# Patient Record
Sex: Female | Born: 1955 | Race: White | Hispanic: No | Smoking: Former smoker
Health system: Southern US, Community
[De-identification: ages and names within clinical notes are randomized; demographics above are authoritative.]

## PROBLEM LIST (undated history)

## (undated) DIAGNOSIS — Z8489 Family history of other specified conditions: Secondary | ICD-10-CM

## (undated) DIAGNOSIS — M545 Low back pain, unspecified: Secondary | ICD-10-CM

## (undated) DIAGNOSIS — L0390 Cellulitis, unspecified: Secondary | ICD-10-CM

## (undated) DIAGNOSIS — K219 Gastro-esophageal reflux disease without esophagitis: Secondary | ICD-10-CM

## (undated) DIAGNOSIS — I872 Venous insufficiency (chronic) (peripheral): Secondary | ICD-10-CM

## (undated) DIAGNOSIS — M79606 Pain in leg, unspecified: Secondary | ICD-10-CM

## (undated) DIAGNOSIS — I739 Peripheral vascular disease, unspecified: Secondary | ICD-10-CM

## (undated) DIAGNOSIS — E079 Disorder of thyroid, unspecified: Secondary | ICD-10-CM

## (undated) DIAGNOSIS — J45909 Unspecified asthma, uncomplicated: Secondary | ICD-10-CM

## (undated) DIAGNOSIS — M1990 Unspecified osteoarthritis, unspecified site: Secondary | ICD-10-CM

## (undated) DIAGNOSIS — Z9289 Personal history of other medical treatment: Secondary | ICD-10-CM

## (undated) DIAGNOSIS — I2699 Other pulmonary embolism without acute cor pulmonale: Secondary | ICD-10-CM

## (undated) DIAGNOSIS — D649 Anemia, unspecified: Secondary | ICD-10-CM

## (undated) DIAGNOSIS — F419 Anxiety disorder, unspecified: Secondary | ICD-10-CM

## (undated) DIAGNOSIS — E7211 Homocystinuria: Secondary | ICD-10-CM

## (undated) DIAGNOSIS — R7983 Abnormal findings of blood amino-acid level: Secondary | ICD-10-CM

## (undated) DIAGNOSIS — T403X5A Adverse effect of methadone, initial encounter: Secondary | ICD-10-CM

## (undated) DIAGNOSIS — K5730 Diverticulosis of large intestine without perforation or abscess without bleeding: Secondary | ICD-10-CM

## (undated) DIAGNOSIS — I82409 Acute embolism and thrombosis of unspecified deep veins of unspecified lower extremity: Secondary | ICD-10-CM

## (undated) DIAGNOSIS — F17200 Nicotine dependence, unspecified, uncomplicated: Secondary | ICD-10-CM

## (undated) DIAGNOSIS — D126 Benign neoplasm of colon, unspecified: Secondary | ICD-10-CM

## (undated) DIAGNOSIS — I959 Hypotension, unspecified: Secondary | ICD-10-CM

## (undated) DIAGNOSIS — E876 Hypokalemia: Secondary | ICD-10-CM

## (undated) DIAGNOSIS — G8929 Other chronic pain: Secondary | ICD-10-CM

## (undated) DIAGNOSIS — R32 Unspecified urinary incontinence: Secondary | ICD-10-CM

## (undated) DIAGNOSIS — I2510 Atherosclerotic heart disease of native coronary artery without angina pectoris: Secondary | ICD-10-CM

## (undated) DIAGNOSIS — I4510 Unspecified right bundle-branch block: Secondary | ICD-10-CM

## (undated) DIAGNOSIS — D479 Neoplasm of uncertain behavior of lymphoid, hematopoietic and related tissue, unspecified: Secondary | ICD-10-CM

## (undated) DIAGNOSIS — F32A Depression, unspecified: Secondary | ICD-10-CM

## (undated) DIAGNOSIS — F329 Major depressive disorder, single episode, unspecified: Secondary | ICD-10-CM

## (undated) DIAGNOSIS — S301XXA Contusion of abdominal wall, initial encounter: Secondary | ICD-10-CM

## (undated) HISTORY — DX: Venous insufficiency (chronic) (peripheral): I87.2

## (undated) HISTORY — DX: Disorder of thyroid, unspecified: E07.9

## (undated) HISTORY — DX: Hypokalemia: E87.6

## (undated) HISTORY — DX: Low back pain: M54.5

## (undated) HISTORY — DX: Homocystinuria: E72.11

## (undated) HISTORY — DX: Other chronic pain: G89.29

## (undated) HISTORY — PX: GASTRIC BYPASS: SHX52

## (undated) HISTORY — DX: Other pulmonary embolism without acute cor pulmonale: I26.99

## (undated) HISTORY — DX: Diverticulosis of large intestine without perforation or abscess without bleeding: K57.30

## (undated) HISTORY — DX: Cellulitis, unspecified: L03.90

## (undated) HISTORY — DX: Hypotension, unspecified: I95.9

## (undated) HISTORY — PX: CHOLECYSTECTOMY OPEN: SUR202

## (undated) HISTORY — DX: Contusion of abdominal wall, initial encounter: S30.1XXA

## (undated) HISTORY — DX: Unspecified right bundle-branch block: I45.10

## (undated) HISTORY — DX: Pain in leg, unspecified: M79.606

## (undated) HISTORY — DX: Adverse effect of methadone, initial encounter: T40.3X5A

## (undated) HISTORY — DX: Anemia, unspecified: D64.9

## (undated) HISTORY — DX: Nicotine dependence, unspecified, uncomplicated: F17.200

## (undated) HISTORY — DX: Low back pain, unspecified: M54.50

## (undated) HISTORY — DX: Abnormal findings of blood amino-acid level: R79.83

## (undated) HISTORY — DX: Neoplasm of uncertain behavior of lymphoid, hematopoietic and related tissue, unspecified: D47.9

## (undated) HISTORY — DX: Atherosclerotic heart disease of native coronary artery without angina pectoris: I25.10

## (undated) HISTORY — PX: JOINT REPLACEMENT: SHX530

## (undated) HISTORY — DX: Benign neoplasm of colon, unspecified: D12.6

## (undated) HISTORY — DX: Peripheral vascular disease, unspecified: I73.9

## (undated) HISTORY — PX: TONSILLECTOMY: SUR1361

---

## 1998-09-04 ENCOUNTER — Emergency Department (HOSPITAL_COMMUNITY): Admission: EM | Admit: 1998-09-04 | Discharge: 1998-09-04 | Payer: Self-pay | Admitting: Emergency Medicine

## 1999-06-21 ENCOUNTER — Emergency Department (HOSPITAL_COMMUNITY): Admission: EM | Admit: 1999-06-21 | Discharge: 1999-06-21 | Payer: Self-pay | Admitting: Internal Medicine

## 2000-01-18 ENCOUNTER — Encounter: Payer: Self-pay | Admitting: Emergency Medicine

## 2000-01-18 ENCOUNTER — Emergency Department (HOSPITAL_COMMUNITY): Admission: EM | Admit: 2000-01-18 | Discharge: 2000-01-18 | Payer: Self-pay | Admitting: Emergency Medicine

## 2000-02-29 ENCOUNTER — Other Ambulatory Visit: Admission: RE | Admit: 2000-02-29 | Discharge: 2000-02-29 | Payer: Self-pay | Admitting: Orthopedic Surgery

## 2000-03-25 ENCOUNTER — Emergency Department (HOSPITAL_COMMUNITY): Admission: EM | Admit: 2000-03-25 | Discharge: 2000-03-25 | Payer: Self-pay | Admitting: Emergency Medicine

## 2000-05-11 ENCOUNTER — Emergency Department (HOSPITAL_COMMUNITY): Admission: EM | Admit: 2000-05-11 | Discharge: 2000-05-12 | Payer: Self-pay | Admitting: Emergency Medicine

## 2000-05-12 ENCOUNTER — Ambulatory Visit (HOSPITAL_COMMUNITY): Admission: RE | Admit: 2000-05-12 | Discharge: 2000-05-12 | Payer: Self-pay | Admitting: *Deleted

## 2000-05-12 ENCOUNTER — Encounter: Payer: Self-pay | Admitting: Emergency Medicine

## 2000-05-12 ENCOUNTER — Encounter: Admission: RE | Admit: 2000-05-12 | Discharge: 2000-05-12 | Payer: Self-pay | Admitting: Internal Medicine

## 2000-06-05 ENCOUNTER — Encounter: Payer: Self-pay | Admitting: Orthopedic Surgery

## 2000-06-05 ENCOUNTER — Ambulatory Visit (HOSPITAL_COMMUNITY): Admission: RE | Admit: 2000-06-05 | Discharge: 2000-06-05 | Payer: Self-pay | Admitting: Orthopedic Surgery

## 2001-01-02 ENCOUNTER — Other Ambulatory Visit: Admission: RE | Admit: 2001-01-02 | Discharge: 2001-01-02 | Payer: Self-pay | Admitting: Gynecology

## 2001-01-19 ENCOUNTER — Other Ambulatory Visit: Admission: RE | Admit: 2001-01-19 | Discharge: 2001-01-19 | Payer: Self-pay | Admitting: Gynecology

## 2001-01-19 ENCOUNTER — Encounter (INDEPENDENT_AMBULATORY_CARE_PROVIDER_SITE_OTHER): Payer: Self-pay

## 2001-01-24 ENCOUNTER — Other Ambulatory Visit: Admission: RE | Admit: 2001-01-24 | Discharge: 2001-01-24 | Payer: Self-pay | Admitting: Gynecology

## 2001-01-24 ENCOUNTER — Encounter (INDEPENDENT_AMBULATORY_CARE_PROVIDER_SITE_OTHER): Payer: Self-pay

## 2001-02-11 ENCOUNTER — Emergency Department (HOSPITAL_COMMUNITY): Admission: EM | Admit: 2001-02-11 | Discharge: 2001-02-11 | Payer: Self-pay | Admitting: Emergency Medicine

## 2001-02-11 ENCOUNTER — Encounter: Payer: Self-pay | Admitting: Emergency Medicine

## 2001-02-12 ENCOUNTER — Encounter: Admission: RE | Admit: 2001-02-12 | Discharge: 2001-02-12 | Payer: Self-pay | Admitting: Family Medicine

## 2001-02-12 ENCOUNTER — Encounter: Payer: Self-pay | Admitting: Family Medicine

## 2001-05-08 HISTORY — PX: KNEE ARTHROSCOPY: SHX127

## 2001-05-31 ENCOUNTER — Ambulatory Visit (HOSPITAL_COMMUNITY): Admission: RE | Admit: 2001-05-31 | Discharge: 2001-05-31 | Payer: Self-pay | Admitting: Orthopedic Surgery

## 2002-03-30 ENCOUNTER — Emergency Department (HOSPITAL_COMMUNITY): Admission: EM | Admit: 2002-03-30 | Discharge: 2002-03-30 | Payer: Self-pay | Admitting: Emergency Medicine

## 2002-06-14 ENCOUNTER — Emergency Department (HOSPITAL_COMMUNITY): Admission: EM | Admit: 2002-06-14 | Discharge: 2002-06-14 | Payer: Self-pay | Admitting: Emergency Medicine

## 2002-07-06 ENCOUNTER — Emergency Department (HOSPITAL_COMMUNITY): Admission: EM | Admit: 2002-07-06 | Discharge: 2002-07-06 | Payer: Self-pay | Admitting: Emergency Medicine

## 2002-07-06 ENCOUNTER — Encounter: Payer: Self-pay | Admitting: Emergency Medicine

## 2002-08-08 HISTORY — PX: SHOULDER ARTHROSCOPY W/ ROTATOR CUFF REPAIR: SHX2400

## 2002-09-06 ENCOUNTER — Ambulatory Visit (HOSPITAL_BASED_OUTPATIENT_CLINIC_OR_DEPARTMENT_OTHER): Admission: RE | Admit: 2002-09-06 | Discharge: 2002-09-07 | Payer: Self-pay | Admitting: Orthopedic Surgery

## 2003-01-12 ENCOUNTER — Emergency Department (HOSPITAL_COMMUNITY): Admission: EM | Admit: 2003-01-12 | Discharge: 2003-01-12 | Payer: Self-pay | Admitting: Emergency Medicine

## 2003-02-05 ENCOUNTER — Other Ambulatory Visit: Admission: RE | Admit: 2003-02-05 | Discharge: 2003-02-05 | Payer: Self-pay | Admitting: Gynecology

## 2003-02-14 ENCOUNTER — Encounter: Payer: Self-pay | Admitting: Gynecology

## 2003-02-14 ENCOUNTER — Ambulatory Visit (HOSPITAL_COMMUNITY): Admission: RE | Admit: 2003-02-14 | Discharge: 2003-02-14 | Payer: Self-pay | Admitting: Gynecology

## 2003-10-03 ENCOUNTER — Emergency Department (HOSPITAL_COMMUNITY): Admission: EM | Admit: 2003-10-03 | Discharge: 2003-10-04 | Payer: Self-pay | Admitting: Emergency Medicine

## 2003-10-09 ENCOUNTER — Emergency Department (HOSPITAL_COMMUNITY): Admission: EM | Admit: 2003-10-09 | Discharge: 2003-10-09 | Payer: Self-pay | Admitting: Emergency Medicine

## 2003-12-01 ENCOUNTER — Emergency Department (HOSPITAL_COMMUNITY): Admission: EM | Admit: 2003-12-01 | Discharge: 2003-12-02 | Payer: Self-pay | Admitting: *Deleted

## 2004-11-02 ENCOUNTER — Emergency Department (HOSPITAL_COMMUNITY): Admission: EM | Admit: 2004-11-02 | Discharge: 2004-11-02 | Payer: Self-pay | Admitting: Family Medicine

## 2004-11-07 ENCOUNTER — Emergency Department (HOSPITAL_COMMUNITY): Admission: EM | Admit: 2004-11-07 | Discharge: 2004-11-07 | Payer: Self-pay | Admitting: Family Medicine

## 2004-12-08 ENCOUNTER — Other Ambulatory Visit: Admission: RE | Admit: 2004-12-08 | Discharge: 2004-12-08 | Payer: Self-pay | Admitting: Gynecology

## 2006-02-08 ENCOUNTER — Emergency Department (HOSPITAL_COMMUNITY): Admission: EM | Admit: 2006-02-08 | Discharge: 2006-02-08 | Payer: Self-pay | Admitting: Emergency Medicine

## 2006-08-08 DIAGNOSIS — I2699 Other pulmonary embolism without acute cor pulmonale: Secondary | ICD-10-CM

## 2006-08-08 HISTORY — DX: Other pulmonary embolism without acute cor pulmonale: I26.99

## 2007-05-09 HISTORY — PX: CARDIAC CATHETERIZATION: SHX172

## 2007-05-20 ENCOUNTER — Ambulatory Visit: Payer: Self-pay | Admitting: Cardiology

## 2007-05-21 ENCOUNTER — Ambulatory Visit: Payer: Self-pay | Admitting: Cardiology

## 2007-05-21 ENCOUNTER — Ambulatory Visit: Payer: Self-pay | Admitting: Internal Medicine

## 2007-05-21 ENCOUNTER — Inpatient Hospital Stay (HOSPITAL_COMMUNITY): Admission: EM | Admit: 2007-05-21 | Discharge: 2007-05-29 | Payer: Self-pay | Admitting: Cardiovascular Disease

## 2007-05-22 ENCOUNTER — Encounter (INDEPENDENT_AMBULATORY_CARE_PROVIDER_SITE_OTHER): Payer: Self-pay | Admitting: Internal Medicine

## 2007-05-22 ENCOUNTER — Ambulatory Visit: Payer: Self-pay | Admitting: Vascular Surgery

## 2007-07-13 ENCOUNTER — Ambulatory Visit: Payer: Self-pay | Admitting: Cardiology

## 2007-07-18 ENCOUNTER — Emergency Department (HOSPITAL_COMMUNITY): Admission: EM | Admit: 2007-07-18 | Discharge: 2007-07-19 | Payer: Self-pay | Admitting: Emergency Medicine

## 2007-07-19 ENCOUNTER — Ambulatory Visit: Payer: Self-pay | Admitting: Cardiology

## 2007-07-22 ENCOUNTER — Emergency Department (HOSPITAL_COMMUNITY): Admission: EM | Admit: 2007-07-22 | Discharge: 2007-07-22 | Payer: Self-pay | Admitting: Emergency Medicine

## 2007-07-26 ENCOUNTER — Ambulatory Visit: Payer: Self-pay | Admitting: Cardiology

## 2007-07-30 ENCOUNTER — Ambulatory Visit: Payer: Self-pay | Admitting: Cardiology

## 2007-07-30 LAB — CONVERTED CEMR LAB
INR: 1.2 — ABNORMAL HIGH (ref 0.8–1.0)
Prothrombin Time: 13.5 s — ABNORMAL HIGH (ref 10.9–13.3)

## 2007-08-07 ENCOUNTER — Ambulatory Visit: Payer: Self-pay | Admitting: Cardiology

## 2007-08-07 LAB — CONVERTED CEMR LAB
INR: 1.4 — ABNORMAL HIGH (ref 0.8–1.0)
Prothrombin Time: 14.3 s — ABNORMAL HIGH (ref 10.9–13.3)
Prothrombin Time: 14.3 s — ABNORMAL HIGH (ref 10.9–13.3)

## 2007-08-10 ENCOUNTER — Ambulatory Visit: Payer: Self-pay | Admitting: Pulmonary Disease

## 2007-08-10 ENCOUNTER — Ambulatory Visit: Payer: Self-pay | Admitting: Cardiology

## 2007-08-10 DIAGNOSIS — I959 Hypotension, unspecified: Secondary | ICD-10-CM | POA: Insufficient documentation

## 2007-08-10 DIAGNOSIS — F17200 Nicotine dependence, unspecified, uncomplicated: Secondary | ICD-10-CM | POA: Insufficient documentation

## 2007-08-10 DIAGNOSIS — J439 Emphysema, unspecified: Secondary | ICD-10-CM | POA: Insufficient documentation

## 2007-08-10 DIAGNOSIS — D649 Anemia, unspecified: Secondary | ICD-10-CM | POA: Insufficient documentation

## 2007-08-10 DIAGNOSIS — M545 Low back pain, unspecified: Secondary | ICD-10-CM | POA: Insufficient documentation

## 2007-08-10 DIAGNOSIS — R599 Enlarged lymph nodes, unspecified: Secondary | ICD-10-CM | POA: Insufficient documentation

## 2007-08-10 DIAGNOSIS — E876 Hypokalemia: Secondary | ICD-10-CM | POA: Insufficient documentation

## 2007-08-10 DIAGNOSIS — I951 Orthostatic hypotension: Secondary | ICD-10-CM | POA: Insufficient documentation

## 2007-08-10 DIAGNOSIS — Z86711 Personal history of pulmonary embolism: Secondary | ICD-10-CM | POA: Insufficient documentation

## 2007-08-10 LAB — CONVERTED CEMR LAB: INR: 1.4 — ABNORMAL HIGH (ref 0.8–1.0)

## 2007-08-13 ENCOUNTER — Ambulatory Visit: Payer: Self-pay | Admitting: Cardiology

## 2007-08-17 ENCOUNTER — Ambulatory Visit: Payer: Self-pay | Admitting: Cardiology

## 2007-08-31 ENCOUNTER — Ambulatory Visit: Payer: Self-pay | Admitting: Internal Medicine

## 2007-09-18 ENCOUNTER — Ambulatory Visit: Payer: Self-pay | Admitting: Cardiovascular Disease

## 2007-09-26 ENCOUNTER — Ambulatory Visit (HOSPITAL_COMMUNITY): Admission: RE | Admit: 2007-09-26 | Discharge: 2007-09-26 | Payer: Self-pay | Admitting: Pulmonary Disease

## 2007-10-03 ENCOUNTER — Ambulatory Visit: Payer: Self-pay | Admitting: Internal Medicine

## 2007-10-15 ENCOUNTER — Ambulatory Visit: Payer: Self-pay | Admitting: Cardiology

## 2007-10-15 ENCOUNTER — Ambulatory Visit: Payer: Self-pay | Admitting: Pulmonary Disease

## 2007-11-01 ENCOUNTER — Ambulatory Visit: Payer: Self-pay | Admitting: Internal Medicine

## 2007-11-07 ENCOUNTER — Other Ambulatory Visit: Admission: RE | Admit: 2007-11-07 | Discharge: 2007-11-07 | Payer: Self-pay | Admitting: Gynecology

## 2007-11-07 ENCOUNTER — Ambulatory Visit: Payer: Self-pay | Admitting: Internal Medicine

## 2007-11-15 ENCOUNTER — Ambulatory Visit: Payer: Self-pay | Admitting: Internal Medicine

## 2007-11-26 ENCOUNTER — Ambulatory Visit: Payer: Self-pay | Admitting: Cardiology

## 2007-11-29 ENCOUNTER — Ambulatory Visit: Payer: Self-pay | Admitting: Gastroenterology

## 2007-11-29 LAB — CONVERTED CEMR LAB
ALT: 19 units/L (ref 0–35)
Alkaline Phosphatase: 61 units/L (ref 39–117)
Bilirubin, Direct: 0.1 mg/dL (ref 0.0–0.3)
Total Bilirubin: 0.6 mg/dL (ref 0.3–1.2)

## 2007-12-07 DIAGNOSIS — D126 Benign neoplasm of colon, unspecified: Secondary | ICD-10-CM

## 2007-12-07 HISTORY — DX: Benign neoplasm of colon, unspecified: D12.6

## 2007-12-10 ENCOUNTER — Ambulatory Visit: Payer: Self-pay | Admitting: Internal Medicine

## 2007-12-18 ENCOUNTER — Ambulatory Visit: Payer: Self-pay | Admitting: Cardiovascular Disease

## 2007-12-21 ENCOUNTER — Emergency Department (HOSPITAL_COMMUNITY): Admission: EM | Admit: 2007-12-21 | Discharge: 2007-12-21 | Payer: Self-pay | Admitting: Emergency Medicine

## 2007-12-24 ENCOUNTER — Telehealth: Payer: Self-pay | Admitting: Gastroenterology

## 2007-12-25 ENCOUNTER — Ambulatory Visit: Payer: Self-pay | Admitting: Gastroenterology

## 2007-12-25 ENCOUNTER — Encounter: Payer: Self-pay | Admitting: Gastroenterology

## 2007-12-27 ENCOUNTER — Encounter: Payer: Self-pay | Admitting: Gastroenterology

## 2008-01-01 ENCOUNTER — Inpatient Hospital Stay (HOSPITAL_COMMUNITY): Admission: EM | Admit: 2008-01-01 | Discharge: 2008-01-02 | Payer: Self-pay | Admitting: Emergency Medicine

## 2008-01-01 ENCOUNTER — Ambulatory Visit: Payer: Self-pay | Admitting: Cardiology

## 2008-01-03 ENCOUNTER — Telehealth: Payer: Self-pay | Admitting: Pulmonary Disease

## 2008-01-04 ENCOUNTER — Ambulatory Visit: Payer: Self-pay | Admitting: Cardiology

## 2008-01-07 ENCOUNTER — Inpatient Hospital Stay (HOSPITAL_COMMUNITY): Admission: RE | Admit: 2008-01-07 | Discharge: 2008-01-11 | Payer: Self-pay | Admitting: Orthopedic Surgery

## 2008-01-07 DIAGNOSIS — Z9289 Personal history of other medical treatment: Secondary | ICD-10-CM

## 2008-01-07 HISTORY — DX: Personal history of other medical treatment: Z92.89

## 2008-01-07 HISTORY — PX: TOTAL HIP ARTHROPLASTY: SHX124

## 2008-01-15 ENCOUNTER — Telehealth: Payer: Self-pay | Admitting: Gastroenterology

## 2008-02-12 ENCOUNTER — Ambulatory Visit: Payer: Self-pay | Admitting: Cardiovascular Disease

## 2008-02-27 ENCOUNTER — Ambulatory Visit: Payer: Self-pay | Admitting: Internal Medicine

## 2008-03-05 ENCOUNTER — Ambulatory Visit: Payer: Self-pay | Admitting: Cardiology

## 2008-03-14 ENCOUNTER — Ambulatory Visit: Payer: Self-pay | Admitting: Cardiology

## 2008-03-24 ENCOUNTER — Ambulatory Visit: Payer: Self-pay | Admitting: Cardiology

## 2008-04-17 ENCOUNTER — Ambulatory Visit: Payer: Self-pay | Admitting: Cardiology

## 2008-04-24 ENCOUNTER — Ambulatory Visit: Payer: Self-pay | Admitting: Cardiology

## 2008-05-08 ENCOUNTER — Ambulatory Visit: Payer: Self-pay | Admitting: Internal Medicine

## 2008-05-28 ENCOUNTER — Ambulatory Visit: Payer: Self-pay | Admitting: Cardiology

## 2008-06-17 ENCOUNTER — Ambulatory Visit: Payer: Self-pay | Admitting: Internal Medicine

## 2008-07-25 ENCOUNTER — Ambulatory Visit: Payer: Self-pay | Admitting: Cardiovascular Disease

## 2008-08-20 ENCOUNTER — Emergency Department (HOSPITAL_COMMUNITY): Admission: EM | Admit: 2008-08-20 | Discharge: 2008-08-20 | Payer: Self-pay | Admitting: Emergency Medicine

## 2008-10-22 ENCOUNTER — Ambulatory Visit: Payer: Self-pay | Admitting: Cardiology

## 2008-10-22 ENCOUNTER — Ambulatory Visit: Payer: Self-pay

## 2008-10-22 ENCOUNTER — Encounter (INDEPENDENT_AMBULATORY_CARE_PROVIDER_SITE_OTHER): Payer: Self-pay | Admitting: Orthopedic Surgery

## 2008-11-09 ENCOUNTER — Emergency Department (HOSPITAL_COMMUNITY): Admission: EM | Admit: 2008-11-09 | Discharge: 2008-11-09 | Payer: Self-pay | Admitting: Emergency Medicine

## 2008-11-28 ENCOUNTER — Ambulatory Visit: Payer: Self-pay | Admitting: Cardiology

## 2008-12-12 ENCOUNTER — Ambulatory Visit: Payer: Self-pay | Admitting: Cardiovascular Disease

## 2008-12-12 LAB — CONVERTED CEMR LAB: INR: 7 (ref 0.8–1.0)

## 2008-12-19 ENCOUNTER — Ambulatory Visit: Payer: Self-pay | Admitting: Cardiology

## 2008-12-26 ENCOUNTER — Ambulatory Visit: Payer: Self-pay | Admitting: Cardiology

## 2009-01-07 ENCOUNTER — Encounter: Payer: Self-pay | Admitting: *Deleted

## 2009-01-09 ENCOUNTER — Ambulatory Visit: Payer: Self-pay | Admitting: Cardiology

## 2009-01-19 ENCOUNTER — Ambulatory Visit: Payer: Self-pay | Admitting: Cardiology

## 2009-01-19 LAB — CONVERTED CEMR LAB
POC INR: 1.5
Protime: 15

## 2009-01-26 ENCOUNTER — Ambulatory Visit: Payer: Self-pay | Admitting: Internal Medicine

## 2009-01-26 ENCOUNTER — Encounter (INDEPENDENT_AMBULATORY_CARE_PROVIDER_SITE_OTHER): Payer: Self-pay | Admitting: *Deleted

## 2009-01-26 LAB — CONVERTED CEMR LAB: POC INR: 3.3

## 2009-02-09 ENCOUNTER — Emergency Department (HOSPITAL_COMMUNITY): Admission: EM | Admit: 2009-02-09 | Discharge: 2009-02-10 | Payer: Self-pay | Admitting: Emergency Medicine

## 2009-02-10 ENCOUNTER — Ambulatory Visit: Payer: Self-pay | Admitting: Cardiology

## 2009-02-10 ENCOUNTER — Encounter (INDEPENDENT_AMBULATORY_CARE_PROVIDER_SITE_OTHER): Payer: Self-pay | Admitting: Cardiology

## 2009-02-10 LAB — CONVERTED CEMR LAB: POC INR: 2.4

## 2009-02-11 ENCOUNTER — Encounter: Payer: Self-pay | Admitting: *Deleted

## 2009-03-04 ENCOUNTER — Ambulatory Visit: Payer: Self-pay | Admitting: Cardiology

## 2009-03-18 ENCOUNTER — Ambulatory Visit: Payer: Self-pay | Admitting: Internal Medicine

## 2009-03-18 ENCOUNTER — Encounter (INDEPENDENT_AMBULATORY_CARE_PROVIDER_SITE_OTHER): Payer: Self-pay | Admitting: Cardiology

## 2009-03-18 LAB — CONVERTED CEMR LAB: POC INR: 5

## 2009-04-01 ENCOUNTER — Ambulatory Visit: Payer: Self-pay | Admitting: Internal Medicine

## 2009-04-01 LAB — CONVERTED CEMR LAB: POC INR: 2.2

## 2009-04-15 ENCOUNTER — Ambulatory Visit: Payer: Self-pay | Admitting: Cardiology

## 2009-04-15 LAB — CONVERTED CEMR LAB: POC INR: 4.2

## 2009-05-11 ENCOUNTER — Ambulatory Visit: Payer: Self-pay | Admitting: Internal Medicine

## 2009-05-11 LAB — CONVERTED CEMR LAB: POC INR: 2.1

## 2009-05-25 ENCOUNTER — Ambulatory Visit: Payer: Self-pay | Admitting: Cardiovascular Disease

## 2009-05-25 LAB — CONVERTED CEMR LAB: POC INR: 2.7

## 2009-06-15 ENCOUNTER — Ambulatory Visit: Payer: Self-pay | Admitting: Internal Medicine

## 2009-06-15 LAB — CONVERTED CEMR LAB: POC INR: 2.5

## 2009-07-06 ENCOUNTER — Ambulatory Visit: Payer: Self-pay | Admitting: Cardiology

## 2009-07-06 LAB — CONVERTED CEMR LAB: POC INR: 2.2

## 2009-08-12 ENCOUNTER — Ambulatory Visit: Payer: Self-pay | Admitting: Cardiovascular Disease

## 2009-08-12 LAB — CONVERTED CEMR LAB: POC INR: 2.7

## 2009-09-09 ENCOUNTER — Ambulatory Visit: Payer: Self-pay | Admitting: Internal Medicine

## 2009-10-12 ENCOUNTER — Ambulatory Visit: Payer: Self-pay | Admitting: Internal Medicine

## 2009-11-16 ENCOUNTER — Ambulatory Visit: Payer: Self-pay | Admitting: Cardiovascular Disease

## 2009-11-16 LAB — CONVERTED CEMR LAB: POC INR: 3.3

## 2009-12-07 ENCOUNTER — Ambulatory Visit: Payer: Self-pay | Admitting: Cardiology

## 2010-01-14 ENCOUNTER — Ambulatory Visit: Payer: Self-pay | Admitting: Internal Medicine

## 2010-01-18 ENCOUNTER — Encounter: Payer: Self-pay | Admitting: Physician Assistant

## 2010-01-18 ENCOUNTER — Ambulatory Visit: Payer: Self-pay | Admitting: Cardiology

## 2010-01-18 DIAGNOSIS — R600 Localized edema: Secondary | ICD-10-CM

## 2010-01-18 DIAGNOSIS — I890 Lymphedema, not elsewhere classified: Secondary | ICD-10-CM | POA: Insufficient documentation

## 2010-01-18 DIAGNOSIS — R252 Cramp and spasm: Secondary | ICD-10-CM | POA: Insufficient documentation

## 2010-01-19 DIAGNOSIS — E039 Hypothyroidism, unspecified: Secondary | ICD-10-CM | POA: Insufficient documentation

## 2010-01-20 LAB — CONVERTED CEMR LAB
ALT: 10 units/L (ref 0–35)
AST: 15 units/L (ref 0–37)
Albumin: 3.4 g/dL — ABNORMAL LOW (ref 3.5–5.2)
Alkaline Phosphatase: 68 units/L (ref 39–117)
BUN: 4 mg/dL — ABNORMAL LOW (ref 6–23)
Basophils Absolute: 0 10*3/uL (ref 0.0–0.1)
CO2: 34 meq/L — ABNORMAL HIGH (ref 19–32)
Chloride: 106 meq/L (ref 96–112)
GFR calc non Af Amer: 112.03 mL/min (ref >60)
Glucose, Bld: 77 mg/dL (ref 70–99)
HCT: 34.5 % — ABNORMAL LOW (ref 36.0–46.0)
Lymphs Abs: 1.3 10*3/uL (ref 0.7–4.0)
Monocytes Absolute: 0.3 10*3/uL (ref 0.1–1.0)
Monocytes Relative: 6.4 % (ref 3.0–12.0)
Neutrophils Relative %: 59.8 % (ref 43.0–77.0)
Platelets: 206 10*3/uL (ref 150.0–400.0)
Potassium: 4 meq/L (ref 3.5–5.1)
RDW: 14.8 % — ABNORMAL HIGH (ref 11.5–14.6)
Sodium: 144 meq/L (ref 135–145)

## 2010-01-22 ENCOUNTER — Ambulatory Visit: Payer: Self-pay | Admitting: Cardiology

## 2010-01-22 ENCOUNTER — Ambulatory Visit: Payer: Self-pay

## 2010-01-22 LAB — CONVERTED CEMR LAB: TSH: 7.53 microintl units/mL — ABNORMAL HIGH (ref 0.35–5.50)

## 2010-01-28 ENCOUNTER — Ambulatory Visit: Payer: Self-pay | Admitting: Internal Medicine

## 2010-01-28 LAB — CONVERTED CEMR LAB: POC INR: 1.5

## 2010-02-09 ENCOUNTER — Telehealth (INDEPENDENT_AMBULATORY_CARE_PROVIDER_SITE_OTHER): Payer: Self-pay | Admitting: *Deleted

## 2010-02-11 ENCOUNTER — Ambulatory Visit: Payer: Self-pay | Admitting: Cardiology

## 2010-02-11 ENCOUNTER — Ambulatory Visit: Payer: Self-pay | Admitting: Internal Medicine

## 2010-02-11 DIAGNOSIS — E0590 Thyrotoxicosis, unspecified without thyrotoxic crisis or storm: Secondary | ICD-10-CM | POA: Insufficient documentation

## 2010-02-17 ENCOUNTER — Ambulatory Visit: Payer: Self-pay | Admitting: Cardiology

## 2010-03-04 ENCOUNTER — Ambulatory Visit: Payer: Self-pay | Admitting: Internal Medicine

## 2010-03-25 ENCOUNTER — Ambulatory Visit: Payer: Self-pay | Admitting: Cardiovascular Disease

## 2010-03-25 LAB — CONVERTED CEMR LAB: POC INR: 3.3

## 2010-04-15 ENCOUNTER — Ambulatory Visit: Payer: Self-pay | Admitting: Internal Medicine

## 2010-04-15 LAB — CONVERTED CEMR LAB: POC INR: 2.6

## 2010-05-13 ENCOUNTER — Ambulatory Visit: Payer: Self-pay | Admitting: Internal Medicine

## 2010-06-10 ENCOUNTER — Ambulatory Visit: Payer: Self-pay | Admitting: Cardiology

## 2010-06-10 ENCOUNTER — Encounter: Payer: Self-pay | Admitting: Cardiology

## 2010-06-10 LAB — CONVERTED CEMR LAB: POC INR: 3.8

## 2010-06-23 ENCOUNTER — Encounter: Payer: Self-pay | Admitting: Cardiology

## 2010-06-24 ENCOUNTER — Ambulatory Visit: Payer: Self-pay | Admitting: Cardiology

## 2010-06-24 ENCOUNTER — Ambulatory Visit: Payer: Self-pay | Admitting: Cardiovascular Disease

## 2010-06-24 LAB — CONVERTED CEMR LAB: POC INR: 2.5

## 2010-06-25 ENCOUNTER — Emergency Department (HOSPITAL_COMMUNITY): Admission: EM | Admit: 2010-06-25 | Discharge: 2010-06-26 | Payer: Self-pay | Admitting: Emergency Medicine

## 2010-06-29 ENCOUNTER — Ambulatory Visit: Payer: Self-pay | Admitting: Cardiovascular Disease

## 2010-06-29 LAB — CONVERTED CEMR LAB: POC INR: 2.9

## 2010-07-27 ENCOUNTER — Ambulatory Visit: Payer: Self-pay | Admitting: Cardiology

## 2010-07-27 LAB — CONVERTED CEMR LAB: POC INR: 3.3

## 2010-08-17 ENCOUNTER — Ambulatory Visit: Admission: RE | Admit: 2010-08-17 | Discharge: 2010-08-17 | Payer: Self-pay | Source: Home / Self Care

## 2010-08-31 ENCOUNTER — Ambulatory Visit: Admit: 2010-08-31 | Payer: Self-pay

## 2010-09-05 LAB — CONVERTED CEMR LAB
CK-MB: 1 ng/mL (ref 0.3–4.0)
Free T4: 0.86 ng/dL (ref 0.80–1.80)
Prothrombin Time: 64.7 s (ref 10.9–13.3)

## 2010-09-09 NOTE — Medication Information (Signed)
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 no    Recent/future dental: no  Any missed doses?: no       Is patient compliant with meds? yes       Allergies: 1)  ! * Narcan 2)  ! Effexor 3)  ! Morphine  Anticoagulation Management History:      The patient is taking warfarin and comes in today for a routine follow up visit.  Negative risk factors for bleeding include an age less than 74 years old.  The bleeding index is 'low risk'.  Negative CHADS2 values include Age > 71 years old.  The start date was 07/09/2007.  Her last INR was 6.7 ratio.  Anticoagulation responsible provider: Daleen Squibb MD, Maisie Fus.  INR POC: 3.3.  Cuvette Lot#: [id number].  Exp: 08/2011.    Anticoagulation Management Assessment/Plan:      The patient's current anticoagulation dose is Warfarin sodium 5 mg tabs: Use as directed by Anticoagulation Clinic.  The target INR is 2.0-3.0.  The next INR is due 08/17/2010.  Anticoagulation instructions were given to patient.  Results were reviewed/authorized by Bethena Midget, RN, BSN.  She was notified by Bethena Midget, RN, BSN.         Prior Anticoagulation Instructions: INR 2.9 Continue previous dose of 2 tablets everyday except 1 tablet on Monday and Friday Recheck INR in 4 weeks  Current Anticoagulation Instructions: INR 3.3 Today take 1 pill, then resume 2 pills everyday except 1 pill on Mondays and Fridays. Recheck in 3 weeks.

## 2010-09-09 NOTE — Medication Information (Signed)
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 no    Recent/future dental: no  Any missed doses?: no       Is patient compliant with meds? yes       Allergies: 1)  ! * Narcan 2)  ! Effexor 3)  ! Morphine  Anticoagulation Management History:      The patient is taking warfarin and comes in today for a routine follow up visit.  Negative risk factors for bleeding include an age less than 90 years old.  The bleeding index is 'low risk'.  Negative CHADS2 values include Age > 75 years old.  The start date was 07/09/2007.  Her last INR was 6.7 ratio.  Anticoagulation responsible provider: Juanda Chance MD, Smitty Cords.  INR POC: 3.5.  Cuvette Lot#: [id number].  Exp: 01/2011.    Anticoagulation Management Assessment/Plan:      The patient's current anticoagulation dose is Warfarin sodium 5 mg tabs: Use as directed by Anticoagulation Clinic.  The target INR is 2.0-3.0.  The next INR is due 12/28/2009.  Anticoagulation instructions were given to patient.  Results were reviewed/authorized by Eda Keys, PharmD.  She was notified by Eda Keys.         Prior Anticoagulation Instructions: INR 3.3  Hold today's dose. Then resume same dose of 2 tablets daily (10 mg) except 1 tablet on Mondays and Fridays.   Recheck in 3 weeks: Monday, May 2nd at 12:00 pm.   Current Anticoagulation Instructions: INR 3.5  Do NOT take coumadin today.  Then return to normal dosing schedule of 1 tablet on Monday and Friday and 2 tablets all other days.  Return to clinic in 3 weeks.   Prescriptions: WARFARIN SODIUM 5 MG TABS  (WARFARIN SODIUM) Use as directed by Anticoagulation Clinic  #60 Tablet x 2   Entered by:   Eda Keys   Authorized by:   Talitha Givens, MD, [facility]   Signed by:   Eda Keys on 12/07/2009   Method used:   Electronically to        [facility] (retail)       [address]       [place], [place]  [address]       Ph: [phone] or [phone]       Fax: [phone]   RxID:   [id number]

## 2010-09-09 NOTE — Medication Information (Signed)
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  Has been present since last week.   Recent/future hospitalizations: no    Any changes in medication regimen? yes       Details: Pt took Noroc  Recent/future dental: no  Any missed doses?: no       Is patient compliant with meds? yes       Allergies: 1)  ! * Narcan 2)  ! Effexor 3)  ! Morphine  Anticoagulation Management History:      The patient is taking warfarin and comes in today for a routine follow up visit.  Negative risk factors for bleeding include an age less than 4 years old.  The bleeding index is 'low risk'.  Negative CHADS2 values include Age > 84 years old.  The start date was 07/09/2007.  Her last INR was 6.7 ratio.  Anticoagulation responsible provider: Excell Seltzer MD, Casimiro Needle.  INR POC: 3.3.  Cuvette Lot#: [id number].  Exp: 05/2011.    Anticoagulation Management Assessment/Plan:      The patient's current anticoagulation dose is Warfarin sodium 5 mg tabs: Use as directed by Anticoagulation Clinic.  The target INR is 2.0-3.0.  The next INR is due 04/15/2010.  Anticoagulation instructions were given to patient.  Results were reviewed/authorized by Weston Brass, PharmD.  She was notified by Gweneth Fritter, PharmD Candidate.         Prior Anticoagulation Instructions: INR 2.5  Continue same dose of 2 tablet every day except 1 tablet on Monday and Friday.  Recheck INR in 3 weeks.   Current Anticoagulation Instructions: INR  Skip today's dose completely.  Then resume normal Couamdin Schedule of taking 2 tablets (10mg ) every day except take 1  tablet (5mg ) on Mondays and Fridays.  Recheck in 3 weeks.    Appended Document: rov/sp Informed pt to contact PCP if she continues to have bright red blood from rectum.  Also informed her that unfortunately, she cannot donate blood while on Coumadin Therapy.

## 2010-09-09 NOTE — Medication Information (Signed)
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 no    Recent/future dental: no  Any missed doses?: no       Is patient compliant with meds? yes      Comments: On 06/29/10 having Endometrial BX with Dr Chevis Pretty. INR only needs to be in range day of procedure. Saw Dr Myrtis Ser today  Allergies: 1)  ! * Narcan 2)  ! Effexor 3)  ! Morphine  Anticoagulation Management History:      The patient is taking warfarin and comes in today for a routine follow up visit.  Negative risk factors for bleeding include an age less than 41 years old.  The bleeding index is 'low risk'.  Negative CHADS2 values include Age > 12 years old.  The start date was 07/09/2007.  Her last INR was 6.7 ratio.  Anticoagulation responsible provider: Clifton James MD, Cristal Deer.  INR POC: 2.5.  Cuvette Lot#: [id number].  Exp: 06/2011.    Anticoagulation Management Assessment/Plan:      The patient's current anticoagulation dose is Warfarin sodium 5 mg tabs: Use as directed by Anticoagulation Clinic.  The target INR is 2.0-3.0.  The next INR is due 06/29/2010.  Anticoagulation instructions were given to patient.  Results were reviewed/authorized by Bethena Midget, RN, BSN.  She was notified by Bethena Midget, RN, BSN.         Prior Anticoagulation Instructions: INR 3.8 Skip today's dose then resume 2 pills everyday except 1 pill on Mondays and Fridays. Recheck in 2 weeks.   Current Anticoagulation Instructions: INR 2.5 Continue 2 pills everyday except 1 pill on Mondays and Fridays.  Recheck in one week on 06/29/10  before procedure.

## 2010-09-09 NOTE — Progress Notes (Signed)
Summary: nose bleeding   Phone Note Call from Patient Call back at [facility] Phone [phone]   Caller: Patient Summary of Call:  Pt nose bleeding  Initial call taken by: Judie Grieve,  February 09, 2010 11:56 AM  Follow-up for Phone Call        Pt states this occured off and on yesterday. She states it wasn't pouring out but just occ. dripping. Encouraged ice pack, pressure and vaseline in nasal membrane if it returns. keep appt for Thursday.  Follow-up by: Bethena Midget, RN, BSN,  February 09, 2010 3:33 PM

## 2010-09-09 NOTE — Medication Information (Signed)
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 no    Recent/future dental: no  Any missed doses?: no       Is patient compliant with meds? yes       Allergies: 1)  ! * Narcan 2)  ! Effexor 3)  ! Morphine  Anticoagulation Management History:      The patient is taking warfarin and comes in today for a routine follow up visit.  Negative risk factors for bleeding include an age less than 94 years old.  The bleeding index is 'low risk'.  Negative CHADS2 values include Age > 110 years old.  The start date was 07/09/2007.  Her last INR was 6.7 ratio.  Anticoagulation responsible provider: Allred MD, Fayrene Fearing.  INR POC: 2.7.  Cuvette Lot#: [id number].  Exp: 06/2011.    Anticoagulation Management Assessment/Plan:      The patient's current anticoagulation dose is Warfarin sodium 5 mg tabs: Use as directed by Anticoagulation Clinic.  The target INR is 2.0-3.0.  The next INR is due 06/10/2010.  Anticoagulation instructions were given to patient.  Results were reviewed/authorized by Weston Brass, PharmD.  She was notified by Ilean Skill D candidate.         Prior Anticoagulation Instructions: INR 2.6  Continue taking 2 tablets everyday except take 1 tablet on Mondays and Fridays. Re-check INR in 4 weeks.   Current Anticoagulation Instructions: INR 2.7  Continue taking 2 tablets everyday except 1 tablet on Monday and Friday. Recheck in 4 weeks.

## 2010-09-09 NOTE — Medication Information (Signed)
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 no    Recent/future dental: no  Any missed doses?: no       Is patient compliant with meds? yes       Allergies: 1)  ! * Narcan 2)  ! Effexor 3)  ! Morphine  Anticoagulation Management History:      The patient is taking warfarin and comes in today for a routine follow up visit.  Negative risk factors for bleeding include an age less than 25 years old.  The bleeding index is 'low risk'.  Negative CHADS2 values include Age > 27 years old.  The start date was 07/09/2007.  Her last INR was 6.7 ratio.  Anticoagulation responsible provider: Shan Valdes MD, Reuel Boom.  INR POC: 2.5.  Cuvette Lot#: [id number].  Exp: 05/2011.    Anticoagulation Management Assessment/Plan:      The patient's current anticoagulation dose is Warfarin sodium 5 mg tabs: Use as directed by Anticoagulation Clinic.  The target INR is 2.0-3.0.  The next INR is due 03/25/2010.  Anticoagulation instructions were given to patient.  Results were reviewed/authorized by Weston Brass, PharmD.  She was notified by Weston Brass PharmD.         Prior Anticoagulation Instructions: INR 3.2 Today only take 1 pill then resume 2 pills everyday except 1 pill on Mondays and Fridays. Recheck in 3 weeks.   Current Anticoagulation Instructions: INR 2.5  Continue same dose of 2 tablet every day except 1 tablet on Monday and Friday.  Recheck INR in 3 weeks.   Prescriptions: WARFARIN SODIUM 5 MG TABS (WARFARIN SODIUM) Use as directed by Anticoagulation Clinic  #60 Tablet x 2   Entered by:   Weston Brass PharmD   Authorized by:   Talitha Givens, MD, [facility]   Signed by:   Weston Brass PharmD on 03/04/2010   Method used:   Electronically to        [facility] [address] (retail)       [address]       [place], [place]  [id number]       Ph: [phone]       Fax: [phone]   RxID:   [id number]

## 2010-09-09 NOTE — Medication Information (Signed)
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 no    Recent/future dental: no  Any missed doses?: no       Is patient compliant with meds? yes       Allergies: 1)  ! * Narcan 2)  ! Effexor 3)  ! Morphine  Anticoagulation Management History:      The patient is taking warfarin and comes in today for a routine follow up visit.  Negative risk factors for bleeding include an age less than 71 years old.  The bleeding index is 'low risk'.  Negative CHADS2 values include Age > 62 years old.  The start date was 07/09/2007.  Her last INR was 6.7 ratio.  Anticoagulation responsible provider: Ladona Ridgel MD, Sharlot Gowda.  INR POC: 3.2.  Cuvette Lot#: [id number].  Exp: 09/20212.    Anticoagulation Management Assessment/Plan:      The patient's current anticoagulation dose is Warfarin sodium 5 mg tabs: Use as directed by Anticoagulation Clinic.  The target INR is 2.0-3.0.  The next INR is due 03/04/2010.  Anticoagulation instructions were given to patient.  Results were reviewed/authorized by Bethena Midget, RN, BSN.  She was notified by Bethena Midget, RN, BSN.         Prior Anticoagulation Instructions: INR 1.5 Today take 3 pills then change dose to 2 pills everyday except 1 pill on Mondays and Fridays. Recheck in 2 weeks.   Current Anticoagulation Instructions: INR 3.2 Today only take 1 pill then resume 2 pills everyday except 1 pill on Mondays and Fridays. Recheck in 3 weeks.

## 2010-09-09 NOTE — Medication Information (Signed)
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 no    Recent/future dental: no  Any missed doses?: no       Is patient compliant with meds? yes       Allergies: 1)  ! * Narcan 2)  ! Effexor 3)  ! Morphine  Anticoagulation Management History:      The patient is taking warfarin and comes in today for a routine follow up visit.  Negative risk factors for bleeding include an age less than 25 years old.  The bleeding index is 'low risk'.  Negative CHADS2 values include Age > 19 years old.  The start date was 07/09/2007.  Her last INR was 6.7 ratio.  Anticoagulation responsible provider: Clifton James MD, Cristal Deer.  INR POC: 2.9.  Cuvette Lot#: [id number].  Exp: 07/2011.    Anticoagulation Management Assessment/Plan:      The patient's current anticoagulation dose is Warfarin sodium 5 mg tabs: Use as directed by Anticoagulation Clinic.  The target INR is 2.0-3.0.  The next INR is due 07/27/2010.  Anticoagulation instructions were given to patient.  Results were reviewed/authorized by Weston Brass, PharmD.  She was notified by Hoy Register, PharmD Canddiate.         Prior Anticoagulation Instructions: INR 2.5 Continue 2 pills everyday except 1 pill on Mondays and Fridays.  Recheck in one week on 06/29/10 before procedure.   Current Anticoagulation Instructions: INR 2.9 Continue previous dose of 2 tablets everyday except 1 tablet on Monday and Friday Recheck INR in 4 weeks

## 2010-09-09 NOTE — Medication Information (Signed)
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  She does have some SOB.  Both are relieved when she is able to prop her feet up.  Set up an appt with PA for Monday.   Bleeding/hemorrhagic complications: no    Recent/future hospitalizations: no    Any changes in medication regimen? no    Recent/future dental: no  Any missed doses?: yes     Details: missed 4 days a few weeks ago because out of medication   Is patient compliant with meds? yes       Allergies: 1)  ! * Narcan 2)  ! Effexor 3)  ! Morphine  Anticoagulation Management History:      The patient is taking warfarin and comes in today for a routine follow up visit.  Negative risk factors for bleeding include an age less than 78 years old.  The bleeding index is 'low risk'.  Negative CHADS2 values include Age > 35 years old.  The start date was 07/09/2007.  Her last INR was 6.7 ratio.  Anticoagulation responsible provider: Tenny Craw MD, Gunnar Fusi.  INR POC: 3.7.  Cuvette Lot#: [id number].  Exp: 03/2011.    Anticoagulation Management Assessment/Plan:      The patient's current anticoagulation dose is Warfarin sodium 5 mg tabs: Use as directed by Anticoagulation Clinic.  The target INR is 2.0-3.0.  The next INR is due 01/28/2010.  Anticoagulation instructions were given to patient.  Results were reviewed/authorized by Weston Brass, PharmD.  She was notified by Weston Brass PharmD.         Prior Anticoagulation Instructions: INR 3.5  Do NOT take coumadin today.  Then return to normal dosing schedule of 1 tablet on Monday and Friday and 2 tablets  all other days.  Return to clinic in 3 weeks.    Current Anticoagulation Instructions: INR 3.7  Skip today's dose of Coumadin then decrease dose to 2 tablets every day except 1 tablet on Monday, Wednesday and Friday.

## 2010-09-09 NOTE — Medication Information (Signed)
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 Triage nurse called and assessed pt   Bleeding/hemorrhagic complications: no    Recent/future hospitalizations: no    Any changes in medication regimen? yes       Details: Using Tylenol PRN  Recent/future dental: no  Any missed doses?: no       Is patient compliant with meds? yes       Allergies: 1)  ! * Narcan 2)  ! Effexor 3)  ! Morphine  Anticoagulation Management History:      The patient is taking warfarin and comes in today for a routine follow up visit.  Negative risk factors for bleeding include an age less than 94 years old.  The bleeding index is 'low risk'.  Negative CHADS2 values include Age > 66 years old.  The start date was 07/09/2007.  Her last INR was 6.7 ratio.  Anticoagulation responsible provider: Daleen Squibb MD, Maisie Fus.  INR POC: 3.8.  Cuvette Lot#: [id number].  Exp: 07/2011.    Anticoagulation Management Assessment/Plan:      The patient's current anticoagulation dose is Warfarin sodium 5 mg tabs: Use as directed by Anticoagulation Clinic.  The target INR is 2.0-3.0.  The next INR is due 06/24/2010.  Anticoagulation instructions were given to patient.  Results were reviewed/authorized by Bethena Midget, RN, BSN.  She was notified by Bethena Midget, RN, BSN.         Prior Anticoagulation Instructions: INR 2.7  Continue taking 2 tablets everyday except 1 tablet on Monday and Friday. Recheck in 4 weeks.   Current Anticoagulation Instructions: INR 3.8 Skip today's dose then resume 2 pills everyday except 1 pill on Mondays and Fridays. Recheck  in 2 weeks.

## 2010-09-09 NOTE — Miscellaneous (Signed)
  Clinical Lists Changes  Problems: Added new problem of * METHADONE TREATMENT Observations: Added new observation of PAST MED HX:  PULMONARY EMBOLISM (ICD-415.19)...  Significant.Marland Kitchen ??2008 and??.. Coumadin therapy Right ventricular dysfunction   at time of pulmonary embolism in the past  /  echo.. 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 Dr. Barnet Pall Right bundle branch block  intermittent Back and leg pain..... chronic... 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    (06/23/2010 11:41) Added new observation of PRIMARY MD: Corwin Levins MD (06/23/2010 11:41)       Past History:  Past Medical History:  PULMONARY EMBOLISM (ICD-415.19)...  Significant.Marland Kitchen ??2008 and??.. Coumadin therapy Right ventricular dysfunction   at time of pulmonary embolism in the past  /  echo.. 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 Dr. Barnet Pall Right bundle branch block  intermittent Back and leg pain..... chronic... methadone Methadone    for chronic leg and back pain Homocystine   significant elevation in the  past ... 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

## 2010-09-09 NOTE — Medication Information (Signed)
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 no    Recent/future dental: no  Any missed doses?: no       Is patient compliant with meds? yes       Allergies: 1)  ! * Narcan 2)  ! Effexor 3)  ! Morphine  Anticoagulation Management History:      The patient is taking warfarin and comes in today for a routine follow up visit.  Negative risk factors for bleeding include an age less than 60 years old.  The bleeding index is 'low risk'.  Negative CHADS2 values include Age > 61 years old.  The start date was 07/09/2007.  Her last INR was 6.7 ratio.  Anticoagulation responsible provider: Teejay Meader MD, Reuel Boom.  INR POC: 3.8.  Cuvette Lot#: [id number].  Exp: 09/2011.    Anticoagulation Management Assessment/Plan:      The patient's current anticoagulation dose is Warfarin sodium 5 mg tabs: Use as directed by Anticoagulation Clinic.  The target INR is 2.0-3.0.  The next INR is due 08/31/2010.  Anticoagulation instructions were given to patient.  Results were reviewed/authorized by Bethena Midget, RN, BSN.  She was notified by Bethena Midget, RN, BSN.         Prior Anticoagulation Instructions: INR 3.3 Today take 1 pill, then resume 2 pills everyday except 1 pill on Mondays and Fridays. Recheck in 3 weeks.   Current Anticoagulation Instructions: INR 3.8 Skip Today. Then change dose to 2 pills everyday except 1 pill on Mondays, Wednesdays and Fridays. Recheck in 2 weeks.

## 2010-09-09 NOTE — Medication Information (Signed)
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 no    Recent/future dental: no  Any missed doses?: yes     Details: Possibly missed a dose 1 week ago on a day that was supposed to be 2 tablets, so she took 1 instead in case she has already taken it.   Is patient compliant with meds? yes       Allergies: 1)  ! * Narcan 2)  ! Effexor 3)  ! Morphine  Anticoagulation Management History:      The patient is taking warfarin and comes in today for a routine follow up visit.  Negative risk factors for bleeding include an age less than 11 years old.  The bleeding index is 'low risk'.  Negative CHADS2 values include Age > 56 years old.  The start date was 07/09/2007.  Her last INR was 6.7 ratio.  Anticoagulation responsible Keyarah Mcroy: Ladona Ridgel MD, Sharlot Gowda.  INR POC: 2.6.  Cuvette Lot#: [id number].  Exp: 06/2011.    Anticoagulation Management Assessment/Plan:      The patient's current anticoagulation dose is Warfarin sodium 5 mg tabs: Use as directed by Anticoagulation Clinic.  The target INR is 2.0-3.0.  The next INR is due 05/13/2010.  Anticoagulation instructions were given to patient.  Results were reviewed/authorized by Eda Keys, PharmD.  She was notified by Harrel Carina, PharmD candidate.         Prior Anticoagulation Instructions: INR  Skip today's dose completely.  Then resume normal Couamdin Schedule of taking 2 tablets (10mg ) every day except take 1 tablet (5mg ) on Mondays and Fridays.  Recheck in 3 weeks.    Current Anticoagulation Instructions: INR 2.6  Continue taking 2 tablets  everyday except take 1 tablet on Mondays and Fridays. Re-check INR in 4 weeks.

## 2010-09-09 NOTE — Medication Information (Signed)
Summary: [facility]  Anticoagulant Therapy  Managed by: Cloyde Reams, RN, BSN Referring MD: Willa Rough MD Supervising MD: Clifton James MD, Cristal Deer Indication 1: Pulmonary Embolism and Infarction (ICD-415.1) Lab Used: [facility] Site: [facility] INR POC 2.7 INR RANGE 2 - 3  Dietary changes: no    Health status changes: yes       Details: Incr stress, causing incr reflux.    Bleeding/hemorrhagic complications: no    Recent/future hospitalizations: no    Any changes in medication regimen? no    Recent/future dental: no  Any missed doses?: no       Is patient compliant with meds? yes       Allergies: 1)  ! * Narcan 2)  ! Effexor 3)  ! Morphine  Anticoagulation Management History:      The patient is taking warfarin and comes in today for a routine follow up visit.  Negative risk factors for bleeding include an age less than 83 years old.  The bleeding index is 'low risk'.  Negative CHADS2 values include Age > 39 years old.  The start date was 07/09/2007.  Her last INR was 6.7 ratio.  Anticoagulation responsible provider: Clifton James MD, Cristal Deer.  INR POC: 2.7.  Cuvette Lot#: [id number].  Exp: 09/2010.    Anticoagulation Management Assessment/Plan:      The patient's current anticoagulation dose is Warfarin sodium 5 mg tabs: Use as directed by Anticoagulation Clinic.  The target INR is 2.0-3.0.  The next INR is due 09/09/2009.  Anticoagulation instructions were given to patient.  Results were reviewed/authorized by Cloyde Reams, RN, BSN.  She was notified by Cloyde Reams RN.         Prior Anticoagulation Instructions: INR 2.2  Continue on same dosage 2 tablets daily except 1 tablet on Mondays and Fridays.  Recheck in 3 weeks.     Current Anticoagulation Instructions: INR 2.7  Continue on same dosage 2 tablets daily except 1 tablet on Mondays and Fridays.  Recheck in 4 weeks.

## 2010-09-09 NOTE — Miscellaneous (Signed)
  Clinical Lists Changes  Observations: Added new observation of PAST MED HX:  PULMONARY EMBOLISM (ICD-415.19)...  Significant.Marland Kitchen ??2008 and??.. Coumadin therapy RV dysfunction... echo... 2008..EF 50%.... right ventricle markedly dilated with marked right ventricular dysfunction and moderate tricuspid regurgitation  /  echo.. 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 Dr. Barnet Pall Right bundle branch block  intermittent Back and leg pain..... chronic... 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    (06/23/2010 13:59) Added new observation of PRIMARY MD: Corwin Levins MD (06/23/2010 13:59)       Past History:  Past Medical History:  PULMONARY EMBOLISM (ICD-415.19)...  Significant.Marland Kitchen ??2008 and??.. Coumadin therapy RV dysfunction... echo... 2008..EF 50%.... right ventricle markedly dilated with marked right ventricular dysfunction and moderate tricuspid regurgitation  /  echo.. 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 Dr. Barnet Pall Right bundle branch block  intermittent Back and leg pain.....  chronic... 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

## 2010-09-09 NOTE — Assessment & Plan Note (Signed)
Summary: 2wk f/u sl      Allergies Added:   Visit Type:  Follow-up Primary Provider:  Corwin Levins MD  CC:  chest pain.  History of Present Illness: The patient is seen for followup of her chest pain.  I saw her last June 10, 2010.  She come in to the Coumadin clinic and was having chest discomfort.  She has enormous stress at home.  There is a chronic pain syndrome also.  I felt her pain was noncardiac time.  We did check the CPK to be sure that this was normal there was no abnormality.   The patient needs some type of GYN biopsy.  Need to be sure that her INR is low enough for this.  I will ask for the Coumadin team to help [place] coordinate this.  Overall she is not having significant chest pain now.  I reviewed her old records and completely up the past medical history.  She had minimal coronary disease by catheterization in October, 2008.  Current Medications (verified): 1)  Bl Vitamin B-6 100 Mg  Tabs (Pyridoxine Hcl) .Marland Kitchen.. 1 By Mouth Daily 2)  B-12 1500 Mcg  Tbcr (Cyanocobalamin) .Marland Kitchen.. 1 Daily 3)  Aspirin 81 Mg Tbec (Aspirin) .... Take One Tablet By Mouth Daily 4)  Methadone Hcl 10 Mg  Tabs (Methadone Hcl) .... 2 Tablets Two Times A Day 5)  Folic Acid 1 Mg Tabs (Folic Acid) .Marland Kitchen.. 1 Tab Daily 6)  Vitamin C 500 Mg  Tabs (Ascorbic Acid) .... 2 Daily 7)  Warfarin Sodium 5 Mg Tabs (Warfarin Sodium) .... Use As Directed By Anticoagulation Clinic 8)  Hydromorphone Hcl 2 Mg Tabs (Hydromorphone Hcl) .... Take One Tablet As Needed For Breakthrough Pain 9)  Milk of Magnesia 7.75 % Susp (Magnesium Hydroxide) .... As Needed 10)  Rolaids Extra Strength 1177 Mg Chew (Calcium Carbonate Antacid) .... As Needed 11)  Oysco D 250-125 Mg-Unit Tabs (Calcium Carbonate-Vitamin D) .... Once Daily 12)  Prilosec Otc 20 Mg Tbec (Omeprazole Magnesium) .... Take 1 Tablet By Mouth Once A Day  Allergies (verified): 1)  ! * Narcan 2)  ! Effexor 3)  ! Morphine  Past History:  Past Medical History:  PULMONARY  EMBOLISM (ICD-415.19)...  Significant.Marland Kitchen ??2008 and??.. Coumadin therapy RV dysfunction... echo... 2008..EF 50%.... right ventricle markedly dilated with marked right ventricular dysfunction and moderate tricuspid regurgitation  /  echo.. 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 Dr. Barnet Pall Right bundle branch block  intermittent Back and leg pain..... chronic... 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  Review of Systems       Patient denies fever, chills, headache, sweats, rash, change in vision, change in hearing, chest pain, cough, nausea vomiting, urinary symptoms.  All other systems are reviewed and are negative  Vital Signs:  Patient profile:   55 year old female Height:      70 inches Weight:      259 pounds BMI:     37.30 Pulse rate:   80 / minute BP sitting:   118 / 64  (left  arm) Cuff size:   large  Vitals Entered By: Hardin Negus, RMA (June 24, 2010 10:55 AM)  Physical Exam  General:  she is stable today. Eyes:  no xanthelasma. Neck:  no jugular venous distention. Lungs:  lungs are clear.  Respiratory effort is not labored. Heart:  cardiac exam reveals S1-S2.  There are no clicks or significant murmurs. Abdomen:  abdomen is soft. Extremities:  no peripheral edema. Psych:  patient is oriented to person time and place.  Affect is normal.   Impression & Recommendations:  Problem # 1:  * CHEST PAIN WITH STRESS. The patient has only minimal  coronary disease.  I feel that her chest pain is not ischemic in origin.  No further workup at this time.  Problem # 2:  LOW BACK PAIN, CHRONIC (ICD-724.2) Her low back pain is chronic.  No change in therapy.  Problem # 3:  * STRESS AT HOME Her stress is a major problem.  Hopeful that she will be able to improve the situation.  Problem # 4:  * GYN ABNORMALITY... NEED FOR BIOPSY The patient is stable from the cardiac viewpoint to have her GYN evaluation.  We will try to help ward her Coumadin therapy for this.  Patient Instructions: 1)  Your physician wants you to follow-up in:  6 months.  You will receive a reminder letter in the mail two months in advance. If you don't receive a letter, please call our office to schedule the follow-up appointment.

## 2010-09-09 NOTE — Assessment & Plan Note (Signed)
Summary: rov  Medications Added PRILOSEC OTC 20 MG TBEC (OMEPRAZOLE MAGNESIUM) Take 1 tablet by mouth once a day      Allergies Added:   Visit Type:  add-on visit Primary Provider:  Corwin Levins MD  CC:  chest discomfort.  History of Present Illness: The patient came in to the Coumadin clinic today.  When asked how she was feeling, she said that she was having discomfort in her chest.  She has had enormous stress at home.  She has a chronic pain syndrome.  He says that the pain is worse with stress.  However he is in her chest.  At times it appears to go to her back.  Patient has a history of pulmonary embolus in the past.  She is on Coumadin.  There is a history of coronary disease but I do not have the specifics at this time.  Current Medications (verified): 1)  Bl Vitamin B-6 100 Mg  Tabs (Pyridoxine Hcl) .Marland Kitchen.. 1 By Mouth Daily 2)  B-12 1500 Mcg  Tbcr (Cyanocobalamin) .Marland Kitchen.. 1 Daily 3)  Aspirin 81 Mg Tbec (Aspirin) .... Take One Tablet By Mouth Daily 4)  Methadone Hcl 10 Mg  Tabs (Methadone Hcl) .... Two Times A Day or As Needed 5)  Folic Acid 1 Mg Tabs (Folic Acid) .Marland Kitchen.. 1 Tab Daily 6)  Vitamin C 500 Mg  Tabs (Ascorbic Acid) .... 2 Daily 7)  Warfarin Sodium 5 Mg Tabs (Warfarin Sodium) .... Use As Directed By Anticoagulation Clinic 8)  Hydromorphone Hcl 2 Mg Tabs (Hydromorphone Hcl) .... Take One Tablet As Needed For Breakthrough Pain 9)  Milk of Magnesia 7.75 % Susp (Magnesium Hydroxide) .... As Needed 10)  Rolaids Extra Strength 1177 Mg Chew (Calcium Carbonate Antacid) .... As Needed  Allergies (verified): 1)  ! * Narcan 2)  ! Effexor 3)  ! 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  All other systems are reviewed and are negative.  Vital Signs:  Patient profile:   55 year old female Height:      70 inches Weight:      258 pounds BMI:     37.15 Pulse rate:   66 / minute BP sitting:   118 / 80  Vitals Entered By: Hardin Negus, RMA (June 10, 2010 11:52 AM)  Physical Exam  General:  patient is overweight but stable. Head:  head is atraumatic. Eyes:  no xanthelasma. Neck:  no jugular venous distention. Chest Wall:  no chest wall tenderness. Lungs:  lungs are clear.  Respiratory effort is nonlabored. Heart:  cardiac exam reveals S1 and S2.  No clicks or significant murmurs. Abdomen:  abdomen is soft. Msk:  no musculoskeletal deformities. Extremities:  no peripheral edema. Skin:  no skin rashes. Psych:  patient is oriented to person time and place.  Affect is normal.   Impression & Recommendations:  Problem # 1:  * CHEST PAIN WITH STRESS. The patient is in today because of chest pain with stress.  The story is concerning but there is no proof of an acute coronary syndrome.  EKG is done today and reviewed by me.  There is  no diagnostic change.  Patient has a chronic pain syndrome.  She will be seeing the doctor today who gives her her pain medications.  I will allow her to have that visit.  Before she leaves however we will check CPK MB and troponin.  If they show any significant abnormality we will recontact her and arrange for hospitalization.  Problem # 2:  EDEMA (ICD-782.3) There is no significant edema this time.  No change in therapy.  Problem # 3:  PULMONARY EMBOLISM (ICD-415.19)  Her updated medication list for this problem includes:    Aspirin 81 Mg Tbec (Aspirin) .Marland Kitchen... Take one tablet by mouth daily    Warfarin Sodium 5 Mg Tabs (Warfarin sodium) ..... Use as directed by anticoagulation clinic The patient is coumadinized.   I doubt that she has had a pulmonary embolus.  No change in therapy.  Problem # 4:  TOBACCO ABUSE (ICD-305.1) Patient smokes rare cigarette.  I counseled her to stop.  Other Orders: T-Troponin I ([id number]) TLB-CK-MB (Creatine Kinase MB) ([id number])  Patient Instructions: 1)  Start Prilosec daily 2)  Labs today 3)  Follow up in 2-3 weeks Prescriptions: PRILOSEC OTC 20 MG TBEC (OMEPRAZOLE MAGNESIUM) Take 1 tablet by mouth once a day  #30 x 6   Entered by:   Meredith Staggers, RN   Authorized by:   Talitha Givens, MD, [facility]   Signed by:   Meredith Staggers, RN on 06/10/2010   Method used:   Electronically to        [facility] [address] [phone]* (retail)       [address]       [place], [place]  [address]       Ph: [phone] or [phone]       Fax: [phone]   RxID:   [id number]

## 2010-09-09 NOTE — Medication Information (Signed)
Summary: rov/sp  Anticoagulant Therapy  Managed by: Bethena Midget, RN, BSN Referring MD: Willa Rough MD Supervising MD: Johney Frame MD, Fayrene Fearing Indication 1: Pulmonary Embolism and Infarction (ICD-415.1) Lab Used: [facility] Site: [facility] INR POC 1.5 INR RANGE 2 - 3  Dietary changes: no    Health status changes: no    Bleeding/hemorrhagic complications: no    Recent/future hospitalizations: no    Any changes in medication regimen? no    Recent/future dental: no  Any missed doses?: yes     Details: missed yesterday dose  Is patient compliant with meds? yes       Allergies: 1)  ! * Narcan 2)  ! Effexor 3)  ! Morphine  Anticoagulation Management History:      The patient is taking warfarin and comes in today for a routine follow up visit.  Negative risk factors for bleeding include an age less than 11 years old.  The bleeding index is 'low risk'.  Negative CHADS2 values include Age > 84 years old.  The start date was 07/09/2007.  Her last INR was 6.7 ratio.  Anticoagulation responsible provider: Jaxiel Kines MD, Fayrene Fearing.  INR POC: 1.5.  Cuvette Lot#: [id number].  Exp: 09/20212.    Anticoagulation Management Assessment/Plan:      The patient's current anticoagulation dose is Warfarin sodium 5 mg tabs: Use as directed by Anticoagulation Clinic.  The target INR is 2.0-3.0.  The next INR is due 02/11/2010.  Anticoagulation instructions were given to patient.  Results were reviewed/authorized by Bethena Midget, RN, BSN.  She was notified by Bethena Midget, RN, BSN.         Prior Anticoagulation Instructions: INR 3.7  Skip today's dose of Coumadin then decrease dose to 2 tablets every day except 1 tablet on Monday, Wednesday and Friday.   Current Anticoagulation Instructions: INR 1.5 Today take 3 pills then change dose to 2 pills everyday except 1 pill on Mondays and Fridays. Recheck in 2 weeks.

## 2010-09-09 NOTE — Miscellaneous (Signed)
Summary: primary care ref  Clinical Lists Changes  Problems: Added new problem of UNSPECIFIED HYPOTHYROIDISM (ICD-244.9) Orders: Added new Referral order of Primary Care Referral (Primary) - Signed

## 2010-09-09 NOTE — Assessment & Plan Note (Signed)
Summary: Nurse Visit       Medications Prior to Update: 1)  Bl Vitamin B-6 100 Mg  Tabs (Pyridoxine Hcl) .Marland Kitchen.. 1 By Mouth Daily 2)  B-12 1500 Mcg  Tbcr (Cyanocobalamin) .Marland Kitchen.. 1 Daily 3)  Aspirin 81 Mg Tbec (Aspirin) .... Take One Tablet By Mouth Daily 4)  Methadone Hcl 10 Mg  Tabs (Methadone Hcl) .... Two Times A Day or As Needed 5)  Folic Acid 1 Mg Tabs (Folic Acid) .Marland Kitchen.. 1 Tab Daily 6)  Vitamin C 500 Mg  Tabs (Ascorbic Acid) .... 2 Daily 7)  Warfarin Sodium 5 Mg Tabs (Warfarin Sodium) .... Use As Directed By Anticoagulation Clinic 8)  Hydromorphone Hcl 2 Mg Tabs (Hydromorphone Hcl) .... Take One Tablet As Needed For Breakthrough Pain 9)  Milk of Magnesia 7.75 % Susp (Magnesium Hydroxide) .... As Needed 10)  Rolaids Extra Strength 1177 Mg Chew (Calcium Carbonate Antacid) .... As Needed  Allergies: 1)  ! * Narcan 2)  ! Effexor 3)  ! Morphine   Vital Signs:  Patient profile:   55 year old female Pulse rate:   66 / minute Pulse rhythm:   regular BP sitting:   118 / 80  (left arm)      Impression & Recommendations: Pt came into office today for a coumadin check and mentioned chest pain to the nurse there.  Pt c/o of epigastric pain x 3 days that she calls a mashing pain.  Pain is unrelieved by MOM and rolaids.  Radiates to between shoulders when laying down at night.  Pt c/o extreme tiredness with activity and becomes short of breath only if she pushes herself with activity.  She states that she has been under a lot of stress lately.  She has been on Aciphex in the past but had to stop it because insurance no longer covered it. EKG showed NSR w/PVCs. Pt also states that chronic pain med does not eliminate epigastric pain. Dr. Myrtis Ser to assess.      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

## 2010-09-09 NOTE — Medication Information (Signed)
Summary: rov/ewj  Anticoagulant Therapy  Managed by: Cloyde Reams, RN, BSN Referring MD: Willa Rough MD Supervising MD: Gala Romney MD, Reuel Boom Indication 1: Pulmonary Embolism and Infarction (ICD-415.1) Lab Used: [facility] Site: [facility] INR POC 2.8 INR RANGE 2 - 3  Dietary changes: no    Health status changes: no    Bleeding/hemorrhagic complications: yes       Details: Incr bleeding from leg sores, and from nose.    Recent/future hospitalizations: no    Any changes in medication regimen? no    Recent/future dental: no  Any missed doses?: yes     Details: Missed 1 dosage at night, and took 2 tablets on 1 tablet day.    Is patient compliant with meds? yes       Allergies: 1)  ! * Narcan 2)  ! Effexor 3)  ! Morphine  Anticoagulation Management History:      The patient is taking warfarin and comes in today for a routine follow up visit.  Negative risk factors for bleeding include an age less than 1 years old.  The bleeding index is 'low risk'.  Negative CHADS2 values include Age > 30 years old.  The start date was 07/09/2007.  Her last INR was 6.7 ratio.  Anticoagulation responsible provider: Bensimhon MD, Reuel Boom.  INR POC: 2.8.  Cuvette Lot#: [id number].  Exp: 11/2010.    Anticoagulation Management Assessment/Plan:      The patient's current anticoagulation dose is Warfarin sodium 5 mg tabs: Use as directed by Anticoagulation Clinic.  The target INR is 2.0-3.0.  The next INR is due 10/07/2009.  Anticoagulation instructions were given to patient.  Results were reviewed/authorized by Cloyde Reams, RN, BSN.  She was notified by Cloyde Reams RN.         Prior Anticoagulation Instructions: INR 2.7  Continue on same dosage 2 tablets daily except 1 tablet on Mondays and Fridays.  Recheck in 4 weeks.    Current Anticoagulation Instructions: INR 2.8  Continue on same dosage 2 tablets daily except 1 tablet on Mondays and Fridays.  Recheck in 4 weeks.

## 2010-09-09 NOTE — Miscellaneous (Signed)
Summary: Orders Update  Clinical Lists Changes  Orders: Added new Test order of TLB-BMP (Basic Metabolic Panel-BMET) (80048-METABOL) - Signed Added new Test order of TLB-Hepatic/Liver Function Pnl (80076-HEPATIC) - Signed Added new Test order of TLB-CBC Platelet - w/Differential (85025-CBCD) - Signed Added new Test order of TLB-TSH (Thyroid Stimulating Hormone) (84443-TSH) - Signed

## 2010-09-09 NOTE — Assessment & Plan Note (Signed)
Summary: f24m/dfg  Medications Added VITAMIN C 500 MG  TABS (ASCORBIC ACID) 2 daily MILK OF MAGNESIA 7.75 % SUSP (MAGNESIUM HYDROXIDE) as needed ROLAIDS EXTRA STRENGTH 1177 MG CHEW (CALCIUM CARBONATE ANTACID) as needed      Allergies Added:   Visit Type:  Follow-up Primary Provider:  Corwin Levins MD  CC:  venous insufficiency.  History of Present Illness: The patient is seen for followup her cardiac issues.  She does have mild coronary disease.  There is a history of a pulmonary embolus in the past.  She has venous insufficiency.  She did have a recent cough.  Study showed no DVT.  She does have mild deep venous incompetence in the popliteal veins bilaterally she has mild superficial venous incompetence in the right GSV and moderate in the left.  She is arranging to get support hose.  She's not having the chest pain.  Current Medications (verified): 1)  Bl Vitamin B-6 100 Mg  Tabs (Pyridoxine Hcl) .Marland Kitchen.. 1 By Mouth Daily 2)  B-12 1500 Mcg  Tbcr (Cyanocobalamin) .Marland Kitchen.. 1 Daily 3)  Aspirin 81 Mg Tbec (Aspirin) .... Take One Tablet By Mouth Daily 4)  Methadone Hcl 10 Mg  Tabs (Methadone Hcl) .... Two Times A Day or As Needed 5)  Folic Acid 1 Mg Tabs (Folic Acid) .Marland Kitchen.. 1 Tab Daily 6)  Vitamin C 500 Mg  Tabs (Ascorbic Acid) .... 2 Daily 7)  Warfarin Sodium 5 Mg Tabs (Warfarin Sodium) .... Use As Directed By Anticoagulation Clinic 8)  Hydromorphone Hcl 2 Mg Tabs (Hydromorphone Hcl) .... Take One Tablet As Needed For Breakthrough Pain 9)  Milk of Magnesia 7.75 % Susp (Magnesium Hydroxide) .... As Needed 10)  Rolaids Extra Strength 1177 Mg Chew (Calcium Carbonate Antacid) .... As Needed  Allergies (verified): 1)  ! * Narcan 2)  ! Effexor 3)  ! 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  All other systems are reviewed and are negative.  Vital Signs:  Patient profile:   55 year old female Height:      70 inches Weight:      263 pounds BMI:     37.87 Pulse rate:   80 / minute BP sitting:   110 / 74  (left arm) Cuff size:   regular  Vitals Entered By: Hardin Negus, RMA (February 17, 2010 2:14 PM)  Physical Exam  General:  patient significantly overweight but stable. Eyes:  no xanthelasma. Neck:  no jugular venous distention. Lungs:  lungs are clear.  Respiratory effort is nonlabored. Heart:  cardiac exam reveals S1-S2.  No clicks or significant murmurs. Abdomen:  abdomen is obese but soft. Extremities:  patient has significant brawny edema in both legs.  There is no heat or redness. Psych:  patient is oriented to person time and place.  Affect is normal.   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  She will be following up with her primary doctor concerning thyroid in general.  Problem # 2:  EDEMA (ICD-782.3) Edema is chronic.  She does need to wear support hose and she is  looking into this.  Problem # 3:  TOBACCO ABUSE (ICD-305.1) The patient continues to smoke.  We have advised her to stop.  Problem # 4:  CORONARY HEART DISEASE (ICD-414.00)  Her updated medication list for this problem includes:    Aspirin 81 Mg Tbec (Aspirin) .Marland Kitchen... Take one tablet by mouth daily    Warfarin Sodium 5 Mg Tabs (Warfarin sodium) ..... Use as directed by anticoagulation clinic The patient does have coronary artery disease.  If she decides to have any type of knee surgery and will be appropriate to assess her further.  I do feel that overall she can probably proceed.  The veins in her legs of  course are a huge issue.  Patient Instructions: 1)  Your physician wants you to follow-up in:  6 months.  You will receive a reminder letter in the mail two months in advance. If you don't receive a letter, please call our office to schedule the follow-up appointment.

## 2010-09-09 NOTE — Medication Information (Signed)
Summary: [name]      Allergies Added:  Anticoagulant Therapy  Managed by: Lynann Bologna, PharmD Referring MD: Willa Rough MD Supervising MD: Clifton James MD, Cristal Deer Indication 1: Pulmonary Embolism and Infarction (ICD-415.1) Lab Used: [facility] Site: [facility] INR POC 3.3 INR RANGE 2 - 3  Dietary changes: no    Health status changes: no    Bleeding/hemorrhagic complications: no    Recent/future hospitalizations: no    Any changes in medication regimen? no    Recent/future dental: no  Any missed doses?: no       Is patient compliant with meds? yes       Current Medications (verified): 1)  Bl Vitamin B-6 100 Mg  Tabs (Pyridoxine Hcl) .Marland Kitchen.. 1 By Mouth Daily 2)  B-12 1500 Mcg  Tbcr (Cyanocobalamin) .Marland Kitchen.. 1 Daily 3)  Aspirin 81 Mg Tbec (Aspirin) .... Take One Tablet By Mouth Daily 4)  Methadone Hcl 10 Mg  Tabs (Methadone Hcl) .... Two Times A Day or As Needed 5)  Amitiza 24 Mcg  Caps (Lubiprostone) .Marland Kitchen.. 1 Two Times A Day/take With Food and Water As Needed 6)  Folic Acid 1 Mg Tabs (Folic Acid) .Marland Kitchen.. 1 Tab Daily 7)  Ascorbic Acid 1000 Mg Tabs (Ascorbic Acid) .Marland Kitchen.. 1 Tablet Two Times A Day 8)  Oxycodone Hcl 5 Mg Caps (Oxycodone Hcl) .Marland Kitchen.. 1 Tablet Every 6 Hours As Needed For Breakthrough Pain 9)  Warfarin Sodium 5 Mg Tabs (Warfarin Sodium) .... Use As Directed By Anticoagulation Clinic  Allergies (verified): 1)  ! * Narcan 2)  ! Effexor 3)  ! Morphine  Anticoagulation Management History:      The patient is taking warfarin and comes in today for a routine follow up visit.  Negative risk factors for bleeding include an age less than 3 years old.  The bleeding index is 'low risk'.  Negative CHADS2 values include Age > 84 years old.  The start date was 07/09/2007.  Her last INR was 6.7 ratio.  Anticoagulation responsible provider: Clifton James MD, Cristal Deer.  INR POC: 3.3.  Cuvette Lot#: [id number].  Exp: 12/2010.    Anticoagulation Management Assessment/Plan:      The  patient's current anticoagulation dose is Warfarin sodium 5 mg tabs: Use as directed by Anticoagulation Clinic.  The target INR is 2.0-3.0.  The next INR is due 12/07/2009.  Anticoagulation instructions were given to patient.  Results were reviewed/authorized by Lynann Bologna, PharmD.  She was notified by Lynann Bologna.         Prior Anticoagulation Instructions: INR 2.0 Continue 2 pills everyday exceot 1 pill on Mondays and Fridays. Recheck in 4 weeks.   Current Anticoagulation Instructions: INR 3.3  Hold today's dose. Then resume same dose of 2 tablets daily (10 mg) except 1 tablet on Mondays and Fridays.   Recheck in 3 weeks: Monday, May 2nd at 12:00 pm.

## 2010-09-09 NOTE — Medication Information (Signed)
Summary: rov/ewj  Anticoagulant Therapy  Managed by: Bethena Midget, RN, BSN Referring MD: Willa Rough MD Supervising MD: Ladona Ridgel MD, Sharlot Gowda Indication 1: Pulmonary Embolism and Infarction (ICD-415.1) Lab Used: [facility] Site: [facility] INR POC 2.0 INR RANGE 2 - 3  Dietary changes: no    Health status changes: no    Bleeding/hemorrhagic complications: no    Recent/future hospitalizations: no    Any changes in medication regimen? no    Recent/future dental: no  Any missed doses?: yes     Details: missed 2/24 - 2/27 doses when got meds she took double dose on Mon and Tues.   Is patient compliant with meds? yes       Allergies: 1)  ! * Narcan 2)  ! Effexor 3)  ! Morphine  Anticoagulation Management History:      The patient is taking warfarin and comes in today for a routine follow up visit.  Negative risk factors for bleeding include an age less than 80 years old.  The bleeding index is 'low risk'.  Negative CHADS2 values include Age > 28 years old.  The start date was 07/09/2007.  Her last INR was 6.7 ratio.  Anticoagulation responsible provider: Ladona Ridgel MD, Sharlot Gowda.  INR POC: 2.0.  Cuvette Lot#: [id number].  Exp: 12/2010.    Anticoagulation Management Assessment/Plan:      The patient's current anticoagulation dose is Warfarin sodium 5 mg tabs: Use as directed by Anticoagulation Clinic.  The target INR is 2.0-3.0.  The next INR is due 11/09/2009.  Anticoagulation instructions were given to patient.  Results were reviewed/authorized by Bethena Midget, RN, BSN.  She was notified by Bethena Midget, RN, BSN.         Prior Anticoagulation Instructions: INR 2.8  Continue on same dosage 2 tablets daily except 1 tablet on Mondays and Fridays.  Recheck in 4 weeks.    Current Anticoagulation Instructions: INR 2.0 Continue 2 pills everyday exceot 1 pill on Mondays and Fridays. Recheck in 4 weeks.

## 2010-09-09 NOTE — Assessment & Plan Note (Signed)
Summary: sob/swelling in legs/saf  Medications Added METHADONE HCL 10 MG  TABS (METHADONE HCL) two times a day or as needed AMITIZA 24 MCG  CAPS (LUBIPROSTONE) 1 two times a day/take with food and water as needed//taking as needed HYDROMORPHONE HCL 2 MG TABS (HYDROMORPHONE HCL) take one tablet as needed for breakthrough pain      Allergies Added:   CC:  SOB/edema and cramps she describes being all over her body. Sometimes so bad that she considers them like charlie horses.  .  History of Present Illness: This is a 55 year old white female patient who was last seen here in 2009. She has a history of significant pulmonary embolus in the past. She has had a cardiac catheterization in the remote past that showed non-obstructive coronary disease. She's had a history of severe right ventricular dysfunction associated with pulmonary embolus in the past. Last 2-D echo was in March of 2010 which showed normal LV function ejection fraction 50% no left ventricular wall motion abnormalities, the right ventricle was mildly dilated with right ventricular systolic function mildly reduced. The right atrium was mildly dilated.  The patient comes in today with worsening of her chronic lower extremity edema.  Her legs are usually not swollen in the morning when she gets up but they progressively worsening as the day goes on, especially if she is on her feet a lot. She is also having significant bilateral leg cramps worse at night.   The patient also had an episode of shortness of breath with diaphoresis when she got up to urinate at 4 in the morning approximately one week ago. She said it was very short lived and passed in less than 5 minutes. At first it reminded her of her pulmonary embolus but she had no chest pain and it passed quickly. Her INR was 3.6 last week. She denies recurrent episodes like this.  Current Medications (verified): 1)  Bl Vitamin B-6 100 Mg  Tabs (Pyridoxine Hcl) .Marland Kitchen.. 1 By Mouth Daily 2)   B-12 1500 Mcg  Tbcr (Cyanocobalamin) .Marland Kitchen.. 1 Daily 3)  Aspirin 81 Mg Tbec (Aspirin) .... Take One Tablet By Mouth Daily 4)  Methadone Hcl 10 Mg  Tabs (Methadone Hcl) .... Two Times A Day or As Needed 5)  Amitiza 24 Mcg  Caps (Lubiprostone) .Marland Kitchen.. 1 Two Times A Day/take With Food and Water As Needed//taking As Needed 6)  Folic Acid 1 Mg Tabs (Folic Acid) .Marland Kitchen.. 1 Tab Daily 7)  Ascorbic Acid 1000 Mg Tabs (Ascorbic Acid) .Marland Kitchen.. 1 Tablet Two Times A Day 8)  Warfarin Sodium 5 Mg Tabs (Warfarin Sodium) .... Use As Directed By Anticoagulation Clinic 9)  Hydromorphone Hcl 2 Mg Tabs (Hydromorphone Hcl) .... Take One Tablet As Needed For Breakthrough Pain  Allergies (verified): 1)  ! * Narcan 2)  ! Effexor 3)  ! 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) HYPOTENSION (ICD-458.9) LOW BACK PAIN, CHRONIC (ICD-724.2) CORONARY HEART DISEASE (ICD-414.00)  Past Surgical History: Last updated: 01/15/2010  cholecystectomy, gastric bypass, hip replacement, rotator  cuff       repair, tonsillectomy   Review of Systems       see history of present illness  Vital Signs:  Patient profile:   55 year old female Height:      70 inches Weight:      260 pounds BMI:     37.44 Pulse rate:  68 / minute Pulse rhythm:   regular BP sitting:   94 / 64  (left arm) Cuff size:   regular  Vitals Entered By: Judithe Modest CMA (January 18, 2010 1:11 PM)  Physical Exam  General:   Well-nournished, in no acute distress. Neck: No JVD, HJR, Bruit, or thyroid enlargement Lungs: No tachypnea, clear without wheezing, rales, or rhonchi Cardiovascular: RRR, PMI not displaced, heart sounds normal, no murmurs, gallops, bruit, thrill, or heave. Abdomen: BS normal. Soft without organomegaly, masses, lesions or tenderness. Extremities: Brawny edematous changes  bilaterally with significant varicosities.Tender to touch. Good distal pulses bilateral SKin: Warm, no lesions or rashes  Musculoskeletal: No deformities Neuro: no focal signs    Impression & Recommendations:  Problem # 1:  EDEMA (ICD-782.3) Patient has significant bilateral brawny edema and severe leg cramps. I recommended 2 g sodium diet, TED hose,venous Dopplers and we will check labs to make sure her potassium is not low. Orders: TLB-BMP (Basic Metabolic Panel-BMET) (80048-METABOL) TLB-Hepatic/Liver Function Pnl (80076-HEPATIC) TLB-CBC Platelet - w/Differential (85025-CBCD) TLB-TSH (Thyroid Stimulating Hormone) (84443-TSH) Venous Duplex Lower Extremity (Venous Duplex Lower)  Problem # 2:  TOBACCO ABUSE (ICD-305.1) I asked the patient to quit smoking cigarettes altogether she is down to 3-4 cigarettes daily. I also asked her to follow up with pulmonary if she continues to have breathing difficulty  Problem # 3:  CORONARY HEART DISEASE (ICD-414.00) Patient has history of remote catheter showing nonobstructive coronary disease. She has any recurrent shortness of breath with diaphoresis we may consider further ischemic workup Her updated medication list for this problem includes:    Aspirin 81 Mg Tbec (Aspirin) .Marland Kitchen... Take one tablet by mouth daily    Warfarin Sodium 5 Mg Tabs (Warfarin sodium) ..... Use as directed by anticoagulation clinic  Orders: EKG w/ Interpretation (93000) TLB-BMP (Basic Metabolic Panel-BMET) (80048-METABOL) TLB-Hepatic/Liver Function Pnl (80076-HEPATIC) TLB-CBC Platelet - w/Differential (85025-CBCD) TLB-TSH (Thyroid Stimulating Hormone) (84443-TSH) Venous Duplex Lower Extremity (Venous Duplex Lower)  Problem # 4:  HYPOTENSION (ICD-458.9) patient has always been hypotensive. I will hold off on diuretic at this time.  Problem # 5:  PULMONARY EMBOLISM (ICD-415.19) Patient has history of significant pulmonary embolus and is on Coumadin. If she has any  recurrent dyspnea and diaphoresis or chest pain consider CT scan. Her updated medication list for this problem includes:    Aspirin 81 Mg Tbec (Aspirin) .Marland Kitchen... Take one tablet by mouth daily    Warfarin Sodium 5 Mg Tabs (Warfarin sodium) ..... Use as directed by anticoagulation clinic  Patient Instructions: 1)  Your physician recommends that you schedule a follow-up appointment in: with Dr. Myrtis Ser in 1 month 2)  Your physician recommends that you have lab work today: bmp, cbc, tsh, liver function 3)  Your physician has requested that you have a bilateral  lower extremity venous duplex.  This test is an ultrasound of the veins in the legs or arms.  It looks at venous blood flow that carries blood from the heart to the legs or arms.  Allow one hour for a Lower Venous exam.  Allow thirty minutes for an Upper Venous exam. There are no restrictions or special instructions. 4)  Your physician has requested that you limit the intake of sodium (salt) in your diet to two grams daily. Please see [facility] handout. 5)  Wear TED hose below the knee.

## 2010-09-30 ENCOUNTER — Encounter: Payer: Self-pay | Admitting: Internal Medicine

## 2010-09-30 ENCOUNTER — Encounter (INDEPENDENT_AMBULATORY_CARE_PROVIDER_SITE_OTHER): Payer: Medicare Other

## 2010-09-30 DIAGNOSIS — I2699 Other pulmonary embolism without acute cor pulmonale: Secondary | ICD-10-CM

## 2010-09-30 DIAGNOSIS — Z7901 Long term (current) use of anticoagulants: Secondary | ICD-10-CM

## 2010-10-05 NOTE — Medication Information (Signed)
Summary: coumadin ck/mt/per pt call=mj   Anticoagulant Therapy  Managed by: Weston Brass, PharmD Referring MD: Willa Rough MD PCP: Corwin Levins MD Supervising MD: Ladona Ridgel MD, Sharlot Gowda Indication 1: Pulmonary Embolism and Infarction (ICD-415.1) Lab Used: [facility] Site: [facility] INR POC 2.1 INR RANGE 2 - 3  Dietary changes: no    Health status changes: no    Bleeding/hemorrhagic complications: no    Recent/future hospitalizations: no    Any changes in medication regimen? no    Recent/future dental: no  Any missed doses?: no       Is patient compliant with meds? yes       Allergies: 1)  ! * Narcan 2)  ! Effexor 3)  ! Morphine  Anticoagulation Management History:      The patient is taking warfarin and comes in today for a routine follow up visit.  Negative risk factors for bleeding include an age less than 57 years old.  The bleeding index is 'low risk'.  Negative CHADS2 values include Age > 37 years old.  The start date was 07/09/2007.  Her last INR was 6.7 ratio.  Anticoagulation responsible provider: Ladona Ridgel MD, Sharlot Gowda.  INR POC: 2.1.  Exp: 09/2011.    Anticoagulation Management Assessment/Plan:      The patient's current anticoagulation dose is Warfarin sodium 5 mg tabs: Use as directed by Anticoagulation Clinic.  The target INR is 2.0-3.0.  The next INR is due 10/28/2010.  Anticoagulation instructions were given to patient.  Results were reviewed/authorized by Weston Brass, PharmD.  She was notified by Weston Brass PharmD.         Prior Anticoagulation Instructions: INR 3.8 Skip Today. Then change dose to 2 pills everyday except 1 pill on Mondays, Wednesdays and Fridays. Recheck in 2 weeks.   Current Anticoagulation Instructions: INR 2.1  Continue same dose of 2 tablets every day except 1 tablet on Monday, Wednesday and Friday.  Recheck INR in 4 weeks.

## 2010-10-19 LAB — BASIC METABOLIC PANEL
BUN: 8 mg/dL (ref 6–23)
CO2: 32 mEq/L (ref 19–32)
Calcium: 8.8 mg/dL (ref 8.4–10.5)
Chloride: 101 mEq/L (ref 96–112)
Creatinine, Ser: 0.67 mg/dL (ref 0.4–1.2)
Glucose, Bld: 93 mg/dL (ref 70–99)

## 2010-10-19 LAB — CBC
HCT: 34.8 % — ABNORMAL LOW (ref 36.0–46.0)
Hemoglobin: 11.5 g/dL — ABNORMAL LOW (ref 12.0–15.0)
MCH: 29.5 pg (ref 26.0–34.0)
MCHC: 33 g/dL (ref 30.0–36.0)
MCV: 89.2 fL (ref 78.0–100.0)
RDW: 14.1 % (ref 11.5–15.5)

## 2010-10-19 LAB — PROTIME-INR: INR: 2.24 — ABNORMAL HIGH (ref 0.00–1.49)

## 2010-10-28 ENCOUNTER — Ambulatory Visit (INDEPENDENT_AMBULATORY_CARE_PROVIDER_SITE_OTHER): Payer: Medicare Other | Admitting: *Deleted

## 2010-10-28 DIAGNOSIS — I2699 Other pulmonary embolism without acute cor pulmonale: Secondary | ICD-10-CM

## 2010-10-28 DIAGNOSIS — Z7901 Long term (current) use of anticoagulants: Secondary | ICD-10-CM

## 2010-10-28 NOTE — Patient Instructions (Signed)
INR 2.3  Continue current dosing schedule of 2 tablets every day, except 1 tablet on Monday, Wednesday, and Friday.  Return to clinic in 4 weeks.

## 2010-11-25 ENCOUNTER — Ambulatory Visit (INDEPENDENT_AMBULATORY_CARE_PROVIDER_SITE_OTHER): Payer: Medicare Other | Admitting: *Deleted

## 2010-11-25 DIAGNOSIS — I2699 Other pulmonary embolism without acute cor pulmonale: Secondary | ICD-10-CM

## 2010-11-25 LAB — POCT INR: INR: 2.3

## 2010-12-21 NOTE — Assessment & Plan Note (Signed)
[facility]                            CARDIOLOGY OFFICE NOTE   NAME:Brocker, ANNIEBELLE DEVORE                     MRN:          [id number]  DATE:01/04/2008                            DOB:          [birth year]    Ms. Lindenbaum is here for cardiology follow-up.  It is my understanding  from the patient that she has hip disease and needs hip surgery on  Monday.  As of today, her Coumadin is on hold.  I will check with Shelby Dubin about her Coumadin dosing.  The patient had pulmonary emboli in  the past.  We had been working hard recently to get her back on her  Coumadin.  It  is okay for her to be off it for her hip surgery and then  be returned rapidly to her anticoagulation.  The patient is not having  any chest pain.  When we assessed her in the past, she did not have  significant coronary disease.   PAST MEDICAL HISTORY:   ALLERGIES:  NARCAN and EFFEXOR.   MEDICATIONS:  Coumadin as directed, vitamins, pain meds.   OTHER MEDICAL PROBLEMS:  See the list below.   REVIEW OF SYSTEMS:  Today she seems somewhat agitated over all of the  activity that has gone on related to her hip.  Otherwise, her review of  systems is negative.   PHYSICAL EXAMINATION:  VITAL SIGNS:  Blood pressure is 100/70 with a  pulse of 63.  GENERAL APPEARANCE:  The patient is oriented to person, time and place.  Affect is as mentioned above, that she is somewhat agitated about the  planning for her hip surgery.  HEENT:  No xanthelasma.  She has normal extraocular motion.  NECK:  There are no carotid bruits.  There is no jugular venous  distention.  LUNGS:  Clear.  Respiratory effort is not labored.  CARDIAC:  Exam reveals an S1 with an S2.  There are no clicks or  significant murmurs.  ABDOMEN:  Obese.  EXTREMITIES:  She does have 1+ peripheral edema.   EKG reveals sinus rhythm.   PROBLEMS:  1. History of chronic constipation and is most likely functional, seen      by Dr.  Melvia Heaps of our group.  2. History of significant pulmonary emboli.  She needs to be on      Coumadin as much as possible.  3. Questionably abnormal CT scan.  See Dr. Marzetta Board note.  4. Very mild coronary disease by catheterization that is not a      significant problem.  5. History of right bundle branch block, although she does not have a      right bundle at this time.  6. History of severe right ventricular dysfunction associated with      pulmonary emboli in the past.  It would be helpful to do a follow-      up echo at some point.  7. 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  8. History of severe diarrhea in the past.  9. History of elevated white count in the  past.  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  She is on      folic acid and will B vitamins for this.  She needs B6 and B12 and      she is on them.  11.History of a low hemoglobin.  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  13.Question of an abnormal CT the abdomen.  14.This patient has a multitude of medical problems.  From the cardiac      viewpoint, she is actually stable.  I have encouraged evaluation of      these other areas.  15.Significant hip problem.  From the viewpoint her cardiac status.      She is stable for hip surgery.  I will recheck what has been done      over time concerning her coagulation status.  It is important that      her Coumadin be restarted as soon after her hip surgery as      possible.  It is also important that she has a primary doctor that      can look into the multitude of her other medical problems.     Luis Abed, MD, [facility]  Electronically Signed    JDK/MedQ  DD: 01/04/2008  DT: 01/04/2008  Job #: [id number]   cc:   Feliberto Gottron. Turner Daniels, M.D.

## 2010-12-21 NOTE — Discharge Summary (Signed)
[name], [name]              ACCOUNT NO.:  [id number]   MEDICAL RECORD NO.:  [id number]          PATIENT TYPE:  INP   LOCATION:  5020                         FACILITY:  [facility]   PHYSICIAN:  Feliberto Gottron. Turner Daniels, M.D.   DATE OF BIRTH:  1956/06/05   DATE OF ADMISSION:  01/07/2008  DATE OF DISCHARGE:  01/11/2008                               DISCHARGE SUMMARY   CHIEF COMPLAINT:  Right hip pain.   HISTORY OF PRESENT ILLNESS:  This is a 55 year old woman with end-stage  arthritis of the right hip.  She has two recent admissions at the [facility] Emergency Room after falling.  X-rays and CT scan were taken at her  last visit demonstrated a significant for osteoarthritis of the right  hip.  She desires surgical intervention because she is unable to control  her hip pain with antiinflammatory drugs or narcotic painkillers.   PAST MEDICAL HISTORY:  Significant for spontaneous pulmonary emboli,  last October.  She is currently maintained on large doses of Coumadin  therapy.   SOCIAL HISTORY:  She does not smoke or use alcohol.   FAMILY HISTORY:  Noncontributory.   MEDICATIONS:  1. Coumadin 15 mg 1 p.o. daily on Monday and Friday; Coumadin 10 mg      tablet 1 p.o. daily on Saturday and Sunday.  2. Folic acid.  3. Vitamin B6, vitamin B12, and vitamin C.  4. Vicodin Extra Strength tablet 1-2 tablets p.o. every 4 hours p.r.n.      pain.   ALLERGIES:  She has known drug allergies.   PHYSICAL EXAMINATION:  Examination of the right hip demonstrates that  the patient has tenderness with end point range of motion,  positive  footdrop.  She is neurovascularly intact.  Her gait is with antalgia.   Her x-ray and CT scan demonstrates bone-on-bone degenerative changes of  the right hip.   PREOPERATIVE LABS:  White blood cells 4.7, red blood cells 3.7,  hemoglobin 9, hematocrit 33.8, and platelets 173.  Sodium 142, potassium  3.2, chloride 104, glucose 154, BUN 4, and creatinine 0.54.  PT  13.1,  INR 1.0, and PTT 31.  Her urinalysis demonstrates a moderate blood and  small leukocytes.   HOSPITAL COURSE:  [name] was admitted at the [facility] on January 07, 2008, where she underwent a right total hip arthroplasty using DePuy  54-mm ASR cup with 18 x 15 x 42 x 160 S-ROM stem, and a 20D large cone.  The procedure was performed by Dr. Gean Birchwood.  Blood loss was  estimated to be 2800 mL, after she was transfused with 2 units of packed  red blood cells during the procedure.  On the first postoperative day,  she was tolerating p.o. intake well.  She complained of hip pain at  10/10.  Her hemoglobin was 9.4.  She walked 10 feet with physical  therapy and her Foley catheter was removed.  On the second postoperative  day, her hip pain was 8/10.  Her hemoglobin was 8.0.  She was transfused  with 1 unit of packed red blood cells.  She walked 60 feet with physical  therapy.  On the third postoperative day, her hip pain was 7/10.  Her  hemoglobin was found to be 7.1 and complained of feeling lethargic.  She  was transfused with 2 units of packed red blood cells.  She walked 100  feet with physical therapy.  On the fourth postoperative day, she  reported significant improvement in her hip pain.  She was ambulating  independently and tolerating p.o. intake well.  Her hemoglobin was 8.5  and she was discharged to her home.   DISPOSITION:  The patient was discharged home on January 11, 2008, home  health managed her wound, Coumadin, and physical therapy.   DISCHARGE MEDICATIONS:  As per the HMR with the addition of:  1. Percocet 5 mg tablets 1-2 tablets p.o. every 4 hours p.r.n. pain.  2. Robaxin 500 mg 1 tablet p.o. t.i.d. p.r.n. spasm.  3. Coumadin 5 mg tablet.  4. Sliding scale to maintain an INR of 1.5 to 2.0.   She would return to the clinic to see Dr. Turner Daniels in 1 week.   FINAL DIAGNOSIS:  Right hip, end-stage degenerative joint disease.   SECONDARY DIAGNOSIS:  Acute blood  loss anemia.      Shirl Harris, PA      Feliberto Gottron. Turner Daniels, M.D.  Electronically Signed    JW/MEDQ  D:  02/04/2008  T:  02/05/2008  Job:  [id number]

## 2010-12-21 NOTE — Assessment & Plan Note (Signed)
[facility]                          [facility] NOTE   NAME:[name], [name]                       MRN:          [id number]  DATE:05/28/2007                            DOB:          1956/03/26    The patient had been admitted to [facility] on the Hospitalists  service.  When we saw her she was clearly quite ill and the decision was  made to move her to [facility] for a cardiac catheterization.  She had chest pain.  Her troponins were rising.  She had ventricular  tachycardia that was limited in the hospital.  At [facility], her  catheterization showed that her right coronary artery was normal.  We  had already obtained an echo showing severe right ventricular  dysfunction.  The patient then had a CT scan that showed extensive  bilateral pulmonary emboli.  She was then treated aggressively at [facility].  She will be going home soon.  A CT of the pancreas is  pending this morning.   DISCHARGE DIAGNOSES:  1. Mild coronary artery disease by catheterization.  2. Right bundle branch block.  3. Severe right ventricular dysfunction.  We will look at her right      ventricle after another 30-60 days.  4. Chronic back pain for which she takes methadone.  5. Antibiotics; she receives Cipro and Flagyl and she will finish a 10-      day course of Flagyl.  6. Severe diarrhea.  I do not believe Clostridium difficile was ever      proven.  This did improve.  7. Elevated white count.  8. Markedly elevated homocysteine.  She is to be treated for this and      it is thought this may be the basis of her hypercoagulable state.      She needs to be on Coumadin, folic acid, B6 and B12 indefinitely.  9. Low hemoglobin being followed.  10.Coumadin therapy.  It will be very important to follow this      carefully.   The patient initially was seen by Oretha Milch, M.D. of our pulmonary  group.  He can follow her as an outpatient in the  [place] office.  Studies showed that her folic acid was normal.  B6 is pending.  B12 was  slightly low.  [id number] mutation is pending.  CA19-9 is normal.  Factor V  Leiden was normal.  When she is more stable as an outpatient we will  need to obtain antithrombin 3 level, protein C and protein S levels.  As  part of her evaluation, her venous Dopplers of her legs showed no clot.  She had Doppler of her abdomen to be sure there was no clot in her  abdomen.  There was no obvious clot seen, although, this study was not  optimal.  However, there was a mass seen in her pancreas and she is to  have a dedicated pancreatic CT done today.   She is on aspirin and Protonix.  She takes methadone by her home dose  and her Coumadin is being  given.  She is also on Cymbalta.  She is on  Pyridoxine (vitamin B6) at 12.5 mg daily.  She is on Folic acid 1 mg  daily.  She is on sienna Cobalamine (vitamin B12) 400 mcg daily.  She is  on Flagyl 500 mg q.8 hours and this will be completed for a complete 10-  day course.  She also  has received some potassium here as her potassium was low.  Her overall  status has improved greatly.     Luis Abed, MD, [facility]  Electronically Signed    JDK/MedQ  DD: 05/28/2007  DT: 05/28/2007  Job #: [id number]

## 2010-12-21 NOTE — Op Note (Signed)
[name], [name]              ACCOUNT NO.:  [id number]   MEDICAL RECORD NO.:  [id number]          PATIENT TYPE:  INP   LOCATION:  5020                         FACILITY:  [facility]   PHYSICIAN:  Feliberto Gottron. Turner Daniels, M.D.   DATE OF BIRTH:  Aug 21, 1955   DATE OF PROCEDURE:  01/08/2008  DATE OF DISCHARGE:                               OPERATIVE REPORT   PREOPERATIVE DIAGNOSES:  End-stage arthritis of right hip, morbid  obesity, and a history of pulmonary emboli, on Coumadin.   POSTOPERATIVE DIAGNOSIS:  End-stage arthritis of right hip, morbid  obesity, and a history of pulmonary emboli, on Coumadin.   PROCEDURE:  Right total hip arthroplasty using a DePuy 54-mm ASR cup, 18  x 15 x 42 x 160 S-ROM stem, 20D large cone, NK+0 47 mm ultimate ball.   SURGEON:  Feliberto Gottron.  Turner Daniels, MD   FIRST ASSISTANT:  Shirl Harris, PA-C.   ANESTHESIA:  General endotracheal.   ESTIMATED BLOOD LOSS:  2800 mL.   FLUID REPLACEMENT:  2 units of packed red blood cells, 1 unit of Hespan,  and 3 liters of normal saline solution.   DRAINS PLACED:  Medium Hemovac and Foley catheter.   URINE OUTPUT:  About 300 mL.   INDICATIONS FOR PROCEDURE:  A 55 year old woman with end-stage arthritis  of her right hip, who has had 2 visits recently at the [facility] Emergency  Room after falling.  X-rays of her right hip show end-stage arthritis  and possible compression fracture of the femoral head, although CT  scanning x2 has not shown the compression fracture.  It had shown  consistent end-stage arthritis; and because of pain, it is severe enough  to make her fall requiring ambulance transport x2 now, and x-ray showing  bone-on-bone arthritic changes.  She desires elective right total hip  arthroplasty.  Her medical history is complicated by history of  spontaneous pulmonary emboli last October.  She is on Coumadin large  doses 15 mg a day, Monday through Friday, then 10 mg a day on Saturday  and Sunday, which is required to  get her INR up to 2.3.  We did obtain  medicine consultation prior to her surgery to make sure everything that  could be done was accomplished to minimize her risk of surgery.  Her  Coumadin was stopped on Thursday 4 days prior to the surgery, and she  had a normal INR on the morning of surgery.   DESCRIPTION OF PROCEDURE:  The patient was identified by armband, taken  to the operating room at [facility], where the appropriate  anesthetic monitors were attached, and general endotracheal anesthesia  was induced.  She was then rolled into the left lateral decubitus  position and fixed there with a Stulberg Mark II pelvic clamp.  She did  receive 2 g of Ancef preoperatively.  A Foley catheter was inserted, and  the right lower extremity was then prepped and draped in usual sterile  fashion from the ankle to the hemipelvis.  The skin along the lateral  hip and thigh was infiltrated with 20 mL of 0.5% Marcaine  and  epinephrine solution, and then a posterolateral approach to the hip was  made utilizing a 20-cm incision centered over the greater trochanter.  Small bleeders in the skin and subcutaneous tissue were identified and  cauterized, and it was immediately noted that even her fat would ooze  blood throughout the case.  The IT band was then cut in line with the  skin incision identifying the greater trochanter and cobra retractors  were placed beneath the gluteus minimus and on top of the superior hip  joint capsule and beneath the quadratus femoris and apposed to the  inferior hip joint capsule.  This isolated the short external rotators  and piriformis, which were then cut off, their insertion on the  intertrochanteric crest, after being tagged with a #2 Ethibond suture.  The hip joint capsule was likewise unexposed and developed into an  acetabular-based flap and likewise tagged with two #2 Ethibond sutures.  By now, it was noted that the muscle even the ligament tissue was  oozing  blood; and although we looked carefully for any significant bleeders,  there was nothing to cauterize, it was mainly capillary bleeding  although it was significant.  At that point, I instructed them to type  and cross 2 units of packed cells and to stay 2 units ahead on the type  and cross as well.  At this point, the hip was flexed and internally  rotated dislocating the femoral head.  A standard neck cut was performed  one fingerbreadth above the lesser trochanter, and once again even the  marrow continued to ooze blood.  The proximal femur was then translated  anteriorly using a Hohmann retractor on the anterior column.  A  posteroinferior wing retractor was placed enhancing exposure of the  acetabulum; and finally, a cobra retractor was placed in the cotyloid  notch, and a superior and inferior soft tissues were packed with sponges  to slow down the oozing of blood.  Labrectomy was then performed.  We  reamed up to a 53-mm basket reamer obtaining good coverage in all  quadrants just into the subchondral bone.  We just reamed the rim with a  54-mm basket reamer, selected a 54-mm ASR cup and hammered it into place  and 45 degrees of abduction and about 20 degrees on anteversion.  Good  fit and fill was accomplished.  Hip was then flexed and internally  rotated.  We entered the proximal femur with the box cutting chisel,  followed by the initiating reamer, followed by axial reaming up to a 15-  mm reamer, where we obtained good shatter; and during the reaming, the  femoral canal oozed with blood such that it came up the canal every time  we took the reamer out.  We then reamed to 15.5 to the full depth and 16  mm to the proximal half of the femur, followed by conical reaming up to  a 20D cone to the appropriate depth for a 42 neck and calcar milling to  a 20D large cone.  A 20D large trial was selected, hammered into place,  followed by a trial stem with a 42-base neck in the  same version as the  calcar about 20 degrees, NK+0 47 mm ball was placed in the trial, and  the hip reduced.  The hip came to full extension with the knee flexed at  90 degrees, could not be dislocated anteriorly.  We could flex the hip  to 90 degrees and internally rotate  to 60 before there was any  instability, and the hip was felt to be quite stable.  At this point,  the trial components were removed.  We hammered into place a 20D large  ZTT1 cone, followed by a 20 x 15 x 42 x 160 S-ROM stem and about 5  degrees more anteversion than the cone.  It seated nicely.  An NK+0 47  mm ultimate ball was hammered onto the stem, and the hip reduced.  The  oozing of blood continued.  By this time, the patient was receiving her  second unit of packed red blood cells; and the blood loss was up to  around 2100 mL.  Satisfied with the position of the stem, we then  irrigated out with normal saline solution, repaired the capsular flap  and the piriformis back to the intertrochanteric crest through drill  holes using #2 Ethibond suture.  A medium Hemovac drain was placed deep  in the wound, and the IT band was closed with running #1 Vicryl suture.  At this time, the oozing seemed to decrease dramatically.  The  adipose  tissue layer was irrigated out normal saline solution, and then closed  with 2 layers of 0 Vicryl suture, a single layer of 2-0 Vicryl suture,  and then the skin only was closed with skin staples.  A dressing of  Mepilex was then applied.  The patient was unclamped, rolled supine,  awakened, and taken to the recovery room, where x-rays revealed well-  placed, well-fixed S-ROM stem and ASR cup.  In the recovery room, she  was neurovascularly intact, awakened, and alert.      Feliberto Gottron. Turner Daniels, M.D.  Electronically Signed     FJR/MEDQ  D:  01/09/2008  T:  01/10/2008  Job:  [id number]

## 2010-12-21 NOTE — Assessment & Plan Note (Signed)
[facility]                          [facility] NOTE   NAME:Varma, NOMI RUDNICKI                     MRN:          [id number]  DATE:05/29/2007                            DOB:          06/28/1956    See my extensive note updating the hospital course of this patient,  dated May 28, 2007.  She is going home today.  Since yesterday she  had a CT of the abdomen showing there was no mass in her pancreas.  However, she does have a hypodense lesion in the posterolateral aspect  of the right kidney.  This will need to be followed up over time also.   In addition there were some other labs that I probably did not mention  yesterday including a lupus anticoagulant and eventually we will have to  rereview [facility] records for all of the labs.  She will be seen back in  [place].  She does not have a primary physician.  She lives in [place].  She hopes to move back to [place].  When I see her in [place], I will  try to help coordinate her care over time.  We decided to send her home  on aspirin and Coumadin, although she may not need aspirin in the  future.  An additional problem was the chest CT showing question of a  diffuse lymphoproliferative disorder and this will need follow-up.  I  could ask for help with this with Dr. Vassie Loll of our pulmonary team.  Therefore the original list from yesterday includes:  1. Diffuse lymphoproliferative disorder.  2. No mass in the pancreas.  3. Hypodense lesion in the posterolateral aspect of the right kidney      which will need follow up in the future.     Luis Abed, MD, [facility]  Electronically Signed    JDK/MedQ  DD: 05/29/2007  DT: 05/30/2007  Job #: [id number]

## 2010-12-21 NOTE — Assessment & Plan Note (Signed)
[facility]                            CARDIOLOGY OFFICE NOTE   NAME:Pechacek, AERON LHEUREUX                     MRN:          [id number]  DATE:07/13/2007                            DOB:          04-03-1956    Ms. Meda is here for cardiology followup.  She has a multitude of  medical problems.  We had seen her at [facility].  Ultimately  she was transferred to [facility] for complete evaluation.  She had  rising troponins at [facility].  She has ventricular tachycardia and I  transferred her for cath.  At [facility] her coronaries were normal.  An echo  showed severe right ventricular dysfunction and a CT scan showed  extensive bilateral pulmonary emboli.  She was treated aggressively and  improved.  She was then discharged home.  She had scheduled appointment  in our [place] office but did not come.  She has been moved to [facility]  by her family and is now here in the [place] office.  She has chronic  low back pain and takes methadone and other medicines.  She had severe  diarrhea while in the hospital but this improved ultimately.  She has  markedly elevated homocysteine.  This is thought to be a possible basis  of her hypercoagulable state.  She needs to be on Coumadin, folic acid,  B6 and B12 indefinitely.  Her hemoglobin was low and this is to be  followed.  She has also seen Dr. Cyril Mourning of our pulmonary group.  She was to have followup with him also.  Her venous Dopplers showed no  clots.  A Doppler of her abdomen was done to be sure there was no clot  in her inferior vena cava and there was none seen.  It was not an  optimal study.  She had a dedicated CT scan of the pancreas.  Fortunately, this showed no obvious pancreatic mass.  There was a  hypodense lesion in the right kidney.  It has most likely represented  old pyelonephritis or infarction.   Today she is here.  Her breathing is stable.  She has continued pain in  her back and her  leg.  Unfortunately, she has absolutely no money.  She  has run out of Coumadin which is a life-threatening situation for her.  We are trying to find Coumadin samples for her and help any way we can  arranging Coumadin treatment and followup for her.  This will be quite  difficult.  She can get Coumadin for a very reduced rate and hopefully  this can be worked out.   PAST MEDICAL HISTORY:  ALLERGIES:  NARCAN?, EFFEXOR?.   MEDICATIONS:  1. Coumadin.  2. B6.  3. Folic acid.  4. B12.  5. Cymbalta.  6. Neurontin.  7. Aspirin.   She is out of many of her medicines.   OTHER MEDICAL PROBLEMS:  See the complete list below.   PHYSICAL EXAMINATION:  Weight is 226 pounds, blood pressure 129/90 with  a pulse of 65.  Patient is oriented to person, time and place.  Affect  is  normal.  She is here with a family member today.  LUNGS:  Clear, respiratory effort is not labored.  CARDIAC:  Reveals an S1 with an S2, there are no clicks or significant  murmurs.  ABDOMEN:  Soft.  She has mild peripheral edema.   PROBLEMS:  1. Mild coronary disease by catheterization, not a significant      problem.  2. Right bundle branch block.  3. Severe right ventricular dysfunction associated with her pulmonary      embolus and this can be rechecked at a later date.  4. 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  Despite      this, she still has pain.  She will need a pain management clinic      of some kind.  5. Status post severe diarrhea.  C. diff was never proven but it was      assumed and she was treated with Cipro and Flagyl and her      antibiotics are completed.  6. History of an elevated white count.  7. Markedly elevated homocysteine.  This may be the basis of her      hypercoagulable state.  She needs to be on Coumadin, folic acid, B6      and B12 indefinitely.  Her B6 (pyridoxine) should be 12.5 mg daily.      Her folic acid should be 1 mg daily.  B12 (cobalamin) should be  400      mcg daily.  8. History of a low hemoglobin.  9. Coumadin therapy.  We will do what we can to help her get her      Coumadin and have it watched.  10.Original presentation with elevated troponins and some ST      elevation.  However, she had minimal coronary disease and we      believe this was all related to her pulmonary emboli.  11.Question of a diffuse lymphoproliferative disorder.  The CT of the      chest showed diffuse numerous prominent mediastinal lymph nodes      that was suspicious for lymphoproliferative disorder.  12.Right bundle branch block.  13.Hypodense area at the head of the pancreas.  CT scan of the abdomen      however showed no marked pancreatic abnormality.  There was a      hypodense area also in the posterior lateral right kidney that most      probably represented pyelonephritis or infarction.   Lab studies concerning her hypercoagulable state include:  1. Lupus anticoagulant, PT-TLA 200.  I have questioned elevated PT-      TLA.  2. Lupus anticoagulant detected.  3. Cancer antigen 125 that was done, also cancer antigen 19-9.  At      some point there will have to be complete re-review of all of her      labs from [facility] and specifically her hypercoagulable labs for      further evaluation.  4. Left ventricular ejection fraction of 50%, at a later date we will      need to obtain protein C and protein F.  We will need to assess      further a question of lymphoproliferative disorder.  She needs      antithrombin III level and I will ask Dr. Vassie Loll for help with the      anticoagulation assessment.   The key issues for this patient at this time is for her to be on  Coumadin along with vitamin B6 and vitamin B12 and folic acid.  We will  help any way possible.     Luis Abed, MD, [facility]  Electronically Signed    JDK/MedQ  DD: 07/13/2007  DT: 07/13/2007  Job #: [id number]

## 2010-12-21 NOTE — Assessment & Plan Note (Signed)
[facility]                         GASTROENTEROLOGY OFFICE NOTE   NAME:[name], [name]                     MRN:          [id number]  DATE:11/29/2007                            DOB:          Dec 28, 1955    PROBLEM:  Constipation.   HISTORY OF PRESENT ILLNESS:  Ms. Rigdon is a 55 year old white female,  referred through the courtesy of Dr. Chevis Pretty for evaluation.  She has  suffered from chronic constipation for years.  Without the help of a  cathartic, she may go up to a week without a bowel movement.  With the  start up of bowel movements, she may have minimal rectal bleeding  consisting of small amounts of blood in the toilet water.  She regularly  uses Dulcolax and Milk of Magnesia.  She is on Coumadin for pulmonary  emboli, and that was diagnosed in October 2008.  Coincidentally noted  was dilatation of her intra and extrahepatic bile ducts.  The common  bile duct measured up to 15 mm.  An ultrasound raised the question of a  mass in the head of the pancreas, but this was not verified by CT.  Ms.  Zapanta also suffers from GERD, which is well controlled with diet.  She  is minimally symptomatic.   PAST MEDICAL HISTORY:  Pertinent for pulmonary emboli for which she is  on Coumadin.  She has arthritis.  She suffers from depression.  She has  a history of kidney stones, interstitial cystitis and recurrent UTIs.  She is status post bariatric surgery in 1982, and cholecystectomy and  tonsillectomy.   FAMILY HISTORY:  Pertinent for a sister with Crohn's disease.   MEDICATIONS:  Coumadin, methadone, folate, baby aspirin and oxycodone  p.r.n.   ALLERGIES:  NARCAN AND EFFEXOR.   She smokes about one-third of a pack a day.  She does not drink.  She is  divorced and is on disability for chronic back pain and mental health  issues.   REVIEW OF SYSTEMS:  Positive for anxiety, back pain, nose bleeds while  on Coumadin, feet swelling, excessive thirst,  excessive urination and  urinary leakage.  She has also noted some leakage of stool when she has  taken MiraLax.   PHYSICAL EXAMINATION:  GENERAL:  She is a healthy appearing female that  appears older than her stated age.  VITAL SIGNS:  Pulse 64, blood pressure 112/70, weight 227.  HEENT: EOMI.  PERRLA.  Sclerae are anicteric.  Conjunctivae are pink.  NECK:  Supple without thyromegaly, adenopathy or carotid bruits.  CHEST:  Clear to auscultation and percussion without adventitious  sounds.  CARDIAC:  Regular rhythm; normal S1 S2.  There are no murmurs, gallops  or rubs.  ABDOMEN:  Bowel sounds are normoactive.  Abdomen is soft, nontender and  nondistended.  There are no abdominal masses, tenderness, splenic  enlargement or hepatomegaly.  EXTREMITIES:  She has chronic venous stasis changes.  There is trace  pedal edema.  RECTAL:  Deferred.  SKELETAL:  There are no deformities.  NEUROLOGIC:  She is nonfocal and alert.   IMPRESSION:  1. Chronic constipation.  This is most likely functional.  2. History of pulmonary emboli - on coumadin.3  3. Gastroesophageal reflux disease - minimally symptomatic.  4. Abnormal CT of the bile duct, a 15 mm bile duct is larger than      expected post cholecystectomy (normal up to 11 mm).  This at least      raises the question of extrahepatic duct bile duct obstruction.   RECOMMENDATIONS:  1. Colonoscopy.  If no abnormalities are seen to explain her      constipation, then I will work with her on a new regimen.  2. Check LFTs.  If they are normal, I do not think we need to pursue      any further workup for her dilated bile ducts.  If she is      cholestatic, then I would consider MRCP.     Barbette Hair. Arlyce Dice, MD,FACG  Electronically Signed    RDK/MedQ  DD: 11/29/2007  DT: 11/29/2007  Job #: [id number]   cc:   Leatha Gilding. Mezer, M.D.  Oretha Milch, MD

## 2010-12-21 NOTE — Cardiovascular Report (Signed)
NAMESHARINA, [name]              ACCOUNT NO.:  [id number]   MEDICAL RECORD NO.:  [id number]          PATIENT TYPE:  INP   LOCATION:  2909                         FACILITY:  [facility]   PHYSICIAN:  Veverly Fells. Excell Seltzer, MD  DATE OF BIRTH:  07-Oct-1955   DATE OF PROCEDURE:  05/21/2007  DATE OF DISCHARGE:                            CARDIAC CATHETERIZATION   PROCEDURES:  Right heart catheterization, left heart catheterization,  selective coronary angiography with left ventricular angiography.   INDICATIONS:  [name] is a 55 year old woman who was admitted to  [facility] yesterday.  She was admitted with a an accidental  narcotic overdose.  She has a syndrome of chronic low back pain and  chronic narcotic use.  She became hemodynamically unstable and developed  chest pain and dyspnea.  Her troponin and CK-MB values were increasing.  She was seen emergently by Dr. Myrtis Ser who performed a stat echocardiogram  that demonstrated right ventricular failure.  She was transferred  emergently for cardiac catheterization to rule out acute right  ventricular infarct.   Risks and indications for the procedure were reviewed with the patient.  Emergency consent was obtained as the patient was unable to give  informed consent in the setting of narcotics on board.  The right groin  was prepped, draped, anesthetized with 1% lidocaine.  Using the modified  Seldinger technique, a 6-French sheath was placed in the right femoral  artery.  The coronary arteries were imaged using standard, 6-French  Judkins catheters.  Following selective coronary angiography, an angled  pigtail catheter was inserted into the left ventricle where pressures  were recorded.  Left ventriculogram was performed and a pullback across  the aortic valve was done.  The diagnostic left heart catheterization  demonstrated normal coronary arteries.  I elected to perform a right  heart catheterization to better define her hemodynamic  status.  A 7-  French sheath was placed in the right femoral vein using the modified  Seldinger technique.  This was done without difficulty.  A Swan-Ganz  catheter was advanced with pressures recorded throughout the right heart  chambers.  Oxygen saturations were drawn from the pulmonary artery and  the central aorta.  At the completion of the procedure, the patient was  sent emergently to radiology for stat CT scan to rule out pulmonary  embolism as her hemodynamics were also suggestive of acute right  ventricular failure and we had no obvious etiology.   FINDINGS:  Right atrial pressure mean of 22, right ventricular pressure  39/23, pulmonary artery pressure 37/25 with a mean of 30, pulmonary  capillary wedge pressure mean of 22, left ventricular pressure 79/10,  aortic pressure 78/60 with a mean of 67.   Oxygen saturation pulmonary artery pressure 47%.  Aortic pressure 90%.   Cardiac output by the Fick method 2.8 L per minute.  Cardiac index 1.3  L/sq m.   Coronary angiography:  The left mainstem is angiographically normal and  bifurcates into the LAD and left circumflex.   The LAD is a large-caliber vessel that courses down and wraps around the  LV apex.  There  is a large first diagonal branch as well as a large  first septal perforator.  There is no significant angiographic stenosis  throughout the LAD.   The left circumflex is a large vessel.  It courses down and supplies a  medium-size, first OM branch which branches itself into twin vessels.  The AV groove circumflex courses down and supplies two small second and  third OM branches.  There is no significant angiographic stenosis  throughout the left circumflex.   The right coronary artery is dominant.  It is a very large vessel.  It  courses down and supplies a large, right posterolateral branch and a  medium-size PDA branch.  There is minimal plaque in the mid-right  coronary artery, but no significant angiographic  stenoses.   Left ventricular function assessed by ventriculography is normal to  hyperdynamic.  The LVEF is estimated at 65-70%.  There is mitral  regurgitation only with ectopic beats.  I do not think there is  pathologic mitral regurgitation present.   ASSESSMENT:  1. Minimal coronary artery disease.  2. Normal left ventricular systolic function.  3. Elevated right heart filling pressures with evidence of right heart      failure.  4. Low cardiac output.  5. Systemic hypotension, suspect cardiogenic shock from right heart      failure.   DISCUSSION:  The etiology of [name] acute right heart failure is  unclear.  I think at this point the top of the differential would be a  pulmonary embolus.  She was transferred immediately from the cardiac  catheterization lab to radiology for a stat CT of her chest.  She will  then be transported to the CCU.  Will continue aggressive IV fluids to  help support the right ventricle.  We will have to watch [name]  very carefully based on her hemodynamic parameters.  I am going to ask  for a consultation from our critical care service for their assistance  in management.      Veverly Fells. Excell Seltzer, MD  Electronically Signed     MDC/MEDQ  D:  05/21/2007  T:  05/22/2007  Job:  [id number]

## 2010-12-21 NOTE — Assessment & Plan Note (Signed)
[facility]                            CARDIOLOGY OFFICE NOTE   NAME:[name], [name]                     MRN:          [id number]  DATE:08/13/2007                            DOB:          June 23, 1956    [name] is here for Cardiology followup.  I saw her last on July 13, 2007.  I was very concerned about her being off Coumadin and we were  able to get it restarted.  She was on Lovenox and then Coumadin and now  only Coumadin.  We know that she has mild coronary disease but that this  is not a significant problem.  We know she had severe right ventricular  dysfunction associated with her pulmonary emboli.  She has seen Dr. Vassie Loll  of our pulmonary team who is arranging for further follow-up.  She does  seem depressed today.  She has been off her Cymbalta which she cannot  afford.   PAST MEDICAL HISTORY:  ALLERGIES:  NARCAN AND EFFEXOR.   MEDICATIONS:  Coumadin, multivitamin, low-dose aspirin.   OTHER MEDICAL PROBLEMS:  See the extensive list on my note of July 13, 2007   REVIEW OF SYSTEMS:  See the HPI.   PHYSICAL EXAM:  Blood pressure is 118/80.  Pulse of 76.  The patient is  oriented to person, time and place.  Affect reveals that she does appear  to be depressed.  HEENT:  Reveals no xanthelasma.  She has normal extraocular motion.  There are no carotid bruits.  There is no jugular venous distention.  LUNGS:  Reveal no rales today.  CARDIAC:  Reveals an S1 with an S2.  There are no clicks or significant  murmurs.  ABDOMEN:  Soft.  She does have some peripheral edema.   PROBLEMS:  Are listed extensively in my note of July 13, 2007.  She  has a very complicated overall medical status.  Her cardiac status at  this time is stable.  The key will be her Coumadin therapy with follow-  up with Dr. Vassie Loll concerning her pulmonary status and her clotting  tendencies.  At some time in the future it will be helpful for her to  have a  follow-up 2D echo.  I will see her back for cardiology follow-up  in 3-6 months.     Luis Abed, MD, [facility]  Electronically Signed    JDK/MedQ  DD: 08/13/2007  DT: 08/13/2007  Job #: [id number]

## 2010-12-21 NOTE — Discharge Summary (Signed)
NAMEJOLYNDA, [name]              ACCOUNT NO.:  [id number]   MEDICAL RECORD NO.:  [id number]          PATIENT TYPE:  INP   LOCATION:  5022                         FACILITY:  [facility]   PHYSICIAN:  Feliberto Gottron. Turner Daniels, M.D.   DATE OF BIRTH:  10-18-1955   DATE OF ADMISSION:  01/01/2008  DATE OF DISCHARGE:  01/02/2008                               DISCHARGE SUMMARY   CHIEF COMPLAINT:  Right hip pain.   HISTORY OF PRESENT ILLNESS:  This is a 55 year old lady who fell in her  home this afternoon and injured her right hip.  She presented to the  [facility] Emergency Room for evaluation.   PAST MEDICAL HISTORY:  Significant for multiple pulmonary emboli, and  she is currently maintained on Coumadin therapy for this.   PAST SURGICAL HISTORY:  Includes a right shoulder rotator cuff repair in  2004.   SOCIAL HISTORY:  She does not drink alcohol.  She smokes 1/3 of pack of  cigarettes a day.   FAMILY HISTORY:  Noncontributory.   MEDICATIONS:  1. Coumadin 15 mg 1 p.o. daily Monday through Friday.  2. Coumadin 10 mg 1 p.o. daily Saturday and Sunday.  3. Folate 5 mg p.o. daily.  4. Vitamin C 500 mg p.o. daily.   ALLERGIES:  She has no known drug allergies.   PHYSICAL EXAMINATION:  Examination of the right hip demonstrates the  patient to have a positive foot drop.  She has tenderness with internal  and external rotation.  She is neurovascularly intact.   DIAGNOSTIC DATA:  X-ray demonstrates severe osteoarthritis.   LABORATORY DATA:  White blood cells 4.7, red blood cells 3.7, hemoglobin  11.9, hematocrit 33.8, and platelets 153.  PT 16.8.  INR 1.3.  Sodium  142, potassium 3.2, chloride 104, glucose 154, BUN 4, and creatinine  0.54.  Her urinalysis was within normal limits.   HOSPITAL COURSE:  [name] presented to the [facility] Emergency Room  on Jan 01, 2008, after sustaining a fall.  X-rays taken in the emergency  room demonstrated severe osteoarthritis with a possible fracture.   She  was admitted to the orthopedic floor after being evaluated by Dr. Gean Birchwood.  During her stay, a CT scan of the right hip was obtained that  demonstrated severe arthropathy of the right hip with no fracture.  CT  of the chest was taken and demonstrated no emboli.  On Jan 02, 2008, [name] was counseled about the need for a total hip arthroplasty to  prevent future falls.  She elected to go home and returned the following  Monday to proceed with surgery.   DISPOSITION:  The patient will be discharged home on Jan 02, 2008.  She  will be using a wheelchair for the next several days as a precaution to  prevent another fall.   DISCHARGE MEDICATIONS:  Discharge medications will be as per the HMO  with the addition of Vicodin extra-strength tablets, 1-2 tablets p.o.  q.4 h. p.r.n. pain.  The patient will return to [facility] on  Monday for right total hip arthroplasty.  FINAL DIAGNOSIS:  Right hip end-stage degenerative joint disease.      Shirl Harris, PA      Feliberto Gottron. Turner Daniels, M.D.  Electronically Signed    JW/MEDQ  D:  02/01/2008  T:  02/01/2008  Job:  [id number]

## 2010-12-21 NOTE — Discharge Summary (Signed)
[name], BEHRLE              ACCOUNT NO.:  [id number]   MEDICAL RECORD NO.:  [id number]          PATIENT TYPE:  INP   LOCATION:  2028                         FACILITY:  [facility]   PHYSICIAN:  Luis Abed, MD, FACCDATE OF BIRTH:  [birth year]   DATE OF ADMISSION:  05/21/2007  DATE OF DISCHARGE:                         DISCHARGE SUMMARY - REFERRING   DISCHARGE DIAGNOSES:  1. Extensive bilateral pulmonary emboli resulting in severe right      ventricular dysfunction.  2. Right ventricular dysfunction.  3. Chronic back pain.  4. Abnormal troponins without evidence of coronary artery disease on      catheterization.  5. Diarrhea of uncertain etiology (recent antibiotics for arm; however      Clostridium difficile was negative).  6. Elevated white blood cells.  7. Normocytic anemia.  8. Hypercoagulable state.  9. 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  10.Hypokalemia.  11.Hypotension.  12.Right bundle-branch block.  13.History as previously.   PROCEDURES PERFORMED:  Cardiac catheterization on May 21, 2007, by  Dr. Diona Browner.   REFERRING PHYSICIAN:  Dr. Francia Greaves.  The patient does not have  primary care physician.   [name] is a 55 year old female who was hospitalized at [facility] on May 20, 2007, when she presented to the emergency room  with lethargy.  Apparently she has been trying to do a lot of activities  and took an excessive amount of Vicodin for her back discomfort.  In the  emergency room she received Narcan.  Cardiology was consulted secondary  to her chest discomfort and increasing troponins at [facility]   At [facility] it was noted that she was having ventricular ectopy  and three- to five-beat runs of ventricular tachycardia with a rate  ranging from 100-150.  Her potassium was in the middle 2's and with  supplementation increased to 3.7.  Her EKG at the time showed right  bundle-branch block, and troponins and MBs were abnormal.  Echocardiogram showed good LV function.  However, wall motion  abnormalities were not specific.  Her right heart was dilated with  marked decrease in RV function.  She was also having marked diarrhea  with negative C. difficile, as well as leukocytosis and a left shift.  She was started on Cipro and Flagyl.   Her past medical history is also notable for cholecystectomy, right  shoulder discomfort, GI stapling procedure in the past, tobacco use,  right bundle-branch block.   Dr. Myrtis Ser, after evaluating her at [facility], felt that with her  discomfort and her abnormal enzymes she should be transferred to [facility]  for further evaluation.   LABORATORY DATA:  At [facility] on October 13, x-ray showed low-  volume chest film with areas of atelectasis, left IJ catheter tip in the  brachiocephalic SVC junction.  CT of the chest showed acute extensive  bilateral pulmonary emboli, dependent bibasilar atelectasis with small  effusions, dilated right atrium and ventricle consistent with right  heart strain, diffuse numerous prominent mediastinal lymph nodes  suspicious for a lymphoproliferative process.  Abdominal one-view films  showed mild gaseous distention of the colon without bowel obstruction.  Complete abdominal ultrasound on October 16 showed normal intrahepatic  IVC; the mid and distal IVC could not be visualized secondary to bowel  gas, consider MR venogram; hypoechoic mass-like area of the head of the  pancreas, recommend further evaluation with dedicated pancreatic mass  protocol CT with contrast on a nonemergent basis; cholecystectomy with  intra and extrahepatic ductal dilatation, no definitive stones were  visualized; bilateral pleural effusions.  Abdominal CT on October 20  showed no evidence for pancreatic mass, previous cholecystectomy with  mildly dilated common bile duct; small left pleural effusion; hypodense   peripheral are associated with the posterolateral right kidney most  likely representing an area of pyelonephritis or infarction.  At [facility] admission H&H on October 13 was 13.3 and 39.5, normal indices,  platelets 225, WBC's 8.9.  During her hospitalization, H&H dropped on  October 16 to 9.8 and 29.4, normal indices, platelets 112, WBC's 6.6.  On October 20, H&H was 10.9 and 32.4, normal indices, platelets 205,  WBC's 5.3.  Admission to [facility] PTT was 39 with a PT of 15.1, INR of 1.2.  On October 16, INR was 1.5; on October 17, 2.2; on October 18, 2.6; on  October 19, 2.8; on October 20, 3.1; and on October 21, 3.0.  Lupus  anticoagulant on October 15 revealed an elevated PTTLA at 200, an  elevated PTTLA confirmation at 25.5, an elevated PTTLA at 4:1 ratio mix  at 125.8, an elevated DRVVT at 51.4.  Lupus anticoagulant was detected.  The DRVVT 1:1 mix was within normal limits at 42.2.  On admission at  [facility] on October 13, sodium was 135, potassium 3.2, BUN 9, creatinine  0.63, glucose 114.  AST and ALT were slightly elevated at 84 and 55,  albumin 5.1, calcium 7.7, albumin 2.5, and total protein 5.1.  Potassium  reached a low of 3.0 on October 14.  AST and ALT had improved to 54 and  42 respectively on October 14.  On October 20, sodium was 146, potassium  4.0, BUN 2, creatinine 0.54, glucose 73, and calcium 8.1.  Lactic acid  on admission was 2.2.  CK, MBs and relative indexes on admission were  217, 15.6 and 7.2.  Subsequent CK, MBs and relative index were  declining.  Troponin was 2.95 and subsequent troponins were declining.  Fasting lipids on October 18 showed a total cholesterol 79,  triglycerides 49, HDL 37, LDL 32.  On October 17, vitamin B12 was low at  186 with a normal serum folate and rbc folate.  On October 18, iron was  low at 11, TIBC low at 145, percent saturation low at 8, vitamin B12 at  that point was 618 and ferritin was 124.  Cancer antigen 125 on October  15 was  21.7 and cancer antigen-GI 19-9 was 10.1 on October 18.  Homocystine on October 15 was elevated at 69.  Heparin antibody screen  on October 16 was negative.  Stools were heme positive on October 15,  16, 18, and 20.  C. difficile on October 15 was negative.  Stool culture  on October 14 was unremarkable.  Fecal lactoferrin was positive on  October 14.  EKGs throughout her admission showed sinus tachycardia,  normal sinus rhythm, left posterior fascicular block, delayed R wave.  Subsequent EKG showed normal sinus rhythm, left axis deviation, right  bundle-branch block, nonspecific ST-T wave changes.   HOSPITAL COURSE:  Dr. Myrtis Ser  transferred Ms. Apodaca to [facility] for  further evaluation on May 21, 2007.  Catheterization was performed  by Dr. Excell Seltzer.  This showed minimal coronary artery disease with some  plaquing in the RCA with normal LV function.  She had elevated right  heart pressures with evidence of right heart failure and systemic  hypotension.  STAT CT of the chest was ordered to rule out pulmonary  embolism and she was transferred to the CCU with critical care  management consult.  CT was performed and Dr. Excell Seltzer received  information in regards to large bilateral pulmonary embolism.  Her  sheaths were discontinued and heparin was resumed, and Coumadin was  started that evening.  Pharmacy assisted with Coumadin and heparin  management.  Tobacco cessation consult was performed.  Dr. Excell Seltzer, on  October 14, recommended continued fluid support to maintain preload.  An  echocardiogram was performed on October 14.  This revealed an EF of 50%,  aortic valve calcification with trivial AI, mild left atrial  enlargement, markedly dilated right ventricle with decreased RV  function, moderate TR, moderate right atrial enlargement, cor pulmonale  based on flattened septum, and severe RV dysfunction.  It was felt that  these changes were secondary to her pulmonary embolism.  By  October 15  the patient stated that she was feeling slightly better but complained  of abdominal discomfort; thus, ultrasounds and CTs were ordered.  Chest  x-ray noted diffuse proximal mediastinal lymph nodes and it was felt  that this should be reevaluated in a of couple months.  Lavella Hammock  stated that she should follow up with Dr. Vassie Loll.  Diarrhea had improved.  By October 16, the patient was feeling much better.  Dr. Myrtis Ser noted that  her IV heparin should be overlapped with Coumadin for at least 3 days.  Her low potassium was supplemented.  Progression nurse assisted with  discharge needs. Dr. 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  Folic acid, B6 and B12  were added to her medical regimen, as well as following up on lupus  anticoagulate and other studies.  Even though C. difficile was negative,  pulmonary felt that she should continue at least 10 days of Flagyl.  It  was felt that her heme-positive stools were probably secondary to her  increased diarrhea.  These were followed.  CT of the abdomen did not  show any pancreatic abnormalities.  By October 20, Dr. Myrtis Ser began making  discharge plan.  Physical therapy assisted with ambulation and  education.  With resulting blood work it was felt that she had a  hypercoagulable state and must be maintained on Coumadin as well as  folate, B12 and B6.  Dr. Myrtis Ser recommended as an outpatient to obtain a  T3, protein C and S, and was questioning if there was diffuse  lymphoproliferative disorder on original chest CT, and felt that she  would need followup for this.  Dr. Myrtis Ser noted her factor V Leiden was  negative.  By October 21, Dr. Myrtis Ser felt given her INR 3.0 with a several-  day heparin overlap, it was felt that she could be discharged home.   DISPOSITION:  She was asked to maintain low-sodium heart-healthy diet.  Wound care is not applicable.  She was asked to increase her  activity  slowly.   New medications include:  1. Coumadin 5 mg tablets, 2.5 mg daily except for 5 mg Monday,  Wednesday and Friday with anticipated target INR between 2.5 and 3.  2. Flagyl 500 mg t.i.d. until October 24.  3. Vitamin B6 12.5 mg daily.  4. Folic acid 1 mg daily.  5. Vitamin B12 400 mcg daily.   She was given permission to continue:  1. Methadone 20 mg b.i.d. or as needed.  2. Cymbalta 60 mg b.i.d.  3. Neurontin 600 mg four times a day.  4. Aspirin 81 mg daily.   She will have a PT/INR in the [place] office on Friday October 24 at 9:15  a.m.  She was asked to obtain a primary care physician, to bring all  medications to all appointments.  She will follow up with Dr. Myrtis Ser on  June 15, 2007, at 1:15 p.m. in the [place] office.  At the time of  followup, according to the progress notes, Dr. Myrtis Ser feels that she  needs followup on the original chest CT, an outpatient ATIII (his  writing may depict a V), protein C and S.  She was also asked to arrange  followup with pulmonologist, Dr. Vassie Loll, in [place].   Discharge time 75 minutes.      Joellyn Rued, PA-C      Luis Abed, MD, [facility]  Electronically Signed    EW/MEDQ  D:  05/29/2007  T:  05/29/2007  Job:  [id number]   cc:   Francia Greaves, MD  Oretha Milch, MD

## 2010-12-23 ENCOUNTER — Ambulatory Visit (INDEPENDENT_AMBULATORY_CARE_PROVIDER_SITE_OTHER): Payer: Medicare Other | Admitting: *Deleted

## 2010-12-23 DIAGNOSIS — I2699 Other pulmonary embolism without acute cor pulmonale: Secondary | ICD-10-CM

## 2010-12-23 LAB — POCT INR: INR: 1.5

## 2010-12-24 NOTE — Op Note (Signed)
[facility]  Patient:    [name], [name] Visit Number: [id number] MRN: [id number]          Service Type: DSU Location: DAY Attending Physician:  Cain Sieve Proc. Date: 05/31/01 Admit Date:  05/31/2001                             Operative Report  PREOPERATIVE DIAGNOSES: 1. Left knee lateral meniscus tears. 2. Left knee lateral femoral condyle chondral defect.  POSTOPERATIVE DIAGNOSES: 1. Left knee lateral meniscus tears. 2. Left knee lateral femoral condyle chondral defect. 3. Chondromalacia of the patella.  PROCEDURE: 1. Left knee diagnostic arthroscopy. 2. Partial lateral meniscectomy. 3. Chondroplasty of the lateral femoral condyle. 4. Chondroplasty of the patella.  SURGEON:  Vania Rea. Supple, M.D.  ANESTHESIA:  General endotracheal.  TOURNIQUET TIME:  None was used.  ESTIMATED BLOOD LOSS:  Minimal.  HISTORY:  [name] is a 55 year old female, who has had persistent difficulties with left knee pain and mechanical symptoms which have been refractory to all attempts at conservative management.  Preoperative MRI scan does show evidence for displaced lateral meniscus tear as well as a probable chondral defect at the lateral femoral condyle.  Due to her ongoing pain and functional limitations, she is brought to the operating room at this time for the above-outlined procedure.  Preoperatively she was counseled on treatment options as well as risks versus benefits thereof.  Possible complications of bleeding, infection, neurovascular injury, DVT, PE, persistence of pain, and possible need for additional surgery were reviewed.  She understands and accepts and agrees with our planned procedure.  DESCRIPTION OF PROCEDURE:  After undergoing routine preoperative evaluation, knee block was placed in the holding area the anesthesia department.  She was then placed supine on the operating table with left leg placed in leg holder and  sterilely prepped and draped in the standard fashion.  We initially attempted to perform the case under a MAC, but she did not tolerate this, and we proceeded with a general endotracheal anesthesia.  At this point, standard arthroscopy portals were established, and diagnostic arthroscopy was performed.  Suprapatellar pouch as well as gutter showed some diffuse synovitis as well as calcifications within the soft tissue suggestive of chondrocalcinosis.  A limited synovectomy was performed.  A chondral loose body was identified, measuring approximately 1 x 2 x 2 cm, and this was removed with a pituitary rongeur.  The patella showed diffuse grade 3 chondromalacia, and this was debrided back to a stable cartilaginous base with a shaver.  The trochlear groove was in good condition.  The ACL was stable to probing with a negative intraoperative ______ .  Medial compartment showed the articular surfaces to be in good condition, and the medial meniscus was stable to probing with no obvious tears.  Laterally, there was a complex tear involving the anterior half of the meniscus.  A large pedunculated fragment was divided and excised. The remaining rim of the lateral meniscus was trimmed back to a stable peripheral margin with a basket, and a shaver was brought in for final contour and then removal of the meniscal fragments.  Probing of the remaining portion of the lateral meniscus showed it to be stable. Approximately 30% lateral meniscus was excised.  There was noted to be a chondral defect on the lateral femoral condyle which had a transverse component, and then a longitudinal component appeared to be the area where the pedunculated fragment  of meniscal tissue was getting caught in the joint. This area was gently debrided with a shaver to a smooth contour and to a stable cartilaginous base.  At this point, final inspection and irrigation of the joint was then performed.  Fluid and instruments were  then removed.  Then 20 cc of 0.5% Marcaine with epinephrine and 4 mg of morphine was instilled into the joint, and an additional 10 cc of Marcaine with epinephrine was instilled about the portals.  The portals were closed with Steri-Strips.  Then 4 x 4s and then ABDs were wrapped about the knee with an ACE bandage.  The patient was then transferred to the recovery room in stable condition. Attending Physician:  Cain Sieve DD:  05/31/01 TD:  06/01/01 Job: [id number]

## 2010-12-24 NOTE — Op Note (Signed)
NAME:  [name], [name]                        ACCOUNT NO.:  [id number]   MEDICAL RECORD NO.:  [id number]                   PATIENT TYPE:  AMB   LOCATION:  DSC                                  FACILITY:  [facility]   PHYSICIAN:  Harvie Junior, M.D.                DATE OF BIRTH:  09-Jan-1956   DATE OF PROCEDURE:  09/06/2002  DATE OF DISCHARGE:                                 OPERATIVE REPORT   PREOPERATIVE DIAGNOSIS:  Rotator cuff tear with impingement and AC joint  arthritis.   POSTOPERATIVE DIAGNOSIS:  Rotator cuff tear with impingement and AC joint  arthritis.  Anterior labral tear, right shoulder.   PROCEDURE:  1. Open repair of rotator cuff chronic tear with acromioplasty.  2. Partial distal clavicle excision.  3. Debridement of anterior labral tear.   SURGEON:  Harvie Junior, M.D.   ASSISTANT:  Marshia Ly, P.A.   ANESTHESIA:  General with an interscalene block.   INDICATIONS FOR PROCEDURE:  This is a 55 year old female who was initially  evaluated over at [facility] by Scherrie Bateman, M.D., who  correctly diagnosed her with a rotator cuff tear and ultimately got an MRI  which was confirmative.  She was noted to also have on plain x-ray  impingement and AC joint arthritis and because of failure of conservative  care, she was ultimately sent over to [place] for evaluation.  After discussion  of treatment options, she ultimately elected to undergo repair of her  rotator cuff tear with acromioplasty, distal clavicle excision, and other as  needed.  She was brought to the operating room at this point.   DESCRIPTION OF PROCEDURE:  The patient was brought to the operating room and  after adequate anesthesia was obtained with a general anesthetic and an  interscalene block, the patient was placed on the operating table and she  was moved in the beach chair position and all bony prominences were well  padded.  At this point, attention was turned to the right shoulder which  was  prepped and draped in the usual sterile fashion.  Following this, routine  arthroscopic examination of the shoulder revealed that there was an obvious  anterior labral tear with a fray in the anterior portion.  The glenohumeral  joint was without evidence of arthritis.  The rotator cuff was torn  significantly on the undersurface and this was debrided from within the  glenohumeral joint.  Once the labrum and undersurface of the rotator cuff  were debrided within the glenohumeral joint, there was an obvious area where  there appeared to be a full thickness tear, although, it was not large.  There was a fairly significant area of partial thickness tearing surrounding  this torn area.  At this point, attention was turned toward the subacromial  space and the instruments were removed from the glenohumeral joint.  When in  the subacromial space, the acromion  was identified and anterolateral  acromioplasty was performed from both the lateral and posterior portal. The  distal clavicle was identified and 15 mm distal clavicle was excised through  the anterior portal. This was checked with a measured probe.  Following  this, attention was turned to the rotator cuff where there was noted to be a  tear, although, it was a small full thickness tear, we knew from being  within the glenohumeral joint that there was large partial thickness tearing  anterior and posterior to this tear.  At this point, the case was stopped  and the lateral portal was extended.  The deltoid was divided.  The tear was  identified and a 6.5 mm corkscrew anchor was used and with two limbs, the  anterior and posterior limbs were placed anterior and posterior to the tear.  Excellent fixation was achieved of the rotator cuff.  The knots were then  buried with the remnant of the rotator cuff more laterally and distally.  At  this point, the wound was copiously irrigated and suctioned dry. The deltoid  was closed with a 1  Vicryl running suture.  The skin was closed with 0 and 2-  0 Vicryl.  The outer layer of skin was closed with a Monocryl subcuticular  suture.  At this point, a sterile compressive dressing was applied and the  patient was taken to the recovery room where she was noted to be in  satisfactory condition.  Estimated blood loss for the procedure was none.                                               Harvie Junior, M.D.    Ranae Plumber  D:  09/06/2002  T:  09/06/2002  Job:  [id number]   cc:   Scherrie Bateman  [address]  [place]  [place] [address]  Fax: [phone]

## 2011-01-06 ENCOUNTER — Ambulatory Visit (INDEPENDENT_AMBULATORY_CARE_PROVIDER_SITE_OTHER): Payer: Medicare Other | Admitting: *Deleted

## 2011-01-06 DIAGNOSIS — I2699 Other pulmonary embolism without acute cor pulmonale: Secondary | ICD-10-CM

## 2011-01-06 LAB — POCT INR: INR: 2.1

## 2011-01-11 ENCOUNTER — Encounter: Payer: Self-pay | Admitting: Cardiology

## 2011-02-03 ENCOUNTER — Encounter: Payer: Self-pay | Admitting: Cardiology

## 2011-02-03 ENCOUNTER — Ambulatory Visit (INDEPENDENT_AMBULATORY_CARE_PROVIDER_SITE_OTHER): Payer: Medicare Other | Admitting: Cardiology

## 2011-02-03 ENCOUNTER — Ambulatory Visit (INDEPENDENT_AMBULATORY_CARE_PROVIDER_SITE_OTHER): Payer: Medicare Other | Admitting: *Deleted

## 2011-02-03 VITALS — BP 98/70 | HR 58 | Ht 68.0 in | Wt 247.0 lb

## 2011-02-03 DIAGNOSIS — I2699 Other pulmonary embolism without acute cor pulmonale: Secondary | ICD-10-CM

## 2011-02-03 DIAGNOSIS — I2510 Atherosclerotic heart disease of native coronary artery without angina pectoris: Secondary | ICD-10-CM

## 2011-02-03 LAB — POCT INR: INR: 2.2

## 2011-02-03 NOTE — Patient Instructions (Signed)
Your physician wants you to follow-up in: 1 year. You will receive a reminder letter in the mail two months in advance. If you don't receive a letter, please call our office to schedule the follow-up appointment.  

## 2011-02-03 NOTE — Progress Notes (Signed)
HPI Patient is seen for follow up of pulmonary embolus.  This occurred in 2008.  She had significant right ventricular dysfunction at that time.  Echo in March, 2010 showed improvement.  She is doing well.  She continues on warfarin.  She's lost some weight and she is doing well.  I saw her last, November, 2011 Allergies  Allergen Reactions  . Morphine     REACTION: nausea, rash, itching, vomiting  . Venlafaxine     Current Outpatient Prescriptions  Medication Sig Dispense Refill  . aspirin 81 MG tablet Take 81 mg by mouth daily.        . Calcium Carbonate Antacid (ROLAIDS EXTRA STRENGTH) 1177 MG CHEW Chew by mouth as needed.        . calcium-vitamin D (OYSCO D) 250-125 MG-UNIT per tablet Take 1 tablet by mouth daily.        . Cyanocobalamin (B-12) 1500 MCG TBCR Take by mouth daily.        . folic acid (FOLVITE) 1 MG tablet Take 1 mg by mouth daily.        Marland Kitchen HYDROmorphone (DILAUDID) 2 MG tablet Take 2 mg by mouth as needed. For breakthrough pain       . magnesium hydroxide (MILK OF MAGNESIA) 400 MG/5ML suspension Take by mouth as needed.        . methadone (DOLOPHINE) 10 MG tablet Take 10 mg by mouth. Take 2 tabs 2 times daily       . omeprazole (PRILOSEC OTC) 20 MG tablet Take 20 mg by mouth daily.        Marland Kitchen pyridoxine (BL VITAMIN B-6) 100 MG tablet Take 100 mg by mouth daily.        . vitamin C (ASCORBIC ACID) 500 MG tablet Take 500 mg by mouth daily.        Marland Kitchen warfarin (COUMADIN) 5 MG tablet Take by mouth as directed.          History   Social History  . Marital Status: Divorced    Spouse Name: N/A    Number of Children: N/A  . Years of Education: N/A   Occupational History  . Not on file.   Social History Main Topics  . Smoking status: Current Some Day Smoker    Types: Cigarettes  . Smokeless tobacco: Not on file  . Alcohol Use: No  . Drug Use: No  . Sexually Active: Not on file   Other Topics Concern  . Not on file   Social History Narrative   Current smoker wi last  12 mos.     No family history on file.  Past Medical History  Diagnosis Date  . Other pulmonary embolism and infarction     Significant 2008 and coumadin therapy RV dysfunction...echo...2008..EF 50%..right ventricle markedly dilated w marked right ventricular dysfunc and moder tricuspid regurg/echo..March 2010, Ef 50%, mild dilation of right ventricule w mild decrease right ventric function   . Hypopotassemia   . Tobacco use disorder   . Anemia, unspecified   . Hypotension, unspecified   . Chronic low back pain   . Coronary atherosclerosis of unspecified type of vessel, native or graft     minimal catheterization, October 2008  . Chest pain     with stress  . Constipation     and diarrhea chronic..Dr. Barnet Pall  . Right bundle branch block     intermittent  . Leg pain   . Back pain   . Methadone adverse reaction  for chronic leg and back pain  . Homocystinemia     signif elevation in the past...plan folic acid, B6, B12  . Lymphoproliferative disease     disorder in the past??    Past Surgical History  Procedure Date  . Cholecystectomy   . Gastric bypass   . Total hip arthroplasty   . Rotator cuff repair   . Tonsillectomy     ROS  Patient denies fever, chills, headache, sweats, rash, change in vision, change in hearing chest pain, cough, nausea vomiting, urinary symptoms.  All other systems are reviewed and are negative.  PHYSICAL EXAM Patient is stable.  She is oriented to person time and place.  Affect is normal.  Head is atraumatic.  Lungs are clear.  Respiratory effort is unlabored.  Cardiac exam reveals S1 and S2.  No clicks or significant murmurs.  There is no jugular venous distention.  The abdomen is soft there is no peripheral edema. Filed Vitals:   02/03/11 1516  BP: 98/70  Pulse: 58  Height: 5\' 8"  (1.727 m)  Weight: 247 lb (112.038 kg)    EKG Is not.  There is normal sinus rhythm with no significant abnormality.  ASSESSMENT & PLAN

## 2011-02-03 NOTE — Assessment & Plan Note (Signed)
Patient has only minimal coronary disease.  No further workup.

## 2011-02-03 NOTE — Assessment & Plan Note (Signed)
Patient continues to do well.  No change in therapy.

## 2011-02-16 ENCOUNTER — Other Ambulatory Visit: Payer: Self-pay | Admitting: Cardiology

## 2011-03-03 ENCOUNTER — Ambulatory Visit (INDEPENDENT_AMBULATORY_CARE_PROVIDER_SITE_OTHER): Payer: Medicare Other | Admitting: *Deleted

## 2011-03-03 DIAGNOSIS — I2699 Other pulmonary embolism without acute cor pulmonale: Secondary | ICD-10-CM

## 2011-03-03 LAB — POCT INR: INR: 2.8

## 2011-03-30 ENCOUNTER — Ambulatory Visit (INDEPENDENT_AMBULATORY_CARE_PROVIDER_SITE_OTHER): Payer: Medicare Other | Admitting: *Deleted

## 2011-03-30 DIAGNOSIS — I2699 Other pulmonary embolism without acute cor pulmonale: Secondary | ICD-10-CM

## 2011-03-30 LAB — POCT INR: INR: 3.4

## 2011-03-31 ENCOUNTER — Encounter: Payer: Medicare Other | Admitting: *Deleted

## 2011-04-20 ENCOUNTER — Ambulatory Visit (INDEPENDENT_AMBULATORY_CARE_PROVIDER_SITE_OTHER): Payer: Medicare Other | Admitting: *Deleted

## 2011-04-20 DIAGNOSIS — I2699 Other pulmonary embolism without acute cor pulmonale: Secondary | ICD-10-CM

## 2011-04-21 ENCOUNTER — Encounter: Payer: Self-pay | Admitting: Pulmonary Disease

## 2011-04-22 ENCOUNTER — Ambulatory Visit (INDEPENDENT_AMBULATORY_CARE_PROVIDER_SITE_OTHER)
Admission: RE | Admit: 2011-04-22 | Discharge: 2011-04-22 | Disposition: A | Discharge status: Home / Self Care | Payer: Medicare Other | Source: Ambulatory Visit | Attending: Pulmonary Disease | Admitting: Pulmonary Disease

## 2011-04-22 ENCOUNTER — Ambulatory Visit (INDEPENDENT_AMBULATORY_CARE_PROVIDER_SITE_OTHER): Payer: Medicare Other | Admitting: Pulmonary Disease

## 2011-04-22 ENCOUNTER — Encounter: Payer: Self-pay | Admitting: Pulmonary Disease

## 2011-04-22 VITALS — BP 112/72 | HR 71 | Temp 98.4°F | Ht 69.5 in | Wt 253.2 lb

## 2011-04-22 DIAGNOSIS — J449 Chronic obstructive pulmonary disease, unspecified: Secondary | ICD-10-CM

## 2011-04-22 DIAGNOSIS — F17200 Nicotine dependence, unspecified, uncomplicated: Secondary | ICD-10-CM

## 2011-04-22 DIAGNOSIS — R0989 Other specified symptoms and signs involving the circulatory and respiratory systems: Secondary | ICD-10-CM

## 2011-04-22 DIAGNOSIS — R0609 Other forms of dyspnea: Secondary | ICD-10-CM

## 2011-04-22 DIAGNOSIS — R599 Enlarged lymph nodes, unspecified: Secondary | ICD-10-CM

## 2011-04-22 DIAGNOSIS — I2699 Other pulmonary embolism without acute cor pulmonale: Secondary | ICD-10-CM

## 2011-04-22 DIAGNOSIS — R0600 Dyspnea, unspecified: Secondary | ICD-10-CM

## 2011-04-22 DIAGNOSIS — J4489 Other specified chronic obstructive pulmonary disease: Secondary | ICD-10-CM

## 2011-04-22 DIAGNOSIS — Z23 Encounter for immunization: Secondary | ICD-10-CM

## 2011-04-22 NOTE — Progress Notes (Signed)
  Subjective:    Patient ID: [name], female    DOB: 03-Oct-1955, 55 y.o.   MRN: [id number]  HPI  [facility]P - [facility]  29 /F, smoker for with h/o  COPD & PE 1/09 hospitalization for extensive bilateral pulmonary emboli causing right ventricular dysfunction in 10/08, abnormal troponins, without evidence of coronary artery disease on catheterization. Elevated homocysteine level (69), on folate, and and vitamin B6.  Factor V Leyden was negative. Serology showed positive lupus anticoagulant. On coumadin since then, has maintained INR in the 2-3.0 range last few months on review She reports increased dyspnea & chest tightness in stressful situations, when angry or cring. Calming down & taking deep breaths seems to relieve this. She is convinced this is anxiety but since this resembled her episode of PE she comes in for evaluation. Stopped seeing her Haematologist. Her chronic pain is managed by the pain clinic. Increased stress due to custody of grandkids, daughter is living separately. Denies wheezing, sputum production, nocturnal symptoms. Pedal edema is better. Smokes 1-2 cigs daily FEV1 79%     Review of Systems  Constitutional: Positive for fever. Negative for unexpected weight change.  HENT: Positive for ear pain, nosebleeds, sore throat, trouble swallowing and dental problem. Negative for congestion, rhinorrhea, sneezing, postnasal drip and sinus pressure.   Eyes: Negative for redness and itching.  Respiratory: Positive for cough, chest tightness and shortness of breath. Negative for wheezing.   Cardiovascular: Positive for palpitations and leg swelling.  Gastrointestinal: Positive for nausea. Negative for vomiting.  Genitourinary: Positive for dysuria.  Musculoskeletal: Positive for joint swelling.  Skin: Negative for rash.  Neurological: Negative for headaches.  Hematological: Bruises/bleeds easily.  Psychiatric/Behavioral: Positive for dysphoric mood. The  patient is nervous/anxious.        Objective:   Physical Exam  Gen. Pleasant, well-nourished, in no distress, normal affect ENT - no lesions, no post nasal drip, no thrush Neck: No JVD, no thyromegaly, no carotid bruits Lungs: no use of accessory muscles, no dullness to percussion, clear without rales or rhonchi  Cardiovascular: Rhythm regular, heart sounds  normal, no murmurs or gallops, 1+ peripheral edema Abdomen: soft and non-tender, no hepatosplenomegaly, BS normal. Musculoskeletal: No deformities, no cyanosis or clubbing Neuro:  alert, non focal       Assessment & Plan:

## 2011-04-22 NOTE — Patient Instructions (Addendum)
Breathing test appears ok Chest Xray Flu shot Your breathing issues may be related to anxiety You have to STOP smoking

## 2011-04-22 NOTE — Assessment & Plan Note (Signed)
Encouraged to make a quit atempt

## 2011-04-22 NOTE — Assessment & Plan Note (Signed)
Doubt recurrence, note therapeutic INR last few months No significant airway obstruction on spiro Favor anxiety as cause of symptoms - she is Ok with referral to behaviour health

## 2011-04-28 ENCOUNTER — Telehealth: Payer: Self-pay | Admitting: Pulmonary Disease

## 2011-04-28 NOTE — Telephone Encounter (Signed)
Called and spoke with pt. Pt aware of cxr results. Nothing further needed.

## 2011-05-04 LAB — CBC
MCHC: 35.3
MCV: 90.4
Platelets: 153
RBC: 3.74 — ABNORMAL LOW
WBC: 4.7

## 2011-05-04 LAB — COMPREHENSIVE METABOLIC PANEL
ALT: 15
AST: 17
Albumin: 3.1 — ABNORMAL LOW
Calcium: 9
Chloride: 104
Creatinine, Ser: 0.54
GFR calc Af Amer: >60
Sodium: 142

## 2011-05-04 LAB — URINALYSIS, ROUTINE W REFLEX MICROSCOPIC
Bilirubin Urine: NEGATIVE
Glucose, UA: NEGATIVE
Ketones, ur: NEGATIVE
Nitrite: NEGATIVE
Specific Gravity, Urine: 1.016
pH: 6.5

## 2011-05-04 LAB — URINE MICROSCOPIC-ADD ON

## 2011-05-04 LAB — SAMPLE TO BLOOD BANK

## 2011-05-04 LAB — PROTIME-INR: INR: 1.3

## 2011-05-05 LAB — TYPE AND SCREEN

## 2011-05-05 LAB — CBC
HCT: 22 — ABNORMAL LOW
HCT: 27.4 — ABNORMAL LOW
Hemoglobin: 8 — ABNORMAL LOW
Hemoglobin: 9.4 — ABNORMAL LOW
MCHC: 34.7
MCHC: 34.9
MCV: 90
MCV: 90.4
MCV: 91
Platelets: 123 — ABNORMAL LOW
Platelets: 127 — ABNORMAL LOW
RBC: 2.23 — ABNORMAL LOW
RBC: 2.71 — ABNORMAL LOW
RBC: 3.03 — ABNORMAL LOW
RDW: 14.1
RDW: 14.6
RDW: 14.6
WBC: 7

## 2011-05-05 LAB — PROTIME-INR
INR: 1
INR: 1.1
INR: 2.1 — ABNORMAL HIGH
Prothrombin Time: 13.1
Prothrombin Time: 24.6 — ABNORMAL HIGH
Prothrombin Time: 27.9 — ABNORMAL HIGH

## 2011-05-05 LAB — TRANSFUSION REACTION
DAT C3: NEGATIVE
Post RXN DAT IgG: NEGATIVE

## 2011-05-05 LAB — APTT: aPTT: 31

## 2011-05-05 LAB — BASIC METABOLIC PANEL
CO2: 28
Calcium: 8.1 — ABNORMAL LOW
GFR calc Af Amer: >60
GFR calc non Af Amer: >60
Glucose, Bld: 113 — ABNORMAL HIGH
Potassium: 3.5
Sodium: 136

## 2011-05-05 LAB — URINALYSIS, MICROSCOPIC ONLY
Glucose, UA: NEGATIVE
Leukocytes, UA: NEGATIVE
Specific Gravity, Urine: 1.009
pH: 6

## 2011-05-10 ENCOUNTER — Encounter: Payer: Self-pay | Admitting: Internal Medicine

## 2011-05-11 ENCOUNTER — Encounter: Payer: Medicare Other | Admitting: Family Medicine

## 2011-05-11 ENCOUNTER — Ambulatory Visit (INDEPENDENT_AMBULATORY_CARE_PROVIDER_SITE_OTHER): Payer: Medicare Other | Admitting: *Deleted

## 2011-05-11 DIAGNOSIS — I2699 Other pulmonary embolism without acute cor pulmonale: Secondary | ICD-10-CM

## 2011-05-11 NOTE — Progress Notes (Signed)
This encounter was created in error - please disregard.

## 2011-05-12 ENCOUNTER — Ambulatory Visit (INDEPENDENT_AMBULATORY_CARE_PROVIDER_SITE_OTHER): Payer: Medicare Other | Admitting: Gastroenterology

## 2011-05-12 ENCOUNTER — Encounter: Payer: Self-pay | Admitting: Gastroenterology

## 2011-05-12 DIAGNOSIS — K5900 Constipation, unspecified: Secondary | ICD-10-CM

## 2011-05-12 MED ORDER — LACTULOSE 10 GM/15ML PO SOLN: ORAL | Status: DC

## 2011-05-12 NOTE — Patient Instructions (Addendum)
You have been given a separate informational sheet regarding your tobacco use, the importance of quitting and local resources to help you quit. Please follow up with Dr Arlyce Dice in 1 month. We have sent prescriptions to your pharmacy for you to pick up at your convenience. CC: Dr Kirtland Bouchard.Beverely Low

## 2011-05-12 NOTE — Assessment & Plan Note (Signed)
Constipation is undoubtedly related to chronic narcotic use.  Recommendations #1 begin lactulose 1-2 tablespoons twice a day

## 2011-05-12 NOTE — Progress Notes (Signed)
History of Present Illness: [name] is a 55 year old white female referred at the request of Dr. Beverely Low for evaluation of constipation. This has been a problem since she's been taking narcotics for chronic back pain. She may go up to 5 days without a bowel movement. It is often acompanied by abdominal discomfort. When she has a bowel movement it can be forceful  with extreme urgency.  She may have rectal bleeding following a bowel movement. Colonoscopy in 2009 demonstrated a hyperplastic polyp.  The patient has a history of pulmonary emboli for which she is on Coumadin.  She has homocystinemia       Review of Systems: She suffers from severe, chronic back pain. Pertinent positive and negative review of systems were noted in the above HPI section. All other review of systems were otherwise negative.    Current Medications, Allergies, Past Medical History, Past Surgical History, Family History and Social History were reviewed in [facility] electronic medical record  Vital signs were reviewed in today's medical record. Physical Exam: General: Well developed , well nourished, no acute distress Head: Normocephalic and atraumatic Eyes:  sclerae anicteric, EOMI Ears: Normal auditory acuity Mouth: No deformity or lesions Lungs: Clear throughout to auscultation Heart: Regular rate and rhythm; no murmurs, rubs or bruits Abdomen: Soft, non tender and non distended. No masses, hepatosplenomegaly or hernias noted. Normal Bowel sounds Rectal:deferred Musculoskeletal: Symmetrical with no gross deformities  Pulses:  Normal pulses noted Extremities: No clubbing, cyanosis, edema or deformities noted Neurological: Alert oriented x 4, grossly nonfocal Psychological:  Alert and cooperative. Normal mood and affect

## 2011-05-16 ENCOUNTER — Ambulatory Visit (INDEPENDENT_AMBULATORY_CARE_PROVIDER_SITE_OTHER): Payer: Medicare Other | Admitting: Family Medicine

## 2011-05-16 ENCOUNTER — Other Ambulatory Visit: Payer: Self-pay

## 2011-05-16 ENCOUNTER — Encounter: Payer: Self-pay | Admitting: Family Medicine

## 2011-05-16 DIAGNOSIS — I2510 Atherosclerotic heart disease of native coronary artery without angina pectoris: Secondary | ICD-10-CM

## 2011-05-16 DIAGNOSIS — I872 Venous insufficiency (chronic) (peripheral): Secondary | ICD-10-CM

## 2011-05-16 DIAGNOSIS — E039 Hypothyroidism, unspecified: Secondary | ICD-10-CM

## 2011-05-16 DIAGNOSIS — Z0000 Encounter for general adult medical examination without abnormal findings: Secondary | ICD-10-CM

## 2011-05-16 DIAGNOSIS — M545 Low back pain, unspecified: Secondary | ICD-10-CM

## 2011-05-16 DIAGNOSIS — F17200 Nicotine dependence, unspecified, uncomplicated: Secondary | ICD-10-CM

## 2011-05-16 DIAGNOSIS — L0390 Cellulitis, unspecified: Secondary | ICD-10-CM | POA: Insufficient documentation

## 2011-05-16 DIAGNOSIS — L0291 Cutaneous abscess, unspecified: Secondary | ICD-10-CM

## 2011-05-16 DIAGNOSIS — R319 Hematuria, unspecified: Secondary | ICD-10-CM

## 2011-05-16 LAB — BASIC METABOLIC PANEL
BUN: 7 mg/dL (ref 6–23)
CO2: 27
Creatinine, Ser: 0.7 mg/dL (ref 0.4–1.2)
GFR calc non Af Amer: >60
GFR: 91.54 mL/min (ref >60.00)
Glucose, Bld: 118 — ABNORMAL HIGH
Potassium: 3.1 — ABNORMAL LOW
Sodium: 142

## 2011-05-16 LAB — LIPID PANEL
Cholesterol: 141 mg/dL (ref 0–200)
HDL: 34.7 mg/dL — ABNORMAL LOW (ref >39.00)
Triglycerides: 113 mg/dL (ref 0.0–149.0)
VLDL: 22.6 mg/dL (ref 0.0–40.0)

## 2011-05-16 LAB — CBC
HCT: 41.8
Hemoglobin: 14.2
MCHC: 34
RDW: 12.7

## 2011-05-16 LAB — DIFFERENTIAL
Basophils Absolute: 0
Basophils Relative: 1
Eosinophils Relative: 2
Monocytes Absolute: 0.4

## 2011-05-16 LAB — CBC WITH DIFFERENTIAL/PLATELET
Basophils Absolute: 0 10*3/uL (ref 0.0–0.1)
Basophils Relative: 0.4 % (ref 0.0–3.0)
Eosinophils Absolute: 0.2 10*3/uL (ref 0.0–0.7)
MCHC: 33.8 g/dL (ref 30.0–36.0)
MCV: 88.3 fl (ref 78.0–100.0)
Monocytes Absolute: 0.3 10*3/uL (ref 0.1–1.0)
Neutro Abs: 2.5 10*3/uL (ref 1.4–7.7)
Neutrophils Relative %: 60.8 % (ref 43.0–77.0)
RBC: 3.75 Mil/uL — ABNORMAL LOW (ref 3.87–5.11)
RDW: 14.2 % (ref 11.5–14.6)

## 2011-05-16 LAB — POCT URINALYSIS DIPSTICK
Ketones, UA: NEGATIVE
Protein, UA: NEGATIVE
Spec Grav, UA: 1.01
pH, UA: 7.5

## 2011-05-16 LAB — TSH: TSH: 14.01 u[IU]/mL — ABNORMAL HIGH (ref 0.35–5.50)

## 2011-05-16 LAB — HEPATIC FUNCTION PANEL: Total Bilirubin: 0.6 mg/dL (ref 0.3–1.2)

## 2011-05-16 MED ORDER — CEPHALEXIN 500 MG PO CAPS: 500.0000 mg | ORAL_CAPSULE | Freq: Two times a day (BID) | ORAL | Status: AC

## 2011-05-16 NOTE — Patient Instructions (Signed)
Someone will call you with your vascular surgery appt Take the Keflex as directed- twice daily w/ food- for the leg infection We'll notify you of your lab results Call with any questions or concerns Welcome!  We're glad to have you!

## 2011-05-16 NOTE — Progress Notes (Signed)
  Subjective:    Patient ID: [name], female    DOB: 03-31-1956, 56 y.o.   MRN: [id number]  HPI New to establish.  Previous MD- [place].  Sees GYN- Mezzer, GI- Arlyce Dice, Cards- Myrtis Ser, Neurosurg- Curling (does pain management), PulmVassie Loll  Here today for CPE.  Risk Factors: PE- on lifelong coumadin, follows w/ coumadin clinic. Tobacco abuse- chronic problem for pt, currently in cessation program.  Down to 2 cigs daily.   Chronic pain- has multiple degenerating discs, following w/ Neurosurg, on chronic pain meds. Venous insufficiency- bilateral LEs, present since PEs, reports legs are getting darker and hotter, will be tender to touch.  Physical Activity: walking regularly Depression: is currently in counseling due to stress w/ daughter (bipolar), has previously been on Cymbalta.  Reports this was helpful but 'had a lot of side effects. Hearing: normal to conversational tones ADL's: independent Cognitive: normal linear thought process, memory and attention intact Home Safety: lives w/ 11 yr old grandson and 1 yr old granddaughter, feels safe at home Height, Weight, BMI, Visual Acuity: see vitals, vision corrected to 20/20 w/ glasses Counseling: provided on healthy diet, regular exercise, health maintenance, and stress relief Labs Ordered: See A&P Care Plan: See A&P    Review of Systems Patient reports no vision/ hearing changes, adenopathy,fever, weight change,  persistant/recurrent hoarseness , swallowing issues, chest pain, palpitations, persistant/recurrent cough, hemoptysis, dyspnea (rest/exertional/paroxysmal nocturnal), gastrointestinal bleeding (melena, rectal bleeding), abdominal pain, significant heartburn, bowel changes, GU symptoms (dysuria, hematuria, incontinence), Gyn symptoms (abnormal  bleeding, pain),  syncope, focal weakness, memory loss, numbness & tingling, skin/hair/nail changes, abnormal bruising or bleeding.   +edema    Objective:   Physical Exam  General  Appearance:    Alert, cooperative, no distress, appears stated age  Head:    Normocephalic, without obvious abnormality, atraumatic  Eyes:    PERRL, conjunctiva/corneas clear, EOM's intact, fundi    benign, both eyes  Ears:    Normal TM's and external ear canals, both ears  Nose:   Nares normal, septum midline, mucosa normal, no drainage    or sinus tenderness  Throat:   Lips, mucosa, and tongue normal; teeth and gums normal  Neck:   Supple, symmetrical, trachea midline, no adenopathy;    Thyroid: no enlargement/tenderness/nodules  Back:     Symmetric, no curvature, ROM normal, no CVA tenderness  Lungs:     Clear to auscultation bilaterally, respirations unlabored  Chest Wall:    No tenderness or deformity   Heart:    Regular rate and rhythm, S1 and S2 normal, no murmur, rub   or gallop  Breast Exam:    Deferred to GYN  Abdomen:     Soft, non-tender, bowel sounds active all four quadrants,    no masses, no organomegaly  Genitalia:    Deferred to GYN  Rectal:    Extremities:   Bilateral LE pitting edema w/ erythema and warmth 2/3 of shin  Pulses:   2+ and symmetric all extremities  Skin:   Skin color, texture, turgor normal, no rashes or lesions  Lymph nodes:   Cervical, supraclavicular, and axillary nodes normal  Neurologic:   CNII-XII intact, normal strength, sensation and reflexes    throughout          Assessment & Plan:

## 2011-05-17 ENCOUNTER — Telehealth: Payer: Self-pay

## 2011-05-17 DIAGNOSIS — R945 Abnormal results of liver function studies: Secondary | ICD-10-CM

## 2011-05-17 DIAGNOSIS — R7989 Other specified abnormal findings of blood chemistry: Secondary | ICD-10-CM

## 2011-05-17 NOTE — Progress Notes (Signed)
Quick Note:  Left message on personally identified voicemail to notify pt and labs mailed. Advised pt call with questions or concerns ______

## 2011-05-17 NOTE — Telephone Encounter (Signed)
Message copied by Beverely Low on Tue May 17, 2011  8:57 AM ------      Message from: Sheliah Hatch      Created: Mon May 16, 2011  2:40 PM       WBC shows low hgb and WBC but this is stable from previous.  Will need to recheck in 6 months            AST is twice what it should be- needs to hold alcohol and tylenol and repeat in 2-4 weeks (dx- abnormal LFTs)            TSH is abnormal- please add FREE T3/T4 (dx hypothyroid)

## 2011-05-17 NOTE — Telephone Encounter (Signed)
Pt returned call to schedule lab visit.  Appointment scheduled

## 2011-05-17 NOTE — Telephone Encounter (Signed)
Left message on personally identified voicemail to notify pt of results. Labs also mailed and order placed for repeat LFT

## 2011-05-18 LAB — BASIC METABOLIC PANEL
BUN: 2 — ABNORMAL LOW
BUN: 2 — ABNORMAL LOW
BUN: 3 — ABNORMAL LOW
BUN: 3 — ABNORMAL LOW
CO2: 26
CO2: 27
CO2: 29
Calcium: 7.5 — ABNORMAL LOW
Calcium: 7.6 — ABNORMAL LOW
Calcium: 7.8 — ABNORMAL LOW
Chloride: 105
Chloride: 106
Creatinine, Ser: 0.44
Creatinine, Ser: 0.5
Creatinine, Ser: 0.54
Creatinine, Ser: 0.59
Creatinine, Ser: 0.59
GFR calc Af Amer: >60
GFR calc Af Amer: >60
GFR calc non Af Amer: >60
GFR calc non Af Amer: >60
GFR calc non Af Amer: >60
GFR calc non Af Amer: >60
Glucose, Bld: 101 — ABNORMAL HIGH
Glucose, Bld: 73
Glucose, Bld: 73
Glucose, Bld: 86
Glucose, Bld: 92
Potassium: 3.5
Potassium: 4
Potassium: 4
Sodium: 138
Sodium: 141

## 2011-05-18 LAB — PROTIME-INR
INR: 1.5
INR: 2.6 — ABNORMAL HIGH
INR: 2.8 — ABNORMAL HIGH
INR: 3.1 — ABNORMAL HIGH
Prothrombin Time: 18.5 — ABNORMAL HIGH
Prothrombin Time: 28.7 — ABNORMAL HIGH
Prothrombin Time: 30.9 — ABNORMAL HIGH

## 2011-05-18 LAB — T4, FREE: Free T4: 0.69 ng/dL (ref 0.60–1.60)

## 2011-05-18 LAB — CBC
HCT: 30.1 — ABNORMAL LOW
HCT: 31.8 — ABNORMAL LOW
HCT: 32.4 — ABNORMAL LOW
Hemoglobin: 10 — ABNORMAL LOW
Hemoglobin: 10.2 — ABNORMAL LOW
Hemoglobin: 10.7 — ABNORMAL LOW
Hemoglobin: 9.8 — ABNORMAL LOW
MCHC: 33.3
MCHC: 33.6
MCHC: 34
MCV: 93.7
Platelets: 112 — ABNORMAL LOW
Platelets: 121 — ABNORMAL LOW
Platelets: 139 — ABNORMAL LOW
Platelets: 154
Platelets: 205
RBC: 3.27 — ABNORMAL LOW
RBC: 3.37 — ABNORMAL LOW
RDW: 13.8
RDW: 14.1 — ABNORMAL HIGH
RDW: 14.1 — ABNORMAL HIGH
RDW: 14.1 — ABNORMAL HIGH
RDW: 14.2 — ABNORMAL HIGH
WBC: 4.9
WBC: 5.3

## 2011-05-18 LAB — FACTOR 5 LEIDEN

## 2011-05-18 LAB — IRON AND TIBC
Iron: 11 — ABNORMAL LOW
Saturation Ratios: 8 — ABNORMAL LOW
TIBC: 145 — ABNORMAL LOW
UIBC: 134

## 2011-05-18 LAB — URINE CULTURE: Colony Count: NO GROWTH

## 2011-05-18 LAB — CLOSTRIDIUM DIFFICILE EIA: C difficile Toxins A+B, EIA: NEGATIVE

## 2011-05-18 LAB — LIPID PANEL
Cholesterol: 79
HDL: 37 — ABNORMAL LOW

## 2011-05-18 LAB — FOLATE RBC: RBC Folate: 562

## 2011-05-18 LAB — FERRITIN: Ferritin: 124 (ref 10–291)

## 2011-05-18 LAB — HEPARIN LEVEL (UNFRACTIONATED)
Heparin Unfractionated: 0.33
Heparin Unfractionated: 0.34
Heparin Unfractionated: 0.34
Heparin Unfractionated: <0.1 — ABNORMAL LOW

## 2011-05-18 LAB — LUPUS ANTICOAGULANT PANEL
PTTLA Confirmation: 25.5 — ABNORMAL HIGH (ref <8.0)
dRVVT Incubated 1:1 Mix: 42.2 (ref 36.1–47.0)

## 2011-05-18 LAB — T3, FREE: T3, Free: 3.1 pg/mL (ref 2.3–4.2)

## 2011-05-18 LAB — VITAMIN B12: Vitamin B-12: 618 (ref 211–911)

## 2011-05-18 LAB — OCCULT BLOOD X 1 CARD TO LAB, STOOL: Fecal Occult Bld: POSITIVE

## 2011-05-18 LAB — CANCER ANTIGEN 19-9: CA 19-9: 10.1 — ABNORMAL LOW (ref <35.0)

## 2011-05-18 LAB — PHOSPHORUS: Phosphorus: 1.5 — ABNORMAL LOW

## 2011-05-18 NOTE — Progress Notes (Signed)
Quick Note:  Pt aware ______ 

## 2011-05-19 LAB — CBC
HCT: 34.6 — ABNORMAL LOW
Hemoglobin: 12.3
Hemoglobin: 13.3
MCHC: 33.5
Platelets: 165
RBC: 3.94
RBC: 4.24
WBC: 8.4

## 2011-05-19 LAB — PROTIME-INR: INR: 1.2

## 2011-05-19 LAB — STOOL CULTURE

## 2011-05-19 LAB — CARDIAC PANEL(CRET KIN+CKTOT+MB+TROPI)
CK, MB: 12.8 — ABNORMAL HIGH
CK, MB: 15.6 — ABNORMAL HIGH
Relative Index: 7.2 — ABNORMAL HIGH
Relative Index: 7.8 — ABNORMAL HIGH
Total CK: 134
Troponin I: 2.08

## 2011-05-19 LAB — POCT I-STAT 3, ART BLOOD GAS (G3+)
Bicarbonate: 20.9
O2 Saturation: 90
TCO2: 22
pCO2 arterial: 31.7 — ABNORMAL LOW
pH, Arterial: 7.429 — ABNORMAL HIGH

## 2011-05-19 LAB — COMPREHENSIVE METABOLIC PANEL
ALT: 42 — ABNORMAL HIGH
ALT: 55 — ABNORMAL HIGH
AST: 54 — ABNORMAL HIGH
Albumin: 2.3 — ABNORMAL LOW
Alkaline Phosphatase: 62
Alkaline Phosphatase: 75
CO2: 25
Chloride: 102
Chloride: 106
GFR calc Af Amer: >60
GFR calc non Af Amer: >60
Glucose, Bld: 114 — ABNORMAL HIGH
Potassium: 3 — ABNORMAL LOW
Potassium: 3.2 — ABNORMAL LOW
Sodium: 135
Sodium: 136
Total Bilirubin: 0.7
Total Bilirubin: 0.7
Total Protein: 5.1 — ABNORMAL LOW

## 2011-05-19 LAB — DIFFERENTIAL
Basophils Absolute: 0.1
Basophils Relative: 0
Basophils Relative: 1
Eosinophils Absolute: 0
Eosinophils Relative: 0
Monocytes Absolute: 0.6
Monocytes Relative: 7
Neutrophils Relative %: 77

## 2011-05-19 LAB — FECAL LACTOFERRIN, QUANT: Fecal Lactoferrin: POSITIVE

## 2011-05-19 LAB — POCT I-STAT 3, VENOUS BLOOD GAS (G3P V)
TCO2: 26
pCO2, Ven: 39.5 — ABNORMAL LOW
pH, Ven: 7.401 — ABNORMAL HIGH
pO2, Ven: 26 — CL

## 2011-05-19 NOTE — Progress Notes (Signed)
Quick Note:    Labs mailed.  ______

## 2011-05-20 ENCOUNTER — Encounter: Payer: Self-pay | Admitting: Vascular Surgery

## 2011-05-23 ENCOUNTER — Telehealth: Payer: Self-pay | Admitting: Gastroenterology

## 2011-05-23 NOTE — Telephone Encounter (Signed)
Pt states that she has been taking it this way and still has had little to no results. Pt wants to know if she should add some milk of mag with the lactulose. Please advise.

## 2011-05-23 NOTE — Telephone Encounter (Signed)
She should be taking 2 tablespoons twice a day.  If not this should be started (may need more meds)

## 2011-05-23 NOTE — Telephone Encounter (Signed)
Pt states that the lactulose she has been taking has only made her have one small bowel movement. She states the bottle is almost out and she doesn't think that it has helped much. She has been taking it twice a day. Pt states she was using miralax but got to the point she was using so much she needed to try something else. Dr. Arlyce Dice please advise.

## 2011-05-24 NOTE — Assessment & Plan Note (Signed)
Check labs and adjust meds prn. 

## 2011-05-24 NOTE — Assessment & Plan Note (Signed)
Pt's PE WNL w/ exception of LE cellulitis, obesity.  UTD on health maintenance.  Check labs.  Anticipatory guidance provided.

## 2011-05-24 NOTE — Telephone Encounter (Signed)
Pt aware.

## 2011-05-24 NOTE — Assessment & Plan Note (Signed)
Given the severity of sxs will refer to vascular.  Pt expressed understanding and is in agreement w/ plan.

## 2011-05-24 NOTE — Telephone Encounter (Signed)
Ok with MOM

## 2011-05-24 NOTE — Assessment & Plan Note (Signed)
Following w/ pain management and neurosurg.  Will defer management to them.  Will follow along and assist as able.

## 2011-05-24 NOTE — Assessment & Plan Note (Signed)
+  erythema and warmth to LEs consistent w/ cellulitis.  Start Keflex.  Reviewed supportive care and red flags that should prompt return.  Pt expressed understanding and is in agreement w/ plan.

## 2011-05-24 NOTE — Assessment & Plan Note (Signed)
Pt is currently in cessation program.  Applauded her efforts.  Will continue to follow.

## 2011-05-31 ENCOUNTER — Other Ambulatory Visit (INDEPENDENT_AMBULATORY_CARE_PROVIDER_SITE_OTHER): Payer: Medicare Other

## 2011-05-31 ENCOUNTER — Ambulatory Visit (INDEPENDENT_AMBULATORY_CARE_PROVIDER_SITE_OTHER): Payer: Medicare Other | Admitting: Family Medicine

## 2011-05-31 ENCOUNTER — Encounter: Payer: Self-pay | Admitting: *Deleted

## 2011-05-31 ENCOUNTER — Encounter: Payer: Self-pay | Admitting: Family Medicine

## 2011-05-31 DIAGNOSIS — G5790 Unspecified mononeuropathy of unspecified lower limb: Secondary | ICD-10-CM

## 2011-05-31 DIAGNOSIS — H43399 Other vitreous opacities, unspecified eye: Secondary | ICD-10-CM

## 2011-05-31 DIAGNOSIS — M792 Neuralgia and neuritis, unspecified: Secondary | ICD-10-CM

## 2011-05-31 DIAGNOSIS — R7989 Other specified abnormal findings of blood chemistry: Secondary | ICD-10-CM

## 2011-05-31 DIAGNOSIS — R945 Abnormal results of liver function studies: Secondary | ICD-10-CM

## 2011-05-31 DIAGNOSIS — I2699 Other pulmonary embolism without acute cor pulmonale: Secondary | ICD-10-CM

## 2011-05-31 LAB — HEPATIC FUNCTION PANEL
ALT: 27 U/L (ref 0–35)
Albumin: 3.6 g/dL (ref 3.5–5.2)
Alkaline Phosphatase: 82 U/L (ref 39–117)
Total Protein: 7.1 g/dL (ref 6.0–8.3)

## 2011-05-31 MED ORDER — GABAPENTIN 300 MG PO CAPS: 300.0000 mg | ORAL_CAPSULE | Freq: Three times a day (TID) | ORAL | Status: DC

## 2011-05-31 NOTE — Progress Notes (Signed)
Labs only

## 2011-05-31 NOTE — Progress Notes (Signed)
  Subjective:    Patient ID: [name], female    DOB: 1955/12/27, 55 y.o.   MRN: [id number]  HPI Floaters- sxs started 1 week ago, 'they look like black strings'  sxs in R eye.  Almost constant.  Denies any recent injuries to face and eye.  Is on coumadin.  Last eye exam 1999.  Chronic pain- started Prednisone last week for chronic leg pain.  Reports legs will feel 'heavy and burn and then go numb'  Was previously on Neurontin.  Can't remember if this worked.  Review of Systems For ROS see HPI     Objective:   Physical Exam  Vitals reviewed. Constitutional: She appears well-developed and well-nourished. No distress.  HENT:  Head: Normocephalic and atraumatic.  Eyes: Conjunctivae and EOM are normal. Pupils are equal, round, and reactive to light.       Fundi are poorly visualized bilaterally          Assessment & Plan:

## 2011-05-31 NOTE — Patient Instructions (Signed)
Please go to the eye doctor to evaluate your floaters Start the Neurontin 3x/day for the leg pain Call with any questions or concerns Hang in there!

## 2011-06-01 ENCOUNTER — Telehealth: Payer: Self-pay | Admitting: Cardiovascular Disease

## 2011-06-01 ENCOUNTER — Telehealth: Payer: Self-pay | Admitting: Pharmacist

## 2011-06-01 NOTE — Assessment & Plan Note (Signed)
Pt's already on multiple narcotics.  Burning pain suggestive of neuropathic pain- may respond better to neurontin.  Start 300mg  tid and titrate prn.  Pt expressed understanding and is in agreement w/ plan.

## 2011-06-01 NOTE — Telephone Encounter (Signed)
Pt called and left message on [facility] voicemail.  Her PCP thought she might be having a retina bleed secondary to Coumadin.  She has gone to the eye doctor who has referred her to a retina specialist.  She will see the specialist in 2 weeks.  We will recheck her INR next week.

## 2011-06-01 NOTE — Assessment & Plan Note (Signed)
Pt has not had eye exam in 13 years.  No traumatic injury to face or eye but concerning b/c pt is on coumadin.  Needs ophtho appt ASAP to r/o retinal detachment or bleed.  Pt expressed understanding and is in agreement w/ plan.

## 2011-06-07 ENCOUNTER — Ambulatory Visit (INDEPENDENT_AMBULATORY_CARE_PROVIDER_SITE_OTHER): Payer: Medicare Other | Admitting: *Deleted

## 2011-06-07 DIAGNOSIS — I2699 Other pulmonary embolism without acute cor pulmonale: Secondary | ICD-10-CM

## 2011-06-13 ENCOUNTER — Ambulatory Visit: Payer: Medicare Other | Admitting: Gastroenterology

## 2011-06-14 ENCOUNTER — Encounter: Payer: Self-pay | Admitting: Family Medicine

## 2011-06-14 ENCOUNTER — Ambulatory Visit (INDEPENDENT_AMBULATORY_CARE_PROVIDER_SITE_OTHER): Payer: Medicare Other | Admitting: Family Medicine

## 2011-06-14 DIAGNOSIS — L0291 Cutaneous abscess, unspecified: Secondary | ICD-10-CM

## 2011-06-14 DIAGNOSIS — K224 Dyskinesia of esophagus: Secondary | ICD-10-CM

## 2011-06-14 DIAGNOSIS — L0390 Cellulitis, unspecified: Secondary | ICD-10-CM

## 2011-06-14 DIAGNOSIS — J329 Chronic sinusitis, unspecified: Secondary | ICD-10-CM

## 2011-06-14 DIAGNOSIS — R609 Edema, unspecified: Secondary | ICD-10-CM

## 2011-06-14 MED ORDER — RABEPRAZOLE SODIUM 20 MG PO TBEC: 20.0000 mg | DELAYED_RELEASE_TABLET | Freq: Every day | ORAL | Status: DC

## 2011-06-14 MED ORDER — AMOXICILLIN-POT CLAVULANATE 875-125 MG PO TABS: 1.0000 | ORAL_TABLET | Freq: Two times a day (BID) | ORAL | Status: AC

## 2011-06-14 MED ORDER — FUROSEMIDE 40 MG PO TABS: 40.0000 mg | ORAL_TABLET | Freq: Every day | ORAL | Status: DC

## 2011-06-14 NOTE — Progress Notes (Signed)
  Subjective:    Patient ID: [name], female    DOB: 1956/06/03, 55 y.o.   MRN: [id number]  HPI Leg pain- bilateral.  Increased swelling to point of blistering and open sores.  Legs are actively oozing.  sxs x1 week.  Wears TED hose.  Not on Lasix.  Stomach 'spasm'- they 'came back pretty strong'.  Only improvement is w/ food or taking Rolaids.  Denies heartburn.  sxs x1 month intermittently.  GI- Dr Arlyce Dice.  Spasm is located in center of chest, 'right between my breasts'.  Not on PPI but has done Aciphex before.  Had stomach 'partitioning done way back when' and it hurts 'right where i think that little stomach is'.  Does not feel similar to MI, reports Dr Myrtis Ser has cleared her heart from cardiac perspective.  Chest congestion- sxs started 4-5 days ago, + sick contacts.  Low grade temps.  + cough, wet, productive.  + facial pressure.   Review of Systems For ROS see HPI     Objective:   Physical Exam  Vitals reviewed. Constitutional: She appears well-developed and well-nourished. No distress.  HENT:  Head: Normocephalic and atraumatic.  Right Ear: Tympanic membrane normal.  Left Ear: Tympanic membrane normal.  Nose: Mucosal edema and rhinorrhea present. Right sinus exhibits maxillary sinus tenderness and frontal sinus tenderness. Left sinus exhibits maxillary sinus tenderness and frontal sinus tenderness.  Mouth/Throat: Uvula is midline and mucous membranes are normal. Posterior oropharyngeal erythema present. No oropharyngeal exudate.  Eyes: Conjunctivae and EOM are normal. Pupils are equal, round, and reactive to light.  Neck: Normal range of motion. Neck supple.  Cardiovascular: Normal rate, regular rhythm and normal heart sounds.   Pulmonary/Chest: Effort normal and breath sounds normal. No respiratory distress. She has no wheezes.  Abdominal: Soft. Bowel sounds are normal. She exhibits no distension. There is no tenderness. There is no rebound and no guarding.    Musculoskeletal: She exhibits edema (bilateral LE edema w/ blisters, weeping bulla w/ open wounds.  R>L).  Lymphadenopathy:    She has no cervical adenopathy.          Assessment & Plan:

## 2011-06-14 NOTE — Assessment & Plan Note (Signed)
Pt refused EKG today, reports it is 'not my heart'.  Will start PPI in case of silent reflux causing esophageal spasm.  Reviewed supportive care and red flags that should prompt return.  Pt expressed understanding and is in agreement w/ plan.

## 2011-06-14 NOTE — Assessment & Plan Note (Signed)
Deteriorated.  Legs now weeping.  Add Lasix to decrease pressure on already distended tissues.  Pt expressed understanding and is in agreement w/ plan.

## 2011-06-14 NOTE — Assessment & Plan Note (Signed)
Given presence of weeping, open wounds on legs will start Augmentin to cover both sinus infxn and cellulitis.  Given amount of tissue thinning and # of wounds will refer to wound center for tx.  Pt expressed understanding and is in agreement w/ plan.

## 2011-06-14 NOTE — Patient Instructions (Signed)
Start the Augmentin for the sinus and leg infections (take w/ food to avoid upset stomach- may cause diarrhea) Start the Aciphex daily Take the Lasix in the morning to help your leg swelling (take your first pill now) Someone will call you with your wound appt If your chest pain worsens, you have associated shortness of breath, or other concerns- go to the ER Hang in there!

## 2011-06-14 NOTE — Assessment & Plan Note (Signed)
New.  Start abx.  Reviewed supportive care and red flags that should prompt return.  Pt expressed understanding and is in agreement w/ plan.  

## 2011-06-23 ENCOUNTER — Encounter (HOSPITAL_BASED_OUTPATIENT_CLINIC_OR_DEPARTMENT_OTHER): Payer: Medicare Other

## 2011-07-05 ENCOUNTER — Encounter: Payer: Medicare Other | Admitting: *Deleted

## 2011-07-06 ENCOUNTER — Encounter: Payer: Self-pay | Admitting: Vascular Surgery

## 2011-07-07 ENCOUNTER — Other Ambulatory Visit (INDEPENDENT_AMBULATORY_CARE_PROVIDER_SITE_OTHER): Payer: Medicare Other | Admitting: *Deleted

## 2011-07-07 ENCOUNTER — Encounter: Payer: Self-pay | Admitting: Vascular Surgery

## 2011-07-07 ENCOUNTER — Ambulatory Visit (INDEPENDENT_AMBULATORY_CARE_PROVIDER_SITE_OTHER): Payer: Medicare Other | Admitting: Vascular Surgery

## 2011-07-07 VITALS — BP 121/73 | HR 58 | Temp 98.1°F | Ht 69.0 in | Wt 240.0 lb

## 2011-07-07 DIAGNOSIS — I83893 Varicose veins of bilateral lower extremities with other complications: Secondary | ICD-10-CM

## 2011-07-07 DIAGNOSIS — L97909 Non-pressure chronic ulcer of unspecified part of unspecified lower leg with unspecified severity: Secondary | ICD-10-CM

## 2011-07-07 DIAGNOSIS — I872 Venous insufficiency (chronic) (peripheral): Secondary | ICD-10-CM

## 2011-07-07 NOTE — Progress Notes (Signed)
[facility] [place]   Reason for referral: Swollen legs with ulceration  History of Present Illness  [name] is a 55 y.o. female who presents with chief complaint: swollen leg with ulceration.  His lower extremities. She has also had multiple prior exacerbations and remissions of ulcerations since her prior DVT in 2008. She stated that her most recent episode started around Thanksgiving after she got very swollen legs. The patient has had a history of DVT and pulmonary embolus.  She is on chronic coumadin.  There is a family history of varicose veins.  The patient has used compression stockings in the past but has not worn them for several months.  Past Medical History  Diagnosis Date  . Other pulmonary embolism and infarction     Significant 2008 and coumadin therapy RV dysfunction...echo...2008..EF 50%..right ventricle markedly dilated w marked right ventricular dysfunc and moder tricuspid regurg/echo..March 2010, Ef 50%, mild dilation of right ventricule w mild decrease right ventric function   . Hypopotassemia   . Tobacco use disorder   . Anemia, unspecified   . Hypotension, unspecified   . Chronic low back pain   . Coronary atherosclerosis of unspecified type of vessel, native or graft     minimal catheterization, October 2008  . Chest pain     with stress  . Constipation     and diarrhea chronic..Dr. Barnet Pall  . Right bundle branch block     intermittent  . Leg pain   . Back pain   . Methadone adverse reaction     for chronic leg and back pain  . Homocystinemia     signif elevation in the past...plan folic acid, B6, B12  . Lymphoproliferative disease     disorder in the past??  . Cellulitis   . Thyroid disease   . Peripheral vascular disease   . CAD (coronary artery disease)     Past Surgical History  Procedure Date  . Cholecystectomy   . Gastric bypass   . Tonsillectomy   . Joint replacement     right total hip replacement  .  Rotator cuff repair     right shoulder    History   Social History  . Marital Status: Divorced    Spouse Name: N/A    Number of Children: 1  . Years of Education: N/A   Occupational History  . Disabled    Social History Main Topics  . Smoking status: Former Smoker    Types: Cigarettes    Quit date: 06/09/2011  . Smokeless tobacco: Never Used   Comment: 2 ciggs a day  . Alcohol Use: No  . Drug Use: No  . Sexually Active: Not on file   Other Topics Concern  . Not on file   Social History Narrative   Current smoker wi last 12 mos.     Family History  Problem Relation Age of Onset  . Crohn's disease Sister   . Heart disease Mother   . Heart disease Father     Current Outpatient Prescriptions on File Prior to Visit  Medication Sig Dispense Refill  . aspirin 81 MG tablet Take 81 mg by mouth daily.        . Calcium Carbonate Antacid (ROLAIDS EXTRA STRENGTH) 1177 MG CHEW Chew by mouth as needed.        . calcium-vitamin D (OYSCO D) 250-125 MG-UNIT per tablet Take 1 tablet by mouth daily.        . Cyanocobalamin (  B-12) 1500 MCG TBCR Take by mouth daily.        . folic acid (FOLVITE) 1 MG tablet Take 1 mg by mouth daily.        . furosemide (LASIX) 40 MG tablet Take 1 tablet (40 mg total) by mouth daily.  30 tablet  11  . gabapentin (NEURONTIN) 300 MG capsule Take 1 capsule (300 mg total) by mouth 3 (three) times daily.  90 capsule  2  . HYDROmorphone (DILAUDID) 2 MG tablet Take 4 mg by mouth as needed. For breakthrough pain      . lactulose (CHRONULAC) 10 GM/15ML solution Tape 1-2 tablespoons twice a day  240 mL  1  . magnesium hydroxide (MILK OF MAGNESIA) 400 MG/5ML suspension Take by mouth as needed.        . methadone (DOLOPHINE) 10 MG tablet Take 10 mg by mouth. Take 2 tabs 2 times daily       . pyridoxine (BL VITAMIN B-6) 100 MG tablet Take 100 mg by mouth daily.        . RABEprazole (ACIPHEX) 20 MG tablet Take 1 tablet (20 mg total) by mouth daily.  30 tablet  1  .  vitamin C (ASCORBIC ACID) 500 MG tablet Take 500 mg by mouth daily.        Marland Kitchen warfarin (COUMADIN) 5 MG tablet Take as directed by Anticoagulation clinic   60 tablet  3  . cephALEXin (KEFLEX) 500 MG capsule       . methylPREDNIsolone (MEDROL DOSPACK) 4 MG tablet         Allergies as of 07/07/2011 - Review Complete 07/07/2011  Allergen Reaction Noted  . Morphine    . Nsaids  04/22/2011  . Venlafaxine       ROS:   General:  + weight loss  HEENT: No recent headaches, no nasal bleeding, no visual changes, no sore throat  Neurologic: No dizziness, blackouts, seizures. No recent symptoms of stroke or mini- stroke. No recent episodes of slurred speech, or temporary blindness.  Cardiac: No recent episodes of chest pain/pressure, no shortness of breath at rest.  No shortness of breath with exertion.  Denies history of atrial fibrillation or irregular heartbeat  Vascular: as above  Pulmonary: No home oxygen, no productive cough, no hemoptysis,  No asthma or wheezing  Musculoskeletal:  [x ] Arthritis, [x ] Low back pain,  [x ] Joint pain  Hematologic:No history of hypercoagulable state.  No history of easy bleeding.  No history of anemia  Gastrointestinal: No hematochezia or melena,  Does have gastroesophageal reflux, no trouble swallowing  Urinary:denies  [ ]  chronic Kidney disease, [ ]  on HD - [ ]  MWF or [ ]  TTHS, [ ]  Burning with urination, [x ] Frequent urination, [ ]  Difficulty urinating;   Skin: No rashes  Psychological: No history of anxiety,  No history of depression  Physical Examination  Filed Vitals:   07/07/11 1408  BP: 121/73  Pulse: 58  Temp: 98.1 F (36.7 C)  TempSrc: Oral  Height: 5\' 9"  (1.753 m)  Weight: 240 lb (108.863 kg)  SpO2: 99%    Body mass index is 35.44 kg/(m^2).  General:  Alert and oriented, no acute distress HEENT: Normal Neck: No bruit or JVD Pulmonary: Clear to auscultation bilaterally Cardiac: Regular Rate and Rhythm without  murmur Abdomen: Soft, non-tender, non-distended, no mass, no scars, obese Skin: diffuse ulcers circumferential both calves Extremity Pulses:  2+ radial, brachial, femoral, dorsalis pedis, posterior tibial pulses bilaterally  Musculoskeletal: No deformity  Trace edema  Neurologic: Upper and lower extremity motor 5/5 and symmetric  DATA: She had a venous duplex exam today which showed reflux of the deep and superficial venous system, no evidence of DVT. I reviewed and interpreted this study.  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 I discussed with the patient that long-term her best therapy is going to be compression. I also informed her she is probably going to have multiple exacerbations and remissions over time compliance with compression would be paramount in preventing his exacerbations. I do not believe she is a very good candidate for laser ablation due to the significance of her deep venous reflux and did not believe we would significantly help her by ablating just her superficial system.  Fabienne Bruns, MD [facility] [place] Office: [phone] Pager: [phone]

## 2011-07-11 ENCOUNTER — Ambulatory Visit (INDEPENDENT_AMBULATORY_CARE_PROVIDER_SITE_OTHER): Payer: Medicare Other | Admitting: *Deleted

## 2011-07-11 DIAGNOSIS — I2699 Other pulmonary embolism without acute cor pulmonale: Secondary | ICD-10-CM

## 2011-07-12 ENCOUNTER — Ambulatory Visit (HOSPITAL_BASED_OUTPATIENT_CLINIC_OR_DEPARTMENT_OTHER): Payer: Medicare Other

## 2011-07-14 ENCOUNTER — Encounter (HOSPITAL_BASED_OUTPATIENT_CLINIC_OR_DEPARTMENT_OTHER): Payer: Medicare Other

## 2011-07-14 ENCOUNTER — Ambulatory Visit (INDEPENDENT_AMBULATORY_CARE_PROVIDER_SITE_OTHER): Payer: Medicare Other | Admitting: *Deleted

## 2011-07-14 VITALS — HR 57 | Resp 16 | Ht 68.0 in | Wt 253.0 lb

## 2011-07-14 DIAGNOSIS — I739 Peripheral vascular disease, unspecified: Secondary | ICD-10-CM

## 2011-07-14 DIAGNOSIS — L98499 Non-pressure chronic ulcer of skin of other sites with unspecified severity: Secondary | ICD-10-CM

## 2011-07-14 NOTE — Progress Notes (Signed)
Bilateral unna boot placement by Melba Eldridge-Lewis, RMA.    Removed bilateral unna boots today.  Patients left leg healing well. Right leg has 2-3 ulcers healing slow, possible two applications needed. One Unna boot per leg was applied today.  Patient should return in one week for dressing change. Patient voiced understanding.

## 2011-07-15 NOTE — Procedures (Unsigned)
LOWER EXTREMITY VENOUS REFLUX EXAM  INDICATION:  Bilateral lower extremity ulceration with varicose veins.  EXAM:  Using color-flow imaging and pulse Doppler spectral analysis, the bilateral common femoral, superficial femoral, popliteal, posterior tibial, greater and lesser saphenous veins are evaluated.  There is evidence suggesting deep venous insufficiency in the bilateral lower extremities.  The bilateral saphenofemoral junctions are not competent with Reflux of >576milliseconds. The bilateral GSV's are not competent with Reflux of >526milliseconds with the caliber as described below.  GSV Diameter (used if found to be incompetent only)                                           Right    Left Proximal Greater Saphenous Vein           1.2 cm   1.6 cm Proximal-to-mid-thigh                     0.96 cm  1.23 cm Mid thigh                                 0.95 cm  1.03 cm Mid-distal thigh                          cm       cm Distal thigh                              0.94 cm  0.91 cm Knee                                      0.99 cm  1.85 cm  IMPRESSION: 1. Bilateral great saphenous veins are not competent with reflux     >543milliseconds. 2. The deep venous system is not competent with Reflux of     >550milliseconds. 3. The calf veins could not be visualized due to skin sensitivity and     ulceration.    ___________________________________________ Janetta Hora Fields, MD  LT/MEDQ  D:  07/07/2011  T:  07/07/2011  Job:  [id number]

## 2011-07-21 ENCOUNTER — Ambulatory Visit (INDEPENDENT_AMBULATORY_CARE_PROVIDER_SITE_OTHER): Payer: Medicare Other | Admitting: Vascular Surgery

## 2011-07-21 VITALS — BP 107/72 | HR 55 | Resp 16 | Ht 69.0 in | Wt 250.0 lb

## 2011-07-21 DIAGNOSIS — I739 Peripheral vascular disease, unspecified: Secondary | ICD-10-CM

## 2011-07-21 DIAGNOSIS — I7025 Atherosclerosis of native arteries of other extremities with ulceration: Secondary | ICD-10-CM | POA: Insufficient documentation

## 2011-07-21 DIAGNOSIS — I700 Atherosclerosis of aorta: Secondary | ICD-10-CM | POA: Insufficient documentation

## 2011-07-21 DIAGNOSIS — L98499 Non-pressure chronic ulcer of skin of other sites with unspecified severity: Secondary | ICD-10-CM

## 2011-07-21 NOTE — Progress Notes (Signed)
Patient returns for followup today. She has been receiving Radio broadcast assistant therapy. Her ulcers on her lower extremity is are all now completely healed.  Physical exam: Filed Vitals:   07/21/11 1014  BP: 107/72  Pulse: 55  Resp: 16  Height: 5\' 9"  (1.753 m)  Weight: 250 lb (113.399 kg)  SpO2: 98%    Lower extremities: Brawny venous staining bilaterally with dry skin. No open ulcers.  Assessment: Healed venous stasis ulcer with inability therapy  Plan: The patient was counseled today that she needs to remain in compression or she would be at high risk of recurrence. She was given Ace wraps today the wrap her legs at that she can afford to buy new compression stockings. She will followup with me on as-needed basis.   Fabienne Bruns, MD [facility] [place] Office: [phone] Pager: [phone]

## 2011-07-26 ENCOUNTER — Other Ambulatory Visit: Payer: Self-pay | Admitting: Cardiology

## 2011-07-28 ENCOUNTER — Encounter (HOSPITAL_BASED_OUTPATIENT_CLINIC_OR_DEPARTMENT_OTHER): Payer: Medicare Other

## 2011-08-04 ENCOUNTER — Ambulatory Visit: Payer: Medicare Other | Admitting: Vascular Surgery

## 2011-08-08 ENCOUNTER — Encounter: Payer: Medicare Other | Admitting: *Deleted

## 2011-08-23 DIAGNOSIS — M5137 Other intervertebral disc degeneration, lumbosacral region: Secondary | ICD-10-CM | POA: Diagnosis not present

## 2011-08-23 DIAGNOSIS — M47817 Spondylosis without myelopathy or radiculopathy, lumbosacral region: Secondary | ICD-10-CM | POA: Diagnosis not present

## 2011-08-23 DIAGNOSIS — G894 Chronic pain syndrome: Secondary | ICD-10-CM | POA: Diagnosis not present

## 2011-08-23 DIAGNOSIS — IMO0002 Reserved for concepts with insufficient information to code with codable children: Secondary | ICD-10-CM | POA: Diagnosis not present

## 2011-08-23 DIAGNOSIS — Z79899 Other long term (current) drug therapy: Secondary | ICD-10-CM | POA: Diagnosis not present

## 2011-08-26 ENCOUNTER — Encounter: Payer: Medicare Other | Admitting: *Deleted

## 2011-08-29 ENCOUNTER — Ambulatory Visit (INDEPENDENT_AMBULATORY_CARE_PROVIDER_SITE_OTHER): Payer: Medicare Other | Admitting: *Deleted

## 2011-08-29 DIAGNOSIS — I2699 Other pulmonary embolism without acute cor pulmonale: Secondary | ICD-10-CM | POA: Diagnosis not present

## 2011-09-06 DIAGNOSIS — Z79899 Other long term (current) drug therapy: Secondary | ICD-10-CM | POA: Diagnosis not present

## 2011-09-06 DIAGNOSIS — M5137 Other intervertebral disc degeneration, lumbosacral region: Secondary | ICD-10-CM | POA: Diagnosis not present

## 2011-09-06 DIAGNOSIS — M47817 Spondylosis without myelopathy or radiculopathy, lumbosacral region: Secondary | ICD-10-CM | POA: Diagnosis not present

## 2011-09-06 DIAGNOSIS — G894 Chronic pain syndrome: Secondary | ICD-10-CM | POA: Diagnosis not present

## 2011-09-06 DIAGNOSIS — M1610 Unilateral primary osteoarthritis, unspecified hip: Secondary | ICD-10-CM | POA: Diagnosis not present

## 2011-09-20 DIAGNOSIS — M47817 Spondylosis without myelopathy or radiculopathy, lumbosacral region: Secondary | ICD-10-CM | POA: Diagnosis not present

## 2011-09-20 DIAGNOSIS — G894 Chronic pain syndrome: Secondary | ICD-10-CM | POA: Diagnosis not present

## 2011-09-20 DIAGNOSIS — M461 Sacroiliitis, not elsewhere classified: Secondary | ICD-10-CM | POA: Diagnosis not present

## 2011-09-20 DIAGNOSIS — M5137 Other intervertebral disc degeneration, lumbosacral region: Secondary | ICD-10-CM | POA: Diagnosis not present

## 2011-09-20 DIAGNOSIS — Z79899 Other long term (current) drug therapy: Secondary | ICD-10-CM | POA: Diagnosis not present

## 2011-09-21 ENCOUNTER — Telehealth: Payer: Self-pay | Admitting: Family Medicine

## 2011-09-21 NOTE — Telephone Encounter (Signed)
RC from pt.  She states she is NOT having shortness of breath.  States she told triage nurse 4 times that she did not have shortness of breath.  Pt is having pain under right breast for 8-10 days.  Pt has had "pseudo flu" during this time.  Prior to that she states she moved a heavy dresser.  Pt is worse with a deep breath.  Pain is not constant, it is worse in AM.  Pt has not taken any recent extended trips were a lot of sitting would have been required.   Advised pt per Dr. Beverely Low, that she can keep appt in the morning, but if she has any increase or worsening of symptoms overnight to seek medical care immediately.  Pt is agreeable.

## 2011-09-21 NOTE — Telephone Encounter (Signed)
Agree w/ below.  appt in AM as long as sxs remain stable.  If anything changes pt to go to ER as directed.

## 2011-09-21 NOTE — Telephone Encounter (Signed)
Call from Triage -  Reason for Call: Caller: Peggi/Patient; PCP: Sheliah Hatch.; CB#: [phone]; Has had intermittent chest pain, especially when she takes a deep breath. Has a strong hx of embolus throughout her lungs. No cough. Scheduled for 915 in am. Offered today appt. and she has no transportation.

## 2011-09-21 NOTE — Telephone Encounter (Signed)
Called pt and left a vm stating that based on her SXS and past HX she needs to go to the ER, they will provide transportation via EMS, she needs to call 911, repeated this message of importance again, MD Tabori monitoring call at present time to advise pt has been successfully instructed via voicemail, no other numbers listed in chart noted

## 2011-09-22 ENCOUNTER — Ambulatory Visit (INDEPENDENT_AMBULATORY_CARE_PROVIDER_SITE_OTHER): Payer: Medicare Other | Admitting: Family Medicine

## 2011-09-22 ENCOUNTER — Encounter: Payer: Self-pay | Admitting: Family Medicine

## 2011-09-22 ENCOUNTER — Ambulatory Visit (INDEPENDENT_AMBULATORY_CARE_PROVIDER_SITE_OTHER)
Admission: RE | Admit: 2011-09-22 | Discharge: 2011-09-22 | Disposition: A | Discharge status: Home / Self Care | Payer: Medicare Other | Source: Ambulatory Visit | Attending: Family Medicine | Admitting: Family Medicine

## 2011-09-22 ENCOUNTER — Ambulatory Visit (INDEPENDENT_AMBULATORY_CARE_PROVIDER_SITE_OTHER): Payer: Self-pay | Admitting: Cardiovascular Disease

## 2011-09-22 VITALS — BP 120/82 | HR 81 | Temp 99.4°F | Ht 68.5 in | Wt 257.6 lb

## 2011-09-22 DIAGNOSIS — R071 Chest pain on breathing: Secondary | ICD-10-CM

## 2011-09-22 DIAGNOSIS — R209 Unspecified disturbances of skin sensation: Secondary | ICD-10-CM | POA: Diagnosis not present

## 2011-09-22 DIAGNOSIS — I2699 Other pulmonary embolism without acute cor pulmonale: Secondary | ICD-10-CM

## 2011-09-22 DIAGNOSIS — R0989 Other specified symptoms and signs involving the circulatory and respiratory systems: Secondary | ICD-10-CM

## 2011-09-22 DIAGNOSIS — R350 Frequency of micturition: Secondary | ICD-10-CM

## 2011-09-22 DIAGNOSIS — R0781 Pleurodynia: Secondary | ICD-10-CM

## 2011-09-22 DIAGNOSIS — R079 Chest pain, unspecified: Secondary | ICD-10-CM | POA: Diagnosis not present

## 2011-09-22 DIAGNOSIS — R208 Other disturbances of skin sensation: Secondary | ICD-10-CM

## 2011-09-22 DIAGNOSIS — IMO0001 Reserved for inherently not codable concepts without codable children: Secondary | ICD-10-CM

## 2011-09-22 LAB — POCT URINALYSIS DIPSTICK
Protein, UA: NEGATIVE
Urobilinogen, UA: 0.2

## 2011-09-22 MED ORDER — CEPHALEXIN 500 MG PO CAPS: 500.0000 mg | ORAL_CAPSULE | Freq: Two times a day (BID) | ORAL | Status: DC

## 2011-09-22 NOTE — Patient Instructions (Signed)
Go to [address] Teacher, English as a foreign language) to get your chest xray Start the Keflex twice daily for presumed bladder infection Add Tylenol for pain relief Heating pad for pain relief We'll notify you of your xray results If the pain continues, please call Dr Vassie Loll Call with any questions or concerns Hang in there!

## 2011-09-22 NOTE — Progress Notes (Signed)
  Subjective:    Patient ID: [name], female    DOB: [birth year], 56 y.o.   MRN: [id number]  HPI Chest pain- sxs started 1/24.  neurosurg (who has been doing pain meds) left practice and pt saw PA who attempted to change pain meds from methadone to exalgo.  Had 'horrible side effects'- 'i could barely make it from bed to bathroom', craving cigarettes, junk food.  Methadone was restarted.  Feb 2nd moved dresser and then developed viral sxs after caring for sick grandkids.  Started having pain under R breast.  No recent travel but has been inactive x3 weeks.  Still on coumadin- therapeutic per pt's report.  Pain under R breast will radiate to back.  Pain is worse w/ movement or deep breath- sharp pain.  Denies SOB.  Also reports dark urine, frequency.  Denies dysuria.  'feel like i have an infection'.  Some suprapubic pressure.   Review of Systems For ROS see HPI     Objective:   Physical Exam  Vitals reviewed. Constitutional: She is oriented to person, place, and time. She appears well-developed and well-nourished. No distress.  HENT:  Head: Normocephalic and atraumatic.  Eyes: Conjunctivae and EOM are normal. Pupils are equal, round, and reactive to light.  Neck: Normal range of motion. Neck supple. No thyromegaly present.  Cardiovascular: Normal rate, regular rhythm, normal heart sounds and intact distal pulses.   No murmur heard. Pulmonary/Chest: Effort normal and breath sounds normal. No respiratory distress. She has no wheezes. She exhibits tenderness (TTP under R breast along ribs).  Abdominal: Soft. She exhibits no distension. There is tenderness (mild suprapubic tenderness). There is no rebound and no guarding.  Musculoskeletal: She exhibits no edema.  Lymphadenopathy:    She has no cervical adenopathy.  Neurological: She is alert and oriented to person, place, and time.  Skin: Skin is warm and dry.  Psychiatric: She has a normal mood and affect. Her behavior is normal.           Assessment & Plan:

## 2011-09-22 NOTE — Telephone Encounter (Signed)
Pt currently in office for a visit with MD Tabori, noted treatment given

## 2011-09-27 ENCOUNTER — Encounter: Payer: Self-pay | Admitting: *Deleted

## 2011-10-03 ENCOUNTER — Telehealth: Payer: Self-pay | Admitting: Family Medicine

## 2011-10-03 NOTE — Telephone Encounter (Signed)
Refill: Gabapentin 300 mg capsule. Take 1 capsule by mouth 3 times a day. Qty 90. Last fill 08-11-11

## 2011-10-04 MED ORDER — GABAPENTIN 300 MG PO CAPS: 300.0000 mg | ORAL_CAPSULE | Freq: Three times a day (TID) | ORAL | Status: DC

## 2011-10-04 NOTE — Assessment & Plan Note (Signed)
New.  Pt's sxs and UA consistent w/ infxn.  Start abx.  Reviewed supportive care and red flags that should prompt return.  Pt expressed understanding and is in agreement w/ plan.  

## 2011-10-04 NOTE — Telephone Encounter (Signed)
rx sent to pharmacy by e-script  

## 2011-10-04 NOTE — Assessment & Plan Note (Signed)
New.  Pt w/ hx of PE- coumadin is therapeutic.  Wrong side for cardiac pain.  Will get CXR.  Already on pain meds- add tylenol.  May be pleurisy after recent viral infxn.  Reviewed supportive care and red flags that should prompt return.  Pt expressed understanding and is in agreement w/ plan.

## 2011-10-10 ENCOUNTER — Encounter: Payer: Medicare Other | Admitting: *Deleted

## 2011-10-13 DIAGNOSIS — Z1231 Encounter for screening mammogram for malignant neoplasm of breast: Secondary | ICD-10-CM | POA: Diagnosis not present

## 2011-10-13 DIAGNOSIS — Z13820 Encounter for screening for osteoporosis: Secondary | ICD-10-CM | POA: Diagnosis not present

## 2011-10-13 DIAGNOSIS — Z124 Encounter for screening for malignant neoplasm of cervix: Secondary | ICD-10-CM | POA: Diagnosis not present

## 2011-10-13 DIAGNOSIS — N951 Menopausal and female climacteric states: Secondary | ICD-10-CM | POA: Diagnosis not present

## 2011-10-13 DIAGNOSIS — F17200 Nicotine dependence, unspecified, uncomplicated: Secondary | ICD-10-CM | POA: Diagnosis not present

## 2011-10-13 DIAGNOSIS — Z1212 Encounter for screening for malignant neoplasm of rectum: Secondary | ICD-10-CM | POA: Diagnosis not present

## 2011-11-10 ENCOUNTER — Ambulatory Visit (INDEPENDENT_AMBULATORY_CARE_PROVIDER_SITE_OTHER): Payer: Medicare Other | Admitting: Pharmacist

## 2011-11-10 DIAGNOSIS — I2699 Other pulmonary embolism without acute cor pulmonale: Secondary | ICD-10-CM | POA: Diagnosis not present

## 2011-11-10 LAB — POCT INR: INR: 1.8

## 2011-11-30 NOTE — Telephone Encounter (Signed)
Close  

## 2011-12-08 ENCOUNTER — Ambulatory Visit (INDEPENDENT_AMBULATORY_CARE_PROVIDER_SITE_OTHER): Payer: Medicare Other | Admitting: *Deleted

## 2011-12-08 DIAGNOSIS — I2699 Other pulmonary embolism without acute cor pulmonale: Secondary | ICD-10-CM | POA: Diagnosis not present

## 2011-12-08 MED ORDER — FUROSEMIDE 40 MG PO TABS: 40.0000 mg | ORAL_TABLET | ORAL | Status: DC | PRN

## 2011-12-12 ENCOUNTER — Other Ambulatory Visit: Payer: Self-pay | Admitting: Cardiology

## 2012-01-09 ENCOUNTER — Ambulatory Visit: Payer: Medicare Other | Admitting: Internal Medicine

## 2012-01-11 ENCOUNTER — Encounter: Payer: Self-pay | Admitting: Cardiology

## 2012-01-11 ENCOUNTER — Ambulatory Visit (INDEPENDENT_AMBULATORY_CARE_PROVIDER_SITE_OTHER): Payer: Medicare Other | Admitting: Cardiology

## 2012-01-11 ENCOUNTER — Ambulatory Visit (INDEPENDENT_AMBULATORY_CARE_PROVIDER_SITE_OTHER): Payer: Medicare Other | Admitting: *Deleted

## 2012-01-11 VITALS — BP 102/60 | HR 76 | Resp 16 | Ht 69.0 in | Wt 259.8 lb

## 2012-01-11 DIAGNOSIS — I872 Venous insufficiency (chronic) (peripheral): Secondary | ICD-10-CM | POA: Diagnosis not present

## 2012-01-11 DIAGNOSIS — I2699 Other pulmonary embolism without acute cor pulmonale: Secondary | ICD-10-CM | POA: Diagnosis not present

## 2012-01-11 DIAGNOSIS — I2510 Atherosclerotic heart disease of native coronary artery without angina pectoris: Secondary | ICD-10-CM | POA: Diagnosis not present

## 2012-01-11 LAB — POCT INR: INR: 3.2

## 2012-01-11 NOTE — Patient Instructions (Signed)
Your physician wants you to follow-up in: 1 year. You will receive a reminder letter in the mail two months in advance. If you don't receive a letter, please call our office to schedule the follow-up appointment.  

## 2012-01-11 NOTE — Assessment & Plan Note (Signed)
Her venous disease is being treated well by the vascular team

## 2012-01-11 NOTE — Assessment & Plan Note (Signed)
Patient has minimal coronary disease by history. No further workup.

## 2012-01-11 NOTE — Progress Notes (Signed)
HPI Patient is seen for cardiology followup. She has minimal coronary disease. She had a significant pulmonary embolus in 2008 with significant right ventricular dysfunction. Echo in 2010 revealed improved function. She's been stable. She's very careful on her Coumadin. She's not having any significant chest pain. She has significant venous disease. She required wrapping of her legs in the past several months and this helped.  The patient mentions that with eye exams recently she has possible some early detachment of her retina. This is being followed carefully.  Allergies  Allergen Reactions  . Morphine     REACTION: nausea, rash, itching, vomiting  . Nsaids     Bowel irregularity  . Venlafaxine     Current Outpatient Prescriptions  Medication Sig Dispense Refill  . aspirin 81 MG tablet Take 81 mg by mouth daily.        . Calcium Carbonate Antacid (ROLAIDS EXTRA STRENGTH) 1177 MG CHEW Chew by mouth as needed.        . calcium-vitamin D (OYSCO D) 250-125 MG-UNIT per tablet Take 1 tablet by mouth daily.        . Cyanocobalamin (B-12) 1500 MCG TBCR Take by mouth daily.        . folic acid (FOLVITE) 1 MG tablet Take 1 mg by mouth daily.        . furosemide (LASIX) 40 MG tablet Take 1 tablet (40 mg total) by mouth as needed.  30 tablet  11  . HYDROmorphone (DILAUDID) 2 MG tablet Take 4 mg by mouth as needed. For breakthrough pain      . magnesium hydroxide (MILK OF MAGNESIA) 400 MG/5ML suspension Take by mouth as needed.        . methadone (DOLOPHINE) 10 MG tablet Take 10 mg by mouth. Take 2 tabs 2 times daily      . pyridoxine (BL VITAMIN B-6) 100 MG tablet Take 100 mg by mouth daily.        . RABEprazole (ACIPHEX) 20 MG tablet Take 1 tablet (20 mg total) by mouth daily.  30 tablet  1  . vitamin C (ASCORBIC ACID) 500 MG tablet Take 500 mg by mouth daily.        Marland Kitchen warfarin (COUMADIN) 5 MG tablet TAKE AS DIRECTED BY ANTICOAGULATION CLINIC  60 tablet  3    History   Social History  .  Marital Status: Divorced    Spouse Name: N/A    Number of Children: 1  . Years of Education: N/A   Occupational History  . Disabled    Social History Main Topics  . Smoking status: Former Smoker    Types: Cigarettes    Quit date: 06/09/2011  . Smokeless tobacco: Never Used   Comment: 2 ciggs a day  . Alcohol Use: No  . Drug Use: No  . Sexually Active: Not on file   Other Topics Concern  . Not on file   Social History Narrative   Current smoker wi last 12 mos.     Family History  Problem Relation Age of Onset  . Crohn's disease Sister   . Heart disease Mother   . Heart disease Father     Past Medical History  Diagnosis Date  . Other pulmonary embolism and infarction     Significant 2008 and coumadin therapy RV dysfunction...echo...2008..EF 50%..right ventricle markedly dilated w marked right ventricular dysfunc and moder tricuspid regurg/echo..March 2010, Ef 50%, mild dilation of right ventricule w mild decrease right ventric function   .  Hypopotassemia   . Tobacco use disorder   . Anemia, unspecified   . Hypotension, unspecified   . Chronic low back pain   . Coronary atherosclerosis of unspecified type of vessel, native or graft     minimal catheterization, October 2008  . Chest pain     with stress  . Constipation     and diarrhea chronic..Dr. Barnet Pall  . Right bundle branch block     intermittent  . Leg pain   . Back pain   . Methadone adverse reaction     for chronic leg and back pain  . Homocystinemia     signif elevation in the past...plan folic acid, B6, B12  . Lymphoproliferative disease     disorder in the past??  . Cellulitis   . Thyroid disease   . Peripheral vascular disease   . CAD (coronary artery disease)     Past Surgical History  Procedure Date  . Cholecystectomy   . Gastric bypass   . Tonsillectomy   . Joint replacement     right total hip replacement  . Rotator cuff repair     right shoulder    ROS Patient denies fever,  chills, headache, sweats, rash, , change in hearing, chest pain, cough, nausea vomiting, urinary symptoms.All other systems are reviewed and are negative other than the history of present illness.  PHYSICAL EXAM Patient is oriented to person time and place. Affect is normal. There is no jugulovenous distention. Lungs are clear. Respiratory effort is nonlabored. Cardiac exam reveals S1 and S2. There no clicks or significant murmurs. The abdomen is soft. The patient has significant venous disease. It is stable at this point.  Filed Vitals:   01/11/12 1522  BP: 102/60  Pulse: 76  Resp: 16  Height: 5\' 9"  (1.753 m)  Weight: 259 lb 12 oz (117.822 kg)    ASSESSMENT & PLAN

## 2012-01-11 NOTE — Assessment & Plan Note (Signed)
Patient has recovered nicely from her extensive pulmonary embolic disease. She must remain on Coumadin.

## 2012-02-03 ENCOUNTER — Ambulatory Visit (INDEPENDENT_AMBULATORY_CARE_PROVIDER_SITE_OTHER): Payer: Medicare Other | Admitting: *Deleted

## 2012-02-03 DIAGNOSIS — I2699 Other pulmonary embolism without acute cor pulmonale: Secondary | ICD-10-CM

## 2012-03-28 ENCOUNTER — Telehealth: Payer: Self-pay | Admitting: *Deleted

## 2012-03-28 NOTE — Telephone Encounter (Signed)
LMOM for pt in regards to follow up CV[facility] appt. Last seen in 01/2012.

## 2012-04-02 ENCOUNTER — Ambulatory Visit (INDEPENDENT_AMBULATORY_CARE_PROVIDER_SITE_OTHER): Payer: Medicare Other | Admitting: Pharmacist

## 2012-04-02 ENCOUNTER — Telehealth: Payer: Self-pay | Admitting: Family Medicine

## 2012-04-02 DIAGNOSIS — I2699 Other pulmonary embolism without acute cor pulmonale: Secondary | ICD-10-CM | POA: Diagnosis not present

## 2012-04-02 LAB — POCT INR: INR: 2.8

## 2012-04-02 NOTE — Telephone Encounter (Signed)
Pt called & stated she stopped taking her medication (LASIX) 5-6 months ago, said it made her have to urinate continuously - do you want her to go back on it? Cb# [id number]

## 2012-04-02 NOTE — Telephone Encounter (Signed)
.  left message to have patient return my call to advise she needs to come in for an OV to discuss

## 2012-04-03 NOTE — Telephone Encounter (Signed)
Pt returned your call. I scheduled her for a follow-up appt 04/18/12 @ 4p to discuss Lasix. Pt is sharing a vehicle with her daughter and this was the first available time that worked for the patient.

## 2012-04-03 NOTE — Telephone Encounter (Signed)
MD Beverely Low made aware verbally that pt has upcoming apt, MD Beverely Low noted aware of pt vehicle issues, will see at upcoming apt

## 2012-04-11 ENCOUNTER — Encounter: Payer: Self-pay | Admitting: Vascular Surgery

## 2012-04-12 ENCOUNTER — Ambulatory Visit (INDEPENDENT_AMBULATORY_CARE_PROVIDER_SITE_OTHER): Payer: Medicare Other | Admitting: Vascular Surgery

## 2012-04-12 ENCOUNTER — Encounter: Payer: Self-pay | Admitting: Vascular Surgery

## 2012-04-12 VITALS — BP 112/65 | HR 64 | Resp 16 | Ht 69.0 in | Wt 275.0 lb

## 2012-04-12 DIAGNOSIS — L98499 Non-pressure chronic ulcer of skin of other sites with unspecified severity: Secondary | ICD-10-CM

## 2012-04-12 DIAGNOSIS — I739 Peripheral vascular disease, unspecified: Secondary | ICD-10-CM | POA: Diagnosis not present

## 2012-04-12 NOTE — Progress Notes (Signed)
[name] is a 56 y.o. female who presents with chief complaint: swollen leg with ulceration.  She has also had multiple prior exacerbations and remissions of ulcerations since her prior DVT in 2008. She stated that her most recent episode started around Thanksgiving after she got very swollen legs. The patient has had a history of DVT and pulmonary embolus. She is on chronic coumadin. There is a family history of varicose veins. The patient has used compression stockings in the past but has not worn them for several months. She continues to be non-compliant with compression stockings.  She recently has developed several scattered ulcerations on her lower extremities and these are all in various states of healing  Review of systems: She denies shortness of breath. She denies chest pain.  Physical exam: Filed Vitals:   04/12/12 1603  BP: 112/65  Pulse: 64  Resp: 16  Height: 5\' 9"  (1.753 m)  Weight: 275 lb (124.739 kg)  SpO2: 100%   Lower extremities: Scattered 2-3 mm ulcerations with brawny staining gaiter area bilaterally, pink warm and well-perfused feet  Assessment: Recurrent venous stasis ulcers  Plan: In a boot bilaterally change once weekly x4 weeks emphasized the patient today the importance of continued compression therapy after the ulcers are healed. Emphasized to her how important compression therapy is in preventing these ulcers from occurring as well as protecting her skin long-term  Fabienne Bruns, MD [facility] [place] Office: [phone] Pager: [phone]

## 2012-04-18 ENCOUNTER — Ambulatory Visit: Payer: Medicare Other | Admitting: Family Medicine

## 2012-04-18 DIAGNOSIS — Z0289 Encounter for other administrative examinations: Secondary | ICD-10-CM

## 2012-04-19 ENCOUNTER — Ambulatory Visit (INDEPENDENT_AMBULATORY_CARE_PROVIDER_SITE_OTHER): Payer: Medicare Other | Admitting: Vascular Surgery

## 2012-04-19 VITALS — BP 115/63 | HR 62 | Temp 98.2°F | Ht 69.0 in | Wt 275.0 lb

## 2012-04-19 DIAGNOSIS — I739 Peripheral vascular disease, unspecified: Secondary | ICD-10-CM

## 2012-04-19 DIAGNOSIS — L98499 Non-pressure chronic ulcer of skin of other sites with unspecified severity: Secondary | ICD-10-CM

## 2012-04-19 NOTE — Progress Notes (Signed)
Patient is a 56 year old female who has been undergoing Unna boot therapy for venous stasis ulcers. She returns today for followup. She recently removed her left Unna boot that she felt it was too tight. She also has an area on her right heel that she thinks is sore from the [employer].  Review of systems: She denies shortness of breath. She denies chest pain.  Physical exam: Filed Vitals:   04/19/12 1544  BP: 115/63  Pulse: 62  Temp: 98.2 F (36.8 C)  TempSrc: Oral  Height: 5\' 9"  (1.753 m)  Weight: 275 lb (124.739 kg)  SpO2: 96%    Lower extremities: Ulcerations on both lower extremities are completely healed. She still has brawny venous staining bilaterally. She has several areas of petechiae up near the proximal aspect of for the [facility] boot was on the cath. This is most likely because the Unna boot was slightly tight. She also had a reddened area on her right Achilles tendon but no skin break. This is slightly tender to palpation.  Assessment: Healed ulcerations bilateral lower extremities  Plan: We will discontinue the Unna boot at this time especially since she has had some complaints with it.  I emphasized to the patient that compression therapy is going to be very important to prevent recurrence. She will followup on as-needed basis. She was placed in bilateral Ace wraps today until she can purchase her compression stockings.  Fabienne Bruns, MD [facility] [place] Office: [phone] Pager: [phone]

## 2012-04-26 ENCOUNTER — Encounter: Payer: Medicare Other | Admitting: Neurosurgery

## 2012-05-02 ENCOUNTER — Ambulatory Visit (INDEPENDENT_AMBULATORY_CARE_PROVIDER_SITE_OTHER): Payer: Medicare Other | Admitting: *Deleted

## 2012-05-02 DIAGNOSIS — I2699 Other pulmonary embolism without acute cor pulmonale: Secondary | ICD-10-CM

## 2012-05-02 LAB — POCT INR: INR: 2.4

## 2012-05-02 MED ORDER — WARFARIN SODIUM 5 MG PO TABS: 5.0000 mg | ORAL_TABLET | ORAL | Status: DC

## 2012-05-10 ENCOUNTER — Encounter: Payer: Medicare Other | Admitting: Vascular Surgery

## 2012-05-10 DIAGNOSIS — M545 Low back pain, unspecified: Secondary | ICD-10-CM | POA: Diagnosis not present

## 2012-05-29 IMAGING — CR DG CHEST 2V
2 series · 2 of 2 positions shown · non-contrast
Comparison: 04/22/2011 and earlier.

CLINICAL DATA: 55-year-old female with right anterior chest pain.
History of pulmonary embolus.

CHEST - 2 VIEW

[view not recorded (1 of 2)]
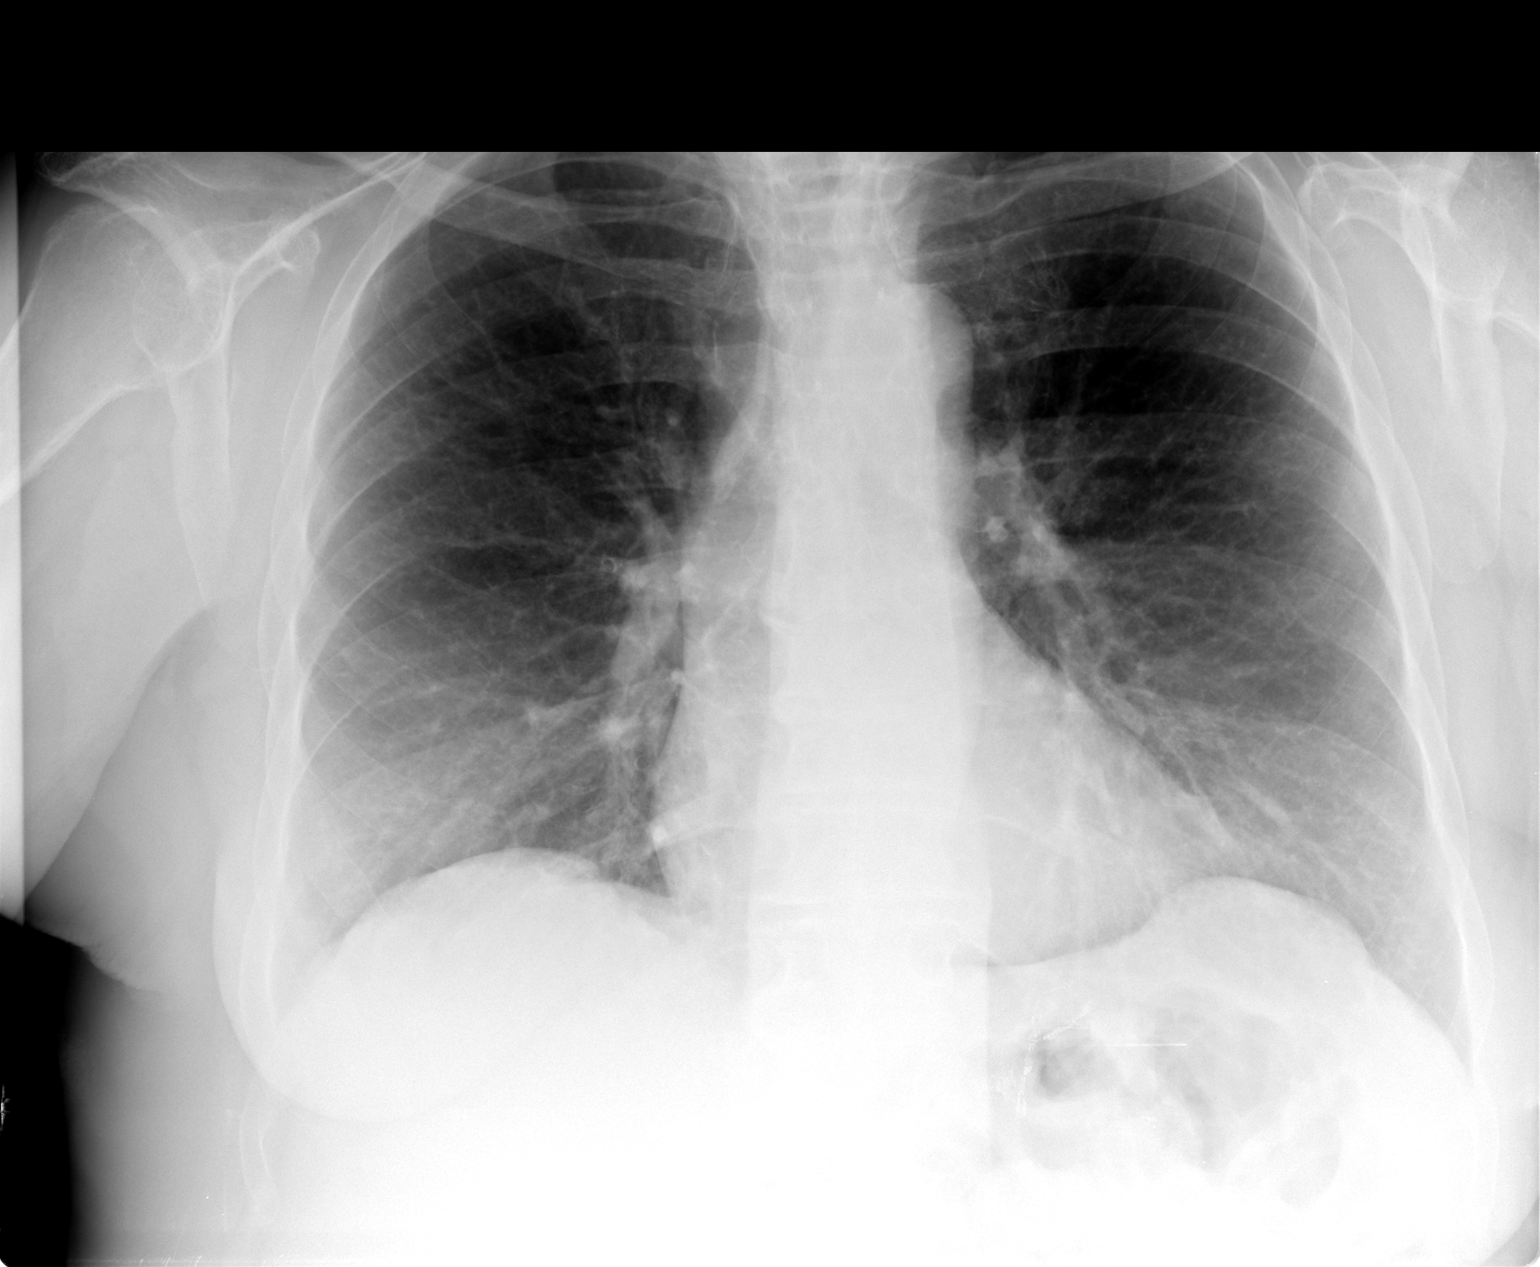

[view not recorded (2 of 2)]
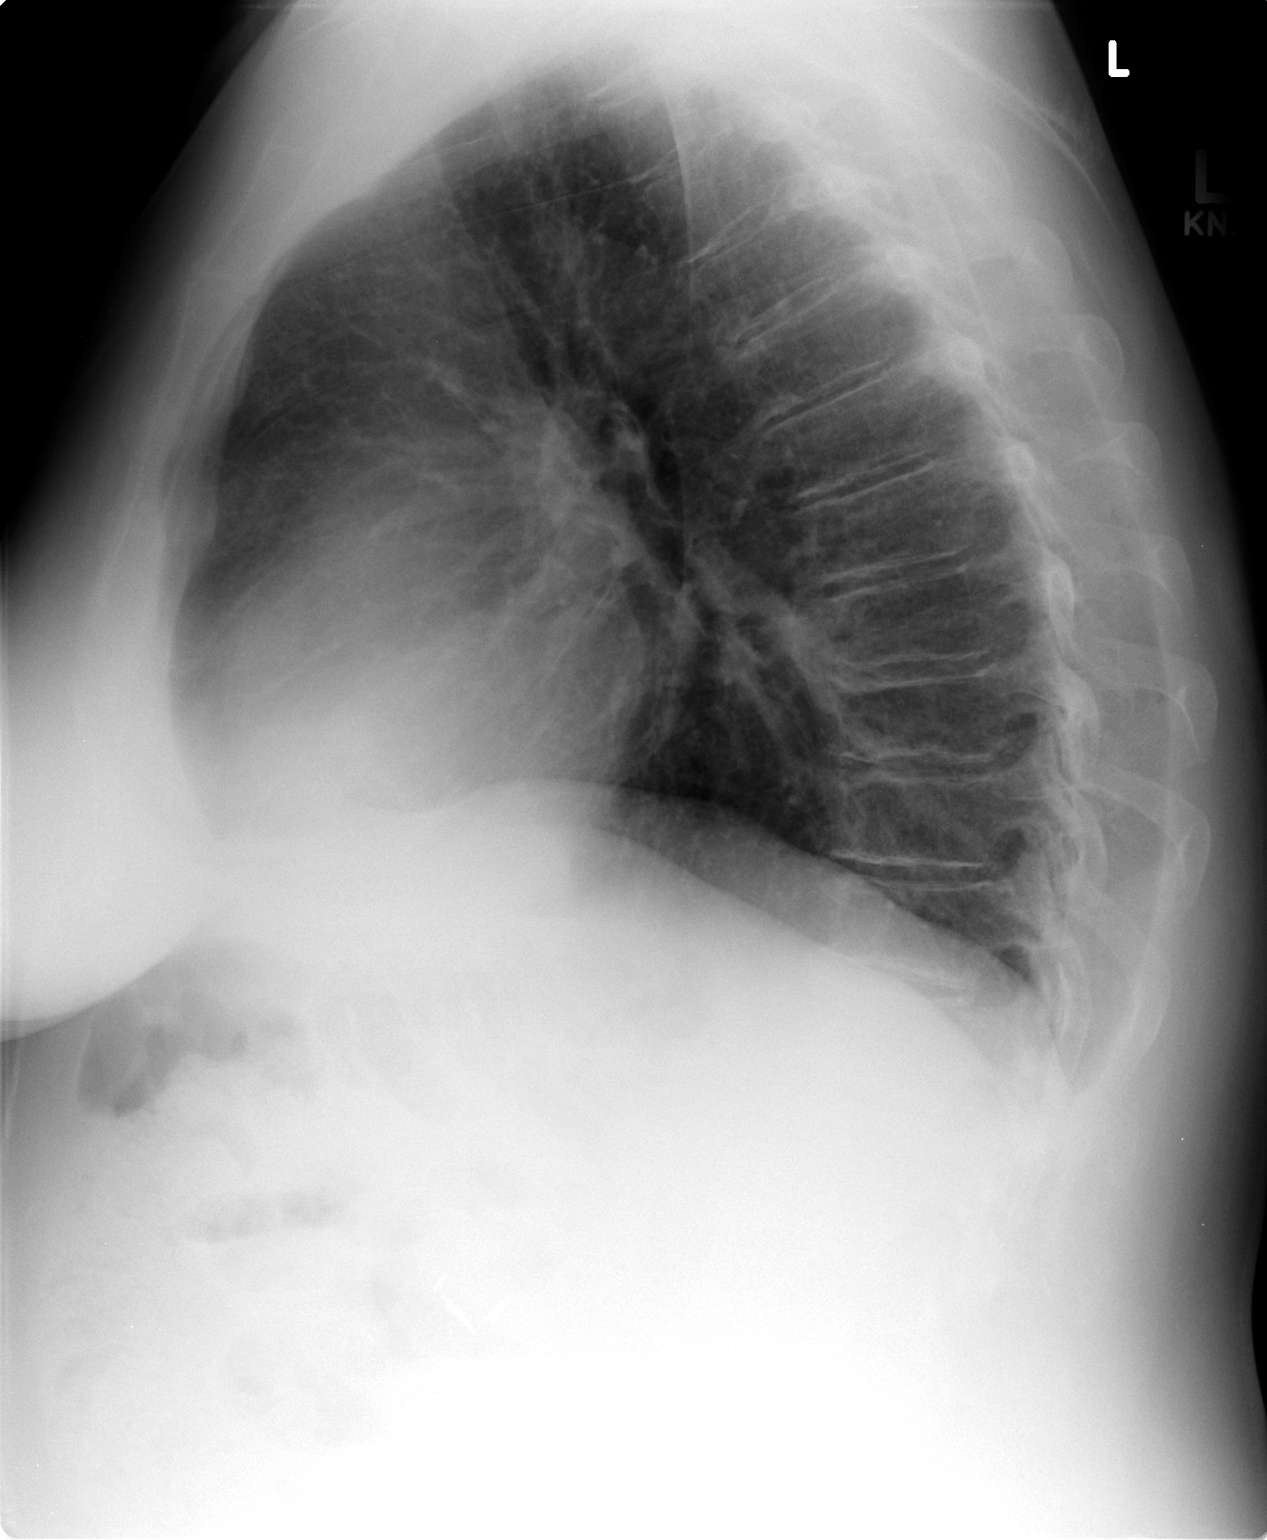

[2 of 2 positions shown; findings below may reference images not displayed]

FINDINGS: Stable lung volumes.  Mild eventration of the diaphragm.
Cardiac size and mediastinal contours are within normal limits.  No
pneumothorax, pulmonary edema, pleural effusion or acute pulmonary
opacity.  Stable increased interstitial markings diffusely.
Visualized tracheal air column is within normal limits.  No acute
osseous abnormality identified.  Epigastric and other abdominal
postoperative changes.
IMPRESSION: No acute cardiopulmonary abnormality.

## 2012-06-05 ENCOUNTER — Other Ambulatory Visit: Payer: Self-pay | Admitting: Cardiology

## 2012-06-13 ENCOUNTER — Ambulatory Visit (INDEPENDENT_AMBULATORY_CARE_PROVIDER_SITE_OTHER): Payer: Medicare Other | Admitting: *Deleted

## 2012-06-13 DIAGNOSIS — I2699 Other pulmonary embolism without acute cor pulmonale: Secondary | ICD-10-CM | POA: Diagnosis not present

## 2012-06-13 LAB — POCT INR: INR: 1.8

## 2012-06-22 DIAGNOSIS — Z23 Encounter for immunization: Secondary | ICD-10-CM | POA: Diagnosis not present

## 2012-07-07 ENCOUNTER — Encounter (HOSPITAL_COMMUNITY): Payer: Self-pay | Admitting: *Deleted

## 2012-07-07 ENCOUNTER — Emergency Department (HOSPITAL_COMMUNITY)
Admission: EM | Admit: 2012-07-07 | Discharge: 2012-07-07 | Disposition: A | Discharge status: Home / Self Care | Payer: Medicare Other | Attending: Emergency Medicine | Admitting: Emergency Medicine

## 2012-07-07 ENCOUNTER — Emergency Department (HOSPITAL_COMMUNITY): Payer: Medicare Other

## 2012-07-07 DIAGNOSIS — Z7901 Long term (current) use of anticoagulants: Secondary | ICD-10-CM

## 2012-07-07 DIAGNOSIS — Z8639 Personal history of other endocrine, nutritional and metabolic disease: Secondary | ICD-10-CM | POA: Insufficient documentation

## 2012-07-07 DIAGNOSIS — R5383 Other fatigue: Secondary | ICD-10-CM | POA: Insufficient documentation

## 2012-07-07 DIAGNOSIS — M545 Low back pain, unspecified: Secondary | ICD-10-CM | POA: Insufficient documentation

## 2012-07-07 DIAGNOSIS — E876 Hypokalemia: Secondary | ICD-10-CM

## 2012-07-07 DIAGNOSIS — I872 Venous insufficiency (chronic) (peripheral): Secondary | ICD-10-CM | POA: Diagnosis not present

## 2012-07-07 DIAGNOSIS — Z79899 Other long term (current) drug therapy: Secondary | ICD-10-CM | POA: Diagnosis not present

## 2012-07-07 DIAGNOSIS — Z7982 Long term (current) use of aspirin: Secondary | ICD-10-CM | POA: Insufficient documentation

## 2012-07-07 DIAGNOSIS — E7210 Disorders of sulfur-bearing amino-acid metabolism, unspecified: Secondary | ICD-10-CM | POA: Diagnosis not present

## 2012-07-07 DIAGNOSIS — R599 Enlarged lymph nodes, unspecified: Secondary | ICD-10-CM | POA: Insufficient documentation

## 2012-07-07 DIAGNOSIS — Z862 Personal history of diseases of the blood and blood-forming organs and certain disorders involving the immune mechanism: Secondary | ICD-10-CM | POA: Insufficient documentation

## 2012-07-07 DIAGNOSIS — I4510 Unspecified right bundle-branch block: Secondary | ICD-10-CM | POA: Insufficient documentation

## 2012-07-07 DIAGNOSIS — I2699 Other pulmonary embolism without acute cor pulmonale: Secondary | ICD-10-CM | POA: Insufficient documentation

## 2012-07-07 DIAGNOSIS — R197 Diarrhea, unspecified: Secondary | ICD-10-CM | POA: Diagnosis not present

## 2012-07-07 DIAGNOSIS — N281 Cyst of kidney, acquired: Secondary | ICD-10-CM

## 2012-07-07 DIAGNOSIS — D259 Leiomyoma of uterus, unspecified: Secondary | ICD-10-CM | POA: Diagnosis not present

## 2012-07-07 DIAGNOSIS — I959 Hypotension, unspecified: Secondary | ICD-10-CM | POA: Insufficient documentation

## 2012-07-07 DIAGNOSIS — M7981 Nontraumatic hematoma of soft tissue: Secondary | ICD-10-CM | POA: Diagnosis not present

## 2012-07-07 DIAGNOSIS — K5900 Constipation, unspecified: Secondary | ICD-10-CM | POA: Diagnosis not present

## 2012-07-07 DIAGNOSIS — G8929 Other chronic pain: Secondary | ICD-10-CM | POA: Insufficient documentation

## 2012-07-07 DIAGNOSIS — R112 Nausea with vomiting, unspecified: Secondary | ICD-10-CM | POA: Insufficient documentation

## 2012-07-07 DIAGNOSIS — Z87891 Personal history of nicotine dependence: Secondary | ICD-10-CM | POA: Diagnosis not present

## 2012-07-07 DIAGNOSIS — D47Z9 Other specified neoplasms of uncertain behavior of lymphoid, hematopoietic and related tissue: Secondary | ICD-10-CM | POA: Diagnosis not present

## 2012-07-07 DIAGNOSIS — I2510 Atherosclerotic heart disease of native coronary artery without angina pectoris: Secondary | ICD-10-CM | POA: Diagnosis not present

## 2012-07-07 DIAGNOSIS — R5381 Other malaise: Secondary | ICD-10-CM | POA: Diagnosis not present

## 2012-07-07 DIAGNOSIS — K838 Other specified diseases of biliary tract: Secondary | ICD-10-CM | POA: Diagnosis not present

## 2012-07-07 DIAGNOSIS — R590 Localized enlarged lymph nodes: Secondary | ICD-10-CM

## 2012-07-07 LAB — URINALYSIS, MICROSCOPIC ONLY
Bilirubin Urine: NEGATIVE
Glucose, UA: NEGATIVE mg/dL
Ketones, ur: NEGATIVE mg/dL
Nitrite: NEGATIVE
Protein, ur: NEGATIVE mg/dL
Specific Gravity, Urine: 1.013 (ref 1.005–1.030)
Urobilinogen, UA: 1 mg/dL (ref 0.0–1.0)
pH: 6.5 (ref 5.0–8.0)

## 2012-07-07 LAB — COMPREHENSIVE METABOLIC PANEL
ALT: 21 U/L (ref 0–35)
Albumin: 3.4 g/dL — ABNORMAL LOW (ref 3.5–5.2)
Alkaline Phosphatase: 72 U/L (ref 39–117)
BUN: 4 mg/dL — ABNORMAL LOW (ref 6–23)
Calcium: 8.8 mg/dL (ref 8.4–10.5)
GFR calc Af Amer: 87 mL/min — ABNORMAL LOW (ref >90)
Glucose, Bld: 84 mg/dL (ref 70–99)
Potassium: 2.8 mEq/L — ABNORMAL LOW (ref 3.5–5.1)
Sodium: 139 mEq/L (ref 135–145)
Total Protein: 7.7 g/dL (ref 6.0–8.3)

## 2012-07-07 LAB — CBC WITH DIFFERENTIAL/PLATELET
Basophils Relative: 1 % (ref 0–1)
Eosinophils Absolute: 0.1 10*3/uL (ref 0.0–0.7)
Eosinophils Relative: 2 % (ref 0–5)
MCH: 29 pg (ref 26.0–34.0)
MCHC: 32.9 g/dL (ref 30.0–36.0)
MCV: 88.2 fL (ref 78.0–100.0)
Neutrophils Relative %: 58 % (ref 43–77)
Platelets: 152 10*3/uL (ref 150–400)

## 2012-07-07 LAB — PROTIME-INR
INR: 2.99 — ABNORMAL HIGH (ref 0.00–1.49)
Prothrombin Time: 29.5 s — ABNORMAL HIGH (ref 11.6–15.2)

## 2012-07-07 LAB — LIPASE, BLOOD: Lipase: 23 U/L (ref 11–59)

## 2012-07-07 LAB — POCT I-STAT TROPONIN I: Troponin i, poc: 0 ng/mL (ref 0.00–0.08)

## 2012-07-07 MED ORDER — SODIUM CHLORIDE 0.9 % IV BOLUS (SEPSIS)
1000.0000 mL | Freq: Once | INTRAVENOUS | Status: AC
  Administered 2012-07-07: 1000 mL via INTRAVENOUS

## 2012-07-07 MED ORDER — IOHEXOL 300 MG/ML  SOLN
20.0000 mL | INTRAMUSCULAR | Status: AC
  Administered 2012-07-07: 20 mL via ORAL

## 2012-07-07 MED ORDER — ONDANSETRON HCL 4 MG/2ML IJ SOLN
4.0000 mg | Freq: Once | INTRAMUSCULAR | Status: AC
  Administered 2012-07-07: 4 mg via INTRAVENOUS
  Filled 2012-07-07: qty 2

## 2012-07-07 MED ORDER — HYDROMORPHONE HCL PF 1 MG/ML IJ SOLN
1.0000 mg | Freq: Once | INTRAMUSCULAR | Status: AC
  Administered 2012-07-07: 1 mg via INTRAVENOUS
  Filled 2012-07-07: qty 1

## 2012-07-07 MED ORDER — IOHEXOL 300 MG/ML  SOLN
100.0000 mL | Freq: Once | INTRAMUSCULAR | Status: AC | PRN
  Administered 2012-07-07: 100 mL via INTRAVENOUS

## 2012-07-07 MED ORDER — POTASSIUM CHLORIDE CRYS ER 20 MEQ PO TBCR
40.0000 meq | EXTENDED_RELEASE_TABLET | Freq: Once | ORAL | Status: AC
  Administered 2012-07-07: 40 meq via ORAL
  Filled 2012-07-07: qty 2

## 2012-07-07 NOTE — ED Notes (Signed)
MD at bedside for pt disposition questions, pt verbalizes understanding of change in coumadin medications.

## 2012-07-07 NOTE — ED Notes (Signed)
Pt assisted to bathroom for urine specimen

## 2012-07-07 NOTE — ED Provider Notes (Signed)
History     CSN: [id number]  Arrival date & time 07/07/12  1507   First MD Initiated Contact with Patient 07/07/12 1911      Chief Complaint  Patient presents with  . Abdominal Pain  . Emesis    (Consider location/radiation/quality/duration/timing/severity/associated sxs/prior treatment) The history is provided by the patient and medical records.    [name] is a 56 y.o. female presents to the emergency department c/o RLQ abdominal.  Patient states the pain began on Tuesday , began gradually has been persistent and gradually increased. Patient states it was associated with nausea vomiting and diarrhea. Patient states nausea vomiting and diarrhea resolved on Wednesday, but the pain has persisted. She describes the pain as sharp, located in the right lower quadrant, rated at a 6/10 and nonradiating.  She states sitting and walking makes it worse, lying flat in holding her side makes it better. She states she also has generalized weakness and fatigue since this began.She denies fever, chills, headache, neck pain, chest pain, shortness of breath, cough, congestion, rhinorrhea, nasal congestion, dizziness, syncope.  His chronic back pain for which she takes methadone as prescribed by her chronic pain clinic.  She states this medicine has also helped her to feel better.  She also states that since Wednesday she has been able to tolerate weak coffee and water without vomiting. She has had no more diarrhea.  Past Medical History  Diagnosis Date  . Other pulmonary embolism and infarction     Significant 2008 and coumadin therapy RV dysfunction...echo...2008..EF 50%..right ventricle markedly dilated w marked right ventricular dysfunc and moder tricuspid regurg/echo..March 2010, Ef 50%, mild dilation of right ventricule w mild decrease right ventric function   . Hypopotassemia   . Tobacco use disorder   . Anemia, unspecified   . Hypotension, unspecified   . Chronic low back pain   .  Coronary atherosclerosis of unspecified type of vessel, native or graft     minimal catheterization, October 2008  . Chest pain     with stress  . Constipation     and diarrhea chronic..Dr. Barnet Pall  . Right bundle branch block     intermittent  . Leg pain   . Back pain   . Methadone adverse reaction     for chronic leg and back pain  . Homocystinemia     signif elevation in the past...plan folic acid, B6, B12  . Lymphoproliferative disease     disorder in the past??  . Cellulitis   . Thyroid disease   . Peripheral vascular disease   . CAD (coronary artery disease)   . Venous insufficiency (chronic) (peripheral)     Patient's legs were wrapped 2013    Past Surgical History  Procedure Date  . Cholecystectomy   . Gastric bypass   . Tonsillectomy   . Joint replacement     right total hip replacement  . Rotator cuff repair     right shoulder    Family History  Problem Relation Age of Onset  . Crohn's disease Sister   . Heart disease Mother   . Heart disease Father     History  Substance Use Topics  . Smoking status: Former Smoker    Types: Cigarettes    Quit date: 06/09/2011  . Smokeless tobacco: Never Used     Comment: 2 ciggs a day  . Alcohol Use: No    OB History    Grav Para Term Preterm Abortions TAB SAB Ect Mult  Living                  Review of Systems  Constitutional: Positive for appetite change. Negative for fever, diaphoresis, fatigue and unexpected weight change.  HENT: Negative for mouth sores, trouble swallowing, neck pain and neck stiffness.   Respiratory: Negative for cough, chest tightness, shortness of breath, wheezing and stridor.   Cardiovascular: Negative for chest pain and palpitations.  Gastrointestinal: Positive for nausea, vomiting, abdominal pain and diarrhea. Negative for constipation, blood in stool, abdominal distention and rectal pain.  Genitourinary: Negative for dysuria, urgency, frequency, hematuria, flank pain and  difficulty urinating.  Musculoskeletal: Negative for back pain.  Skin: Negative for rash.  Neurological: Negative for weakness.  Hematological: Negative for adenopathy.  Psychiatric/Behavioral: Negative for confusion.  All other systems reviewed and are negative.    Allergies  Morphine; Nsaids; and Venlafaxine  Home Medications   Current Outpatient Rx  Name  Route  Sig  Dispense  Refill  . ASPIRIN 81 MG PO TABS   Oral   Take 81 mg by mouth daily.           . B COMPLEX VITAMINS PO CAPS   Oral   Take 1 capsule by mouth daily.         Marland Kitchen CALCIUM CARBONATE ANTACID 1177 MG PO CHEW   Oral   Chew 1 tablet by mouth as needed. For vomiting         . B-12 1500 MCG PO TBCR   Oral   Take 1 tablet by mouth daily.          Marland Kitchen HYDROMORPHONE HCL 2 MG PO TABS   Oral   Take 4 mg by mouth every 4 (four) hours as needed. For breakthrough pain         . MAGNESIUM HYDROXIDE 400 MG/5ML PO SUSP   Oral   Take 30 mLs by mouth as needed. For nausea         . METHADONE HCL 10 MG PO TABS   Oral   Take 20 mg by mouth 2 (two) times daily.          Marland Kitchen VITAMIN C 500 MG PO TABS   Oral   Take 500 mg by mouth daily.           . WARFARIN SODIUM 5 MG PO TABS   Oral   Take 5-10 mg by mouth daily. Take 5mg  on M,W,and F. Take 10mg  all other days         . RABEPRAZOLE SODIUM 20 MG PO TBEC   Oral   Take 1 tablet (20 mg total) by mouth daily.   30 tablet   1     BP 115/68  Pulse 58  Temp 98 F (36.7 C) (Oral)  Resp 14  SpO2 95%  Physical Exam  Nursing note and vitals reviewed. Constitutional: She is oriented to person, place, and time. She appears well-developed and well-nourished.  HENT:  Head: Normocephalic and atraumatic.  Mouth/Throat: Oropharynx is clear and moist.  Eyes: Conjunctivae normal are normal. Pupils are equal, round, and reactive to light. No scleral icterus.  Neck: Normal range of motion.  Cardiovascular: Normal rate, regular rhythm, normal heart sounds  and intact distal pulses.  Exam reveals no gallop and no friction rub.   No murmur heard. Pulmonary/Chest: Effort normal and breath sounds normal. No respiratory distress. She has no wheezes. She has no rales. She exhibits no tenderness.  Abdominal: Soft. Bowel sounds are  normal. She exhibits no distension, no pulsatile midline mass and no mass. There is tenderness in the right lower quadrant and periumbilical area. There is guarding. There is no rigidity, no rebound, no CVA tenderness and negative Murphy's sign.       obese  Musculoskeletal: Normal range of motion. She exhibits no tenderness.  Lymphadenopathy:    She has no cervical adenopathy.  Neurological: She is alert and oriented to person, place, and time. She exhibits normal muscle tone. Coordination normal.  Skin: Skin is warm and dry.       Chronic venous stasis skin changes on the lower extremities bilaterally  Psychiatric: She has a normal mood and affect.    ED Course  Procedures (including critical care time)  Labs Reviewed  COMPREHENSIVE METABOLIC PANEL - Abnormal; Notable for the following:    Potassium 2.8 (*)     BUN 4 (*)     Albumin 3.4 (*)     AST 55 (*)     GFR calc non Af Amer 75 (*)     GFR calc Af Amer 87 (*)     All other components within normal limits  URINALYSIS, MICROSCOPIC ONLY - Abnormal; Notable for the following:    Hgb urine dipstick LARGE (*)     Leukocytes, UA TRACE (*)     All other components within normal limits  PROTIME-INR - Abnormal; Notable for the following:    Prothrombin Time 29.5 (*)     INR 2.99 (*)     All other components within normal limits  CBC WITH DIFFERENTIAL  LIPASE, BLOOD  POCT I-STAT TROPONIN I   Ct Abdomen Pelvis W Contrast  07/07/2012  *RADIOLOGY REPORT*  Clinical Data: Right lower quadrant abdominal pain, nausea and vomiting.  CT ABDOMEN AND PELVIS WITH CONTRAST  Technique:  Multidetector CT imaging of the abdomen and pelvis was performed following the standard  protocol during bolus administration of intravenous contrast.  Contrast: OMNIPAQUE IOHEXOL 300 MG/ML  SOLN  Comparison: Lumbar spine CT performed 06/26/2010, and CT of the abdomen performed 05/28/2007  Findings: Mild bibasilar atelectasis is noted.  There is persistent dilatation of the intrahepatic biliary ducts and common hepatic duct; the common hepatic duct measures approximately 1.9 cm in diameter.  Minimal apparent surrounding stranding is thought to reflect the pancreatic head, on correlation with the prior study.  This is thought to reflect the patient's prior cholecystectomy; clips are noted along the gallbladder fossa.  Mild scarring is noted along the right kidney, and there is a small 1.2 cm cyst at the lower pole of the right kidney.  The kidneys are otherwise unremarkable in appearance.  There is no evidence of hydronephrosis.  No renal or ureteral stones are seen.  No perinephric stranding is appreciated.  No free fluid is identified.  The small bowel is unremarkable in appearance.  The stomach is within normal limits.  No acute vascular abnormalities are seen.  The appendix is not definitely seen; there is no evidence for appendicitis.  Scattered diverticulosis is noted along the proximal sigmoid colon; the colon is otherwise unremarkable in appearance.  The bladder is moderately distended and grossly unremarkable. Minimal calcification and nodularity of the uterus appears to reflect a 2.8 cm fibroid.  The ovaries are relatively symmetric; no suspicious adnexal masses are seen.  Enlarged bilateral inguinal nodes are seen, measuring up to 1.9 cm in short axis; these are of uncertain significance.  Smaller nodes are seen adjacent to proximal inguinal canals.  Focal thickening of the right rectus abdominus muscle is noted at the right mid abdomen, with increased attenuation, compatible with hematoma.  This is difficult to fully distinguish from the surrounding musculature, but given differences in  thickness from the left side, the hematoma measures approximately 2.4 cm in thickness.  No acute osseous abnormalities are identified.  The patient's right hip arthroplasty is incompletely imaged but appears grossly unremarkable.  There is grade 1 anterolisthesis of L4 on L5, reflecting underlying facet disease.  Vacuum phenomenon is noted at L5-S1.  IMPRESSION:  1.  Focal thickening of the right rectus abdominus muscle at the right mid abdomen, with increased attenuation, compatible with soft tissue hematoma.  Given differences in thickness from the left side, the hematoma measures approximately 2.4 cm in thickness, though it is difficult to fully characterize. 2.  Enlarged bilateral inguinal nodes, measuring up to 1.9 cm in short axis; these are of uncertain significance. 3.  Stable dilatation of the biliary tree is thought to reflect prior cholecystectomy; no evidence of distal obstruction. 4.  Mild scarring along the right kidney, and small right renal cyst. 5.  Scattered diverticulosis along the proximal sigmoid colon, without evidence of diverticulitis. 6.  Likely fibroid noted within the uterus.   Original Report Authenticated By: Tonia Ghent, M.D.      1. Hypokalemia   2. Warfarin anticoagulation   3. Nontraumatic rectus hematoma   4. Renal cyst   5. Uterine fibroid   6. Inguinal lymphadenopathy   7. Nausea & vomiting   8. Diarrhea       MDM  Hassell Halim presents with abdominal pain, nausea vomiting.  Right lower quadrant pain concerning for appendicitis however patient does not have rebound or peritoneal signs. No surgical abdomen on exam.  Patient without leukocytosis or anemia.  CMP with hypokalemia replaced here in the department.    Patient is nontoxic, nonseptic appearing, in no apparent distress.  Patient's pain and other symptoms adequately managed in emergency department.  Fluid bolus given.  Labs, imaging and vitals reviewed.  CT with abdominal hematoma located in the  right rectus abdominis muscle.  Patient does not meet the SIRS or Sepsis criteria.  On repeat exam patient does not have a surgical abdomin and there are nor peritoneal signs.  No indication of appendicitis, bowel obstruction, bowel perforation, cholecystitis, diverticulitis.    This patient takes Coumadin 2/2 a Hx of PE.  Dr. Myrtis Ser with [facility] cardiology manages this for her.  As such with the on-call physician for [facility] cardiology, reviewed her Coumadin dosage and followed his recommendations of decreasing her Coumadin dose to 5 mg per day.  Discussed with her only the need to followup with Dr. Myrtis Ser for INR checks and Coumadin dosing.  Patient encouraged to take her home methadone prescription for pain management and given strict instructions for follow-up with their primary care physician.  I have also discussed reasons to return immediately to the ER.  Patient expresses understanding and agrees with plan.  1. Medications: usual home medications 2. Treatment: rest, drink plenty of fluids, decrease Coumadin dosing to 5 mg per day 3. Follow Up: Please followup with your primary doctor for discussion of your diagnoses and further evaluation after today's visit; followup with Dr. Myrtis Ser in regards to your Coumadin dosing and INR checks.          Dahlia Client Grantley Savage, PA-C 07/07/12 2239

## 2012-07-07 NOTE — ED Notes (Signed)
Reports having n/v on tues and wed. Woke up this am with severe RLQ pain. Denies diarrhea.

## 2012-07-08 NOTE — ED Provider Notes (Signed)
Medical screening examination/treatment/procedure(s) were conducted as a shared visit with non-physician practitioner(s) and myself.  I personally evaluated the patient during the encounter 56 yo woman with Hx of pulmonary embolism, on coumadin, has been having nausea, vomiting and diarrhea for several days.  Now she complains of RLQ pain.  Lab tests showed a therapeutic INR, and CT of the abdomen/pelvis showed a hematoma in the right rectus abdominis muscle.  Call to [facility] Cardiology --> advised lower dose of coumadin while waiting for her hematoma to resolve.   Carleene Cooper III, MD 07/08/12 1311

## 2012-07-09 ENCOUNTER — Telehealth: Payer: Self-pay | Admitting: Pulmonary Disease

## 2012-07-09 ENCOUNTER — Telehealth: Payer: Self-pay | Admitting: Cardiology

## 2012-07-09 NOTE — Telephone Encounter (Signed)
Spoke with patient on phone, veirfied her coumadin dose was changed by ED MD to 5 mg daily, this is a dose change from 55 mg/week to 35 mg/week, see ED note, made her appt.  for 07/11/2012.

## 2012-07-09 NOTE — Telephone Encounter (Signed)
Pt states she cannot come in anytime soon, but wanted to get appt set. Pt scheduled appt for 09-04-11 at 3:30. Pt was seen in ER for abdominal pain.  Carron Curie, CMA

## 2012-07-09 NOTE — Telephone Encounter (Signed)
New Problem:    Patient was in the ED this past weekend because she was bleeding in her abdomen and they though it was due to her coumadin, and, was advised to call in to see if Dr. Myrtis Ser wanted to have her come in to be evaluated or at least have an INR check.  Please call back.

## 2012-07-09 NOTE — Telephone Encounter (Signed)
lmomtcb x1  Last OV 04/22/11 Told to f/u in 4 months

## 2012-07-11 ENCOUNTER — Telehealth: Payer: Self-pay | Admitting: Gastroenterology

## 2012-07-11 ENCOUNTER — Ambulatory Visit (INDEPENDENT_AMBULATORY_CARE_PROVIDER_SITE_OTHER): Payer: Medicare Other | Admitting: *Deleted

## 2012-07-11 DIAGNOSIS — I2699 Other pulmonary embolism without acute cor pulmonale: Secondary | ICD-10-CM

## 2012-07-11 LAB — POCT INR: INR: 2.5

## 2012-07-11 NOTE — Telephone Encounter (Signed)
Left message for pt to call back  °

## 2012-07-11 NOTE — Telephone Encounter (Signed)
[name] from [facility] cardiology called to scheduled pt an appt. Pt was seen in the er for nausea and vomiting and abdominal pain. Pts PCP would not see her until she was seen by GI. Pt scheduled to see Carmen Cluster NP tomorrow at 2pm. Kennon Rounds to notify pt of appt date and time.

## 2012-07-12 ENCOUNTER — Ambulatory Visit (INDEPENDENT_AMBULATORY_CARE_PROVIDER_SITE_OTHER): Payer: Medicare Other | Admitting: Nurse Practitioner

## 2012-07-12 ENCOUNTER — Encounter: Payer: Self-pay | Admitting: *Deleted

## 2012-07-12 VITALS — BP 92/64 | HR 76 | Ht 65.5 in | Wt 285.4 lb

## 2012-07-12 DIAGNOSIS — S301XXA Contusion of abdominal wall, initial encounter: Secondary | ICD-10-CM

## 2012-07-12 DIAGNOSIS — K5900 Constipation, unspecified: Secondary | ICD-10-CM | POA: Diagnosis not present

## 2012-07-12 MED ORDER — LINACLOTIDE 145 MCG PO CAPS: 145.0000 ug | ORAL_CAPSULE | Freq: Every day | ORAL | Status: DC

## 2012-07-12 NOTE — Patient Instructions (Signed)
Call Dr. Beverely Low regarding an appointment. Call us if recurrent Nausea & Vomiting.

## 2012-07-12 NOTE — Progress Notes (Signed)
07/12/2012 [name] [id number] 1955/11/22   History of Present Illness:  Patient is a 56 -year-old female with multiple medical problems. She is on chronic Coumadin for history of pulmonary embolisms. Patient is known to Dr. Arlyce Dice for history of chronic constipaiton. She has a history of chronic biliary duct dilation monitored by Dr.Kaplan as well. Patient was last seen in the office October 2012.  Patient presents today after being referred by the coumadin clinic. She was recently seen in the emergency department for nausea, vomiting and right lower quadrant pain. Several days ago patient woke up during the night with nausea and vomiting. She continued to vomit throughout the night into the next day. Emesis was non-bloody (old food). Vomiting was followed by onset of RLQ pain. Patient thought she had appenditicis, went to ED for evaluation. Her white count was normal, hemoglobin 12.5, potassium low at 2.8. CT scan of the abdomen and pelvis revealed a right rectus abdominus muscle at the right mid abdomen, with increased attenuation, compatible with soft tissue hematoma her Coumadin dose was decreased in the ED to 5 mg daily, her cardiologist, Dr. Myrtis Ser was notified.    Current Medications, Allergies, Past Medical History, Past Surgical History, Family History and Social History were reviewed in [employer] record.   Physical Exam: General: pleasant obese white female in obvious physical discomfort.  Head: Normocephalic and atraumatic Eyes:  sclerae anicteric, conjunctiva pink  Ears: Normal auditory acuity Lungs: Clear throughout to auscultation Heart: Regular rate and rhythm Abdomen: Soft, obese with signifcant tenderness over right mid abdomen and RLQ.  Musculoskeletal: Symmetrical with no gross deformities  Extremities: No edema  Neurological: Alert oriented x 4, grossly nonfocal Psychological:  Alert and cooperative. Normal mood and affect  Assessment and  Recommendations:  43. 56 year old female with multiple medical problems, on chronic Coumadin. Now with right rectus abdominus muscle hematoma by CT scan 07/07/12.  Patient woke up with nausea and vomiting last week, probably viral illness, which has since resolved . Suspect hematoma sustained sometime during patient's several episodes of nausea and vomiting. Except for chronic constipation ,no active GI problems . No evidence for gastrointestinal bleeding.  Explained to patient and daughter that rectus sheath hematomas are usually self-limiting but if no improvement, or worsening pain over next few days repeat imaging may be necessary, especially since she is sill on coumadin.   2. Chronic constipation, likely secondary to chronic pain medications. Trial of daily Linzess, sample given.  3. Hypokalemia, repleted in ED.

## 2012-07-13 DIAGNOSIS — S301XXA Contusion of abdominal wall, initial encounter: Secondary | ICD-10-CM

## 2012-07-13 HISTORY — DX: Contusion of abdominal wall, initial encounter: S30.1XXA

## 2012-07-13 NOTE — Progress Notes (Signed)
Reviewed and agree with management. Robert D. Kaplan, M.D., FACG  

## 2012-07-16 ENCOUNTER — Telehealth: Payer: Self-pay | Admitting: Nurse Practitioner

## 2012-07-16 NOTE — Telephone Encounter (Signed)
Spoke to the pt and she said she has been taking the Linzess 145 mcg since Fri and has not had any bowel movement.  It has been about 6 days since she had one.  I put her on hold and spoke to Box.  Gunnar Fusi told me to advise the patient she can take the Milk of Magnesia once daily with 1 linzess and Miralax every 8 hours until she has a good BM.  The patient understood.  I asked her to call me Wed the 11th with a progress report.

## 2012-07-17 ENCOUNTER — Telehealth: Payer: Self-pay | Admitting: *Deleted

## 2012-07-17 NOTE — Telephone Encounter (Signed)
Message copied by Konrad Hoak, Franchot Mimes on Tue Jul 17, 2012  2:10 PM ------      Message from: Myrtis Ser, [place] D      Created: Mon Jul 16, 2012 10:26 AM      Regarding: RE: Coumadin dose was decreased at ER       Patient needs to be brought into the office to see Tereso Newcomer or Norma Fredrickson. They can help determine if we are sure that any rectus sheath bleeding has stopped and that the hemoglobin is stable. Plan to continue new regular Coumadin in the meantime.      ----- Message -----         From: Raul Del, RN         Sent: 07/11/2012   2:53 PM           To: Luis Abed, MD      Subject: Coumadin dose was decreased at ER                        Pt went to ER on 07/07/12 for sever Abd pain after having several days of N&V. Dx with abdominal hematoma located in the right rectus abdominis muscle. Coumadin dose was decreased to 5mg s daily at D/C from ER. INR on arrival was 2.99. Please advise, can pt return to normal coumadin dose. She has an appt tomorrow(07/12/12) to see a  N.P. at GI.

## 2012-07-17 NOTE — Telephone Encounter (Signed)
Pt has appt tomorrow to see PA and is aware to continue current coumadin dose until tomorrow.

## 2012-07-18 ENCOUNTER — Encounter: Payer: Self-pay | Admitting: Physician Assistant

## 2012-07-18 ENCOUNTER — Ambulatory Visit (INDEPENDENT_AMBULATORY_CARE_PROVIDER_SITE_OTHER): Payer: Medicare Other | Admitting: *Deleted

## 2012-07-18 ENCOUNTER — Ambulatory Visit (INDEPENDENT_AMBULATORY_CARE_PROVIDER_SITE_OTHER): Payer: Medicare Other | Admitting: Physician Assistant

## 2012-07-18 VITALS — BP 96/70 | HR 68 | Ht 67.5 in | Wt 288.0 lb

## 2012-07-18 DIAGNOSIS — I2699 Other pulmonary embolism without acute cor pulmonale: Secondary | ICD-10-CM

## 2012-07-18 DIAGNOSIS — R0602 Shortness of breath: Secondary | ICD-10-CM

## 2012-07-18 DIAGNOSIS — S301XXA Contusion of abdominal wall, initial encounter: Secondary | ICD-10-CM

## 2012-07-18 LAB — POCT INR: INR: 1.7

## 2012-07-18 NOTE — Patient Instructions (Addendum)
PLEASE FOLLOW UP WITH [facility] 07/19/12 @ 11:10 AM WITH DR. KATZ IN THE OFFICE  LABS TODAY; BMET, CBC W/DIFF, BNP

## 2012-07-18 NOTE — Progress Notes (Signed)
[address] [place], [place]  [address] Phone: [phone], Fax:  [phone]  Date:  07/18/2012   Name:  [name]   DOB:  12/02/1955   MRN:  [id number]  PCP:  Neena Rhymes, MD  Primary Cardiologist:  Dr. Zackery Barefoot  Primary Electrophysiologist:  None    History of Present Illness: [name] is a 56 y.o. female who returns for follow up on rectus sheath hematoma.  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 LHC 05/2007: Minimal plaque in the mid RCA, otherwise no significant CAD, EF 65-70%.  Echo 10/2008: EF 50%, normal wall motion, mild RVE, mildly reduced RV systolic function, mild RAE.   Patient was recently seen in the ED with complaints of RLQ pain in the setting of persistent nausea, vomiting and diarrhea. This was likely related to viral gastroenteritis. Patient did have a CT scan of the abdomen performed that demonstrated focal thickening of the right rectus abdominous muscle at the right mid abdomen with increased attenuation compatible with soft tissue hematoma. Hematoma measured 2.4 cm. Case was discussed by the ER physician with with the on-call cardiologist. Recommendation was to reduce Coumadin dose and follow up today. She was seen by gastroenterology 07/13/12. According to the notes, her nausea and vomiting had subsided. She has difficulty with chronic constipation and she was placed on Linaclotide to see if this would help.  Pain in abdomen is better.  However, it is still significant.  Nausea and vomiting resolved.  Still constipated.  She notes worsening DOE.  No orthopnea, PND.  LE edema is chronic.  She sees [facility] for venous stasis ulcers with occasional UNNA boot placement.  No syncope.  No near syncope.  No chest pain.    Labs (11/13):   K 2.8, creatinine 0.84, AST 21, Hgb 12.5   Wt Readings from Last 3  Encounters:  07/12/12 285 lb 6 oz (129.445 kg)  04/19/12 275 lb (124.739 kg)  04/12/12 275 lb (124.739 kg)     Past Medical History  Diagnosis Date  . Other pulmonary embolism and infarction     Significant 2008 and coumadin therapy RV dysfunction...echo...2008..EF 50%..right ventricle markedly dilated w marked right ventricular dysfunc and moder tricuspid regurg/echo..March 2010, Ef 50%, mild dilation of right ventricule w mild decrease right ventric function   . Hypopotassemia   . Tobacco use disorder   . Anemia, unspecified   . Hypotension, unspecified   . Chronic low back pain   . Coronary atherosclerosis of unspecified type of vessel, native or graft     minimal catheterization, October 2008  . Chest pain     with stress  . Constipation     and diarrhea chronic..Dr. Barnet Pall  . Right bundle branch block     intermittent  . Leg pain   . Back pain   . Methadone adverse reaction     for chronic leg and back pain  . Homocystinemia     signif elevation in the past...plan folic acid, B6, B12  . Lymphoproliferative disease     disorder in the past??  . Cellulitis   . Thyroid disease   . Peripheral vascular disease   . CAD (coronary artery disease)   . Venous insufficiency (chronic) (peripheral)     Patient's legs were wrapped 2013  . Diverticulosis of colon (without mention of hemorrhage)   . Benign neoplasm  of colon 12/2007    Hyperplastic colon polyps    Current Outpatient Prescriptions  Medication Sig Dispense Refill  . aspirin 81 MG tablet Take 81 mg by mouth daily.        Marland Kitchen b complex vitamins capsule Take 1 capsule by mouth daily.      . Calcium Carbonate Antacid (ROLAIDS EXTRA STRENGTH) 1177 MG CHEW Chew 1 tablet by mouth as needed. For vomiting      . Cyanocobalamin (B-12) 1500 MCG TBCR Take 1 tablet by mouth daily.       Marland Kitchen HYDROmorphone (DILAUDID) 2 MG tablet Take 4 mg by mouth every 6 (six) hours as needed. For breakthrough pain      . Linaclotide (LINZESS)  145 MCG CAPS Take 1 capsule (145 mcg total) by mouth daily.  20 capsule  0  . magnesium hydroxide (MILK OF MAGNESIA) 400 MG/5ML suspension Take 30 mLs by mouth as needed. For nausea      . methadone (DOLOPHINE) 10 MG tablet Take 20 mg by mouth 2 (two) times daily.       . vitamin C (ASCORBIC ACID) 500 MG tablet Take 500 mg by mouth daily.        Marland Kitchen warfarin (COUMADIN) 5 MG tablet Take 5-10 mg by mouth daily.         Allergies: Allergies  Allergen Reactions  . Morphine     REACTION: nausea, rash, itching, vomiting  . Nsaids     Bowel irregularity  . Venlafaxine Other (See Comments)    Reaction unknown    Social History:  The patient  reports that she quit smoking about 13 months ago. Her smoking use included Cigarettes. She has never used smokeless tobacco. She reports that she does not drink alcohol or use illicit drugs.   ROS:  Please see the history of present illness.   All other systems reviewed and negative.   PHYSICAL EXAM: VS:  BP 96/70  Pulse 68  Ht 5' 7.5" (1.715 m)  Wt 288 lb (130.636 kg)  BMI 44.44 kg/m2 Well nourished, well developed, in no acute distress HEENT: normal Neck: no JVD Cardiac:  normal S1, S2; RRR; no murmur Lungs:  clear to auscultation bilaterally, no wheezing, rhonchi or rales Abd: soft, mild to moderate area of ecchymosis x 2 in RLQ in linear distribution  Ext: 1+ bilateral LE edema with chronic stasis changes Skin: warm and dry Neuro:  CNs 2-12 intact, no focal abnormalities noted  EKG:  NSR, HR 68, rightward axis, no significant changes  ASSESSMENT AND PLAN:  1. Rectus Sheath Hematoma:   Exam is difficult due to her body habitus.  She does not remember seeing the ecchymoses.  Pain is better.  INR today is 1.7.  I discussed her case by phone with Dr. Zackery Barefoot.  We will check a CBC today.  I will see her back tomorrow to recheck her.  If Hgb is down or her abdomen looks worse, will plan on repeat CT.  2. Dyspnea:   She reports significant  weight gain.  She does not appear to be volume overloaded.  She has chronic leg swelling.  I do not think she has ever had CHF.  Will check BMET and BNP.  She is not tachycardic.  She has no chest pain.  She has not passed out.  I do not think she needs a CT of her chest.  We will adjust her coumadin today to get her INR above 2.  3. Pulmonary  Emboli:  Coumadin clinic will adjust her coumadin.  We will need to watch this closely.  4. Disposition:  Follow up with me tomorrow.  Signed, Tereso Newcomer, PA-C  3:30 PM 07/18/2012

## 2012-07-19 ENCOUNTER — Encounter: Payer: Self-pay | Admitting: Physician Assistant

## 2012-07-19 ENCOUNTER — Ambulatory Visit (INDEPENDENT_AMBULATORY_CARE_PROVIDER_SITE_OTHER): Payer: Medicare Other | Admitting: Physician Assistant

## 2012-07-19 ENCOUNTER — Telehealth: Payer: Self-pay | Admitting: Cardiology

## 2012-07-19 VITALS — BP 110/80 | HR 66 | Ht 68.0 in | Wt 286.0 lb

## 2012-07-19 DIAGNOSIS — S301XXA Contusion of abdominal wall, initial encounter: Secondary | ICD-10-CM

## 2012-07-19 LAB — BASIC METABOLIC PANEL
CO2: 32 mEq/L (ref 19–32)
Chloride: 98 mEq/L (ref 96–112)
Potassium: 4.3 mEq/L (ref 3.5–5.1)
Sodium: 135 mEq/L (ref 135–145)

## 2012-07-19 LAB — CBC WITH DIFFERENTIAL/PLATELET
Basophils Relative: 0.5 % (ref 0.0–3.0)
Eosinophils Relative: 1.9 % (ref 0.0–5.0)
HCT: 32.9 % — ABNORMAL LOW (ref 36.0–46.0)
Hemoglobin: 11.1 g/dL — ABNORMAL LOW (ref 12.0–15.0)
MCHC: 33.8 g/dL (ref 30.0–36.0)
MCV: 88.7 fl (ref 78.0–100.0)
Monocytes Absolute: 0.2 10*3/uL (ref 0.1–1.0)
Neutro Abs: 4.2 10*3/uL (ref 1.4–7.7)
Neutrophils Relative %: 73.1 % (ref 43.0–77.0)
RBC: 3.71 Mil/uL — ABNORMAL LOW (ref 3.87–5.11)
WBC: 5.7 10*3/uL (ref 4.5–10.5)

## 2012-07-19 NOTE — Patient Instructions (Signed)
Follow up with Tereso Newcomer, PA-C on Monday 07/23/2012 at 4:00 (after you see coumadin clinic)

## 2012-07-19 NOTE — Progress Notes (Signed)
[address] [place], [place]  [address] Phone: [phone], Fax:  [phone]  Date:  07/19/2012   Name:  [name]   DOB:  May 06, 1956   MRN:  [id number]  PCP:  Neena Rhymes, MD  Primary Cardiologist:  Dr. Zackery Barefoot  Primary Electrophysiologist:  None    History of Present Illness: [name] is a 56 y.o. female who returns for follow up on rectus sheath hematoma.  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 LHC 05/2007: Minimal plaque in the mid RCA, otherwise no significant CAD, EF 65-70%.  Echo 10/2008: EF 50%, normal wall motion, mild RVE, mildly reduced RV systolic function, mild RAE.   Patient was recently seen in the ED with complaints of RLQ pain in the setting of persistent nausea, vomiting and diarrhea. This was likely related to viral gastroenteritis. Patient did have a CT scan of the abdomen performed that demonstrated focal thickening of the right rectus abdominous muscle at the right mid abdomen with increased attenuation compatible with soft tissue hematoma. Hematoma measured 2.4 cm. Case was discussed by the ER physician with with the on-call cardiologist. Recommendation was to reduce Coumadin dose and follow up today. She was seen by gastroenterology 07/13/12. According to the notes, her nausea and vomiting had subsided. She has difficulty with chronic constipation and she was placed on Linaclotide to see if this would help.  I saw her yesterday.  Her pain in her abdomen was better.  I checked a CBC.  Her Coumadin was adjusted for low INR.  Today, she is doing about the same.  Pain continues to improve.  Breathing is stable.  Of note, her Hgb is stable when compared to her baseline.   Labs (10/12):   Hgb 11.2 Labs (11/13):   K 2.8, creatinine 0.84, AST 21, Hgb 12.5  Labs (12/13):   Hgb 11.1, K 4.3, creatinine  0.9  Wt Readings from Last 3 Encounters:  07/18/12 288 lb (130.636 kg)  07/12/12 285 lb 6 oz (129.445 kg)  04/19/12 275 lb (124.739 kg)     Past Medical History  Diagnosis Date  . Other pulmonary embolism and infarction     Significant 2008 and coumadin therapy RV dysfunction...echo...2008..EF 50%..right ventricle markedly dilated w marked right ventricular dysfunc and moder tricuspid regurg/echo..March 2010, Ef 50%, mild dilation of right ventricule w mild decrease right ventric function   . Hypopotassemia   . Tobacco use disorder   . Anemia, unspecified   . Hypotension, unspecified   . Chronic low back pain   . Coronary atherosclerosis of unspecified type of vessel, native or graft     minimal catheterization, October 2008  . Chest pain     with stress  . Constipation     and diarrhea chronic..Dr. Barnet Pall  . Right bundle branch block     intermittent  . Leg pain   . Back pain   . Methadone adverse reaction     for chronic leg and back pain  . Homocystinemia     signif elevation in the past...plan folic acid, B6, B12  . Lymphoproliferative disease     disorder in the past??  . Cellulitis   . Thyroid disease   . Peripheral vascular disease   . CAD (coronary artery disease)   . Venous insufficiency (chronic) (peripheral)     Patient's legs were wrapped 2013  .  Diverticulosis of colon (without mention of hemorrhage)   . Benign neoplasm of colon 12/2007    Hyperplastic colon polyps    Current Outpatient Prescriptions  Medication Sig Dispense Refill  . aspirin 81 MG tablet Take 81 mg by mouth daily.        Marland Kitchen b complex vitamins capsule Take 1 capsule by mouth daily.      . Calcium Carbonate Antacid (ROLAIDS EXTRA STRENGTH) 1177 MG CHEW Chew 1 tablet by mouth as needed. For vomiting      . Cyanocobalamin (B-12) 1500 MCG TBCR Take 1 tablet by mouth daily.       Marland Kitchen HYDROmorphone (DILAUDID) 2 MG tablet Take 4 mg by mouth every 6 (six) hours as needed. For breakthrough pain       . Linaclotide (LINZESS) 145 MCG CAPS Take 1 capsule (145 mcg total) by mouth daily.  20 capsule  0  . magnesium hydroxide (MILK OF MAGNESIA) 400 MG/5ML suspension Take 30 mLs by mouth as needed. For nausea      . methadone (DOLOPHINE) 10 MG tablet Take 20 mg by mouth 2 (two) times daily.       . vitamin C (ASCORBIC ACID) 500 MG tablet Take 500 mg by mouth daily.        Marland Kitchen warfarin (COUMADIN) 5 MG tablet Take 5-10 mg by mouth daily.         Allergies: Allergies  Allergen Reactions  . Morphine     REACTION: nausea, rash, itching, vomiting  . Nsaids     Bowel irregularity  . Venlafaxine Other (See Comments)    Reaction unknown    PHYSICAL EXAM: VS:  BP 110/80  Pulse 66  Ht 5\' 8"  (1.727 m)  Wt 286 lb (129.729 kg)  BMI 43.49 kg/m2  SpO2 94% Well nourished, well developed, in no acute distress HEENT: normal Neck: no JVD Cardiac:  normal S1, S2; RRR; no murmur Lungs:  clear to auscultation bilaterally, no wheezing, rhonchi or rales Abd: soft, mild to moderate area of ecchymosis x 2 in RLQ in linear distribution (no change from yesterday) Ext: 1+ bilateral LE edema with chronic stasis changes Skin: warm and dry Neuro:  CNs 2-12 intact, no focal abnormalities noted  ASSESSMENT AND PLAN:  1. Rectus Sheath Hematoma:   Stable.  Continue with current treatment plan.  Plan follow up again on Monday 12/16.  Will repeat Hgb then as well.  2. Disposition:  Follow up with me 12/16.  Luna Glasgow, PA-C  11:36 AM 07/19/2012

## 2012-07-20 ENCOUNTER — Telehealth: Payer: Self-pay | Admitting: *Deleted

## 2012-07-20 NOTE — Telephone Encounter (Signed)
Patient checked  this week in clinic, INR < 2.0. She describes nose bleed last night that did resolve easily, this morning she had some bright red bleeding from her rectum, she states she has a hx of constipation, I instructed her to observe for bleeding from rectum either dark and tary or bright red to notify her primary care physician.

## 2012-07-23 ENCOUNTER — Ambulatory Visit: Payer: Medicare Other | Admitting: Physician Assistant

## 2012-09-03 ENCOUNTER — Ambulatory Visit: Payer: Medicare Other | Admitting: Pulmonary Disease

## 2012-09-28 ENCOUNTER — Ambulatory Visit (INDEPENDENT_AMBULATORY_CARE_PROVIDER_SITE_OTHER): Payer: Medicare Other | Admitting: *Deleted

## 2012-09-28 DIAGNOSIS — I2699 Other pulmonary embolism without acute cor pulmonale: Secondary | ICD-10-CM | POA: Diagnosis not present

## 2012-09-28 LAB — POCT INR: INR: 1.2

## 2012-10-05 ENCOUNTER — Ambulatory Visit (INDEPENDENT_AMBULATORY_CARE_PROVIDER_SITE_OTHER): Payer: Medicare Other | Admitting: *Deleted

## 2012-10-05 DIAGNOSIS — I2699 Other pulmonary embolism without acute cor pulmonale: Secondary | ICD-10-CM

## 2012-10-08 ENCOUNTER — Ambulatory Visit: Payer: Medicare Other | Admitting: Physician Assistant

## 2012-10-11 ENCOUNTER — Ambulatory Visit (INDEPENDENT_AMBULATORY_CARE_PROVIDER_SITE_OTHER): Payer: Medicare Other | Admitting: Physician Assistant

## 2012-10-11 ENCOUNTER — Encounter: Payer: Self-pay | Admitting: Physician Assistant

## 2012-10-11 VITALS — BP 104/78 | HR 53 | Ht 68.0 in | Wt 292.8 lb

## 2012-10-11 DIAGNOSIS — Z86711 Personal history of pulmonary embolism: Secondary | ICD-10-CM

## 2012-10-11 DIAGNOSIS — R609 Edema, unspecified: Secondary | ICD-10-CM | POA: Diagnosis not present

## 2012-10-11 DIAGNOSIS — I2510 Atherosclerotic heart disease of native coronary artery without angina pectoris: Secondary | ICD-10-CM

## 2012-10-11 NOTE — Patient Instructions (Addendum)
Your physician wants you to follow-up in: with Dr Myrtis Ser in a year.   You will receive a reminder letter in the mail two months in advance. If you don't receive a letter, please call our office to schedule the follow-up appointment.

## 2012-10-11 NOTE — Progress Notes (Signed)
[address], [address] [place], [place]  [address] Phone: [phone], Fax:  [phone]  Date:  10/11/2012   ID:  [name], [name] 09-05-1955, MRN [id number]  PCP:  Neena Rhymes, MD  Primary Cardiologist:  Dr. Zackery Barefoot     History of Present Illness: [name] is a 57 y.o. female who returns for follow up.  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 LHC 05/2007: Minimal plaque in the mid RCA, otherwise no significant CAD, EF 65-70%. Echo 10/2008: EF 50%, normal wall motion, mild RVE, mildly reduced RV systolic function, mild RAE.  She was last seen here after a CT scan demonstrated a rectus sheath hematoma.  Coumadin was adjusted briefly.  She is doing well today.  The patient denies chest pain, shortness of breath, syncope, orthopnea, PND or significant pedal edema.  Notes increased dyspnea associated with constipation that resolves with bowel evacuation.  Needs her ECG sent to her pain specialist to follow QTc.  Labs (10/12): Hgb 11.2  Labs (11/13): K 2.8, creatinine 0.84, AST 21, Hgb 12.5  Labs (12/13): Hgb 11.1, K 4.3, creatinine 0.9   Wt Readings from Last 3 Encounters:  10/11/12 292 lb 12.8 oz (132.813 kg)  07/19/12 286 lb (129.729 kg)  07/18/12 288 lb (130.636 kg)     Past Medical History  Diagnosis Date  . Other pulmonary embolism and infarction     Significant 2008 and coumadin therapy RV dysfunction...echo...2008..EF 50%..right ventricle markedly dilated w marked right ventricular dysfunc and moder tricuspid regurg/echo..March 2010, Ef 50%, mild dilation of right ventricule w mild decrease right ventric function   . Hypopotassemia   . Tobacco use disorder   . Anemia, unspecified   . Hypotension, unspecified   . Chronic low back pain   . Coronary atherosclerosis of unspecified type of vessel, native or graft     minimal  catheterization, October 2008  . Chest pain     with stress  . Constipation     and diarrhea chronic..Dr. Barnet Pall  . Right bundle branch block     intermittent  . Leg pain   . Back pain   . Methadone adverse reaction     for chronic leg and back pain  . Homocystinemia     signif elevation in the past...plan folic acid, B6, B12  . Lymphoproliferative disease     disorder in the past??  . Cellulitis   . Thyroid disease   . Peripheral vascular disease   . CAD (coronary artery disease)   . Venous insufficiency (chronic) (peripheral)     Patient's legs were wrapped 2013  . Diverticulosis of colon (without mention of hemorrhage)   . Benign neoplasm of colon 12/2007    Hyperplastic colon polyps    Current Outpatient Prescriptions  Medication Sig Dispense Refill  . aspirin 81 MG tablet Take 81 mg by mouth daily.        . Calcium Carbonate Antacid (ROLAIDS EXTRA STRENGTH) 1177 MG CHEW Chew 1 tablet by mouth as needed. For vomiting      . Cyanocobalamin (B-12) 1500 MCG TBCR Take 1 tablet by mouth daily.       Marland Kitchen HYDROmorphone (DILAUDID) 2 MG tablet Take 4 mg by mouth every 6 (six) hours as needed. For breakthrough pain      . magnesium hydroxide (MILK OF MAGNESIA) 400 MG/5ML suspension Take 30 mLs by mouth  as needed. For nausea      . methadone (DOLOPHINE) 10 MG tablet Take 20 mg by mouth 3 (three) times daily.       . vitamin C (ASCORBIC ACID) 500 MG tablet Take 500 mg by mouth daily.        Marland Kitchen warfarin (COUMADIN) 5 MG tablet Take 5-10 mg by mouth daily.       Marland Kitchen b complex vitamins capsule Take 1 capsule by mouth daily.       No current facility-administered medications for this visit.    Allergies:    Allergies  Allergen Reactions  . Morphine     REACTION: nausea, rash, itching, vomiting  . Nsaids     Bowel irregularity  . Venlafaxine Other (See Comments)    Reaction unknown    Social History:  The patient  reports that she quit smoking about 16 months ago. Her smoking use  included Cigarettes. She smoked 0.00 packs per day. She has never used smokeless tobacco. She reports that she does not drink alcohol or use illicit drugs.   ROS:  Please see the history of present illness.      All other systems reviewed and negative.   PHYSICAL EXAM: VS:  BP 104/78  Pulse 53  Ht 5\' 8"  (1.727 m)  Wt 292 lb 12.8 oz (132.813 kg)  BMI 44.53 kg/m2 Well nourished, well developed, in no acute distress HEENT: normal Neck: no JVD Cardiac:  normal S1, S2; RRR; no murmur Lungs:  clear to auscultation bilaterally, no wheezing, rhonchi or rales Abd: soft, nontender, no hepatomegaly Ext: no edema Skin: warm and dry Neuro:  CNs 2-12 intact, no focal abnormalities noted  EKG:  Sinus brady, HR 51, NSSTTW changes, QTc 457 ms     ASSESSMENT AND PLAN:  1. Hx of Pulmonary Emboli:  Continue coumadin. 2. Right Heart Failure:  Volume status stable.  Edema controlled.  No change in Rx. 3. Non-Obstructive CAD:  No angina.  Continue current Rx. 4. Disposition:  Follow up with Dr. Zackery Barefoot in 1 year.   Luna Glasgow, PA-C  4:09 PM 10/11/2012

## 2012-10-16 ENCOUNTER — Ambulatory Visit (INDEPENDENT_AMBULATORY_CARE_PROVIDER_SITE_OTHER): Payer: Medicare Other

## 2012-10-16 DIAGNOSIS — I2699 Other pulmonary embolism without acute cor pulmonale: Secondary | ICD-10-CM | POA: Diagnosis not present

## 2012-10-18 DIAGNOSIS — Z1231 Encounter for screening mammogram for malignant neoplasm of breast: Secondary | ICD-10-CM | POA: Diagnosis not present

## 2012-10-18 DIAGNOSIS — Z124 Encounter for screening for malignant neoplasm of cervix: Secondary | ICD-10-CM | POA: Diagnosis not present

## 2012-10-18 DIAGNOSIS — N760 Acute vaginitis: Secondary | ICD-10-CM | POA: Diagnosis not present

## 2012-10-30 ENCOUNTER — Ambulatory Visit (INDEPENDENT_AMBULATORY_CARE_PROVIDER_SITE_OTHER): Payer: Medicare Other | Admitting: *Deleted

## 2012-10-30 DIAGNOSIS — I2699 Other pulmonary embolism without acute cor pulmonale: Secondary | ICD-10-CM | POA: Diagnosis not present

## 2012-10-30 LAB — POCT INR: INR: 1.7

## 2012-11-01 ENCOUNTER — Encounter: Payer: Self-pay | Admitting: Family Medicine

## 2012-11-01 ENCOUNTER — Other Ambulatory Visit (INDEPENDENT_AMBULATORY_CARE_PROVIDER_SITE_OTHER): Payer: Medicare Other

## 2012-11-01 ENCOUNTER — Ambulatory Visit (INDEPENDENT_AMBULATORY_CARE_PROVIDER_SITE_OTHER): Payer: Medicare Other | Admitting: Family Medicine

## 2012-11-01 VITALS — BP 104/70 | HR 64 | Temp 97.7°F | Ht 68.0 in | Wt 289.6 lb

## 2012-11-01 DIAGNOSIS — I739 Peripheral vascular disease, unspecified: Secondary | ICD-10-CM

## 2012-11-01 DIAGNOSIS — L98499 Non-pressure chronic ulcer of skin of other sites with unspecified severity: Secondary | ICD-10-CM | POA: Diagnosis not present

## 2012-11-01 DIAGNOSIS — E039 Hypothyroidism, unspecified: Secondary | ICD-10-CM

## 2012-11-01 DIAGNOSIS — Z96649 Presence of unspecified artificial hip joint: Secondary | ICD-10-CM

## 2012-11-01 LAB — CBC WITH DIFFERENTIAL/PLATELET
Basophils Relative: 0.2 % (ref 0.0–3.0)
Eosinophils Absolute: 0.1 10*3/uL (ref 0.0–0.7)
Eosinophils Relative: 2.5 % (ref 0.0–5.0)
Lymphocytes Relative: 28.9 % (ref 12.0–46.0)
Neutrophils Relative %: 61.7 % (ref 43.0–77.0)
RBC: 4.26 Mil/uL (ref 3.87–5.11)
WBC: 4.6 10*3/uL (ref 4.5–10.5)

## 2012-11-01 LAB — BASIC METABOLIC PANEL
BUN: 12 mg/dL (ref 6–23)
Chloride: 97 mEq/L (ref 96–112)
Glucose, Bld: 81 mg/dL (ref 70–99)
Potassium: 4.1 mEq/L (ref 3.5–5.1)

## 2012-11-01 LAB — HEPATIC FUNCTION PANEL
ALT: 22 U/L (ref 0–35)
AST: 33 U/L (ref 0–37)
Alkaline Phosphatase: 75 U/L (ref 39–117)
Total Bilirubin: 0.6 mg/dL (ref 0.3–1.2)

## 2012-11-01 LAB — LIPID PANEL
LDL Cholesterol: 127 mg/dL — ABNORMAL HIGH (ref 0–99)
Total CHOL/HDL Ratio: 5

## 2012-11-01 NOTE — Progress Notes (Signed)
  Subjective:    Patient ID: [name], female    DOB: Apr 11, 1956, 57 y.o.   MRN: [id number]  HPI R hip pain- increased pain despite hip replacement.  Pt reports 'very strange taste in mouth'.  Feels a grinding in hip.  Having difficulty regulating coumadin.  Pt has discussed sxs w/ class action lawyer and some of sxs are consistent w/ heavy metals.  Hip surgeon- Turner Daniels.  He is not interested in revising hip due to serious complications w/ 1st surgery.  Was told to have labs drawn to check for Chromium and cobalt.  Check labs for upcoming CPE   Review of Systems For ROS see HPI     Objective:   Physical Exam  Vitals reviewed. Constitutional: She is oriented to person, place, and time. She appears well-developed and well-nourished.  Pt is clearly fatigued, chasing 2 young grandchildren around exam room, diaphoretic  Neurological: She is alert and oriented to person, place, and time.  Skin: Skin is warm. There is pallor.          Assessment & Plan:

## 2012-11-01 NOTE — Patient Instructions (Addendum)
Follow up as scheduled We'll notify you of your lab results and make any changes if needed Call with any questions or concerns HANG IN THERE!!!

## 2012-11-02 NOTE — Assessment & Plan Note (Signed)
Not currently on meds.  Check labs for upcoming CPE

## 2012-11-02 NOTE — Assessment & Plan Note (Signed)
Check labs for upcoming CPE 

## 2012-11-02 NOTE — Assessment & Plan Note (Signed)
New.  Check heavy metals to determine if she does in fact have defective replacement and whether this is causing her sxs.

## 2012-11-05 ENCOUNTER — Telehealth: Payer: Self-pay

## 2012-11-05 NOTE — Telephone Encounter (Signed)
I scheduled an appt w/ this pt for 11/08/12 at 2:45pm to see CEF. She is aware of this appt/awt

## 2012-11-05 NOTE — Telephone Encounter (Signed)
Pt. Called to report having bumped left leg on the end of her bed about 1 1/2 wks. Ago.  States has an open area the size of a pin-hole on inner aspect (L) lower leg, just above the ankle, that is draining a thin light yellow drainage.  States the drainage is continuous, and has become a nuisance.  Denies any redness/inflammation, warmth, or pain @ site.  States is continuing to use her ace bandages for compression.  Encouraged to keep area clean and dry, by washing with dial soap, applying dry dressing.  Also advised to use the Ace bandages to provide compression from toes to just below the knee.  Advised will schedule a f/u appt. w/ Dr. Darrick Penna, and to call back if has any worsening of symptoms before appt. date.  Verb. Understanding.

## 2012-11-06 LAB — CHROMIUM AND COBALT, WB: Cobalt, WB: 5.1 [ppb] — ABNORMAL HIGH (ref <1.0)

## 2012-11-07 ENCOUNTER — Encounter: Payer: Self-pay | Admitting: Vascular Surgery

## 2012-11-08 ENCOUNTER — Ambulatory Visit: Payer: Medicare Other | Admitting: Vascular Surgery

## 2012-11-08 ENCOUNTER — Telehealth: Payer: Self-pay | Admitting: *Deleted

## 2012-11-08 DIAGNOSIS — E039 Hypothyroidism, unspecified: Secondary | ICD-10-CM

## 2012-11-08 DIAGNOSIS — E785 Hyperlipidemia, unspecified: Secondary | ICD-10-CM

## 2012-11-08 MED ORDER — ATORVASTATIN CALCIUM 10 MG PO TABS: 10.0000 mg | ORAL_TABLET | Freq: Every day | ORAL | Status: DC

## 2012-11-08 MED ORDER — LEVOTHYROXINE SODIUM 100 MCG PO TABS: 100.0000 ug | ORAL_TABLET | Freq: Every day | ORAL | Status: DC

## 2012-11-08 NOTE — Telephone Encounter (Signed)
Message copied by Verdie Shire on Thu Nov 08, 2012  5:47 PM ------      Message from: Sheliah Hatch      Created: Fri Nov 02, 2012  8:43 AM       Pt needs to start Synthroid daily b/c TSH is VERY high- will need f/u TSH in 1 month      LDL has also increased from previous- should focus on healthy diet.  Should also start low dose Lipitor 10mg  nightly and recheck LFTs at [facility] in 1 month (along w/ thyroid)      Waiting on cobalt and chromium ------

## 2012-11-08 NOTE — Telephone Encounter (Signed)
Do we need to recheck the lipids as well or just the LFT's?  Please advise.//AB/CMA   Spoke with the pt and informed her of recent lab results and note.  Pt understood and agreed.  New rx's sent to the pharmacy by e-script.  Pt will call back and schedule an lab appt to have lab rechecked in 1 mos.  Pt asked if we could mail her a copy of her labs.  Labs mailed.//AB/CMA

## 2012-11-09 ENCOUNTER — Ambulatory Visit (INDEPENDENT_AMBULATORY_CARE_PROVIDER_SITE_OTHER): Payer: Medicare Other | Admitting: *Deleted

## 2012-11-09 DIAGNOSIS — I2699 Other pulmonary embolism without acute cor pulmonale: Secondary | ICD-10-CM

## 2012-11-09 NOTE — Telephone Encounter (Signed)
Future labs ordered.//AB/CMA

## 2012-11-09 NOTE — Telephone Encounter (Signed)
In 1 month she needs LFTs and TSH

## 2012-11-19 ENCOUNTER — Ambulatory Visit (INDEPENDENT_AMBULATORY_CARE_PROVIDER_SITE_OTHER): Payer: Medicare Other | Admitting: *Deleted

## 2012-11-19 DIAGNOSIS — I2699 Other pulmonary embolism without acute cor pulmonale: Secondary | ICD-10-CM

## 2012-11-19 LAB — POCT INR: INR: 2.1

## 2012-11-22 ENCOUNTER — Encounter: Payer: Medicare Other | Admitting: Family Medicine

## 2012-11-26 ENCOUNTER — Telehealth: Payer: Self-pay | Admitting: Family Medicine

## 2012-11-26 NOTE — Telephone Encounter (Signed)
Spoke with the pt and she states that she was started on 2 new meds (Lipitor 10mg  and Synthroid ) and since she has been on the meds she has been so tired and sleepy.  Pt started on the med on (11-15-12) and started feeling this way (11-20-12).  Pt states she could sleep all day, and the sxs are getting worse.  Pt stated she's not sure if it's coming from the meds but she was not feeling like this before the meds.  Please advise.//AB/CMA

## 2012-11-26 NOTE — Telephone Encounter (Signed)
Patient states that at last visit she was prescribed two new medications. She now is saying that she cannot stay awake and was wanting to know if this could be because of the medication?

## 2012-11-26 NOTE — Telephone Encounter (Signed)
Discuss with patient  

## 2012-11-26 NOTE — Telephone Encounter (Signed)
Cut synthroid in half and take 1/2 tab daily

## 2012-11-28 ENCOUNTER — Telehealth: Payer: Self-pay | Admitting: Cardiology

## 2012-11-28 NOTE — Telephone Encounter (Signed)
Pt wants Dr Myrtis Ser to see her Lipid results from 3/27 that Dr. Beverely Low ordered as her LDL is higher and she was advised by Dr. Beverely Low to start taking Lipitor 10 mg qhs. She states she just wants Dr Myrtis Ser to be aware. Note forwared to Dr. Myrtis Ser.

## 2012-11-28 NOTE — Telephone Encounter (Signed)
New problem   Pt want nurse to look in epic at her cholesterol she had tested at primary office to see if she need to come see Dr Myrtis Ser early. Please call pt

## 2012-11-29 ENCOUNTER — Encounter: Payer: Self-pay | Admitting: Gastroenterology

## 2012-11-30 ENCOUNTER — Telehealth: Payer: Self-pay | Admitting: Gastroenterology

## 2012-11-30 NOTE — Telephone Encounter (Signed)
Pt states she noticed BRB when she went to the bathroom. Pt states she received a recall letter for her colon also. Pt scheduled to see Dr. Arlyce Dice 12/17/12@2 :30pm. Pt aware of appt date and time.

## 2012-12-03 ENCOUNTER — Ambulatory Visit (INDEPENDENT_AMBULATORY_CARE_PROVIDER_SITE_OTHER): Payer: Medicare Other | Admitting: *Deleted

## 2012-12-03 DIAGNOSIS — I2699 Other pulmonary embolism without acute cor pulmonale: Secondary | ICD-10-CM | POA: Diagnosis not present

## 2012-12-03 LAB — POCT INR: INR: 2.9

## 2012-12-10 DIAGNOSIS — M25559 Pain in unspecified hip: Secondary | ICD-10-CM | POA: Diagnosis not present

## 2012-12-17 ENCOUNTER — Ambulatory Visit: Payer: Medicare Other | Admitting: Gastroenterology

## 2012-12-19 ENCOUNTER — Ambulatory Visit (INDEPENDENT_AMBULATORY_CARE_PROVIDER_SITE_OTHER): Payer: Medicare Other | Admitting: Family Medicine

## 2012-12-19 ENCOUNTER — Encounter: Payer: Self-pay | Admitting: Family Medicine

## 2012-12-19 ENCOUNTER — Other Ambulatory Visit: Payer: Self-pay | Admitting: *Deleted

## 2012-12-19 VITALS — BP 92/60 | HR 58 | Temp 98.0°F | Ht 68.0 in | Wt 275.2 lb

## 2012-12-19 DIAGNOSIS — Z1331 Encounter for screening for depression: Secondary | ICD-10-CM | POA: Diagnosis not present

## 2012-12-19 DIAGNOSIS — Z0000 Encounter for general adult medical examination without abnormal findings: Secondary | ICD-10-CM

## 2012-12-19 DIAGNOSIS — E039 Hypothyroidism, unspecified: Secondary | ICD-10-CM | POA: Diagnosis not present

## 2012-12-19 MED ORDER — WARFARIN SODIUM 5 MG PO TABS: ORAL_TABLET | ORAL | Status: DC

## 2012-12-19 NOTE — Progress Notes (Signed)
  Subjective:    Patient ID: [name], female    DOB: 1956-05-24, 57 y.o.   MRN: [id number]  HPI Here today for CPE.  Risk Factors: Hyperlipidemia- chronic problem, was previously on Lipitor but stopped due to muscle aches, dizziness, visual changes.  sxs resolved once she stopped statin. Hypothyroid- new dx.  TSH was very high at last check.  Started on Synthroid .  Pt reports energy level has not improved. Physical Activity: limited due to chronic pain Fall Risk: low risk Depression: chronic problem Hearing: normal to conversational tones, whispered voice at 6 ft ADL's: independent Cognitive: normal linear thought process, memory and attention intact Home Safety:  Safe at home Height, Weight, BMI, Visual Acuity: see vitals, vision corrected to 20/20 w/ glasses Counseling:  UTD on colonoscopy, mammo, DEXA, pap. Labs Ordered: See A&P Care Plan: See A&P    Review of Systems Patient reports no vision/ hearing changes, adenopathy,fever, weight change,  persistant/recurrent hoarseness , swallowing issues, chest pain, palpitations, edema, persistant/recurrent cough, hemoptysis, dyspnea (rest/exertional/paroxysmal nocturnal), gastrointestinal bleeding (melena, rectal bleeding), abdominal pain, bowel changes, GU symptoms (dysuria, hematuria, incontinence), Gyn symptoms (abnormal  bleeding, pain),  syncope, focal weakness, memory loss, numbness & tingling, skin/hair/nail changes, abnormal bruising or bleeding, anxiety, or depression.   +GERD    Objective:   Physical Exam General Appearance:    Alert, cooperative, no distress, appears stated age  Head:    Normocephalic, without obvious abnormality, atraumatic  Eyes:    PERRL, conjunctiva/corneas clear, EOM's intact, fundi    benign, both eyes  Ears:    Normal TM's and external ear canals, both ears  Nose:   Nares normal, septum midline, mucosa normal, no drainage    or sinus tenderness  Throat:   Lips, mucosa, and tongue normal;  teeth and gums normal  Neck:   Supple, symmetrical, trachea midline, no adenopathy;    Thyroid: no enlargement/tenderness/nodules  Back:     Symmetric, no curvature, ROM normal, no CVA tenderness  Lungs:     Clear to auscultation bilaterally, respirations unlabored  Chest Wall:    No tenderness or deformity   Heart:    Regular rate and rhythm, S1 and S2 normal, no murmur, rub   or gallop  Breast Exam:    Deferred to GYN  Abdomen:     Soft, non-tender, bowel sounds active all four quadrants,    no masses, no organomegaly  Genitalia:    Deferred to GYN  Rectal:    Extremities:   Extremities normal, atraumatic, no cyanosis or edema  Pulses:   2+ and symmetric all extremities  Skin:   Chronic venous stasis changes of bilateral LEs  Lymph nodes:   Cervical, supraclavicular, and axillary nodes normal  Neurologic:   CNII-XII intact, normal strength, sensation and reflexes    throughout          Assessment & Plan:

## 2012-12-19 NOTE — Assessment & Plan Note (Signed)
Pt's PE WNL w/ exception of her chronic venous stasis changes.  Reviewed recent labs.  UTD on health maintenance.  Anticipatory guidance provided.

## 2012-12-19 NOTE — Assessment & Plan Note (Signed)
New dx.  Pt started on meds based on last labs.  TSH was very high.  Will repeat labs today to ensure TSH WNL.  Adjust meds prn.

## 2012-12-19 NOTE — Patient Instructions (Addendum)
We'll notify you of your lab results and make any med changes if needed Keep up the good work!  You're doing great! Call with any questions or concerns Hang in there!

## 2012-12-20 ENCOUNTER — Telehealth: Payer: Self-pay | Admitting: *Deleted

## 2012-12-20 LAB — T4, FREE: Free T4: 0.86 ng/dL (ref 0.60–1.60)

## 2012-12-20 LAB — TSH: TSH: 24.01 u[IU]/mL — ABNORMAL HIGH (ref 0.35–5.50)

## 2012-12-20 LAB — T3, FREE: T3, Free: 2.2 pg/mL — ABNORMAL LOW (ref 2.3–4.2)

## 2012-12-20 MED ORDER — LEVOTHYROXINE SODIUM 150 MCG PO TABS: 150.0000 ug | ORAL_TABLET | Freq: Every day | ORAL | Status: DC

## 2012-12-20 NOTE — Telephone Encounter (Signed)
Spoke with the pt and informed her of recent lab results and recommendations below per Dr. Beverely Low.   Also informed the pt that Dr. Beverely Low would like for her to schedule an follow-up visit with her in 6 weeks.  Pt understood and agreed.  New rx sent to the pharmacy by e-script and appt made..//AB/CMA

## 2012-12-20 NOTE — Telephone Encounter (Signed)
Message copied by [name] on Thu Dec 20, 2012  4:33 PM ------      Message from: Sheliah Hatch      Created: Thu Dec 20, 2012 11:59 AM       TSH is still elevated.  Needs to increase synthroid to daily ------

## 2012-12-28 ENCOUNTER — Telehealth: Payer: Self-pay | Admitting: Family Medicine

## 2012-12-28 NOTE — Telephone Encounter (Signed)
Pt called back and advised that per Dr. Beverely Low it can take up to 3 weeks for new dosage to take effect. Pt advised that if symptoms still persist that she should make an appt to be reevaluated for depression.

## 2012-12-28 NOTE — Telephone Encounter (Signed)
Patient Information:  Caller Name: [name]  Phone: [phone]  Patient: [name]  Gender: Female  DOB: [birth year]  Age: 57 Years  PCP: Sheliah Hatch.  Office Follow Up:  Does the office need to follow up with this patient?: Yes  Instructions For The Office: Caller has questions about her thyroid therapy.  Thank You.  RN Note:  Appt declined. Caller wants to know what to expect with thyroid medication.  How long does it take to feel/see a difference?  Is gradual or immediate response to the medication.  Symptoms  Reason For Call & Symptoms: Fatigue.  Caller not able to get out a bed d/t lack of energy. Pt states it is not depression.  Caller states no significant changes with increased dose of levothyroxine.  Pt has to talk herself out of bed.  Reviewed Health History In EMR: Yes  Reviewed Medications In EMR: Yes  Reviewed Allergies In EMR: Yes  Reviewed Surgeries / Procedures: Yes  Date of Onset of Symptoms: Unknown  Treatments Tried: Prescribed medication  Treatments Tried Worked: No  Guideline(s) Used:  Weakness (Generalized) and Fatigue  Disposition Per Guideline:   See Within 2 Weeks in Office  Reason For Disposition Reached:   Weakness is a chronic symptom (recurrent or ongoing AND lasting > 4 weeks)  Advice Given:  Call Back If:  Unable to stand or walk  Passes out  Breathing difficulty occurs  You become worse.  Patient Refused Recommendation:  Patient Will Follow Up With Office Later  Pt has questions.

## 2013-01-01 ENCOUNTER — Ambulatory Visit (INDEPENDENT_AMBULATORY_CARE_PROVIDER_SITE_OTHER): Payer: Medicare Other | Admitting: *Deleted

## 2013-01-01 DIAGNOSIS — I2699 Other pulmonary embolism without acute cor pulmonale: Secondary | ICD-10-CM | POA: Diagnosis not present

## 2013-01-15 ENCOUNTER — Ambulatory Visit (INDEPENDENT_AMBULATORY_CARE_PROVIDER_SITE_OTHER): Payer: Medicare Other

## 2013-01-15 DIAGNOSIS — I2699 Other pulmonary embolism without acute cor pulmonale: Secondary | ICD-10-CM | POA: Diagnosis not present

## 2013-01-15 LAB — POCT INR: INR: 4

## 2013-01-16 DIAGNOSIS — M25559 Pain in unspecified hip: Secondary | ICD-10-CM | POA: Diagnosis not present

## 2013-01-16 DIAGNOSIS — M545 Low back pain, unspecified: Secondary | ICD-10-CM | POA: Diagnosis not present

## 2013-01-29 ENCOUNTER — Ambulatory Visit (INDEPENDENT_AMBULATORY_CARE_PROVIDER_SITE_OTHER): Payer: Medicare Other | Admitting: *Deleted

## 2013-01-29 ENCOUNTER — Ambulatory Visit: Payer: Medicare Other | Admitting: Cardiology

## 2013-01-29 DIAGNOSIS — I2699 Other pulmonary embolism without acute cor pulmonale: Secondary | ICD-10-CM | POA: Diagnosis not present

## 2013-01-30 ENCOUNTER — Ambulatory Visit (INDEPENDENT_AMBULATORY_CARE_PROVIDER_SITE_OTHER): Payer: Medicare Other | Admitting: Physician Assistant

## 2013-01-30 ENCOUNTER — Encounter: Payer: Self-pay | Admitting: Physician Assistant

## 2013-01-30 ENCOUNTER — Telehealth: Payer: Self-pay | Admitting: Cardiology

## 2013-01-30 VITALS — BP 108/60 | HR 67 | Ht 67.25 in | Wt 277.0 lb

## 2013-01-30 DIAGNOSIS — F1120 Opioid dependence, uncomplicated: Secondary | ICD-10-CM

## 2013-01-30 DIAGNOSIS — Z86010 Personal history of colonic polyps: Secondary | ICD-10-CM

## 2013-01-30 DIAGNOSIS — K5909 Other constipation: Secondary | ICD-10-CM

## 2013-01-30 DIAGNOSIS — K5900 Constipation, unspecified: Secondary | ICD-10-CM

## 2013-01-30 DIAGNOSIS — F1920 Other psychoactive substance dependence, uncomplicated: Secondary | ICD-10-CM

## 2013-01-30 MED ORDER — POLYETHYLENE GLYCOL 3350 17 GM/SCOOP PO POWD: 17.0000 g | Freq: Every day | ORAL | Status: DC

## 2013-01-30 NOTE — Progress Notes (Signed)
Subjective:    Patient ID: [name], female    DOB: [birth year], 57 y.o.   MRN: [id number]  HPI [name] is a pleasant 57 year old white female known to Dr. Arlyce Dice from prior colonoscopy. She has also been seen for chronic constipation. She has numerous medical problems and is maintained on chronic Coumadin for history of pulmonary emboli. She comes in today to discuss followup colonoscopy after having received a recall letter. Her last colonoscopy was done in May of 2009 she had scattered left colon diverticulosis and 2 polyps removed . Path showed hyperplastic polyps. She has no family history of colon cancer She is narcotic dependent using both methadone and Dilaudid and has chronic problems with constipation. Arlyce Dice last seen in the office she was given a trial of Linzess but says she did not find this helpful. She says she had increased activity in her intestine but actually was not having any increase in bowel movements the she was still requiring milk of magnesia. She has no current complaints of abdominal pain. She says she may go for several days without a bowel movement have a lot of straining etc. and then if she takes a dose of milk of magnesia occasionally wind up with diarrhea. She has not noted any melena or hematochezia. She showed me reports from an ER visit in November at which time she had an acute right-sided abdominal pain which was from a rectus sheath hematoma.    Review of Systems  Constitutional: Negative.   HENT: Negative.   Eyes: Negative.   Respiratory: Negative.   Cardiovascular: Negative.   Gastrointestinal: Positive for constipation.  Endocrine: Negative.   Genitourinary: Negative.   Musculoskeletal: Positive for back pain.  Skin: Negative.   Allergic/Immunologic: Negative.   Neurological: Negative.   Hematological: Bruises/bleeds easily.  Psychiatric/Behavioral: The patient is nervous/anxious.    Outpatient Prescriptions Prior to Visit  Medication Sig  Dispense Refill  . aspirin 81 MG tablet Take 81 mg by mouth daily.        Marland Kitchen b complex vitamins capsule Take 1 capsule by mouth daily.      . Calcium Carbonate Antacid (ROLAIDS EXTRA STRENGTH) 1177 MG CHEW Chew 1 tablet by mouth as needed. For vomiting      . Cyanocobalamin (B-12) 1500 MCG TBCR Take 1 tablet by mouth daily.       Marland Kitchen HYDROmorphone (DILAUDID) 2 MG tablet Take 4 mg by mouth every 6 (six) hours as needed. For breakthrough pain      . levothyroxine (SYNTHROID, LEVOTHROID) 150 MCG tablet Take 1 tablet (150 mcg total) by mouth daily.  30 tablet  3  . magnesium hydroxide (MILK OF MAGNESIA) 400 MG/5ML suspension Take 30 mLs by mouth as needed. For nausea      . methadone (DOLOPHINE) 10 MG tablet Take 20 mg by mouth 3 (three) times daily.       . vitamin C (ASCORBIC ACID) 500 MG tablet Take 500 mg by mouth daily.        Marland Kitchen warfarin (COUMADIN) 5 MG tablet Take as directed by coumadin clinic  60 tablet  3   No facility-administered medications prior to visit.   Allergies  Allergen Reactions  . Morphine     REACTION: nausea, rash, itching, vomiting  . Nsaids     Bowel irregularity  . Venlafaxine Other (See Comments)    Reaction unknown   Patient Active Problem List   Diagnosis Date Noted  . Chronic narcotic dependence 01/30/2013  . S/P hip  replacement 11/01/2012  . Rectus sheath hematoma 07/13/2012  . Venous insufficiency (chronic) (peripheral)   . Chest pain, pleuritic 09/22/2011  . Urinary frequency 09/22/2011  . Atherosclerosis of native arteries of the extremities with ulceration(440.23) 07/21/2011  . Sinusitis 06/14/2011  . Esophageal spasm 06/14/2011  . Visual floaters 05/31/2011  . Neuropathic pain of lower extremity 05/31/2011  . General medical examination 05/16/2011  . Cellulitis 05/16/2011  . Venous (peripheral) insufficiency 05/16/2011  . Constipation 05/12/2011  . 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 UNSPECIFIED HYPOTHYROIDISM 01/19/2010  .  LEG CRAMPS, NOCTURNAL 01/18/2010  . EDEMA 01/18/2010  . HYPOKALEMIA 08/10/2007  . ANEMIA, NORMOCYTIC 08/10/2007  . TOBACCO ABUSE 08/10/2007  . MI 08/10/2007  . PULMONARY EMBOLISM 08/10/2007  . ASTHMA, CHILDHOOD 08/10/2007  . LOW BACK PAIN, CHRONIC 08/10/2007  . MEDIASTINAL ADENOPATHY CASTLEMANS D 08/10/2007  . CORONARY HEART DISEASE 08/08/2007   History  Substance Use Topics  . Smoking status: Former Smoker    Types: Cigarettes    Quit date: 06/09/2011  . Smokeless tobacco: Never Used     Comment: 2 ciggs a day  . Alcohol Use: No   family history includes Crohn's disease in her sister and Heart disease in her father and mother.     Objective:   Physical Exam well-developed chronically ill-appearing obese white female in no acute distress, appears older than stated age, anxious but pleasant blood pressure 108/60 pulse 67 height 5 foot 7 weight 277. HEENT; nontraumatic normocephalic EOMI PERRLA sclera anicteric, Neck;Supple no JVD, Cardiovascular; regular rate and rhythm with S1-S2 no murmur or gallop, Pulmonary; somewhat decreased breath sounds bilaterally, Abdomen; large soft no focal tenderness no guarding or rebound no palpable mass or hepatosplenomegaly, Rectal ;exam not done, Extremities; she has brawny edema bilaterally lower remedies, Psych ;mood and affect appropriate        Assessment & Plan:  #8 57 year old female with history of hyperplastic colon polyps, last colonoscopy May of 2009. By current AGA guidelines she does not need a followup colonoscopy for another 5 years #2 chronic anticoagulation with Coumadin #3 chronic constipation-likely secondary to chronic narcotic use #4 history of pulmonary emboli #5 coronary artery disease #6 morbid obesity #7 chronic narcotic dependence #8 chronic back pain  Plan; plan for repeat colonoscopy in 2019 from a screening perspective Long discussion regarding her chronic constipation. Advise she try MiraLax 17 g daily in 8  ounces of water on an every day basis. Also discussed safety of adjusting dose upward as needed to keep her bowels moving on a regular basis. She will continue milk of magnesia every 4-5 days if necessary. Stop Linzess Followup with Dr. Arlyce Dice as needed

## 2013-01-30 NOTE — Telephone Encounter (Signed)
New problem  Pt said she was advised to call and let you know what her doctors is planning to do.

## 2013-01-30 NOTE — Progress Notes (Signed)
Please refer pt to GI research.  We have a study for pts with narcotics related constipation

## 2013-01-30 NOTE — Patient Instructions (Addendum)
Take 1 to 1/12 doses of Miralax every day.  Take Milk of Magnesia as needed.  Follow up with Dr. Arlyce Dice as needed.  We sent a prescription for generic Miralax to [facility] [address].

## 2013-01-30 NOTE — Telephone Encounter (Signed)
Pt states that Dr Arlyce Dice informed her will wait another 4 to 5 more years for her to be scheduled for colonoscopy due to present good health. Also  Instructed by Dr Arlyce Dice to call if should have any problems related to need for colonoscopy sooner than above

## 2013-01-31 ENCOUNTER — Ambulatory Visit (INDEPENDENT_AMBULATORY_CARE_PROVIDER_SITE_OTHER): Payer: Medicare Other | Admitting: Family Medicine

## 2013-01-31 ENCOUNTER — Encounter: Payer: Self-pay | Admitting: Family Medicine

## 2013-01-31 VITALS — BP 100/70 | HR 57 | Temp 98.1°F | Ht 68.0 in | Wt 276.4 lb

## 2013-01-31 DIAGNOSIS — E039 Hypothyroidism, unspecified: Secondary | ICD-10-CM

## 2013-01-31 DIAGNOSIS — H6983 Other specified disorders of Eustachian tube, bilateral: Secondary | ICD-10-CM

## 2013-01-31 DIAGNOSIS — N3010 Interstitial cystitis (chronic) without hematuria: Secondary | ICD-10-CM | POA: Diagnosis not present

## 2013-01-31 DIAGNOSIS — H6980 Other specified disorders of Eustachian tube, unspecified ear: Secondary | ICD-10-CM | POA: Insufficient documentation

## 2013-01-31 DIAGNOSIS — H6993 Unspecified Eustachian tube disorder, bilateral: Secondary | ICD-10-CM

## 2013-01-31 DIAGNOSIS — H6990 Unspecified Eustachian tube disorder, unspecified ear: Secondary | ICD-10-CM | POA: Insufficient documentation

## 2013-01-31 LAB — POCT URINALYSIS DIPSTICK
Bilirubin, UA: NEGATIVE
Glucose, UA: NEGATIVE
Ketones, UA: NEGATIVE
Spec Grav, UA: <=1.005

## 2013-01-31 MED ORDER — CEPHALEXIN 500 MG PO CAPS: 500.0000 mg | ORAL_CAPSULE | Freq: Two times a day (BID) | ORAL | Status: AC

## 2013-01-31 MED ORDER — FLUTICASONE PROPIONATE 50 MCG/ACT NA SUSP: 2.0000 | Freq: Every day | NASAL | Status: DC

## 2013-01-31 NOTE — Progress Notes (Signed)
  Subjective:    Patient ID: [name], female    DOB: 10-Mar-1956, 57 y.o.   MRN: [id number]  HPI Hypothyroid- dose was increased to at last visit.  Pt feels that energy has improved since increasing the dose.  Less fatigue.  Skin is less dry.  IC- pt reports sensation of needing to go but then when she attempts to go, 'there's nothing there'.  Was previously following w/ Dr Earlene Plater (urology) but he moved to [facility].  Hx of hematuria.  No dysuria.  + odor.  Eustachian tube dysfxn- hx of similar, previously saw Dr Lazarus Salines.  Pt is now trying to 'blow out' her ears w/out relief.  Denies pain but 'it's very annoying'.   Review of Systems For ROS see HPI     Objective:   Physical Exam  Vitals reviewed. Constitutional: She appears well-developed and well-nourished. No distress.  HENT:  Head: Normocephalic and atraumatic.  Right Ear: Tympanic membrane normal.  Left Ear: Tympanic membrane normal.  Nose: Mucosal edema and rhinorrhea present. Right sinus exhibits no maxillary sinus tenderness and no frontal sinus tenderness. Left sinus exhibits no maxillary sinus tenderness and no frontal sinus tenderness.  Mouth/Throat: Mucous membranes are normal. Posterior oropharyngeal erythema (w/ PND) present.  Eyes: Conjunctivae and EOM are normal. Pupils are equal, round, and reactive to light.  Neck: Normal range of motion. Neck supple.  Cardiovascular: Normal rate, regular rhythm and normal heart sounds.   Pulmonary/Chest: Effort normal and breath sounds normal. No respiratory distress. She has no wheezes. She has no rales.  Lymphadenopathy:    She has no cervical adenopathy.          Assessment & Plan:

## 2013-01-31 NOTE — Patient Instructions (Addendum)
We'll notify you of your lab results and make any changes if needed Start the Flonase- 2 sprays each nostril Drink plenty of fluids You're good to start the exercise bike Add a probiotic Call with any questions or concerns Hang in there!!

## 2013-02-01 ENCOUNTER — Encounter: Payer: Self-pay | Admitting: *Deleted

## 2013-02-03 NOTE — Assessment & Plan Note (Signed)
New to provider, ongoing for pt.  Since pt is reporting urinary odor, check UA.  UA abnormal, start keflex.  Await urine cx.  Pt expressed understanding and is in agreement w/ plan.

## 2013-02-03 NOTE — Assessment & Plan Note (Signed)
New.  Likely due to untreated seasonal allergies.  Start nasal steroid spray.  Reviewed supportive care and red flags that should prompt return.  Pt expressed understanding and is in agreement w/ plan.

## 2013-02-03 NOTE — Assessment & Plan Note (Signed)
Chronic problem.  Has been difficult to get TSH WNL.  sxs seem to be improving.  Check TSH, adjust dose prn.

## 2013-02-05 ENCOUNTER — Telehealth: Payer: Self-pay | Admitting: *Deleted

## 2013-02-05 ENCOUNTER — Telehealth: Payer: Self-pay | Admitting: Cardiology

## 2013-02-05 ENCOUNTER — Telehealth: Payer: Self-pay | Admitting: Physician Assistant

## 2013-02-05 NOTE — Telephone Encounter (Signed)
Spoke with patient and she states she has taken 1 dose of Miralax daily. States it is the end of the month and she does not have any money for extra medications. Suggested she try 2-3 doses of Miralax daily as needed and MOM prn. She will try this.

## 2013-02-05 NOTE — Telephone Encounter (Signed)
New Problem   Pt wants to let you know that her PCP has started her KEFLEX 500 MG. She said that she just wanted to let the coumadin clinic know.

## 2013-02-05 NOTE — Telephone Encounter (Signed)
Message copied by Verdie Shire on Tue Feb 05, 2013  3:54 PM ------      Message from: [name]      Created: Sun Feb 03, 2013  8:49 PM       No obvious UTI- have her sxs improved? ------

## 2013-02-05 NOTE — Telephone Encounter (Signed)
Please advise.//AB/CMA   Spoke with the pt and informed her of recent UC results.  Pt understood.  Asked the pt if her sxs have improved?   Pt stated that she still have the odor,still having to push down,and the frequency has gotten better.  Pt stated that she started on the Keflex on (02-01-13) and she is still taking it.  She wanted to know if she need to continue on the Keflex.//AB/CMA

## 2013-02-05 NOTE — Telephone Encounter (Signed)
Spoke with pt and informed her that Keflex is ok with coumadin, but call us if she is placed on another ABX.

## 2013-02-06 NOTE — Telephone Encounter (Signed)
Called and LM @ (4:08pm) informing the pt that Dr. Rennis Golden would like for her to finish the Keflex as prescribed.  Asked the pt to call back if she has any questions or concerns.//AB/CMA

## 2013-02-06 NOTE — Telephone Encounter (Signed)
Should finish the Keflex as prescribed

## 2013-02-12 ENCOUNTER — Telehealth: Payer: Self-pay | Admitting: Gastroenterology

## 2013-02-12 NOTE — Telephone Encounter (Signed)
Pt states she stopped taking miralax 4 days ago. States she woke up and she was lying in a "tragic mess." Pt state she is going to have to purchase another mattress. Pt now states that she is leaking stool constantly. Offered pt an appt for tomorrow. Pt states she will call back later today to let me know if she can come due to transportation.

## 2013-02-13 ENCOUNTER — Encounter: Payer: Self-pay | Admitting: Gastroenterology

## 2013-02-13 ENCOUNTER — Ambulatory Visit (INDEPENDENT_AMBULATORY_CARE_PROVIDER_SITE_OTHER): Payer: Medicare Other | Admitting: Gastroenterology

## 2013-02-13 VITALS — BP 100/58 | HR 72 | Ht 67.25 in | Wt 269.6 lb

## 2013-02-13 DIAGNOSIS — K5900 Constipation, unspecified: Secondary | ICD-10-CM | POA: Diagnosis not present

## 2013-02-13 DIAGNOSIS — K5909 Other constipation: Secondary | ICD-10-CM

## 2013-02-13 MED ORDER — LUBIPROSTONE 24 MCG PO CAPS: 24.0000 ug | ORAL_CAPSULE | Freq: Two times a day (BID) | ORAL | Status: DC

## 2013-02-13 NOTE — Progress Notes (Signed)
02/13/2013 [name] [id number] Jul 07, 1956   History of Present Illness:  Patient is a 57 year old female who is a patient of Dr. Marzetta Board.  She has been seen for chronic constipation on several occasions in the past. She has numerous medical problems and is maintained on chronic Coumadin for history of pulmonary emboli.  Her last colonoscopy was done in May of 2009 she had scattered left colon diverticulosis and 2 polyps removed . Path showed hyperplastic polyps.  She has no family history of colon cancer.  It was determined that repeat colonoscopy should be performed in 12/2017.  She is narcotic dependent using both methadone and Dilaudid and has chronic problems with constipation.  She was given a trial of Linzess recently, but says she did not find this helpful. She says she had increased activity in her intestine but actually was not having any increase in bowel movements the she was still requiring milk of magnesia.  Recently she was taking Miralax 3 times a day for several days and still did not have a BM in 9 days.  After taking all of this Miralax, things finally broke loose in the night this past weekend, and she had a large "blow-out" of a BM while in bed.  Since then she has small amounts of runny stool even when she urinates, and she had a formed BM a couple of days ago.  She just wants to find something for constipation that she can take every day that will help her go regularly.  She showed me reports from an ER visit in November at which time she had an acute right-sided abdominal pain which was from a rectus sheath hematoma.   Current Medications, Allergies, Past Medical History, Past Surgical History, Family History and Social History were reviewed in [employer] record.   Physical Exam: BP 100/58  Pulse 72  Ht 5' 7.25" (1.708 m)  Wt 269 lb 9.6 oz (122.29 kg)  BMI 41.92 kg/m2 General: Well developed, chronically ill-appearing white female in no acute  distress; appears older than stated age Head: Normocephalic and atraumatic Eyes:  sclerae anicteric, conjunctiva pink  Ears: Normal auditory acuity Lungs: Clear throughout to auscultation Heart: Regular rate and rhythm Abdomen: Soft, non-tender and non-distended. No masses, no hepatomegaly. Normal bowel sounds. Musculoskeletal: Symmetrical with no gross deformities  Extremities: Brawny edema noted in B/L LE's Neurological: Alert oriented x 4, grossly nonfocal Psychological:  Alert and cooperative. Normal mood and affect  Assessment and Recommendations: -Chronic, narcotic induced, constipation:  Will start her on amitiza 24 mcg BID with food.  She will follow-up in 4-6 weeks.  If no improvement on this medication, then we will have her discuss with the research nurses regarding trial medications.

## 2013-02-13 NOTE — Progress Notes (Signed)
Reviewed and agree with management. Barbette Hair. Arlyce Dice, M.D., [facility]  Will have GI research review for suitability of study

## 2013-02-13 NOTE — Telephone Encounter (Signed)
Pt is seeing Doug Sou PA today at 1:30pm.

## 2013-02-13 NOTE — Patient Instructions (Addendum)
We have sent the following medications to your pharmacy for you to pick up at your convenience: amitiza  Please follow up with Dr. Arlyce Dice in 4-6 weeks                                               We are excited to introduce MyChart, a new best-in-class service that provides you online access to important information in your electronic medical record. We want to make it easier for you to view your health information - all in one secure location - when and where you need it. We expect MyChart will enhance the quality of care and service we provide.  When you register for MyChart, you can:    View your test results.    Request appointments and receive appointment reminders via email.    Request medication renewals.    View your medical history, allergies, medications and immunizations.    Communicate with your physician's office through a password-protected site.    Conveniently print information such as your medication lists.  To find out if MyChart is right for you, please talk to a member of our clinical staff today. We will gladly answer your questions about this free health and wellness tool.  If you are age 22 or older and want a member of your family to have access to your record, you must provide written consent by completing a proxy form available at our office. Please speak to our clinical staff about guidelines regarding accounts for patients younger than age 59.  As you activate your MyChart account and need any technical assistance, please call the MyChart technical support line at [phone]) or email your question to [email or web address]. If you email your question(s), please include your name, a return phone number and the best time to reach you.  If you have non-urgent health-related questions, you can send a message to our office through MyChart at [email or web address]. If you have a medical emergency, call 911.  Thank you for using MyChart  as your new health and wellness resource!   MyChart licensed from [employer],  [id number]. Patents Pending.

## 2013-02-19 ENCOUNTER — Ambulatory Visit (INDEPENDENT_AMBULATORY_CARE_PROVIDER_SITE_OTHER): Payer: Medicare Other | Admitting: *Deleted

## 2013-02-19 DIAGNOSIS — I2699 Other pulmonary embolism without acute cor pulmonale: Secondary | ICD-10-CM

## 2013-03-18 ENCOUNTER — Telehealth: Payer: Self-pay | Admitting: *Deleted

## 2013-03-18 NOTE — Telephone Encounter (Signed)
It is unusual to cause urinary retention but not impossible.  Should stop Flonase and see if sxs improve.

## 2013-03-18 NOTE — Telephone Encounter (Signed)
Pt called and stated that flonase is making her congested with a thick mucous. Also patient stated that she unable to urinate while she has been on this medication. Would like to speak with doctor about this matter.  Please advise SW//CMA

## 2013-03-19 ENCOUNTER — Ambulatory Visit (INDEPENDENT_AMBULATORY_CARE_PROVIDER_SITE_OTHER): Payer: Medicare Other | Admitting: *Deleted

## 2013-03-19 DIAGNOSIS — I2699 Other pulmonary embolism without acute cor pulmonale: Secondary | ICD-10-CM | POA: Diagnosis not present

## 2013-03-19 LAB — POCT INR: INR: 2

## 2013-04-03 ENCOUNTER — Other Ambulatory Visit: Payer: Self-pay | Admitting: Pharmacist

## 2013-04-03 ENCOUNTER — Other Ambulatory Visit: Payer: Self-pay | Admitting: *Deleted

## 2013-04-03 MED ORDER — LEVOTHYROXINE SODIUM 150 MCG PO TABS: 150.0000 ug | ORAL_TABLET | Freq: Every day | ORAL | Status: DC

## 2013-04-03 MED ORDER — WARFARIN SODIUM 5 MG PO TABS: ORAL_TABLET | ORAL | Status: DC

## 2013-04-03 NOTE — Telephone Encounter (Signed)
Rx was refilled for Levothyroxine 150 mcg.  Ag cma

## 2013-04-16 ENCOUNTER — Other Ambulatory Visit: Payer: Self-pay | Admitting: *Deleted

## 2013-04-16 ENCOUNTER — Ambulatory Visit (INDEPENDENT_AMBULATORY_CARE_PROVIDER_SITE_OTHER): Payer: Medicare Other | Admitting: *Deleted

## 2013-04-16 DIAGNOSIS — I2699 Other pulmonary embolism without acute cor pulmonale: Secondary | ICD-10-CM

## 2013-04-16 MED ORDER — LEVOTHYROXINE SODIUM 150 MCG PO TABS: 150.0000 ug | ORAL_TABLET | Freq: Every day | ORAL | Status: DC

## 2013-05-06 ENCOUNTER — Ambulatory Visit (INDEPENDENT_AMBULATORY_CARE_PROVIDER_SITE_OTHER): Payer: Medicare Other | Admitting: *Deleted

## 2013-05-06 DIAGNOSIS — I2699 Other pulmonary embolism without acute cor pulmonale: Secondary | ICD-10-CM

## 2013-05-06 LAB — POCT INR: INR: 2.4

## 2013-06-03 ENCOUNTER — Ambulatory Visit (INDEPENDENT_AMBULATORY_CARE_PROVIDER_SITE_OTHER): Payer: Medicare Other | Admitting: General Practice

## 2013-06-03 DIAGNOSIS — I2699 Other pulmonary embolism without acute cor pulmonale: Secondary | ICD-10-CM

## 2013-06-03 DIAGNOSIS — Z23 Encounter for immunization: Secondary | ICD-10-CM

## 2013-06-12 ENCOUNTER — Telehealth: Payer: Self-pay

## 2013-06-12 NOTE — Telephone Encounter (Signed)
Returned call to pt. After receiving voice message regarding increased drainage of clear fluid from left leg, after hitting it.  Stated the drainage has stopped, and area is dry.  Reported has an open ulcer on inner aspect of left ankle, approx. size of pencil eraser head.  Stated has swelling in the bilateral feet up to the ankles.  Is using compression with ace wraps daily.  Stated she makes an effort to prop her legs up during day, when able.  Reports she washes her open sore with Dial soap and applies Bacitracin ointment daily.  Stated she takes good care of her feet and legs.  When questioned about any redness, stated she gets varying degrees of redness.  Presently, denies any inflammation surrounding the ulcer.  Advised will make appt. to be reevaluated by Dr. Darrick Penna.   Pt. agrees w/ plan.   Encouraged to continue to keep ulcer area clean/ dry, to continue daily compression wraps, and to elevate legs above level of heart at intervals during day. Verb. understanding.

## 2013-06-19 ENCOUNTER — Encounter: Payer: Self-pay | Admitting: Vascular Surgery

## 2013-06-20 ENCOUNTER — Ambulatory Visit: Payer: Medicare Other | Admitting: Vascular Surgery

## 2013-06-26 ENCOUNTER — Encounter: Payer: Self-pay | Admitting: Vascular Surgery

## 2013-06-27 ENCOUNTER — Encounter (INDEPENDENT_AMBULATORY_CARE_PROVIDER_SITE_OTHER): Payer: Self-pay

## 2013-06-27 ENCOUNTER — Encounter: Payer: Self-pay | Admitting: Vascular Surgery

## 2013-06-27 ENCOUNTER — Ambulatory Visit (INDEPENDENT_AMBULATORY_CARE_PROVIDER_SITE_OTHER): Payer: Medicare Other | Admitting: Vascular Surgery

## 2013-06-27 VITALS — BP 109/74 | HR 53 | Temp 98.0°F | Resp 16 | Ht 68.0 in | Wt 274.0 lb

## 2013-06-27 DIAGNOSIS — L98499 Non-pressure chronic ulcer of skin of other sites with unspecified severity: Secondary | ICD-10-CM | POA: Diagnosis not present

## 2013-06-27 DIAGNOSIS — I739 Peripheral vascular disease, unspecified: Secondary | ICD-10-CM | POA: Diagnosis not present

## 2013-06-27 DIAGNOSIS — M79609 Pain in unspecified limb: Secondary | ICD-10-CM

## 2013-06-27 NOTE — Progress Notes (Signed)
[name] is a 57 y.o. female who presents with chief complaint: swollen leg with ulceration.  She has also had multiple prior exacerbations and remissions of ulcerations since her prior DVT in 2008. We have followed her intermittently since 2012. Her most recent episode was in September of this year. The patient has had a history of DVT and pulmonary embolus. She is on chronic coumadin. There is a family history of varicose veins. The patient has used compression stockings in the past but has not worn them for several months. She continues to be non-compliant with compression stockings.  She recently has developed several scattered ulcerations on her lower extremities and these are all in various states of healing  Review of systems: She denies shortness of breath. She denies chest pain.  Physical exam:  Filed Vitals:   06/27/13 1701  BP: 109/74  Pulse: 53  Temp: 98 F (36.7 C)  TempSrc: Oral  Resp: 16  Height: 5\' 8"  (1.727 m)  Weight: 274 lb (124.286 kg)  SpO2: 98%   Lower extremities: Scattered 2-3 mm ulcerations with brawny staining gaiter area bilaterally, pink warm and well-perfused feet, largest ulcers are 2 cm in size near the left and right medial malleolus  Assessment: Recurrent venous stasis ulcers, known deep and superficial incompetence  Plan: Unna boot bilaterally change once weekly x4 weeks emphasized the patient today the importance of continued compression therapy after the ulcers are healed. Emphasized to her how important compression therapy is in preventing these ulcers from occurring as well as protecting her skin long-term.  Spoke with patient today about considering laser ablation of her greater saphenous vein. She was reluctant to do this in the past. I believe that she has been having more recent episodes in may benefit from this. She is quite reluctant due to the fact she is on Coumadin and was kept off of the several days. We will talk about this again after  her ulcers are healed.  Fabienne Bruns, MD [facility] Office: [phone] Pager: [phone]

## 2013-07-03 ENCOUNTER — Ambulatory Visit (INDEPENDENT_AMBULATORY_CARE_PROVIDER_SITE_OTHER): Payer: Medicare Other

## 2013-07-03 DIAGNOSIS — L98499 Non-pressure chronic ulcer of skin of other sites with unspecified severity: Secondary | ICD-10-CM | POA: Diagnosis not present

## 2013-07-03 DIAGNOSIS — I739 Peripheral vascular disease, unspecified: Secondary | ICD-10-CM

## 2013-07-03 NOTE — Progress Notes (Signed)
[name] was seen today for bilateral unna boot reapplication per Dr. Darrick Penna. She states that her legs were looking and feeling much better. She had already removed both unna boots and cleaned her legs prior to her visit. The unna boots were reapplied without difficultly. She will return on 12/24 for reapplication. Chanda Maness-Harrison CMA, AAMA

## 2013-07-11 ENCOUNTER — Encounter (INDEPENDENT_AMBULATORY_CARE_PROVIDER_SITE_OTHER): Payer: Medicare Other

## 2013-07-11 DIAGNOSIS — I739 Peripheral vascular disease, unspecified: Secondary | ICD-10-CM | POA: Diagnosis not present

## 2013-07-11 DIAGNOSIS — L98499 Non-pressure chronic ulcer of skin of other sites with unspecified severity: Secondary | ICD-10-CM | POA: Diagnosis not present

## 2013-07-12 ENCOUNTER — Other Ambulatory Visit: Payer: Self-pay | Admitting: Family Medicine

## 2013-07-12 NOTE — Telephone Encounter (Signed)
Med filled.  

## 2013-07-18 ENCOUNTER — Ambulatory Visit (INDEPENDENT_AMBULATORY_CARE_PROVIDER_SITE_OTHER): Payer: Medicare Other | Admitting: General Practice

## 2013-07-18 DIAGNOSIS — I2699 Other pulmonary embolism without acute cor pulmonale: Secondary | ICD-10-CM

## 2013-07-24 ENCOUNTER — Encounter: Payer: Self-pay | Admitting: Vascular Surgery

## 2013-07-25 ENCOUNTER — Ambulatory Visit: Payer: Medicare Other | Admitting: Vascular Surgery

## 2013-08-15 ENCOUNTER — Ambulatory Visit (INDEPENDENT_AMBULATORY_CARE_PROVIDER_SITE_OTHER): Payer: Medicare Other | Admitting: *Deleted

## 2013-08-15 DIAGNOSIS — I2699 Other pulmonary embolism without acute cor pulmonale: Secondary | ICD-10-CM

## 2013-08-15 LAB — POCT INR: INR: 2

## 2013-09-03 ENCOUNTER — Telehealth: Payer: Self-pay | Admitting: Cardiology

## 2013-09-03 NOTE — Telephone Encounter (Signed)
Spoke with pt.  She was started on Medrol dose pak today. May increase INR.  Last INR was on low side at 2.0.  Pt has follow up scheduled on 2/5.  Will continue current dose, have her eat extra vitamin K foods and keep follow up appt as scheduled.

## 2013-09-03 NOTE — Telephone Encounter (Signed)
New message         There has been a change in her medication and she wanted tiffany or erika to call her back from the coumadin clinic

## 2013-09-12 ENCOUNTER — Ambulatory Visit (INDEPENDENT_AMBULATORY_CARE_PROVIDER_SITE_OTHER): Payer: Medicare Other

## 2013-09-12 DIAGNOSIS — I2699 Other pulmonary embolism without acute cor pulmonale: Secondary | ICD-10-CM

## 2013-09-12 LAB — POCT INR: INR: 1.7

## 2013-10-11 ENCOUNTER — Ambulatory Visit (INDEPENDENT_AMBULATORY_CARE_PROVIDER_SITE_OTHER): Payer: Medicare Other | Admitting: Cardiology

## 2013-10-11 ENCOUNTER — Ambulatory Visit (INDEPENDENT_AMBULATORY_CARE_PROVIDER_SITE_OTHER): Payer: Medicare Other

## 2013-10-11 ENCOUNTER — Encounter: Payer: Self-pay | Admitting: Cardiology

## 2013-10-11 VITALS — BP 118/72 | HR 68 | Ht 68.0 in | Wt 281.0 lb

## 2013-10-11 DIAGNOSIS — I2699 Other pulmonary embolism without acute cor pulmonale: Secondary | ICD-10-CM

## 2013-10-11 DIAGNOSIS — I2510 Atherosclerotic heart disease of native coronary artery without angina pectoris: Secondary | ICD-10-CM

## 2013-10-11 LAB — POCT INR: INR: 2.3

## 2013-10-11 NOTE — Patient Instructions (Signed)
Continue same medications.   Your physician wants you to follow-up in: 1 year.  You will receive a reminder letter in the mail two months in advance. If you don't receive a letter, please call our office to schedule the follow-up appointment.  

## 2013-10-11 NOTE — Progress Notes (Signed)
HPI  Patient is seen today to followup mild coronary disease. I saw her last in the past. She was seen most recently by Mr. Kathlen Mody in March, 2014. She had significant pulmonary emboli after orthopedic surgery. LV function improve. RV function improved. She has very mild coronary disease. She's actually been stable.  Allergies  Allergen Reactions  . Amitiza [Lubiprostone] Diarrhea and Nausea And Vomiting  . Morphine     REACTION: nausea, rash, itching, vomiting  . Nsaids     Bowel irregularity  . Venlafaxine Other (See Comments)    Reaction unknown    Current Outpatient Prescriptions  Medication Sig Dispense Refill  . aspirin 81 MG tablet Take 81 mg by mouth daily.        . Calcium Carbonate Antacid (ROLAIDS EXTRA STRENGTH) 1177 MG CHEW Chew 1 tablet by mouth as needed. For vomiting      . Cyanocobalamin (B-12) 1500 MCG TBCR Take 1 tablet by mouth daily.       Marland Kitchen HYDROmorphone (DILAUDID) 2 MG tablet Take 4 mg by mouth every 6 (six) hours as needed. For breakthrough pain      . levothyroxine (SYNTHROID, LEVOTHROID) 150 MCG tablet TAKE 1 TABLET (150 MCG TOTAL) BY MOUTH DAILY.  90 tablet  0  . magnesium hydroxide (MILK OF MAGNESIA) 400 MG/5ML suspension Take 30 mLs by mouth as needed. For nausea      . methadone (DOLOPHINE) 10 MG tablet Take 20 mg by mouth 3 (three) times daily.       . vitamin C (ASCORBIC ACID) 500 MG tablet Take 500 mg by mouth daily.        Marland Kitchen warfarin (COUMADIN) 5 MG tablet Take as directed by coumadin clinic  150 tablet  1   No current facility-administered medications for this visit.    History   Social History  . Marital Status: Divorced    Spouse Name: N/A    Number of Children: 1  . Years of Education: N/A   Occupational History  . Disabled    Social History Main Topics  . Smoking status: Former Smoker    Types: Cigarettes    Quit date: 06/09/2011  . Smokeless tobacco: Never Used     Comment: 2 ciggs a day  . Alcohol Use: No  . Drug Use: No  .  Sexual Activity: Not on file   Other Topics Concern  . Not on file   Social History Narrative   Current smoker wi last 12 mos.     Family History  Problem Relation Age of Onset  . Crohn's disease Sister   . Heart disease Mother   . Heart disease Father     Past Medical History  Diagnosis Date  . Other pulmonary embolism and infarction     Significant 2008 and coumadin therapy RV dysfunction...echo...2008..EF 50%..right ventricle markedly dilated w marked right ventricular dysfunc and moder tricuspid regurg/echo..March 2010, Ef 50%, mild dilation of right ventricule w mild decrease right ventric function   . Hypopotassemia   . Tobacco use disorder   . Anemia, unspecified   . Hypotension, unspecified   . Chronic low back pain   . CAD (coronary artery disease)     minimal catheterization, October 2008  . Chest pain     with stress  . Constipation     and diarrhea chronic..Dr. Alben Spittle  . Right bundle branch block     intermittent  . Leg pain   . Back pain   .  Methadone adverse reaction     for chronic leg and back pain  . Homocystinemia     signif elevation in the past...plan folic acid, B6, M35  . Lymphoproliferative disease     disorder in the past??  . Cellulitis   . Thyroid disease   . Peripheral vascular disease   . Venous insufficiency (chronic) (peripheral)     Patient's legs were wrapped 2013  . Diverticulosis of colon (without mention of hemorrhage)   . Benign neoplasm of colon 12/2007    Hyperplastic colon polyps    Past Surgical History  Procedure Laterality Date  . Cholecystectomy    . Gastric bypass    . Tonsillectomy    . Joint replacement      right total hip replacement  . Rotator cuff repair      right shoulder    Patient Active Problem List   Diagnosis Date Noted  . CAD (coronary artery disease)   . Pain in limb-Bilateral Leg 06/27/2013  . Interstitial cystitis 01/31/2013  . Eustachian tube dysfunction 01/31/2013  . Chronic narcotic  dependence 01/30/2013  . S/P hip replacement 11/01/2012  . Rectus sheath hematoma 07/13/2012  . Venous insufficiency (chronic) (peripheral)   . Chest pain, pleuritic 09/22/2011  . Urinary frequency 09/22/2011  . Atherosclerosis of native arteries of the extremities with ulceration(440.23) 07/21/2011  . Sinusitis 06/14/2011  . Esophageal spasm 06/14/2011  . Visual floaters 05/31/2011  . Neuropathic pain of lower extremity 05/31/2011  . General medical examination 05/16/2011  . Cellulitis 05/16/2011  . Venous (peripheral) insufficiency 05/16/2011  . Constipation 05/12/2011  . 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 UNSPECIFIED HYPOTHYROIDISM 01/19/2010  . LEG CRAMPS, NOCTURNAL 01/18/2010  . EDEMA 01/18/2010  . HYPOKALEMIA 08/10/2007  . ANEMIA, NORMOCYTIC 08/10/2007  . TOBACCO ABUSE 08/10/2007  . PULMONARY EMBOLISM 08/10/2007  . ASTHMA, CHILDHOOD 08/10/2007  . LOW BACK PAIN, CHRONIC 08/10/2007  . MEDIASTINAL ADENOPATHY CASTLEMANS D 08/10/2007    ROS   Patient denies fever, chills, headache, sweats, rash, change in vision, change in hearing, chest pain, cough, nausea or vomiting, urinary symptoms. All other systems are reviewed and are negative.  PHYSICAL EXAM   Patient is oriented to person time and place. Affect is normal. She is caring for an infant. There is no jugulovenous distention. Lungs are clear. Respiratory effort is nonlabored. Cardiac exam reveals S1 and S2. There no clicks or significant murmurs. Abdomen is soft. There is no peripheral edema.  Filed Vitals:   10/11/13 1532  BP: 118/72  Pulse: 68  Height: 5\' 8"  (1.727 m)  Weight: 281 lb (127.461 kg)   EKG is done today and reviewed by me. There is no change from the past. There is low voltage.  ASSESSMENT & PLAN

## 2013-10-11 NOTE — Assessment & Plan Note (Signed)
Patient is mild coronary disease. She's not having significant symptoms. She does not need any further workup. She is on aspirin. It would be appropriate to consider checking her risk score to decide if a statin should be used.

## 2013-10-11 NOTE — Assessment & Plan Note (Signed)
She had extensive pulmonary emboli in 2009. Fortunately she is recovered nicely. Treatment is on Coumadin.

## 2013-10-19 ENCOUNTER — Other Ambulatory Visit: Payer: Self-pay | Admitting: Family Medicine

## 2013-10-19 ENCOUNTER — Other Ambulatory Visit: Payer: Self-pay | Admitting: Cardiology

## 2013-10-21 ENCOUNTER — Encounter: Payer: Self-pay | Admitting: General Practice

## 2013-10-21 NOTE — Telephone Encounter (Signed)
Med filled and letter mailed.  

## 2013-10-23 ENCOUNTER — Telehealth: Payer: Self-pay | Admitting: Cardiology

## 2013-10-23 MED ORDER — WARFARIN SODIUM 5 MG PO TABS: ORAL_TABLET | ORAL | Status: DC

## 2013-10-23 NOTE — Telephone Encounter (Signed)
New message          Pt would like refill of warfarin sent to [facility] on [address] for 3 months

## 2013-11-01 ENCOUNTER — Ambulatory Visit (INDEPENDENT_AMBULATORY_CARE_PROVIDER_SITE_OTHER): Payer: Medicare Other | Admitting: Family Medicine

## 2013-11-01 ENCOUNTER — Encounter: Payer: Self-pay | Admitting: Family Medicine

## 2013-11-01 ENCOUNTER — Ambulatory Visit (INDEPENDENT_AMBULATORY_CARE_PROVIDER_SITE_OTHER): Payer: Medicare Other

## 2013-11-01 VITALS — BP 116/78 | HR 63 | Temp 98.0°F | Resp 16 | Wt 281.2 lb

## 2013-11-01 DIAGNOSIS — E039 Hypothyroidism, unspecified: Secondary | ICD-10-CM | POA: Diagnosis not present

## 2013-11-01 DIAGNOSIS — I2699 Other pulmonary embolism without acute cor pulmonale: Secondary | ICD-10-CM | POA: Diagnosis not present

## 2013-11-01 DIAGNOSIS — H6980 Other specified disorders of Eustachian tube, unspecified ear: Secondary | ICD-10-CM | POA: Diagnosis not present

## 2013-11-01 DIAGNOSIS — I2510 Atherosclerotic heart disease of native coronary artery without angina pectoris: Secondary | ICD-10-CM | POA: Diagnosis not present

## 2013-11-01 LAB — TSH: TSH: 5.26 u[IU]/mL (ref 0.35–5.50)

## 2013-11-01 LAB — POCT INR: INR: 1.5

## 2013-11-01 NOTE — Progress Notes (Signed)
   Subjective:    Patient ID: [name], female    DOB: [birth year], 58 y.o.   MRN: [id number]  HPI Hypothyroid- chronic problem, on Synthroid 136mcg daily.  Denies excessive fatigue, changes to skin/hair/nails.  R ear static- comes and goes, 'feels like water'.  Muffled hearing.  sxs started 'a couple of weeks ago'.  Noise has woke her from sleep.  No dizziness.   Review of Systems For ROS see HPI     Objective:   Physical Exam  Vitals reviewed. Constitutional: She appears well-developed and well-nourished. No distress.  HENT:  Head: Normocephalic and atraumatic.  Right Ear: Tympanic membrane normal.  Left Ear: Tympanic membrane normal.  Nose: Mucosal edema and rhinorrhea present. Right sinus exhibits no maxillary sinus tenderness and no frontal sinus tenderness. Left sinus exhibits no maxillary sinus tenderness and no frontal sinus tenderness.  Mouth/Throat: Mucous membranes are normal. Posterior oropharyngeal erythema (w/ PND) present.  Eyes: Conjunctivae and EOM are normal. Pupils are equal, round, and reactive to light.  Neck: Normal range of motion. Neck supple.  Cardiovascular: Normal rate, regular rhythm and intact distal pulses.   Pulmonary/Chest: Effort normal and breath sounds normal. No respiratory distress. She has no wheezes. She has no rales.  Lymphadenopathy:    She has no cervical adenopathy.          Assessment & Plan:

## 2013-11-01 NOTE — Progress Notes (Signed)
Pre visit review using our clinic review tool, if applicable. No additional management support is needed unless otherwise documented below in the visit note. 

## 2013-11-01 NOTE — Patient Instructions (Signed)
Schedule your complete physical in 3 months We'll notify you of your lab results and make any changes if needed Start Claritin or Zyrtec daily Restart the Flonase 2 sprays each nostril daily Drink plenty of fluids Hang in there!!!

## 2013-11-03 NOTE — Assessment & Plan Note (Signed)
Pt's sxs and PE consistent w/ untreated nasal allergies and eustachian tube dysfunction.  OTC antihistamine, nasal steroid.  Reviewed supportive care and red flags that should prompt return.  Pt expressed understanding and is in agreement w/ plan.

## 2013-11-03 NOTE — Assessment & Plan Note (Signed)
Chronic problem.  Has had difficulty in past getting appropriate synthroid dose. Check labs.  Adjust meds prn meds prn.

## 2013-11-04 ENCOUNTER — Other Ambulatory Visit: Payer: Self-pay | Admitting: General Practice

## 2013-11-04 MED ORDER — LEVOTHYROXINE SODIUM 175 MCG PO TABS: 175.0000 ug | ORAL_TABLET | Freq: Every day | ORAL | Status: DC

## 2013-11-15 ENCOUNTER — Ambulatory Visit (INDEPENDENT_AMBULATORY_CARE_PROVIDER_SITE_OTHER): Payer: Medicare Other

## 2013-11-15 DIAGNOSIS — I2699 Other pulmonary embolism without acute cor pulmonale: Secondary | ICD-10-CM | POA: Diagnosis not present

## 2013-11-15 LAB — POCT INR: INR: 1.4

## 2013-11-22 ENCOUNTER — Ambulatory Visit (INDEPENDENT_AMBULATORY_CARE_PROVIDER_SITE_OTHER): Payer: Medicare Other | Admitting: *Deleted

## 2013-11-22 DIAGNOSIS — I2699 Other pulmonary embolism without acute cor pulmonale: Secondary | ICD-10-CM | POA: Diagnosis not present

## 2013-11-22 LAB — POCT INR: INR: 2.1

## 2013-11-29 ENCOUNTER — Telehealth: Payer: Self-pay | Admitting: Family Medicine

## 2013-11-29 NOTE — Telephone Encounter (Signed)
Caller name:Tiajah Gerilyn Nestle Relation to AL:[id number] PCP:Dr Tabori Call back [phone] Pharmacy:  Reason for call: Patient called and stated that she can not remember if she took her synthroid medication today or not. Should patient go ahead and take another one or just wait until tomorrow to take it. Please advise.

## 2013-11-29 NOTE — Telephone Encounter (Signed)
Agree w/ advice given 

## 2013-11-29 NOTE — Telephone Encounter (Signed)
Pt was advised not to take another dose, but to resume her normal dose tomorrow at her regularly scheduled time.  She was also advise to either track her medication intake on a calendar or to buy a 7-day pill container that she could fill each week.  That way she could visually see if the medication has been taken or not.   Patient stated understanding and no further questions or concerns were voiced.

## 2013-12-19 ENCOUNTER — Telehealth: Payer: Self-pay

## 2013-12-19 DIAGNOSIS — M79609 Pain in unspecified limb: Secondary | ICD-10-CM

## 2013-12-19 NOTE — Telephone Encounter (Signed)
Phone call from pt.  C/o pain in her right leg intermittently.  States she has pain at this time, behind her right knee.  Reports that she gets shooting pain in her right lower leg at night, sometimes, that wakes her up.  Reports that other times she has pain with activity; "it will ease-up when I lay down a little while."  States she does have varicose veins, but the pain isn't necessarily located within the varicosities.  States she continues to wrap her lower legs with ace bandages for compression.  Denies any swelling in the right lower extremity.  Denies any ulcerations on the right leg.  Reports a small ulcer on the inner aspect of the left leg that is drying up.  Reports her ankles have darker discoloration than previously.   Will discuss with Dr. Oneida Alar for further recommendations.

## 2013-12-19 NOTE — Telephone Encounter (Signed)
Offered to make pt an appointment on 12/20/13 at 10:30am, however she declined, stating "I am keeping my grandchildren while their parents are in court." She asked to make the appointment on 12/23/13 instead, dpm

## 2013-12-19 NOTE — Telephone Encounter (Signed)
Discussed with Dr. Oneida Alar. Recommended to schedule a Venous duplex to r/o DVT, initially.  Will contact pt. for appt.

## 2013-12-20 ENCOUNTER — Encounter: Payer: Self-pay | Admitting: Family

## 2013-12-23 ENCOUNTER — Ambulatory Visit (INDEPENDENT_AMBULATORY_CARE_PROVIDER_SITE_OTHER): Payer: Medicare Other | Admitting: Family

## 2013-12-23 ENCOUNTER — Encounter: Payer: Self-pay | Admitting: Family

## 2013-12-23 ENCOUNTER — Ambulatory Visit (HOSPITAL_COMMUNITY)
Admission: RE | Admit: 2013-12-23 | Discharge: 2013-12-23 | Disposition: A | Discharge status: Home / Self Care | Payer: Medicare Other | Source: Ambulatory Visit | Attending: Family | Admitting: Family

## 2013-12-23 ENCOUNTER — Ambulatory Visit (INDEPENDENT_AMBULATORY_CARE_PROVIDER_SITE_OTHER): Payer: Medicare Other | Admitting: *Deleted

## 2013-12-23 VITALS — BP 96/66 | HR 61 | Resp 14 | Ht 68.5 in | Wt 276.0 lb

## 2013-12-23 DIAGNOSIS — I739 Peripheral vascular disease, unspecified: Secondary | ICD-10-CM | POA: Diagnosis not present

## 2013-12-23 DIAGNOSIS — Z86718 Personal history of other venous thrombosis and embolism: Secondary | ICD-10-CM | POA: Insufficient documentation

## 2013-12-23 DIAGNOSIS — L98499 Non-pressure chronic ulcer of skin of other sites with unspecified severity: Secondary | ICD-10-CM | POA: Diagnosis not present

## 2013-12-23 DIAGNOSIS — Z86711 Personal history of pulmonary embolism: Secondary | ICD-10-CM | POA: Diagnosis not present

## 2013-12-23 DIAGNOSIS — Z7901 Long term (current) use of anticoagulants: Secondary | ICD-10-CM | POA: Insufficient documentation

## 2013-12-23 DIAGNOSIS — I2699 Other pulmonary embolism without acute cor pulmonale: Secondary | ICD-10-CM | POA: Diagnosis not present

## 2013-12-23 DIAGNOSIS — M79609 Pain in unspecified limb: Secondary | ICD-10-CM

## 2013-12-23 DIAGNOSIS — I8310 Varicose veins of unspecified lower extremity with inflammation: Secondary | ICD-10-CM | POA: Insufficient documentation

## 2013-12-23 LAB — POCT INR: INR: 2.9

## 2013-12-23 NOTE — Progress Notes (Signed)
Established Venous Insufficiency  History of Present Illness  [name] is a 58 y.o. ([birth year]) female patient that Dr. Oneida Alar has been following for chronic venous insufficiency.  She has also had multiple prior exacerbations and remissions of ulcerations since her prior DVT in 2008. We have followed her intermittently since 2012. Her most recent episode was in September of this year. The patient has had a history of DVT and pulmonary embolus. She is on chronic coumadin. There is a family history of varicose veins. The patient has used compression stockings in the past but has not worn them for several months.  Her venous stasis ulcers have all healed. She has been using ace wraps on her lower legs, has tried the compression stockings and cannot tolerate. She has chronic pain in her hips and knees from severe OA. She states her ortho has told her that she is not a candidate for any further total joint replacement. Dr. Ron Parker manages her coumadin. Staged bilateral GSV ablation is contemplated to relieve chronic venous hypertension, seek Dr. Ron Parker input re need to be off coumadin for a week, whether she needs Lovenox bridge.  She presents with chief complaint: pain behind right knee and anterior left knee pain in right leg with activity and at rest, wakes her up at night.   She stopped smoking in 2012, she does not have DM. She was recently started on thyroid hormone replacement medication.   Past Medical History  Diagnosis Date  . Other pulmonary embolism and infarction     Significant 2008 and coumadin therapy RV dysfunction...echo...2008..EF 50%..right ventricle markedly dilated w marked right ventricular dysfunc and moder tricuspid regurg/echo..March 2010, Ef 50%, mild dilation of right ventricule w mild decrease right ventric function   . Hypopotassemia   . Tobacco use disorder   . Anemia, unspecified   . Hypotension, unspecified   . Chronic low back pain   . CAD (coronary  artery disease)     minimal catheterization, October 2008  . Chest pain     with stress  . Constipation     and diarrhea chronic..Dr. Alben Spittle  . Right bundle branch block     intermittent  . Leg pain   . Back pain   . Methadone adverse reaction     for chronic leg and back pain  . Homocystinemia     signif elevation in the past...plan folic acid, B6, N46  . Lymphoproliferative disease     disorder in the past??  . Cellulitis   . Thyroid disease   . Peripheral vascular disease   . Venous insufficiency (chronic) (peripheral)     Patient's legs were wrapped 2013  . Diverticulosis of colon (without mention of hemorrhage)   . Benign neoplasm of colon 12/2007    Hyperplastic colon polyps   Past Surgical History  Procedure Laterality Date  . Cholecystectomy    . Gastric bypass    . Tonsillectomy    . Joint replacement      right total hip replacement  . Rotator cuff repair      right shoulder   History   Social History  . Marital Status: Divorced    Spouse Name: N/A    Number of Children: 1  . Years of Education: N/A   Occupational History  . Disabled    Social History Main Topics  . Smoking status: Former Smoker    Types: Cigarettes    Quit date: 06/09/2011  . Smokeless tobacco: Never Used  Comment: 2 ciggs a day  . Alcohol Use: No  . Drug Use: No  . Sexual Activity: Not on file   Other Topics Concern  . Not on file   Social History Narrative   Current smoker wi last 12 mos.    Family History  Problem Relation Age of Onset  . Crohn's disease Sister   . Heart disease Mother   . Heart disease Father    Current Outpatient Prescriptions on File Prior to Visit  Medication Sig Dispense Refill  . aspirin 81 MG tablet Take 81 mg by mouth daily.        . Calcium Carbonate Antacid (ROLAIDS EXTRA STRENGTH) 1177 MG CHEW Chew 1 tablet by mouth as needed. For vomiting      . HYDROmorphone (DILAUDID) 2 MG tablet Take 4 mg by mouth every 6 (six) hours as  needed. For breakthrough pain      . levothyroxine (SYNTHROID, LEVOTHROID) 175 MCG tablet Take 1 tablet (175 mcg total) by mouth daily before breakfast.  90 tablet  1  . magnesium hydroxide (MILK OF MAGNESIA) 400 MG/5ML suspension Take 30 mLs by mouth as needed. For nausea      . methadone (DOLOPHINE) 10 MG tablet Take 20 mg by mouth 3 (three) times daily.       Marland Kitchen warfarin (COUMADIN) 5 MG tablet Take as directed by anticoagulation clinic  150 tablet  1   No current facility-administered medications on file prior to visit.   Allergies  Allergen Reactions  . Amitiza [Lubiprostone] Diarrhea and Nausea And Vomiting  . Morphine     REACTION: nausea, rash, itching, vomiting  . Nsaids     Bowel irregularity  . Venlafaxine Other (See Comments)    Reaction unknown    REVIEW OF SYSTEMS: see HPI for pertinent positives and negatives.   Physical Examination  Filed Vitals:   12/23/13 1424  BP: 96/66  Pulse: 61  Resp: 14  Height: 5' 8.5" (1.74 m)  Weight: 276 lb (125.193 kg)  SpO2: 96%   Body mass index is 41.35 kg/(m^2).  PHYSICAL EXAMINATION: General: The patient appears their stated age, morbidly obese, walking with cane.   HEENT:  No gross abnormalities Pulmonary: Respirations are non-labored., BBS are CTA A&P Abdomen: Soft and non-tender. Musculoskeletal: There are no major deformities, pain at posterior aspect of knee on palpation.   Neurologic: No focal weakness or paresthesias are detected, Skin: There are no ulcers, skin of lower legs is leathery with dark staining. Psychiatric: The patient has normal affect. Cardiovascular: There is a regular rate and rhythm without significant murmur appreciated.    Vascular: Vessel Right Left  Radial 2+Palpable 2+Palpable  Carotid  without bruit without bruit  Aorta Not palpable N/A  Popliteal Not palpable Not palpable  PT Palpable Palpable  DP Palpable Palpable   Non-Invasive Vascular Imaging  LOWER EXTREMITY VENOUS DUPLEX  REFLUX EVALUATION    INDICATION: Varicose veins, pain in right lower extremity    INTERVENTION(S): History of deep vein thrombosis of the left lower extremity. History of pulmonary embolus. Chronic venous stasis, Patient is on Coumadin.    DUPLEX EXAM: Right lower extremity venous evaluation.    RIGHT VEIN LEFT  Reflux > 500 ms Thrombosis  Reflux > 500 ms Thrombosis  Y N Common Femoral Vein Y N  Y N Femoral Vein    Y N Popliteal Vein    Y N Saphenofemoral Junction    Y N Great Saphenous Vein    N  N Small Saphenous Vein      Thrombosis:  N = None, A = Acute, C = Chronic, O = Obstructive, P = Partially Obstructive                                        Reflux:  N = None, Y = Yes       See attached diagram(s) for additional diagnostic information.    NOTE:  Incompetence is defined as continuous reflux > 500 milliseconds with distal augmentation maneuvers or valsalva     ADDITIONAL FINDINGS: Enlarged  lymph node of the right groin    IMPRESSION: 1. No evidence of acute deep vein thrombosis of the right lower extremity. 2. Probable old fluid collection of the right popliteal fossa measuring 3.0 x 1.5 cm. 3. Significant reflux in the deep and superficial system of the right lower extremity, see attached.    Medical Decision Making  Jheri Mitter is a 58 y.o. female who presents with: bilateral leg chronic venous insufficiency, recurrent venous stasis ulcers, bilateral venous hypertension, bilateral greater saphenous vein reflux. Her right anterior and posterior knee pain is not due to DVT, no evidence of DVT found on venous study today. She has strongly palpable pedal pulses, evidence of good arterial perfusion to her feet. Staged bilateral GSV ablation is contemplated to relieve chronic venous hypertension, seek Dr. Ron Parker input re need to be off coumadin for a week, whether she needs Lovenox bridge. Dr. Kellie Simmering spoke with and examined pt; she needs left leg venous reflux study, return  in 6 weeks for this and see Dr. Kellie Simmering. Continue ace wraps to lower legs during the day, elevation of feet above heart level when not walking.   Thank you for allowing [place] to participate in this patient's care.  Sharmon Leyden Nickel, RN, MSN, FNP-C [facility] Office: [phone]  12/23/2013, 2:13 PM  Clinic MD: Trula Slade

## 2013-12-23 NOTE — Patient Instructions (Addendum)
Venous Stasis or Chronic Venous Insufficiency Chronic venous insufficiency, also called venous stasis, is a condition that affects the veins in the legs. The condition prevents blood from being pumped through these veins effectively. Blood may no longer be pumped effectively from the legs back to the heart. This condition can range from mild to severe. With proper treatment, you should be able to continue with an active life. CAUSES  Chronic venous insufficiency occurs when the vein walls become stretched, weakened, or damaged or when valves within the vein are damaged. Some common causes of this include:  High blood pressure inside the veins (venous hypertension).  Increased blood pressure in the leg veins from long periods of sitting or standing.  A blood clot that blocks blood flow in a vein (deep vein thrombosis).  Inflammation of a superficial vein (phlebitis) that causes a blood clot to form. RISK FACTORS Various things can make you more likely to develop chronic venous insufficiency, including:  Family history of this condition.  Obesity.  Pregnancy.  Sedentary lifestyle.  Smoking.  Jobs requiring long periods of standing or sitting in one place.  Being a certain age. Women in their 24s and 44s and men in their 59s are more likely to develop this condition. SIGNS AND SYMPTOMS  Symptoms may include:   Varicose veins.  Skin breakdown or ulcers.  Reddened or discolored skin on the leg.  Brown, smooth, tight, and painful skin just above the ankle, usually on the inside surface (lipodermatosclerosis).  Swelling. DIAGNOSIS  To diagnose this condition, your health care provider will take a medical history and do a physical exam. The following tests may be ordered to confirm the diagnosis:  Duplex ultrasound A procedure that produces a picture of a blood vessel and nearby organs and also provides information on blood flow through the blood vessel.  Plethysmography A  procedure that tests blood flow.  A venogram, or venography A procedure used to look at the veins using X-ray and dye. TREATMENT The goals of treatment are to help you return to an active life and to minimize pain or disability. Treatment will depend on the severity of the condition. Medical procedures may be needed for severe cases. Treatment options may include:   Use of compression stockings. These can help with symptoms and lower the chances of the problem getting worse, but they do not cure the problem.  Sclerotherapy A procedure involving an injection of a material that "dissolves" the damaged veins. Other veins in the network of blood vessels take over the function of the damaged veins.  Surgery to remove the vein or cut off blood flow through the vein (vein stripping or laser ablation surgery).  Surgery to repair a valve.  Elevation of feet above heart level when not walking. HOME CARE INSTRUCTIONS   Wear compression stockings as directed by your health care provider.  Only take over-the-counter or prescription medicines for pain, discomfort, or fever as directed by your health care provider.  Follow up with your health care provider as directed. SEEK MEDICAL CARE IF:   You have redness, swelling, or increasing pain in the affected area.  You see a red streak or line that extends up or down from the affected area.  You have a breakdown or loss of skin in the affected area, even if the breakdown is small.  You have an injury to the affected area. SEEK IMMEDIATE MEDICAL CARE IF:   You have an injury and open wound in the affected area.  Your pain is severe and does not improve with medicine.  You have sudden numbness or weakness in the foot or ankle below the affected area, or you have trouble moving your foot or ankle.  You have a fever or persistent symptoms for more than 2 3 days.  You have a fever and your symptoms suddenly get worse. MAKE SURE YOU:   Understand  these instructions.  Will watch your condition.  Will get help right away if you are not doing well or get worse. Document Released: 11/28/2006 Document Revised: 05/15/2013 Document Reviewed: 04/01/2013 [facility] Patient Information 2014 [place].

## 2013-12-24 ENCOUNTER — Encounter: Payer: Self-pay | Admitting: Cardiology

## 2013-12-31 DIAGNOSIS — M5126 Other intervertebral disc displacement, lumbar region: Secondary | ICD-10-CM | POA: Diagnosis not present

## 2013-12-31 DIAGNOSIS — M5137 Other intervertebral disc degeneration, lumbosacral region: Secondary | ICD-10-CM | POA: Diagnosis not present

## 2014-01-03 ENCOUNTER — Ambulatory Visit (INDEPENDENT_AMBULATORY_CARE_PROVIDER_SITE_OTHER): Payer: Medicare Other | Admitting: Pharmacist

## 2014-01-03 DIAGNOSIS — I2699 Other pulmonary embolism without acute cor pulmonale: Secondary | ICD-10-CM

## 2014-01-03 LAB — POCT INR: INR: 2.6

## 2014-01-15 ENCOUNTER — Telehealth: Payer: Self-pay | Admitting: Cardiology

## 2014-01-15 MED ORDER — WARFARIN SODIUM 5 MG PO TABS: ORAL_TABLET | ORAL | Status: DC

## 2014-01-15 NOTE — Telephone Encounter (Signed)
New message     [facility] a 90day supply of coumadin-----[facility]

## 2014-01-24 ENCOUNTER — Ambulatory Visit (INDEPENDENT_AMBULATORY_CARE_PROVIDER_SITE_OTHER): Payer: Medicare Other | Admitting: *Deleted

## 2014-01-24 DIAGNOSIS — I2699 Other pulmonary embolism without acute cor pulmonale: Secondary | ICD-10-CM | POA: Diagnosis not present

## 2014-01-24 LAB — POCT INR: INR: 4.2

## 2014-02-03 ENCOUNTER — Encounter: Payer: Self-pay | Admitting: Vascular Surgery

## 2014-02-04 ENCOUNTER — Encounter (HOSPITAL_COMMUNITY): Payer: Medicare Other

## 2014-02-04 ENCOUNTER — Ambulatory Visit: Payer: Medicare Other | Admitting: Vascular Surgery

## 2014-02-12 ENCOUNTER — Telehealth: Payer: Self-pay | Admitting: General Practice

## 2014-02-12 NOTE — Telephone Encounter (Signed)
Called pt to schedule an appt after receiving her letter in the mail. LMOVM to return call to office to schedule a follow up for Thyroid.

## 2014-02-20 NOTE — Telephone Encounter (Signed)
Close encounter 

## 2014-02-24 ENCOUNTER — Ambulatory Visit: Payer: Medicare Other | Admitting: Family Medicine

## 2014-05-21 ENCOUNTER — Ambulatory Visit (INDEPENDENT_AMBULATORY_CARE_PROVIDER_SITE_OTHER): Payer: Medicare Other | Admitting: Pharmacist

## 2014-05-21 DIAGNOSIS — I2699 Other pulmonary embolism without acute cor pulmonale: Secondary | ICD-10-CM | POA: Diagnosis not present

## 2014-05-21 LAB — POCT INR: INR: 1.9

## 2014-06-10 ENCOUNTER — Ambulatory Visit (INDEPENDENT_AMBULATORY_CARE_PROVIDER_SITE_OTHER): Payer: Medicare Other | Admitting: *Deleted

## 2014-06-10 ENCOUNTER — Ambulatory Visit (INDEPENDENT_AMBULATORY_CARE_PROVIDER_SITE_OTHER): Payer: Medicare Other | Admitting: Family Medicine

## 2014-06-10 ENCOUNTER — Encounter: Payer: Self-pay | Admitting: Family Medicine

## 2014-06-10 ENCOUNTER — Ambulatory Visit (INDEPENDENT_AMBULATORY_CARE_PROVIDER_SITE_OTHER): Payer: Medicare Other | Admitting: General Practice

## 2014-06-10 VITALS — BP 118/72 | HR 57 | Temp 98.2°F | Resp 16 | Wt 291.4 lb

## 2014-06-10 DIAGNOSIS — Z23 Encounter for immunization: Secondary | ICD-10-CM

## 2014-06-10 DIAGNOSIS — E785 Hyperlipidemia, unspecified: Secondary | ICD-10-CM | POA: Diagnosis not present

## 2014-06-10 DIAGNOSIS — Z6841 Body Mass Index (BMI) 40.0 and over, adult: Secondary | ICD-10-CM | POA: Diagnosis not present

## 2014-06-10 DIAGNOSIS — E038 Other specified hypothyroidism: Secondary | ICD-10-CM | POA: Diagnosis not present

## 2014-06-10 DIAGNOSIS — E7800 Pure hypercholesterolemia, unspecified: Secondary | ICD-10-CM | POA: Insufficient documentation

## 2014-06-10 DIAGNOSIS — E039 Hypothyroidism, unspecified: Secondary | ICD-10-CM

## 2014-06-10 DIAGNOSIS — I2699 Other pulmonary embolism without acute cor pulmonale: Secondary | ICD-10-CM

## 2014-06-10 DIAGNOSIS — I739 Peripheral vascular disease, unspecified: Secondary | ICD-10-CM

## 2014-06-10 DIAGNOSIS — Z20828 Contact with and (suspected) exposure to other viral communicable diseases: Secondary | ICD-10-CM | POA: Diagnosis not present

## 2014-06-10 DIAGNOSIS — Z205 Contact with and (suspected) exposure to viral hepatitis: Secondary | ICD-10-CM | POA: Insufficient documentation

## 2014-06-10 DIAGNOSIS — Z79899 Other long term (current) drug therapy: Secondary | ICD-10-CM

## 2014-06-10 DIAGNOSIS — Z5181 Encounter for therapeutic drug level monitoring: Secondary | ICD-10-CM

## 2014-06-10 DIAGNOSIS — L98499 Non-pressure chronic ulcer of skin of other sites with unspecified severity: Secondary | ICD-10-CM

## 2014-06-10 LAB — BASIC METABOLIC PANEL
BUN: 7 mg/dL (ref 6–23)
CO2: 30 mEq/L (ref 19–32)
Calcium: 8.1 mg/dL — ABNORMAL LOW (ref 8.4–10.5)
Chloride: 102 mEq/L (ref 96–112)
Creatinine, Ser: 0.9 mg/dL (ref 0.4–1.2)
GFR: 70.48 mL/min (ref >60.00)
Glucose, Bld: 75 mg/dL (ref 70–99)
Potassium: 3.4 mEq/L — ABNORMAL LOW (ref 3.5–5.1)
Sodium: 138 mEq/L (ref 135–145)

## 2014-06-10 LAB — LIPID PANEL
CHOL/HDL RATIO: 5
Cholesterol: 124 mg/dL (ref 0–200)
HDL: 25.2 mg/dL — ABNORMAL LOW (ref >39.00)
LDL CALC: 77 mg/dL (ref 0–99)
NONHDL: 98.8
Triglycerides: 107 mg/dL (ref 0.0–149.0)
VLDL: 21.4 mg/dL (ref 0.0–40.0)

## 2014-06-10 LAB — T4, FREE: Free T4: 0.35 ng/dL — ABNORMAL LOW (ref 0.60–1.60)

## 2014-06-10 LAB — HEPATIC FUNCTION PANEL
ALT: 17 U/L (ref 0–35)
AST: 26 U/L (ref 0–37)
Albumin: 3 g/dL — ABNORMAL LOW (ref 3.5–5.2)
Alkaline Phosphatase: 85 U/L (ref 39–117)
BILIRUBIN TOTAL: 0.3 mg/dL (ref 0.2–1.2)
Bilirubin, Direct: 0 mg/dL (ref 0.0–0.3)
Total Protein: 7.3 g/dL (ref 6.0–8.3)

## 2014-06-10 LAB — HEMOGLOBIN A1C: Hgb A1c MFr Bld: 5.5 % (ref 4.6–6.5)

## 2014-06-10 LAB — HEPATITIS C ANTIBODY: HCV AB: NEGATIVE

## 2014-06-10 LAB — T3, FREE: T3, Free: 2.3 pg/mL (ref 2.3–4.2)

## 2014-06-10 LAB — POCT INR: INR: 2.1

## 2014-06-10 LAB — TSH: TSH: 44.07 u[IU]/mL — AB (ref 0.35–4.50)

## 2014-06-10 NOTE — Patient Instructions (Signed)
Schedule your complete physical in 6 months We'll notify you of your lab results and make any changes if needed Please schedule follow up w/ Dr Oneida Alar (vascular) Call with any questions or concerns Hang in there!!!

## 2014-06-10 NOTE — Assessment & Plan Note (Signed)
New.  Pt is unable to exercise due to chronic joint pain.  Not eating healthy diet.  Stressed need to do this.  Check labs to risk stratify.  Will continue to follow closely.

## 2014-06-10 NOTE — Assessment & Plan Note (Signed)
Noted on previous labs.  Pt was intolerant to statin.  Check labs and start meds prn to reduce risk.

## 2014-06-10 NOTE — Progress Notes (Signed)
   Subjective:    Patient ID: [name], female    DOB: 1955-09-01, 58 y.o.   MRN: [id number]  HPI Hypothyroid- chronic problem, pt reports she stopped taking Synthroid in March and has not returned for OV or labs since.  Reports she had 'severe side effects'- 'increased pain, body aches- i couldn't get out of the bed'.  + fatigue.  + depression- improved after stopping thyroid meds.  Hx of chronic pain.  Hep C testing- pt has seen public service announcements recommending testing.  Hyperlipidemia- noted on previous labs.  Was intolerant to Atorvastatin.  Obesity- pt's BMI is 43.  Not exercising regularly, not following particular diet.   Review of Systems For ROS see HPI     Objective:   Physical Exam  Constitutional: She is oriented to person, place, and time. She appears well-developed and well-nourished. No distress.  HENT:  Head: Normocephalic and atraumatic.  Eyes: Conjunctivae and EOM are normal. Pupils are equal, round, and reactive to light.  Neck: Normal range of motion. Neck supple. No thyromegaly present.  Cardiovascular: Normal rate, regular rhythm, normal heart sounds and intact distal pulses.   No murmur heard. Pulmonary/Chest: Effort normal and breath sounds normal. No respiratory distress.  Abdominal: Soft. She exhibits no distension. There is no tenderness.  Musculoskeletal: She exhibits no edema.  Lymphadenopathy:    She has no cervical adenopathy.  Neurological: She is alert and oriented to person, place, and time.  Skin: Skin is warm and dry.  Psychiatric: She has a normal mood and affect. Her behavior is normal.  Vitals reviewed.         Assessment & Plan:

## 2014-06-10 NOTE — Progress Notes (Signed)
Pre visit review using our clinic review tool, if applicable. No additional management support is needed unless otherwise documented below in the visit note. 

## 2014-06-10 NOTE — Assessment & Plan Note (Signed)
Due to pt's age, will follow CDC recommendations and have pt tested.

## 2014-06-10 NOTE — Assessment & Plan Note (Signed)
Chronic problem.  Pt stopped all thyroid supplementation w/o discussing this w/ office.  Pt continues to have fatigue.  Discussed that this is likely due to her untreated hypothyroid.  Check labs and restart meds prn.  Will likely need to refer to Endo due to pt's resistance to treatment.

## 2014-06-11 ENCOUNTER — Other Ambulatory Visit: Payer: Self-pay | Admitting: Family Medicine

## 2014-06-11 DIAGNOSIS — E038 Other specified hypothyroidism: Secondary | ICD-10-CM

## 2014-06-13 ENCOUNTER — Telehealth: Payer: Self-pay | Admitting: Family Medicine

## 2014-06-13 MED ORDER — LEVOTHYROXINE SODIUM 100 MCG PO TABS: 100.0000 ug | ORAL_TABLET | Freq: Every day | ORAL | Status: DC

## 2014-06-13 NOTE — Telephone Encounter (Signed)
Caller name: Etha Relation to pt: self Call back number: [phone] [facility] [address]  Reason for call:   Patient states that she was told to restart taking levothyroxine 165mcg but she does not have any 150mcg, she states that she has 167mcg. She states that endocrinology called and has appointment with them on 11/17.

## 2014-06-13 NOTE — Telephone Encounter (Signed)
Med filled.  

## 2014-06-24 ENCOUNTER — Encounter: Payer: Self-pay | Admitting: Internal Medicine

## 2014-06-24 ENCOUNTER — Ambulatory Visit (INDEPENDENT_AMBULATORY_CARE_PROVIDER_SITE_OTHER): Payer: Medicare Other | Admitting: Internal Medicine

## 2014-06-24 VITALS — BP 112/70 | HR 68 | Temp 97.7°F | Resp 12 | Ht 68.5 in | Wt 289.0 lb

## 2014-06-24 DIAGNOSIS — L98499 Non-pressure chronic ulcer of skin of other sites with unspecified severity: Secondary | ICD-10-CM | POA: Diagnosis not present

## 2014-06-24 DIAGNOSIS — E039 Hypothyroidism, unspecified: Secondary | ICD-10-CM | POA: Diagnosis not present

## 2014-06-24 DIAGNOSIS — I739 Peripheral vascular disease, unspecified: Secondary | ICD-10-CM | POA: Diagnosis not present

## 2014-06-24 NOTE — Progress Notes (Signed)
Patient ID: [name], female   DOB: [birth year], 58 y.o.   MRN: [id number]   HPI  [name] is a 58 y.o.-year-old female, referred by her PCP, Dr. Birdie Riddle, for management of hypothyroidism.  Pt. has been dx with hypothyroidism in ~2011; was on Levothyroxine 100 mcg, which she stopped in 10/2013 by herself! >> TSH subsequently increased to 44 and she was feeling poorly and gained 15 lbs, as expected.  She mentions that she stopped taking her levothyroxine in 10/2013 because her dose was increased at that time and she started to have crying spells and losing her hair. She also was very irascible. She tells me this did not no that she cannot stop the medicine completely.  She was advised by PCP to restart the LT4 but she was resistant. However, she restarted 2 weeks ago.  She takes the  LT4: - EVERY DAY - fasting, at 5 am - with water - separated by at least 2h from first meal - + calcium with food - 3 pm and bedtime - no iron - no multivitamins  - + PPI prn - at least 4h after the LT4  I reviewed pt's thyroid tests: Lab Results  Component Value Date   TSH 44.07* 06/10/2014   TSH 5.26 11/01/2013   TSH 3.73 01/31/2013   TSH 24.01* 12/19/2012   TSH 94.84* 11/01/2012   TSH 14.01* 05/16/2011   TSH 4.35 02/11/2010   TSH 7.53* 01/18/2010   FREET4 0.35* 06/10/2014   FREET4 0.87 01/31/2013   FREET4 0.86 12/19/2012   FREET4 0.69 05/16/2011   FREET4 0.86 01/22/2010    Pt describes: - + weight gain  - + fatigue - + cold and heat intolerance - + depression - +++ (opioid) constipation - no dry skin - no hair falling  Pt denies feeling nodules in neck, hoarseness, dysphagia/odynophagia, SOB with lying down.  She has + FH of thyroid disorders in: mother, aunts, pt's sister. No FH of thyroid cancer.  No h/o radiation tx to head or neck. No recent use of iodine supplements.  She has venous insufficiency. Had PE in the past >> on Coumadin.  ROS: Constitutional: + see HPI, +  hot flashes, nocturia Eyes: + blurry vision, no xerophthalmia ENT: no sore throat, no nodules palpated in throat, + dysphagia/no odynophagia, no hoarseness; + tinnitus Cardiovascular: + all: CP/SOB/palpitations/leg swelling Respiratory: no cough/SOB Gastrointestinal: no N/V/D/+ C/+ heartburn Musculoskeletal: + all: muscle/joint aches Skin: no rashes, + easy bruising Neurological: no tremors/numbness/tingling/dizziness Psychiatric:+ depression/no anxiety  Past Medical History  Diagnosis Date  . Other pulmonary embolism and infarction     Significant 2008 and coumadin therapy RV dysfunction...echo...2008..EF 50%..right ventricle markedly dilated w marked right ventricular dysfunc and moder tricuspid regurg/echo..March 2010, Ef 50%, mild dilation of right ventricule w mild decrease right ventric function   . Hypopotassemia   . Tobacco use disorder   . Anemia, unspecified   . Hypotension, unspecified   . Chronic low back pain   . CAD (coronary artery disease)     minimal catheterization, October 2008  . Chest pain     with stress  . Constipation     and diarrhea chronic..Dr. Alben Spittle  . Right bundle branch block     intermittent  . Leg pain   . Back pain   . Methadone adverse reaction     for chronic leg and back pain  . Homocystinemia     signif elevation in the past...plan folic acid, B6, W58  .  Lymphoproliferative disease     disorder in the past??  . Cellulitis   . Thyroid disease   . Peripheral vascular disease   . Venous insufficiency (chronic) (peripheral)     Patient's legs were wrapped 2013  . Diverticulosis of colon (without mention of hemorrhage)   . Benign neoplasm of colon 12/2007    Hyperplastic colon polyps   Past Surgical History  Procedure Laterality Date  . Cholecystectomy    . Gastric bypass    . Tonsillectomy    . Joint replacement      right total hip replacement  . Rotator cuff repair      right shoulder   History   Social History  .  Marital Status: Divorced    Spouse Name: N/A    Number of Children: 1   Occupational History  . Disabled    Social History Main Topics  . Smoking status: Former Smoker    Types: Cigarettes    Quit date: 06/09/2011  . Smokeless tobacco: Never Used     Comment: 2 ciggs a day  . Alcohol Use: No  . Drug Use: No   Social History Narrative   Current smoker wi last 12 mos.    Current Outpatient Prescriptions on File Prior to Visit  Medication Sig Dispense Refill  . aspirin 81 MG tablet Take 81 mg by mouth daily.      . Calcium Carbonate Antacid (ROLAIDS EXTRA STRENGTH) 1177 MG CHEW Chew 1 tablet by mouth as needed. For vomiting    . HYDROmorphone (DILAUDID) 4 MG tablet   0  . levothyroxine (SYNTHROID, LEVOTHROID) 100 MCG tablet Take 1 tablet (100 mcg total) by mouth daily. 30 tablet 6  . magnesium hydroxide (MILK OF MAGNESIA) 400 MG/5ML suspension Take 30 mLs by mouth as needed. For nausea    . methadone (DOLOPHINE) 10 MG tablet Take 20 mg by mouth 3 (three) times daily.     Marland Kitchen warfarin (COUMADIN) 5 MG tablet Take as directed by anticoagulation clinic 180 tablet 1   No current facility-administered medications on file prior to visit.   Allergies  Allergen Reactions  . Amitiza [Lubiprostone] Diarrhea and Nausea And Vomiting  . Morphine     REACTION: nausea, rash, itching, vomiting  . Nsaids     Bowel irregularity  . Venlafaxine Other (See Comments)    Reaction unknown   Family History  Problem Relation Age of Onset  . Crohn's disease Sister   . Heart disease Mother   . Heart disease Father    PE: BP 112/70 mmHg  Pulse 68  Temp(Src) 97.7 F (36.5 C) (Oral)  Resp 12  Ht 5' 8.5" (1.74 m)  Wt 289 lb (131.09 kg)  BMI 43.30 kg/m2  SpO2 96% Wt Readings from Last 3 Encounters:  06/24/14 289 lb (131.09 kg)  06/10/14 291 lb 6 oz (132.167 kg)  12/23/13 276 lb (125.193 kg)   Constitutional: overweight, in NAD Eyes: PERRLA, EOMI, no exophthalmos ENT: moist mucous membranes,  no thyromegaly, no cervical lymphadenopathy Cardiovascular: RRR, No MRG Respiratory: CTA B Gastrointestinal: abdomen soft, NT, ND, BS+ Musculoskeletal: no deformities, strength intact in all 4 Skin: moist, warm, no rashes Neurological: no tremor with outstretched hands, DTR normal in all 4  ASSESSMENT: 1. Hypothyroidism - intermittently compliant with levothyroxine  PLAN:  1. Patient with long-standing hypothyroidism, noncompliant with levothyroxine the past, which he mentions was because she felt bad on the higher levothyroxine dose that she was prescribed. She was off the medication  for about  7 months, after which her TSH increased to 44, she had weight gain, fatigue, and malaise. She was not restarted on levothyroxine therapy at the 100 g daily, which she started to take every day, as prescribed. She does not have side effects from this dose, which was the same that she stopped in 10/2013. She still feels poorly, but she just started the medicine 2 weeks ago. She does not appear to have a goiter, thyroid nodules, or neck compression symptoms. She does ask me whether I believe that she has thyroid cancer. I do not feel any thyroid nodules that palpation of her neck, so I reassured her that this is very unlikely. - we discussed about the importance of taking the levothyroxine every day so that she can start feeling better - We discussed about correct intake of levothyroxine, fasting, with water, separated by at least 30 minutes from breakfast, and separated by more than 4 hours from calcium, iron, multivitamins, acid reflux medications (PPIs). She apparently takes it correctly. - will check thyroid tests in 1.5 mo:  Orders Placed This Encounter  Procedures  . T4, free  . TSH  - for now, will continue LT4 100 g daily - If these are abnormal, she will need to return in 6-8 weeks for repeat labs - If these are normal, I will see her back in 3 months  - time spent with the patient: 45 min,  of which >50% was spent in obtaining information about her symptoms, reviewing her previous labs, evaluations, and treatments, counseling her about her condition (please see the discussed topics above), and developing a plan to further investigate it. She had a number of questions which I addressed.

## 2014-06-24 NOTE — Patient Instructions (Signed)
Please continue the Levothyroxine 100 mcg daily.  Take the thyroid hormone every day, with water, >30 minutes before breakfast, separated by >4 hours from acid reflux medications, calcium, iron, multivitamins. Please come back for labs in 1.5 months. Come back for a visit in 3 months.  Hypothyroidism The thyroid is a large gland located in the lower front of your neck. The thyroid gland helps control metabolism. Metabolism is how your body handles food. It controls metabolism with the hormone thyroxine. When this gland is underactive (hypothyroid), it produces too little hormone.  CAUSES These include:   Absence or destruction of thyroid tissue.  Goiter due to iodine deficiency.  Goiter due to medications.  Congenital defects (since birth).  Problems with the pituitary. This causes a lack of TSH (thyroid stimulating hormone). This hormone tells the thyroid to turn out more hormone. SYMPTOMS  Lethargy (feeling as though you have no energy)  Cold intolerance  Weight gain (in spite of normal food intake)  Dry skin  Coarse hair  Menstrual irregularity (if severe, may lead to infertility)  Slowing of thought processes Cardiac problems are also caused by insufficient amounts of thyroid hormone. Hypothyroidism in the newborn is cretinism, and is an extreme form. It is important that this form be treated adequately and immediately or it will lead rapidly to retarded physical and mental development. DIAGNOSIS  To prove hypothyroidism, your caregiver may do blood tests and ultrasound tests. Sometimes the signs are hidden. It may be necessary for your caregiver to watch this illness with blood tests either before or after diagnosis and treatment. TREATMENT  Low levels of thyroid hormone are increased by using synthetic thyroid hormone. This is a safe, effective treatment. It usually takes about four weeks to gain the full effects of the medication. After you have the full effect of the  medication, it will generally take another four weeks for problems to leave. Your caregiver may start you on low doses. If you have had heart problems the dose may be gradually increased. It is generally not an emergency to get rapidly to normal. HOME CARE INSTRUCTIONS   Take your medications as your caregiver suggests. Let your caregiver know of any medications you are taking or start taking. Your caregiver will help you with dosage schedules.  As your condition improves, your dosage needs may increase. It will be necessary to have continuing blood tests as suggested by your caregiver.  Report all suspected medication side effects to your caregiver. SEEK MEDICAL CARE IF: Seek medical care if you develop:  Sweating.  Tremulousness (tremors).  Anxiety.  Rapid weight loss.  Heat intolerance.  Emotional swings.  Diarrhea.  Weakness. SEEK IMMEDIATE MEDICAL CARE IF:  You develop chest pain, an irregular heart beat (palpitations), or a rapid heart beat. MAKE SURE YOU:   Understand these instructions.  Will watch your condition.  Will get help right away if you are not doing well or get worse. Document Released: 07/25/2005 Document Revised: 10/17/2011 Document Reviewed: 03/14/2008 [facility] Patient Information 2015 [place], [place]. This information is not intended to replace advice given to you by your health care provider. Make sure you discuss any questions you have with your health care provider.

## 2014-07-08 ENCOUNTER — Ambulatory Visit (INDEPENDENT_AMBULATORY_CARE_PROVIDER_SITE_OTHER): Payer: Medicare Other | Admitting: Pharmacist

## 2014-07-08 DIAGNOSIS — I2699 Other pulmonary embolism without acute cor pulmonale: Secondary | ICD-10-CM | POA: Diagnosis not present

## 2014-07-08 LAB — POCT INR: INR: 2.5

## 2014-07-10 ENCOUNTER — Other Ambulatory Visit: Payer: Self-pay | Admitting: General Practice

## 2014-07-10 MED ORDER — LEVOTHYROXINE SODIUM 100 MCG PO TABS: 100.0000 ug | ORAL_TABLET | Freq: Every day | ORAL | Status: DC

## 2014-07-15 ENCOUNTER — Ambulatory Visit (INDEPENDENT_AMBULATORY_CARE_PROVIDER_SITE_OTHER): Payer: Medicare Other | Admitting: Pulmonary Disease

## 2014-07-15 ENCOUNTER — Ambulatory Visit (INDEPENDENT_AMBULATORY_CARE_PROVIDER_SITE_OTHER)
Admission: RE | Admit: 2014-07-15 | Discharge: 2014-07-15 | Disposition: A | Discharge status: Home / Self Care | Payer: Medicare Other | Source: Ambulatory Visit | Attending: Pulmonary Disease | Admitting: Pulmonary Disease

## 2014-07-15 ENCOUNTER — Encounter: Payer: Self-pay | Admitting: Pulmonary Disease

## 2014-07-15 VITALS — BP 110/78 | HR 63 | Ht 69.0 in | Wt 285.2 lb

## 2014-07-15 DIAGNOSIS — J432 Centrilobular emphysema: Secondary | ICD-10-CM

## 2014-07-15 DIAGNOSIS — R0600 Dyspnea, unspecified: Secondary | ICD-10-CM

## 2014-07-15 DIAGNOSIS — I739 Peripheral vascular disease, unspecified: Secondary | ICD-10-CM

## 2014-07-15 DIAGNOSIS — L98499 Non-pressure chronic ulcer of skin of other sites with unspecified severity: Secondary | ICD-10-CM

## 2014-07-15 NOTE — Assessment & Plan Note (Addendum)
Breathing test showed unchanged lung function CXR today Breathing issues may be related to anxiety Pulmonary rehab referral I really do think bronchodilators would make her anxiety worse-and as such would not advise this at the current time

## 2014-07-15 NOTE — Assessment & Plan Note (Signed)
Continue coumadin Reassured her about low risk for  recurrence

## 2014-07-15 NOTE — Progress Notes (Signed)
Subjective:    Patient ID: [name], female    DOB: 1956-06-15, 58 y.o.   MRN: [id number]  HPI  PCP - [facility]   58 /F, ex-smoker for FU COPD & PE hospitalization for extensive bilateral pulmonary emboli causing right ventricular dysfunction in 05/2007, abnormal troponins, without evidence of coronary artery disease on catheterization. Elevated homocysteine level (69), on folate, and and vitamin B6.  Factor V Leyden was negative. Serology showed positive lupus anticoagulant. On coumadin since then    2012 - FEV1 79%  Chief Complaint  Patient presents with  . Advice Only    self referral - old patient of Dr. Elsworth Soho - having breathing difficulty; hx of blood clots in lungs; started walking and noticed she was out of breath; patient is afraid that blood clots have returned   Last seen 04/2011 Severe anxiety TSH high 44 (had quit taking synthroid) Quit smoking 2012 She reports increased dyspnea & chest tightness in stressful situations, when angry or crying. Calming down & taking deep breaths seems to relieve this. She is convinced this is anxiety but since this resembled her episode of PE she comes in for evaluation. Her chronic pain is managed by methadone and Dilaudid that she obtains from the pain clinic She admits to severe anxiety Spirometry shows ratio of 70, FEV1 of 75% and FVC of 83%-flow volume loop suggests obstruction  Past Medical History  Diagnosis Date  . Other pulmonary embolism and infarction     Significant 2008 and coumadin therapy RV dysfunction...echo...2008..EF 50%..right ventricle markedly dilated w marked right ventricular dysfunc and moder tricuspid regurg/echo..March 2010, Ef 50%, mild dilation of right ventricule w mild decrease right ventric function   . Hypopotassemia   . Tobacco use disorder   . Anemia, unspecified   . Hypotension, unspecified   . Chronic low back pain   . CAD (coronary artery disease)     minimal catheterization, October 2008    . Chest pain     with stress  . Constipation     and diarrhea chronic..Dr. Alben Spittle  . Right bundle branch block     intermittent  . Leg pain   . Back pain   . Methadone adverse reaction     for chronic leg and back pain  . Homocystinemia     signif elevation in the past...plan folic acid, B6, D53  . Lymphoproliferative disease     disorder in the past??  . Cellulitis   . Thyroid disease   . Peripheral vascular disease   . Venous insufficiency (chronic) (peripheral)     Patient's legs were wrapped 2013  . Diverticulosis of colon (without mention of hemorrhage)   . Benign neoplasm of colon 12/2007    Hyperplastic colon polyps     Review of Systems  Constitutional: Positive for appetite change, fatigue and unexpected weight change. Negative for fever.  HENT: Negative for congestion, dental problem, ear pain, nosebleeds, postnasal drip, rhinorrhea, sinus pressure, sneezing, sore throat and trouble swallowing.   Eyes: Negative for redness and itching.  Respiratory: Positive for shortness of breath. Negative for cough, chest tightness and wheezing.   Cardiovascular: Positive for leg swelling. Negative for palpitations.  Gastrointestinal: Negative for nausea and vomiting.  Genitourinary: Negative for dysuria.  Musculoskeletal: Negative for joint swelling.  Skin: Negative for rash.  Neurological: Negative for headaches.  Hematological: Does not bruise/bleed easily.  Psychiatric/Behavioral: Negative for dysphoric mood. The patient is not nervous/anxious.        Objective:  Physical Exam  Gen. Pleasant, well-nourished, in no distress ENT - no lesions, no post nasal drip Neck: No JVD, no thyromegaly, no carotid bruits Lungs: no use of accessory muscles, no dullness to percussion, clear without rales or rhonchi  Cardiovascular: Rhythm regular, heart sounds  normal, no murmurs or gallops, no peripheral edema Musculoskeletal: No deformities, no cyanosis or clubbing         Assessment & Plan:

## 2014-07-15 NOTE — Patient Instructions (Addendum)
Breathing test showed unchanged lung function CXR today Breathing issues may be related to anxiety Pulmonary rehab referral

## 2014-07-16 ENCOUNTER — Telehealth: Payer: Self-pay | Admitting: Gastroenterology

## 2014-07-16 NOTE — Telephone Encounter (Signed)
Has to take narcotics for chronic pain. Has chronic constipation. States amitiza and linzess no longer work. She has used different OTC such as MOM, Mag citrate, ect without good results. Appointment scheduled to evaluate.

## 2014-07-17 ENCOUNTER — Telehealth: Payer: Self-pay | Admitting: Pulmonary Disease

## 2014-07-17 NOTE — Telephone Encounter (Signed)
Notes Recorded by Rigoberto Noel, MD on 07/16/2014 at 10:03 AM [facility]  Called and spoke with pt and she is aware of RA recs.  Pt voiced her understanding and nothing further is needed.

## 2014-07-22 ENCOUNTER — Telehealth (HOSPITAL_COMMUNITY): Payer: Self-pay

## 2014-07-22 NOTE — Telephone Encounter (Signed)
Called patient to inquire about Pulmonary Rehab. Patient is very interested in attending rehab.  Patient wants to wait until after the start of the year for personal reasons.  Patient wants me to follow up with her then.

## 2014-07-23 DIAGNOSIS — Z1231 Encounter for screening mammogram for malignant neoplasm of breast: Secondary | ICD-10-CM | POA: Diagnosis not present

## 2014-07-23 DIAGNOSIS — Z01419 Encounter for gynecological examination (general) (routine) without abnormal findings: Secondary | ICD-10-CM | POA: Diagnosis not present

## 2014-07-24 ENCOUNTER — Other Ambulatory Visit (INDEPENDENT_AMBULATORY_CARE_PROVIDER_SITE_OTHER): Payer: Medicare Other

## 2014-07-24 ENCOUNTER — Ambulatory Visit (INDEPENDENT_AMBULATORY_CARE_PROVIDER_SITE_OTHER): Payer: Medicare Other | Admitting: Physician Assistant

## 2014-07-24 ENCOUNTER — Encounter: Payer: Self-pay | Admitting: Physician Assistant

## 2014-07-24 VITALS — BP 96/62 | HR 68 | Ht 65.5 in | Wt 286.4 lb

## 2014-07-24 DIAGNOSIS — K5900 Constipation, unspecified: Secondary | ICD-10-CM

## 2014-07-24 DIAGNOSIS — L98499 Non-pressure chronic ulcer of skin of other sites with unspecified severity: Secondary | ICD-10-CM

## 2014-07-24 DIAGNOSIS — Z86010 Personal history of colonic polyps: Secondary | ICD-10-CM

## 2014-07-24 DIAGNOSIS — E039 Hypothyroidism, unspecified: Secondary | ICD-10-CM

## 2014-07-24 DIAGNOSIS — D539 Nutritional anemia, unspecified: Secondary | ICD-10-CM

## 2014-07-24 DIAGNOSIS — I739 Peripheral vascular disease, unspecified: Secondary | ICD-10-CM

## 2014-07-24 DIAGNOSIS — K5909 Other constipation: Secondary | ICD-10-CM

## 2014-07-24 LAB — CBC WITH DIFFERENTIAL/PLATELET
BASOS PCT: 0.3 % (ref 0.0–3.0)
Basophils Absolute: 0 10*3/uL (ref 0.0–0.1)
EOS ABS: 0.1 10*3/uL (ref 0.0–0.7)
Eosinophils Relative: 2.9 % (ref 0.0–5.0)
HCT: 35.3 % — ABNORMAL LOW (ref 36.0–46.0)
Hemoglobin: 11.7 g/dL — ABNORMAL LOW (ref 12.0–15.0)
LYMPHS PCT: 25.1 % (ref 12.0–46.0)
Lymphs Abs: 1.1 10*3/uL (ref 0.7–4.0)
MCHC: 33.1 g/dL (ref 30.0–36.0)
MCV: 89.6 fl (ref 78.0–100.0)
Monocytes Absolute: 0.4 10*3/uL (ref 0.1–1.0)
Monocytes Relative: 9.3 % (ref 3.0–12.0)
NEUTROS PCT: 62.4 % (ref 43.0–77.0)
Neutro Abs: 2.8 10*3/uL (ref 1.4–7.7)
Platelets: 172 10*3/uL (ref 150.0–400.0)
RBC: 3.95 Mil/uL (ref 3.87–5.11)
RDW: 14.2 % (ref 11.5–15.5)
WBC: 4.5 10*3/uL (ref 4.0–10.5)

## 2014-07-24 LAB — TSH: TSH: 44.11 u[IU]/mL — ABNORMAL HIGH (ref 0.35–4.50)

## 2014-07-24 LAB — T4, FREE: FREE T4: 0.69 ng/dL (ref 0.60–1.60)

## 2014-07-24 NOTE — Patient Instructions (Addendum)
Please go to the basement level to have your labs drawn.   Take 1/2 dose of Miralax daily. May increase or decrease as needed. Take a probiotic daily.  Eat prunes and or figs daily.  Follow up as needed.

## 2014-07-24 NOTE — Progress Notes (Signed)
Patient ID: [name], female   DOB: [birth year], 58 y.o.   MRN: [id number]   Subjective:    Patient ID: [name], female    DOB: 1955-08-26, 58 y.o.   MRN: [id number]  HPI [name] is a pleasant 58 year old white female known to Dr. Deatra Ina. She has history of chronic constipation, she also has diverticulosis and history of hyperplastic colon polyps. Her last colonoscopy was done in 2009 with 2 hyperplastic polyps removed. She also has COPD and a positive lupus anticoagulant. She had bilateral PEs and infarct several years ago and has been on chronic Coumadin. She is narcotic dependent with history of severe back problems. She is currently on methadone and Dilaudid She was last seen here about a year and a half ago. She has tried Linzess in the past without much benefit and her last office visit was placed on Amitiza 24 g twice daily. She said both of these medications caused a lot of churning in her abdomen and gas but did not help the constipation. She has taken MiraLAX in the past but says she takes this on a daily basis she actually develops loose stools and sometimes she cannot control these. Currently she is using milk of magnesia for constipation and weights about 5 or 6 days and then picks one day each week to purge her bowels. She social habits take a few doses of MOM and then just day home. She has no current complaints of abdominal pain her appetite has been fine she has been losing weight intentionally since having her thyroid meds adjusted. He says her stools generally appeared normal but sometimes are a bit dark  at the end of a BM   Review of Systems  Pertinent positive and negative review of systems were noted in the above HPI section.  All other review of systems was otherwise negative.  Outpatient Encounter Prescriptions as of 07/24/2014  Medication Sig  . aspirin 81 MG tablet Take 81 mg by mouth daily.    Marland Kitchen b complex vitamins tablet Take 1 tablet by mouth daily.  . calcium  carbonate 200 MG capsule Take 250 mg by mouth 2 (two) times daily with a meal.  . Calcium Carbonate Antacid (ROLAIDS EXTRA STRENGTH) 1177 MG CHEW Chew 1 tablet by mouth as needed. For vomiting  . HYDROmorphone (DILAUDID) 4 MG tablet   . levothyroxine (SYNTHROID, LEVOTHROID) 100 MCG tablet Take 1 tablet (100 mcg total) by mouth daily.  . magnesium hydroxide (MILK OF MAGNESIA) 400 MG/5ML suspension Take 30 mLs by mouth as needed. For nausea  . methadone (DOLOPHINE) 10 MG tablet Take 20 mg by mouth 3 (three) times daily.   Marland Kitchen warfarin (COUMADIN) 5 MG tablet Take as directed by anticoagulation clinic   Allergies  Allergen Reactions  . Amitiza [Lubiprostone] Diarrhea and Nausea And Vomiting  . Morphine     REACTION: nausea, rash, itching, vomiting  . Nsaids     Bowel irregularity  . Venlafaxine Other (See Comments)    Reaction unknown   Patient Active Problem List   Diagnosis Date Noted  . Hyperlipidemia 06/10/2014  . Severe obesity (BMI >= 40) 06/10/2014  . Exposure to hepatitis C 06/10/2014  . CAD (coronary artery disease)   . Pain in limb-Bilateral Leg 06/27/2013  . Interstitial cystitis 01/31/2013  . Eustachian tube dysfunction 01/31/2013  . Chronic narcotic dependence 01/30/2013  . S/P hip replacement 11/01/2012  . Rectus sheath hematoma 07/13/2012  . Venous insufficiency (chronic) (peripheral)   . Chest pain, pleuritic  09/22/2011  . Urinary frequency 09/22/2011  . Atherosclerosis of native arteries of the extremities with ulceration(440.23) 07/21/2011  . Sinusitis 06/14/2011  . Esophageal spasm 06/14/2011  . Visual floaters 05/31/2011  . Neuropathic pain of lower extremity 05/31/2011  . General medical examination 05/16/2011  . Cellulitis 05/16/2011  . Constipation 05/12/2011  . 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 Hypothyroidism 01/19/2010  . LEG CRAMPS, NOCTURNAL 01/18/2010  . EDEMA 01/18/2010  . HYPOKALEMIA 08/10/2007  . ANEMIA, NORMOCYTIC 08/10/2007   . TOBACCO ABUSE 08/10/2007  . PULMONARY EMBOLISM 08/10/2007  . 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 LOW BACK PAIN, CHRONIC 08/10/2007  . MEDIASTINAL ADENOPATHY CASTLEMANS D 08/10/2007   History   Social History  . Marital Status: Divorced    Spouse Name: N/A    Number of Children: 1  . Years of Education: N/A   Occupational History  . Disabled    Social History Main Topics  . Smoking status: Former Smoker    Types: Cigarettes    Quit date: 06/09/2011  . Smokeless tobacco: Never Used     Comment: 2 ciggs a day  . Alcohol Use: No  . Drug Use: No  . Sexual Activity: Not on file   Other Topics Concern  . Not on file   Social History Narrative   Current smoker wi last 12 mos.     Ms. Mcneff family history includes Crohn's disease in her sister; Heart disease in her father and mother.      Objective:    Filed Vitals:   07/24/14 1051  BP: 96/62  Pulse: 68    Physical Exam well-developed older white female in no acute distress, pleasant height 5 foot 5 weight 286 HEENT; nontraumatic normocephalic EOMI PERRLA sclera anicteric, Supple; no JVD, Cardiovascular; regular rate and rhythm with S1-S2 no murmur or gallop, Pulmonary; clear bilaterally, Abdomen ;obese soft nontender nondistended bowel sounds are active there is no palpable mass or hepatosplenomegaly she does have a midline incisional scar, Rectal; exam not done, Extremities; no clubbing cyanosis or edema skin warm and dry, Psych; mood and affect appropriate     Assessment & Plan:   #70  58 year old female narcotic dependent with chronic constipation. She says whenever she weans herself down on her narcotics her bowels move very regularly. #2 history of hyperplastic colon polyps due for follow-up 2019 #3 diverticulosis #4 positive lupus anticoagulant with history of bilateral PEs and infarction on chronic Coumadin #5 chronic disc disease narcotic dependent #6 COPD  Plan; Long discussion with patient regarding  options for her chronic constipation. Since MiraLAX actually works for her and sometimes produces too loose of a stool she will try one half of a 17 g dose of MiraLAX each morning and then can titrate as needed. We discussed that if this is too much she can take the MiraLAX on an every other day basis. Continued liberal oral fluids and increase fiber also suggested figs her prunes daily. She is also started an OTC probiotic and we'll continue that. She will  follow up with Dr. Deatra Ina as needed. Plan follow-up colonoscopy in 2019 Check CBC  Amy S Esterwood PA-C 07/24/2014

## 2014-07-25 ENCOUNTER — Other Ambulatory Visit: Payer: Self-pay | Admitting: *Deleted

## 2014-07-25 MED ORDER — LEVOTHYROXINE SODIUM 125 MCG PO TABS: 125.0000 ug | ORAL_TABLET | Freq: Every day | ORAL | Status: DC

## 2014-07-27 NOTE — Progress Notes (Signed)
Reviewed and agree with management. Robert D. Kaplan, M.D., FACG  

## 2014-07-27 NOTE — Progress Notes (Incomplete)
Reviewed and agree with management. Colby Catanese D. Danyal Whitenack, M.D., FACG  

## 2014-08-05 ENCOUNTER — Ambulatory Visit (INDEPENDENT_AMBULATORY_CARE_PROVIDER_SITE_OTHER): Payer: Medicare Other | Admitting: *Deleted

## 2014-08-05 ENCOUNTER — Other Ambulatory Visit: Payer: Self-pay | Admitting: *Deleted

## 2014-08-05 DIAGNOSIS — I2699 Other pulmonary embolism without acute cor pulmonale: Secondary | ICD-10-CM

## 2014-08-05 LAB — POCT INR: INR: 3.8

## 2014-08-05 MED ORDER — WARFARIN SODIUM 5 MG PO TABS: ORAL_TABLET | ORAL | Status: DC

## 2014-08-08 LAB — HM PAP SMEAR

## 2014-08-08 LAB — HM MAMMOGRAPHY: HM MAMMO: NORMAL (ref 0–4)

## 2014-08-19 ENCOUNTER — Ambulatory Visit (INDEPENDENT_AMBULATORY_CARE_PROVIDER_SITE_OTHER): Payer: Medicare Other | Admitting: *Deleted

## 2014-08-19 DIAGNOSIS — I2699 Other pulmonary embolism without acute cor pulmonale: Secondary | ICD-10-CM | POA: Diagnosis not present

## 2014-08-19 LAB — POCT INR: INR: 3

## 2014-09-01 ENCOUNTER — Telehealth (HOSPITAL_COMMUNITY): Payer: Self-pay

## 2014-09-01 NOTE — Telephone Encounter (Signed)
Called patient to discuss entrance into Pulmonary Rehab.  Patient is interested in the maintenance program due to insurance reasons.  Patient needs to speak with daughter to discuss class times because [name] is responsible for c/o grandchildren during the day.

## 2014-09-11 ENCOUNTER — Ambulatory Visit (INDEPENDENT_AMBULATORY_CARE_PROVIDER_SITE_OTHER): Payer: Medicare Other | Admitting: *Deleted

## 2014-09-11 DIAGNOSIS — I2699 Other pulmonary embolism without acute cor pulmonale: Secondary | ICD-10-CM

## 2014-09-11 LAB — POCT INR: INR: 2

## 2014-09-15 ENCOUNTER — Telehealth (HOSPITAL_COMMUNITY): Payer: Self-pay

## 2014-09-15 ENCOUNTER — Telehealth: Payer: Self-pay | Admitting: Pulmonary Disease

## 2014-09-15 NOTE — Telephone Encounter (Signed)
Called and they are now closed. WCB in AM

## 2014-09-15 NOTE — Telephone Encounter (Signed)
Called patient to discuss Pulmonary Rehab.  Patient was just about to leave the house and wants to follow up with me later today.

## 2014-09-15 NOTE — Telephone Encounter (Signed)
Patient is scheduled for orientation for Pulmonary Rehab Maintenance.

## 2014-09-16 NOTE — Telephone Encounter (Signed)
Form completed and faxed.  Nothing further needed. 

## 2014-09-16 NOTE — Telephone Encounter (Signed)
Called and spoke to Elizabeth at [employer]. [name] stated she faxed over a pulmonary maintenance referral form and will need this completed by the time pt comes in for orientation to rehab on 09/19/14. Received referral form and gave to [facility] to have RA sign and be faxed back.   Will forward to [place] to follow.

## 2014-09-19 ENCOUNTER — Encounter (HOSPITAL_COMMUNITY): Payer: Self-pay

## 2014-09-19 DIAGNOSIS — J438 Other emphysema: Secondary | ICD-10-CM

## 2014-09-23 ENCOUNTER — Encounter (HOSPITAL_COMMUNITY): Admission: RE | Admit: 2014-09-23 | Payer: Self-pay | Source: Ambulatory Visit

## 2014-09-25 ENCOUNTER — Encounter (HOSPITAL_COMMUNITY)
Admission: RE | Admit: 2014-09-25 | Discharge: 2014-09-25 | Disposition: A | Discharge status: Home / Self Care | Payer: Self-pay | Source: Ambulatory Visit | Attending: Pulmonary Disease | Admitting: Pulmonary Disease

## 2014-09-25 ENCOUNTER — Encounter: Payer: Self-pay | Admitting: Internal Medicine

## 2014-09-25 ENCOUNTER — Ambulatory Visit (INDEPENDENT_AMBULATORY_CARE_PROVIDER_SITE_OTHER): Payer: Medicare Other | Admitting: Internal Medicine

## 2014-09-25 ENCOUNTER — Ambulatory Visit (INDEPENDENT_AMBULATORY_CARE_PROVIDER_SITE_OTHER): Payer: Medicare Other

## 2014-09-25 VITALS — BP 108/64 | HR 65 | Temp 98.7°F | Resp 12 | Wt 286.6 lb

## 2014-09-25 DIAGNOSIS — I2699 Other pulmonary embolism without acute cor pulmonale: Secondary | ICD-10-CM

## 2014-09-25 DIAGNOSIS — E038 Other specified hypothyroidism: Secondary | ICD-10-CM

## 2014-09-25 DIAGNOSIS — J439 Emphysema, unspecified: Secondary | ICD-10-CM | POA: Insufficient documentation

## 2014-09-25 LAB — POCT INR: INR: 3.7

## 2014-09-25 MED ORDER — LEVOTHYROXINE SODIUM 125 MCG PO TABS: 125.0000 ug | ORAL_TABLET | Freq: Every day | ORAL | Status: DC

## 2014-09-25 NOTE — Patient Instructions (Signed)
Please stop the 175 mcg Levothyroxine and start 125 mcg daily.  Please come back for labs in 5 weeks.  Take the thyroid hormone every day, with water, >30 minutes before breakfast, separated by >4 hours from acid reflux medications, calcium, iron, multivitamins.  Please come back for a follow-up appointment in 4 months.

## 2014-09-25 NOTE — Progress Notes (Signed)
[name] completed a Six-Minute Walk Test on 09/25/14 . [name] walked 795 feet with 0 breaks.  The patient's lowest oxygen saturation was 91% , highest heart rate was 92 , and highest blood pressure was 122/70. The patient was on room air. [name] stated that nothing hindered her walk test.

## 2014-09-25 NOTE — Progress Notes (Signed)
Patient ID: [name], female   DOB: May 14, 1956, 59 y.o.   MRN: [id number]   HPI  [name] is a 59 y.o.-year-old female, returning for f/u for of hypothyroidism.Last visit 3 mo ago.  Reviewed hx: Pt. has been dx with hypothyroidism in ~2011; was on Levothyroxine 100 mcg, which she stopped in 10/2013 by herself! >> TSH subsequently increased to 44 and she was feeling poorly and gained 15 lbs, as expected.  She mentioned that she stopped taking her levothyroxine in 10/2013 because her dose was increased at that time and she started to have crying spells and losing her hair. She also was very irascible. She tells me that she did not know that she could not stop the medicine completely...   She was advised by PCP to restart the LT4 but she was resistant.   She is now back on LT4 but 175 instead of 100 mcg (she had some of these tabs at home)! She tells me she ran out of the LT4 some time ago (cannot remember how long!) and she restarted to take it in the last week. At the time of the last TSH test, she was off the med.  She is taking LT4: - EVERY DAY - fasting, at 5 am - with water - separated by at least 2h from first meal - + calcium with food at bedtime - no iron - no multivitamins  - + PPI prn - at least 4h after the LT4  I reviewed pt's thyroid tests: Lab Results  Component Value Date   TSH 44.11* 07/24/2014   TSH 44.07* 06/10/2014   TSH 5.26 11/01/2013   TSH 3.73 01/31/2013   TSH 24.01* 12/19/2012   TSH 94.84* 11/01/2012   TSH 14.01* 05/16/2011   TSH 4.35 02/11/2010   TSH 7.53* 01/18/2010   FREET4 0.69 07/24/2014   FREET4 0.35* 06/10/2014   FREET4 0.87 01/31/2013   FREET4 0.86 12/19/2012   FREET4 0.69 05/16/2011   FREET4 0.86 01/22/2010    Pt describes: - + weight gain  - + fatigue - + cold and heat intolerance - + depression - +++ (opioid) constipation - no dry skin - + hair loss  Pt denies feeling nodules in neck, hoarseness, dysphagia/odynophagia,  SOB with lying down.  She has venous insufficiency. Had PE in the past >> on Coumadin.  ROS: Constitutional: + see HPI, + hot flashes, nocturia Eyes: no blurry vision, no xerophthalmia ENT: no sore throat, no nodules palpated in throat, no dysphagia/no odynophagia, no hoarseness; + tinnitus Cardiovascular: no CP/SOB/palpitations/+ leg swelling Respiratory: no cough/SOB Gastrointestinal: no N/V/D/+ C/+ heartburn Musculoskeletal: + all: muscle/joint aches Skin: no rashes, + hair loss Neurological: no tremors/numbness/tingling/dizziness  I reviewed pt's medications, allergies, PMH, social hx, family hx, and changes were documented in the history of present illness. Otherwise, unchanged from my initial visit note:  Past Medical History  Diagnosis Date  . Other pulmonary embolism and infarction     Significant 2008 and coumadin therapy RV dysfunction...echo...2008..EF 50%..right ventricle markedly dilated w marked right ventricular dysfunc and moder tricuspid regurg/echo..March 2010, Ef 50%, mild dilation of right ventricule w mild decrease right ventric function   . Hypopotassemia   . Tobacco use disorder   . Anemia, unspecified   . Hypotension, unspecified   . Chronic low back pain   . CAD (coronary artery disease)     minimal catheterization, October 2008  . Chest pain     with stress  . Constipation     and  diarrhea chronic..Dr. Alben Spittle  . Right bundle branch block     intermittent  . Leg pain   . Back pain   . Methadone adverse reaction     for chronic leg and back pain  . Homocystinemia     signif elevation in the past...plan folic acid, B6, A41  . Lymphoproliferative disease     disorder in the past??  . Cellulitis   . Thyroid disease   . Peripheral vascular disease   . Venous insufficiency (chronic) (peripheral)     Patient's legs were wrapped 2013  . Diverticulosis of colon (without mention of hemorrhage)   . Benign neoplasm of colon 12/2007    Hyperplastic  colon polyps   Past Surgical History  Procedure Laterality Date  . Cholecystectomy    . Gastric bypass    . Tonsillectomy    . Joint replacement      right total hip replacement  . Rotator cuff repair      right shoulder   History   Social History  . Marital Status: Divorced    Spouse Name: N/A    Number of Children: 1   Occupational History  . Disabled    Social History Main Topics  . Smoking status: Former Smoker    Types: Cigarettes    Quit date: 06/09/2011  . Smokeless tobacco: Never Used     Comment: 2 ciggs a day  . Alcohol Use: No  . Drug Use: No   Social History Narrative   Current smoker wi last 12 mos.    Current Outpatient Prescriptions on File Prior to Visit  Medication Sig Dispense Refill  . aspirin 81 MG tablet Take 81 mg by mouth daily.      Marland Kitchen b complex vitamins tablet Take 1 tablet by mouth daily.    . calcium carbonate 200 MG capsule Take 250 mg by mouth 2 (two) times daily with a meal.    . Calcium Carbonate Antacid (ROLAIDS EXTRA STRENGTH) 1177 MG CHEW Chew 1 tablet by mouth as needed. For vomiting    . HYDROmorphone (DILAUDID) 4 MG tablet   0  . levothyroxine (SYNTHROID, LEVOTHROID) 125 MCG tablet Take 1 tablet (125 mcg total) by mouth daily. 60 tablet 1  . magnesium hydroxide (MILK OF MAGNESIA) 400 MG/5ML suspension Take 30 mLs by mouth as needed. For nausea    . methadone (DOLOPHINE) 10 MG tablet Take 20 mg by mouth 3 (three) times daily.     Marland Kitchen warfarin (COUMADIN) 5 MG tablet Take as directed by anticoagulation clinic 180 tablet 1   No current facility-administered medications on file prior to visit.   Allergies  Allergen Reactions  . Amitiza [Lubiprostone] Diarrhea and Nausea And Vomiting  . Morphine     REACTION: nausea, rash, itching, vomiting  . Nsaids     Bowel irregularity  . Venlafaxine Other (See Comments)    Reaction unknown   Family History  Problem Relation Age of Onset  . Crohn's disease Sister   . Heart disease Mother    . Heart disease Father    PE: BP 108/64 mmHg  Pulse 65  Temp(Src) 98.7 F (37.1 C) (Oral)  Resp 12  Wt 286 lb 9.6 oz (130.001 kg)  SpO2 95% Wt Readings from Last 3 Encounters:  09/25/14 286 lb 9.6 oz (130.001 kg)  09/19/14 290 lb 2 oz (131.6 kg)  07/24/14 286 lb 6 oz (129.899 kg)   Constitutional: overweight, in NAD Eyes: PERRLA, EOMI, no exophthalmos ENT:  moist mucous membranes, no thyromegaly, no cervical lymphadenopathy Cardiovascular: RRR, No MRG Respiratory: CTA B Gastrointestinal: abdomen soft, NT, ND, BS+ Musculoskeletal: no deformities, strength intact in all 4 Skin: moist, warm, no rashes Neurological: no tremor with outstretched hands, DTR normal in all 4  ASSESSMENT: 1. Hypothyroidism - intermittently compliant with levothyroxine  PLAN:  1. Patient with long-standing hypothyroidism, noncompliant with levothyroxine. She does not appear to have a goiter, thyroid nodules, or neck compression symptoms.  - we again discussed about the importance of taking the levothyroxine every day so that she can start feeling better - We discussed about correct intake of levothyroxine, fasting, with water, separated by at least 30 minutes from breakfast, and separated by more than 4 hours from calcium, iron, multivitamins, acid reflux medications (PPIs). She apparently takes it correctly. - she started 175 mcg LT4 (instead of 125 mcg) 1 week ago >> we cannot check tests today - will check thyroid tests in 1.5 mo  Patient Instructions  Please stop the 175 mcg Levothyroxine and start 125 mcg daily.  Please come back for labs in 5 weeks.  Take the thyroid hormone every day, with water, >30 minutes before breakfast, separated by >4 hours from acid reflux medications, calcium, iron, multivitamins.  Please come back for a follow-up appointment in 4 months.  - If these are abnormal, she will need to return in 6-8 weeks for repeat labs - If these are normal, I will see her back in 4  months  - time spent with the patient: 25 min, of which >50% was spent in reviewing her thyroid labs and counseling her about the need to take LT4 daily and possible consequences of not doing so. We also spend time discussing correct adm. of LT4.

## 2014-09-30 ENCOUNTER — Encounter (HOSPITAL_COMMUNITY)
Admission: RE | Admit: 2014-09-30 | Discharge: 2014-09-30 | Disposition: A | Discharge status: Home / Self Care | Payer: Self-pay | Source: Ambulatory Visit | Attending: Pulmonary Disease | Admitting: Pulmonary Disease

## 2014-10-02 ENCOUNTER — Encounter (HOSPITAL_COMMUNITY)
Admission: RE | Admit: 2014-10-02 | Discharge: 2014-10-02 | Disposition: A | Discharge status: Home / Self Care | Payer: Self-pay | Source: Ambulatory Visit | Attending: Pulmonary Disease | Admitting: Pulmonary Disease

## 2014-10-07 ENCOUNTER — Encounter (HOSPITAL_COMMUNITY)
Admission: RE | Admit: 2014-10-07 | Discharge: 2014-10-07 | Disposition: A | Discharge status: Home / Self Care | Payer: Self-pay | Source: Ambulatory Visit | Attending: Pulmonary Disease | Admitting: Pulmonary Disease

## 2014-10-07 DIAGNOSIS — J439 Emphysema, unspecified: Secondary | ICD-10-CM | POA: Insufficient documentation

## 2014-10-09 ENCOUNTER — Encounter (HOSPITAL_COMMUNITY): Payer: Self-pay

## 2014-10-09 ENCOUNTER — Ambulatory Visit (INDEPENDENT_AMBULATORY_CARE_PROVIDER_SITE_OTHER): Payer: Medicare Other | Admitting: *Deleted

## 2014-10-09 DIAGNOSIS — I2699 Other pulmonary embolism without acute cor pulmonale: Secondary | ICD-10-CM

## 2014-10-09 LAB — POCT INR: INR: 2.4

## 2014-10-14 ENCOUNTER — Encounter (HOSPITAL_COMMUNITY)
Admission: RE | Admit: 2014-10-14 | Discharge: 2014-10-14 | Disposition: A | Discharge status: Home / Self Care | Payer: Self-pay | Source: Ambulatory Visit | Attending: Pulmonary Disease | Admitting: Pulmonary Disease

## 2014-10-14 ENCOUNTER — Telehealth: Payer: Self-pay | Admitting: *Deleted

## 2014-10-14 NOTE — Telephone Encounter (Signed)
Patient called in to report that she has had some breakdown in the skin over her left medial malleolus;  She says that it is a dime-sized wound that seems superficial. Patient reports that she has chronic venous insufficiency. She has seen both Dr. Oneida Alar and Vinnie Level Nickel, NP for left leg cellulitis this last year. She has a history of PE and is being seen at pulmonary rehab. She has been afebrile but reports the nurse at rehab, today thought she had some warmth over the ulcer. She has been cleaning the wound, applying Bacitracin, using elevation and has been applying ACE wrap to the leg for compression. She states that regular compression hose are too hot. I have reviewed the S&S of DVT and asked our appt secretary to make her an appt with either Dr. Oneida Alar or Vinnie Level in the near future. If this area gets worse or she has any signs of blood clot , chest pain, or SOB, she will go to the ED for evaluation.

## 2014-10-14 NOTE — Progress Notes (Signed)
Pt at pulmonary rehab maintenance c/o left leg ulceration.  Upon inspection, less than 1 cm area ulcerated yellow area with approximately 2-3 inch pink area surrounding, warm to touch. Pt denies pain.   Some weeping evident.  Pt reports she is cleaning area daily with soap and water and applying bacitracin ointment.  Pt reports she is using ace wrap however unable to wear compression stockings.  Pt reports compression stockings  limit her activities and make her "hot".   Pt is followed for chronic venous stasis by Dr. Oneida Alar. Pt advised to contact Dr. Oneida Alar office for appointment.  Pt also advised to keep wound clean and dry, keeping legs elevated.  Pt verbalized understanding

## 2014-10-16 ENCOUNTER — Encounter (HOSPITAL_COMMUNITY)
Admission: RE | Admit: 2014-10-16 | Discharge: 2014-10-16 | Disposition: A | Discharge status: Home / Self Care | Payer: Self-pay | Source: Ambulatory Visit | Attending: Pulmonary Disease | Admitting: Pulmonary Disease

## 2014-10-21 ENCOUNTER — Encounter: Payer: Self-pay | Admitting: Vascular Surgery

## 2014-10-21 ENCOUNTER — Encounter (HOSPITAL_COMMUNITY): Payer: Self-pay

## 2014-10-22 ENCOUNTER — Ambulatory Visit (INDEPENDENT_AMBULATORY_CARE_PROVIDER_SITE_OTHER): Payer: Medicare Other | Admitting: Vascular Surgery

## 2014-10-22 ENCOUNTER — Other Ambulatory Visit: Payer: Self-pay | Admitting: *Deleted

## 2014-10-22 ENCOUNTER — Ambulatory Visit (HOSPITAL_COMMUNITY)
Admission: RE | Admit: 2014-10-22 | Discharge: 2014-10-22 | Disposition: A | Discharge status: Home / Self Care | Payer: Medicare Other | Source: Ambulatory Visit | Attending: Vascular Surgery | Admitting: Vascular Surgery

## 2014-10-22 ENCOUNTER — Encounter: Payer: Self-pay | Admitting: Vascular Surgery

## 2014-10-22 VITALS — BP 101/70 | HR 59 | Resp 16 | Ht 68.0 in | Wt 289.0 lb

## 2014-10-22 DIAGNOSIS — I83019 Varicose veins of right lower extremity with ulcer of unspecified site: Secondary | ICD-10-CM

## 2014-10-22 DIAGNOSIS — I83018 Varicose veins of right lower extremity with ulcer other part of lower leg: Secondary | ICD-10-CM

## 2014-10-22 DIAGNOSIS — I83022 Varicose veins of left lower extremity with ulcer of calf: Secondary | ICD-10-CM

## 2014-10-22 DIAGNOSIS — I83023 Varicose veins of left lower extremity with ulcer of ankle: Secondary | ICD-10-CM

## 2014-10-22 DIAGNOSIS — IMO0001 Reserved for inherently not codable concepts without codable children: Secondary | ICD-10-CM

## 2014-10-22 DIAGNOSIS — I83014 Varicose veins of right lower extremity with ulcer of heel and midfoot: Secondary | ICD-10-CM

## 2014-10-22 DIAGNOSIS — I83011 Varicose veins of right lower extremity with ulcer of thigh: Secondary | ICD-10-CM | POA: Diagnosis not present

## 2014-10-22 DIAGNOSIS — I83021 Varicose veins of left lower extremity with ulcer of thigh: Secondary | ICD-10-CM | POA: Diagnosis not present

## 2014-10-22 DIAGNOSIS — I872 Venous insufficiency (chronic) (peripheral): Secondary | ICD-10-CM

## 2014-10-22 DIAGNOSIS — I83025 Varicose veins of left lower extremity with ulcer other part of foot: Secondary | ICD-10-CM

## 2014-10-22 DIAGNOSIS — L97901 Non-pressure chronic ulcer of unspecified part of unspecified lower leg limited to breakdown of skin: Secondary | ICD-10-CM

## 2014-10-22 DIAGNOSIS — I83009 Varicose veins of unspecified lower extremity with ulcer of unspecified site: Secondary | ICD-10-CM

## 2014-10-22 DIAGNOSIS — I83024 Varicose veins of left lower extremity with ulcer of heel and midfoot: Secondary | ICD-10-CM

## 2014-10-22 DIAGNOSIS — I83012 Varicose veins of right lower extremity with ulcer of calf: Secondary | ICD-10-CM

## 2014-10-22 DIAGNOSIS — I83015 Varicose veins of right lower extremity with ulcer other part of foot: Secondary | ICD-10-CM

## 2014-10-22 DIAGNOSIS — I83028 Varicose veins of left lower extremity with ulcer other part of lower leg: Secondary | ICD-10-CM

## 2014-10-22 DIAGNOSIS — I83029 Varicose veins of left lower extremity with ulcer of unspecified site: Secondary | ICD-10-CM

## 2014-10-22 DIAGNOSIS — I83013 Varicose veins of right lower extremity with ulcer of ankle: Secondary | ICD-10-CM

## 2014-10-22 NOTE — Progress Notes (Signed)
  [name] is a 59 y.o. female who presents with chief complaint: swollen leg with ulceration.  She has also had multiple prior exacerbations and remissions of ulcerations since her prior DVT in 2008. We have followed her intermittently since 2012. Her most recent episode was several years ago. The patient has had a history of DVT and pulmonary embolus. She is on chronic coumadin. There is a family history of varicose veins. The patient has used compression stockings in the past but has not worn them recently because she states her legs get too hot. She does wear Ace wraps.. She continues to be non-compliant with compression stockings.  She recently has developed 2 ulcerations on her lower extremities and these are all in various states of healing  Review of systems: She denies shortness of breath. She denies chest pain.  Physical exam:  Filed Vitals:   10/22/14 1558  BP: 101/70  Pulse: 59  Resp: 16  Height: 5\' 8"  (1.727 m)  Weight: 289 lb (131.09 kg)    Lower extremities:  2 mm ulcerations just above medial malleolus bilaterally with brawny staining gaiter area bilaterally, pink warm and well-perfused feet, 2+ dorsalis pedis pulses bilaterally  Assessment: Recurrent venous stasis ulcers, known deep and superficial incompetence  Plan:  The ulcers are currently fairly superficial a believe this should continue to heal with just Ace wraps. The patient states they have improved over the last few days. If the ulcers get worse we'll consider an Unna boot therapy again. The patient will follow-up with [place] in a few weeks if the ulcers have not healed. Otherwise she'll follow-up on as-needed basis.  Ruta Hinds, MD [facility] Office: [phone] Pager: [phone]

## 2014-10-23 ENCOUNTER — Encounter (HOSPITAL_COMMUNITY): Payer: Self-pay

## 2014-10-24 ENCOUNTER — Ambulatory Visit (INDEPENDENT_AMBULATORY_CARE_PROVIDER_SITE_OTHER): Payer: Medicare Other | Admitting: *Deleted

## 2014-10-24 ENCOUNTER — Ambulatory Visit (INDEPENDENT_AMBULATORY_CARE_PROVIDER_SITE_OTHER): Payer: Medicare Other | Admitting: Cardiology

## 2014-10-24 ENCOUNTER — Ambulatory Visit: Payer: Medicare Other | Admitting: Cardiology

## 2014-10-24 ENCOUNTER — Encounter: Payer: Self-pay | Admitting: Cardiology

## 2014-10-24 VITALS — BP 118/74 | HR 58 | Ht 68.0 in | Wt 290.0 lb

## 2014-10-24 DIAGNOSIS — Z86711 Personal history of pulmonary embolism: Secondary | ICD-10-CM

## 2014-10-24 DIAGNOSIS — I872 Venous insufficiency (chronic) (peripheral): Secondary | ICD-10-CM | POA: Diagnosis not present

## 2014-10-24 DIAGNOSIS — I2510 Atherosclerotic heart disease of native coronary artery without angina pectoris: Secondary | ICD-10-CM

## 2014-10-24 DIAGNOSIS — I2699 Other pulmonary embolism without acute cor pulmonale: Secondary | ICD-10-CM | POA: Diagnosis not present

## 2014-10-24 LAB — POCT INR: INR: 2.4

## 2014-10-24 NOTE — Assessment & Plan Note (Signed)
Patient has a history of significant pulmonary emboli in the past. She remains on chronic Coumadin. She is stable. No further workup.

## 2014-10-24 NOTE — Assessment & Plan Note (Addendum)
The patient had minimal coronary disease in the past. No further workup. She has been on aspirin in the past. She is on long-term Coumadin for her history of pulmonary emboli. Guidelines have changed concerning this. I will stop her aspirin today. I discussed the possibility of using a statin but the patient. She says that she had diffuse aches and pains on one of the statins and that it was stopped. Plans were to consider a different statin in the future. This is being overseen by her primary team. The patient dosage discussed this further with her primary team when she is seen in follow-up.  The patient is aware that I will be retiring at the end of September, 2016. She knows that she will have a new cardiologist assigned to take over her care.

## 2014-10-24 NOTE — Progress Notes (Signed)
Cardiology Office Note   Date:  10/24/2014   ID:  [name], DOB [birth year], MRN [id number]  PCP:  Annye Asa, MD  Cardiologist:  Dola Argyle, MD   Chief Complaint  Patient presents with  . Appointment    Follow-up history of coronary disease and pulmonary emboli      History of Present Illness: [name] is a 59 y.o. female who presents today to follow-up with history of pulmonary emboli. She is receiving excellent care from the pulmonary team and vascular surgery team. She goes to pulmonary rehabilitation on a regular basis and is doing well. Historically she has mild coronary disease. She had significant pulmonary emboli after orthopedic surgery in the past. She had right ventricular dysfunction that improved. She is doing well at this time. She's not having any chest pain. I saw her last in the office March, 2015.    Past Medical History  Diagnosis Date  . Other pulmonary embolism and infarction     Significant 2008 and coumadin therapy RV dysfunction...echo...2008..EF 50%..right ventricle markedly dilated w marked right ventricular dysfunc and moder tricuspid regurg/echo..March 2010, Ef 50%, mild dilation of right ventricule w mild decrease right ventric function   . Hypopotassemia   . Tobacco use disorder   . Anemia, unspecified   . Hypotension, unspecified   . Chronic low back pain   . CAD (coronary artery disease)     minimal catheterization, October 2008  . Chest pain     with stress  . Constipation     and diarrhea chronic..Dr. Alben Spittle  . Right bundle branch block     intermittent  . Leg pain   . Back pain   . Methadone adverse reaction     for chronic leg and back pain  . Homocystinemia     signif elevation in the past...plan folic acid, B6, T26  . Lymphoproliferative disease     disorder in the past??  . Cellulitis   . Thyroid disease   . Peripheral vascular disease   . Venous insufficiency (chronic) (peripheral)     Patient's legs  were wrapped 2013  . Diverticulosis of colon (without mention of hemorrhage)   . Benign neoplasm of colon 12/2007    Hyperplastic colon polyps    Past Surgical History  Procedure Laterality Date  . Cholecystectomy    . Gastric bypass    . Tonsillectomy    . Joint replacement      right total hip replacement  . Rotator cuff repair      right shoulder    Patient Active Problem List   Diagnosis Date Noted  . Hyperlipidemia 06/10/2014  . Severe obesity (BMI >= 40) 06/10/2014  . Exposure to hepatitis C 06/10/2014  . CAD (coronary artery disease)   . Pain in limb-Bilateral Leg 06/27/2013  . Interstitial cystitis 01/31/2013  . Eustachian tube dysfunction 01/31/2013  . Chronic narcotic dependence 01/30/2013  . S/P hip replacement 11/01/2012  . Rectus sheath hematoma 07/13/2012  . Venous insufficiency (chronic) (peripheral)   . Chest pain, pleuritic 09/22/2011  . Urinary frequency 09/22/2011  . Atherosclerosis of native arteries of the extremities with ulceration(440.23) 07/21/2011  . Sinusitis 06/14/2011  . Esophageal spasm 06/14/2011  . Visual floaters 05/31/2011  . Neuropathic pain of lower extremity 05/31/2011  . General medical examination 05/16/2011  . Cellulitis 05/16/2011  . Constipation 05/12/2011  . 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 Hypothyroidism 01/19/2010  . LEG CRAMPS, NOCTURNAL 01/18/2010  . EDEMA  01/18/2010  . HYPOKALEMIA 08/10/2007  . ANEMIA, NORMOCYTIC 08/10/2007  . TOBACCO ABUSE 08/10/2007  . PULMONARY EMBOLISM 08/10/2007  . 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 LOW BACK PAIN, CHRONIC 08/10/2007  . MEDIASTINAL ADENOPATHY CASTLEMANS D 08/10/2007      Current Outpatient Prescriptions  Medication Sig Dispense Refill  . aspirin 81 MG tablet Take 81 mg by mouth daily.      Marland Kitchen b complex vitamins tablet Take 1 tablet by mouth daily.    . calcium carbonate 200 MG capsule Take 250 mg by mouth 2 (two) times daily with a meal.    . Calcium  Carbonate Antacid (ROLAIDS EXTRA STRENGTH) 1177 MG CHEW Chew 1 tablet by mouth as needed. For vomiting    . HYDROmorphone (DILAUDID) 4 MG tablet   0  . levothyroxine (SYNTHROID, LEVOTHROID) 125 MCG tablet Take 1 tablet (125 mcg total) by mouth daily. 60 tablet 1  . magnesium hydroxide (MILK OF MAGNESIA) 400 MG/5ML suspension Take 30 mLs by mouth as needed. For nausea    . methadone (DOLOPHINE) 10 MG tablet Take 20 mg by mouth 3 (three) times daily.     Marland Kitchen warfarin (COUMADIN) 5 MG tablet Take as directed by anticoagulation clinic 180 tablet 1   No current facility-administered medications for this visit.    Allergies:   Amitiza; Morphine; Nsaids; and Venlafaxine    Social History:  The patient  reports that she quit smoking about 3 years ago. Her smoking use included Cigarettes. She has never used smokeless tobacco. She reports that she does not drink alcohol or use illicit drugs.   Family History:  The patient's family history includes Crohn's disease in her sister; Heart disease in her father and mother.    ROS:  Please see the history of present illness.     Patient denies fever, chills, headache, sweats, rash, change in vision, change in hearing, chest pain, cough, nausea or vomiting, urinary symptoms. All other systems are reviewed and are negative.   PHYSICAL EXAM: VS:  BP 118/74 mmHg  Pulse 58  Ht 5\' 8"  (1.727 m)  Wt 290 lb (131.543 kg)  BMI 44.10 kg/m2  SpO2 99% , Patient is overweight. She is stable. Head is atraumatic. Sclera and conjunctiva are normal. There is no jugular venous distention. Lungs are clear. Respiratory effort is not labored. Cardiac exam reveals an S1 and S2. The abdomen is obese but soft. There is no peripheral edema. She has significant venous changes and her legs. Other than today she wraps her legs every day. There are no musculoskeletal deformities. There are skin changes in her legs related to her venous disease.  EKG:   EKG is done today and reviewed by  me. There is no change from the past. There is sinus rhythm. There are only nonspecific ST-T wave changes.   Recent Labs: 06/10/2014: ALT 17; BUN 7; Creatinine 0.9; Potassium 3.4*; Sodium 138 07/24/2014: Hemoglobin 11.7*; Platelets 172.0; TSH 44.11*    Lipid Panel    Component Value Date/Time   CHOL 124 06/10/2014 0942   TRIG 107.0 06/10/2014 0942   HDL 25.20* 06/10/2014 0942   CHOLHDL 5 06/10/2014 0942   VLDL 21.4 06/10/2014 0942   LDLCALC 77 06/10/2014 0942      Wt Readings from Last 3 Encounters:  10/24/14 290 lb (131.543 kg)  10/22/14 289 lb (131.09 kg)  09/25/14 286 lb 9.6 oz (130.001 kg)      Current medicines are reviewed  The patient understands her meds.  ASSESSMENT AND PLAN:

## 2014-10-24 NOTE — Assessment & Plan Note (Signed)
This is followed very carefully by vascular surgery. No further workup.

## 2014-10-24 NOTE — Patient Instructions (Signed)
Your physician has recommended you make the following change in your medication: stop taking Aspirin  Your physician wants you to follow-up in: 1 year. You will receive a reminder letter in the mail two months in advance. If you don't receive a letter, please call our office to schedule the follow-up appointment.

## 2014-10-28 ENCOUNTER — Encounter (HOSPITAL_COMMUNITY): Payer: Self-pay

## 2014-10-30 ENCOUNTER — Encounter (HOSPITAL_COMMUNITY): Payer: Self-pay

## 2014-11-04 ENCOUNTER — Encounter (HOSPITAL_COMMUNITY)
Admission: RE | Admit: 2014-11-04 | Discharge: 2014-11-04 | Disposition: A | Discharge status: Home / Self Care | Payer: Self-pay | Source: Ambulatory Visit | Attending: Pulmonary Disease | Admitting: Pulmonary Disease

## 2014-11-06 ENCOUNTER — Encounter (HOSPITAL_COMMUNITY)
Admission: RE | Admit: 2014-11-06 | Discharge: 2014-11-06 | Disposition: A | Discharge status: Home / Self Care | Payer: Self-pay | Source: Ambulatory Visit | Attending: Pulmonary Disease | Admitting: Pulmonary Disease

## 2014-11-11 ENCOUNTER — Telehealth: Payer: Self-pay | Admitting: Family Medicine

## 2014-11-11 ENCOUNTER — Encounter (HOSPITAL_COMMUNITY)
Admission: RE | Admit: 2014-11-11 | Discharge: 2014-11-11 | Disposition: A | Discharge status: Home / Self Care | Payer: Self-pay | Source: Ambulatory Visit | Attending: Pulmonary Disease | Admitting: Pulmonary Disease

## 2014-11-11 DIAGNOSIS — J439 Emphysema, unspecified: Secondary | ICD-10-CM | POA: Insufficient documentation

## 2014-11-11 NOTE — Telephone Encounter (Signed)
Pre visit letter sent  °

## 2014-11-13 ENCOUNTER — Encounter (HOSPITAL_COMMUNITY): Payer: Self-pay

## 2014-11-18 ENCOUNTER — Encounter (HOSPITAL_COMMUNITY): Payer: Self-pay

## 2014-11-20 ENCOUNTER — Encounter (HOSPITAL_COMMUNITY): Payer: Self-pay

## 2014-11-25 ENCOUNTER — Encounter (HOSPITAL_COMMUNITY): Payer: Self-pay

## 2014-11-27 ENCOUNTER — Encounter (HOSPITAL_COMMUNITY): Payer: Self-pay

## 2014-12-02 ENCOUNTER — Telehealth: Payer: Self-pay | Admitting: *Deleted

## 2014-12-02 ENCOUNTER — Encounter (HOSPITAL_COMMUNITY): Payer: Self-pay

## 2014-12-02 NOTE — Telephone Encounter (Signed)
Patient cancelled appointment.

## 2014-12-02 NOTE — Telephone Encounter (Signed)
Unable to reach patient at time of Pre-Visit Call.  Left message for patient to return call when available.    

## 2014-12-03 ENCOUNTER — Encounter: Payer: Medicare Other | Admitting: Family Medicine

## 2014-12-04 ENCOUNTER — Encounter (HOSPITAL_COMMUNITY): Payer: Self-pay

## 2014-12-09 ENCOUNTER — Encounter (HOSPITAL_COMMUNITY): Payer: Self-pay | Attending: Pulmonary Disease

## 2014-12-09 DIAGNOSIS — J439 Emphysema, unspecified: Secondary | ICD-10-CM | POA: Insufficient documentation

## 2014-12-11 ENCOUNTER — Encounter (HOSPITAL_COMMUNITY): Payer: Self-pay

## 2014-12-16 ENCOUNTER — Encounter (HOSPITAL_COMMUNITY): Admission: RE | Admit: 2014-12-16 | Payer: Self-pay | Source: Ambulatory Visit

## 2015-01-14 ENCOUNTER — Encounter: Payer: Self-pay | Admitting: Adult Health

## 2015-01-14 ENCOUNTER — Ambulatory Visit (INDEPENDENT_AMBULATORY_CARE_PROVIDER_SITE_OTHER): Payer: Medicare Other | Admitting: Adult Health

## 2015-01-14 VITALS — BP 118/70 | HR 65 | Temp 98.2°F | Ht 68.0 in | Wt 291.8 lb

## 2015-01-14 DIAGNOSIS — Z86711 Personal history of pulmonary embolism: Secondary | ICD-10-CM

## 2015-01-14 DIAGNOSIS — J439 Emphysema, unspecified: Secondary | ICD-10-CM

## 2015-01-14 DIAGNOSIS — I2510 Atherosclerotic heart disease of native coronary artery without angina pectoris: Secondary | ICD-10-CM | POA: Diagnosis not present

## 2015-01-14 NOTE — Patient Instructions (Signed)
Keep up good work  follow up Dr. Elsworth Soho  In 6 months and As needed

## 2015-01-19 NOTE — Progress Notes (Signed)
   Subjective:    Patient ID: [name], female    DOB: [birth year], 59 y.o.   MRN: [id number]  HPI  PCP - [place]   58 /F, ex-smoker for FU COPD & PE hospitalization for extensive bilateral pulmonary emboli causing right ventricular dysfunction in 05/2007, abnormal troponins, without evidence of coronary artery disease on catheterization. Elevated homocysteine level (69), on folate, and and vitamin B6.  Factor V Leyden was negative. Serology showed positive lupus anticoagulant. On coumadin since then    2012 - FEV1 79%  Chief Complaint  Patient presents with  . Advice Only    self referral - old patient of Dr. Elsworth Soho - having breathing difficulty; hx of blood clots in lungs; started walking and noticed she was out of breath; patient is afraid that blood clots have returned   Last seen 04/2011 Severe anxiety TSH high 44 (had quit taking synthroid) Quit smoking 2012 She reports increased dyspnea & chest tightness in stressful situations, when angry or crying. Calming down & taking deep breaths seems to relieve this. She is convinced this is anxiety but since this resembled her episode of PE she comes in for evaluation. Her chronic pain is managed by methadone and Dilaudid that she obtains from the pain clinic She admits to severe anxiety Spirometry shows ratio of 70, FEV1 of 75% and FVC of 83%-flow volume loop suggests obstruction >> No changes  01/19/2015 Follow up : COPD and PE Patient returns for a follow-up. Physical she is been doing very well without any flare of cough, wheezing or shortness of breath. Previous spirometry showed preserved FEV1. She is on no maintenance inhalers. Remains a pulmonary rehabilitation.  Remains on coumadin . Denies bleeding.   Having a lot of family stress.  She denies any chest pain, orthopnea, PND or increased leg swelling.   Review of Systems  Constitutional:   No  weight loss, night sweats,  Fevers, chills, fatigue, or   lassitude.  HEENT:   No headaches,  Difficulty swallowing,  Tooth/dental problems, or  Sore throat,                No sneezing, itching, ear ache, nasal congestion, post nasal drip,   CV:  No chest pain,  Orthopnea, PND, swelling in lower extremities, anasarca, dizziness, palpitations, syncope.   GI  No heartburn, indigestion, abdominal pain, nausea, vomiting, diarrhea, change in bowel habits, loss of appetite, bloody stools.   Resp:.  No excess mucus, no productive cough,  No non-productive cough,  No coughing up of blood.  No change in color of mucus.  No wheezing.  No chest wall deformity  Skin: no rash or lesions.  GU: no dysuria, change in color of urine, no urgency or frequency.  No flank pain, no hematuria   MS:  No joint pain or swelling.  No decreased range of motion.  No back pain.  Psych:  No change in mood or affect. No depression or anxiety.  No memory loss.          Objective:   Physical Exam  Gen. Pleasant, well-nourished, in no distress ENT - no lesions, no post nasal drip Neck: No JVD, no thyromegaly, no carotid bruits Lungs: no use of accessory muscles, no dullness to percussion, clear without rales or rhonchi  Cardiovascular: Rhythm regular, heart sounds  normal, no murmurs or gallops, no peripheral edema Musculoskeletal: No deformities, no cyanosis or clubbing        Assessment & Plan:

## 2015-01-19 NOTE — Assessment & Plan Note (Signed)
Compensated without maintenance regimen

## 2015-01-19 NOTE — Assessment & Plan Note (Signed)
PE, on chronic Coumadin  Continue on current regimen  Late add: Patient missed her Coumadin check and is been advised to follow-up the Coumadin clinic for monitoring.

## 2015-01-21 ENCOUNTER — Ambulatory Visit (INDEPENDENT_AMBULATORY_CARE_PROVIDER_SITE_OTHER): Payer: Medicare Other | Admitting: *Deleted

## 2015-01-21 DIAGNOSIS — I2699 Other pulmonary embolism without acute cor pulmonale: Secondary | ICD-10-CM

## 2015-01-21 DIAGNOSIS — Z86711 Personal history of pulmonary embolism: Secondary | ICD-10-CM

## 2015-01-21 LAB — POCT INR: INR: 2.7

## 2015-01-23 ENCOUNTER — Telehealth: Payer: Self-pay | Admitting: General Practice

## 2015-01-23 NOTE — Telephone Encounter (Signed)
Called pt and LMOVM to find out when was last mammogram? Where was it completed?

## 2015-01-26 NOTE — Telephone Encounter (Signed)
Pt states she had a mammogram at [facility] this year.

## 2015-01-26 NOTE — Telephone Encounter (Signed)
Note in chart, spoke with pt advised around 09/11/14.

## 2015-02-06 ENCOUNTER — Telehealth: Payer: Self-pay | Admitting: Endocrinology

## 2015-02-10 ENCOUNTER — Ambulatory Visit: Payer: Medicare Other | Admitting: Internal Medicine

## 2015-02-10 NOTE — Telephone Encounter (Signed)
error 

## 2015-02-11 ENCOUNTER — Ambulatory Visit (INDEPENDENT_AMBULATORY_CARE_PROVIDER_SITE_OTHER): Payer: Medicare Other | Admitting: *Deleted

## 2015-02-11 DIAGNOSIS — I2699 Other pulmonary embolism without acute cor pulmonale: Secondary | ICD-10-CM

## 2015-02-11 DIAGNOSIS — Z86711 Personal history of pulmonary embolism: Secondary | ICD-10-CM | POA: Diagnosis not present

## 2015-02-11 LAB — POCT INR: INR: 2.2

## 2015-02-27 ENCOUNTER — Other Ambulatory Visit: Payer: Self-pay | Admitting: Cardiology

## 2015-03-22 IMAGING — CR DG CHEST 2V
2 series · 2 of 2 positions shown · non-contrast
Comparison: 09/22/2011

CLINICAL DATA: Dyspnea on exertion

EXAM:
CHEST  2 VIEW

[view not recorded (1 of 2)]
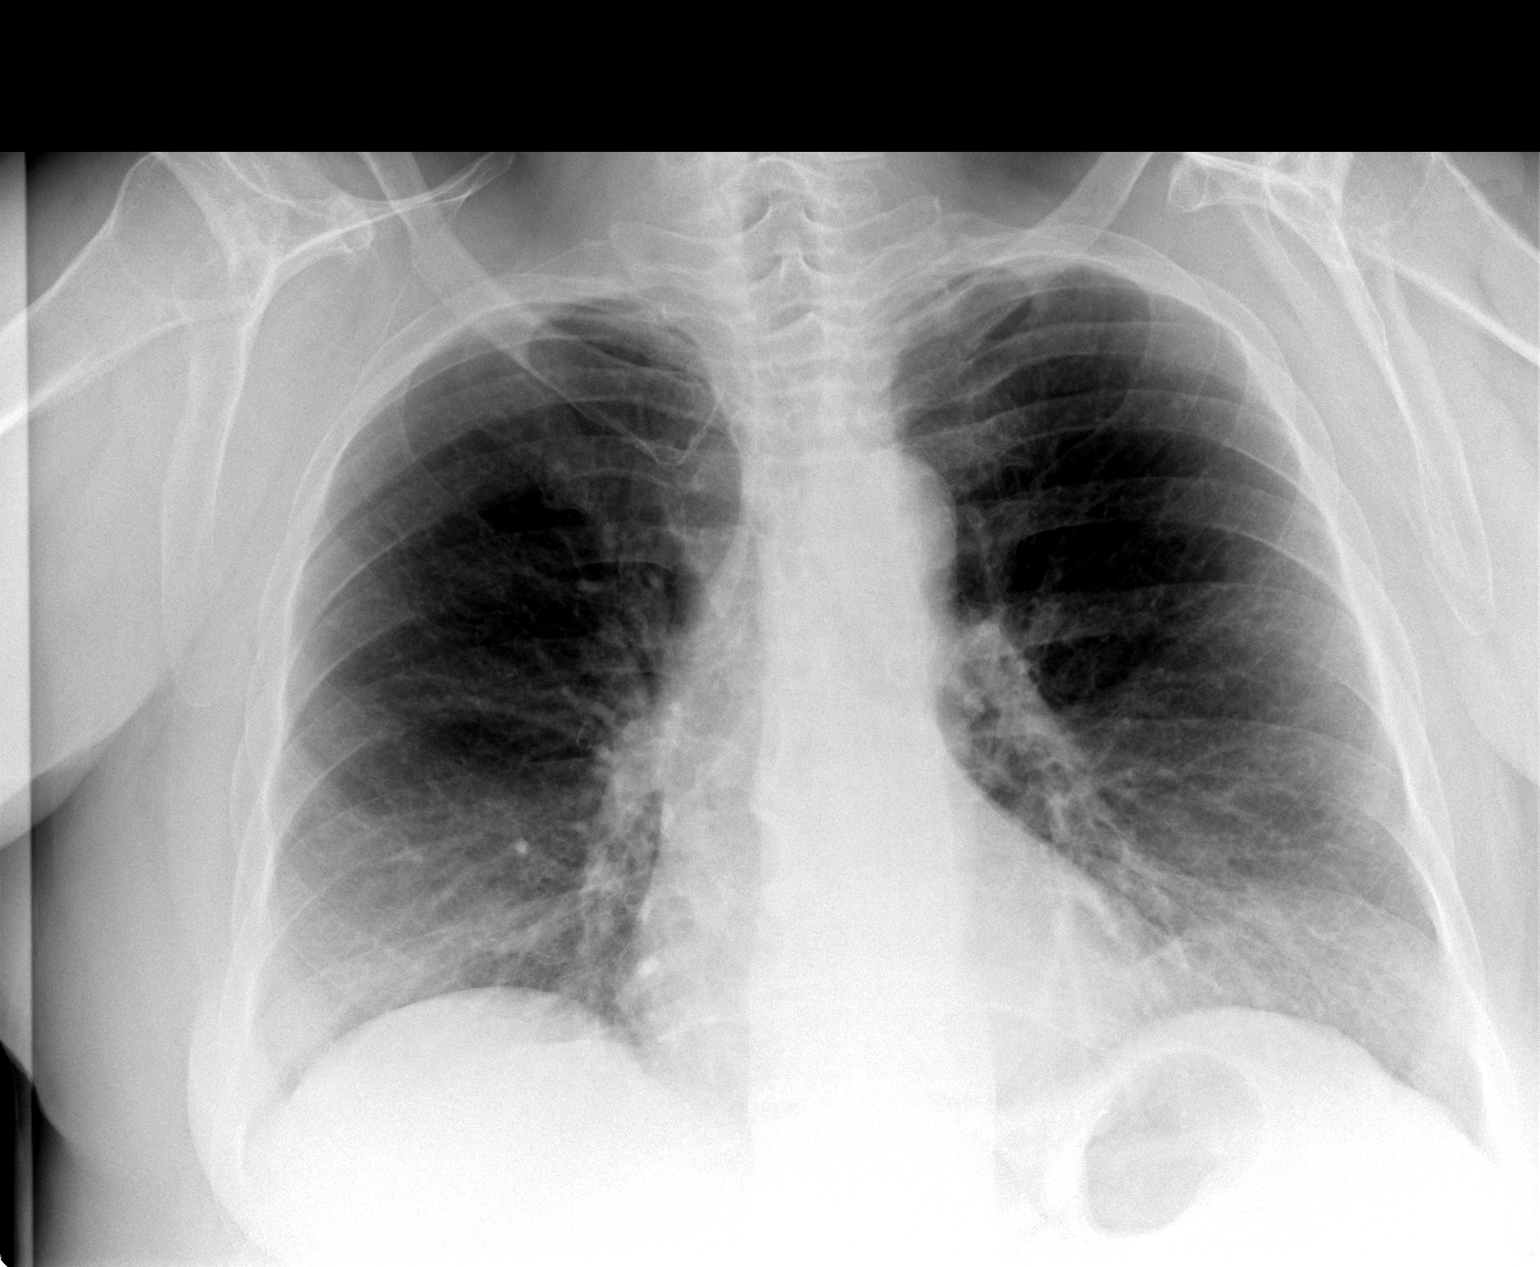

[view not recorded (2 of 2)]
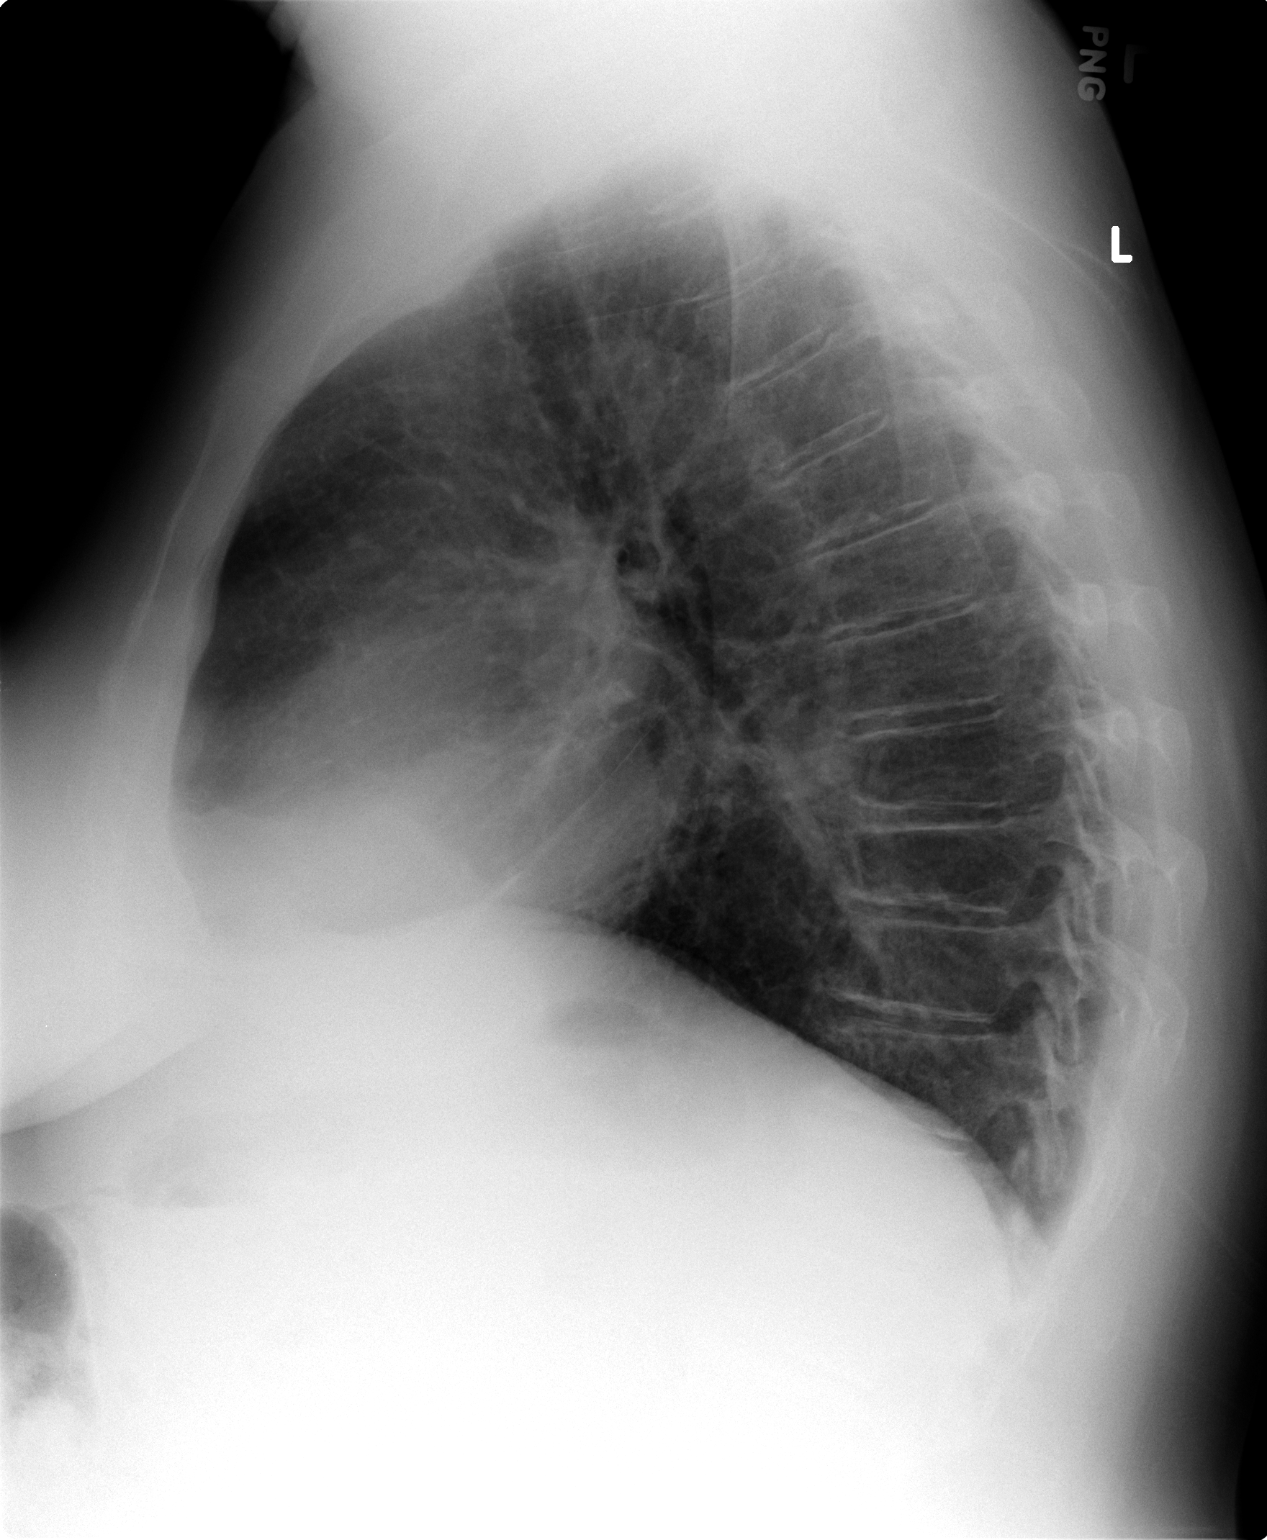

[2 of 2 positions shown; findings below may reference images not displayed]

FINDINGS: Lungs are essentially clear. No focal consolidation. No pleural
effusion or pneumothorax.

The heart is normal in size.

Degenerative changes of the visualized thoracolumbar spine.
IMPRESSION: No evidence of acute cardiopulmonary disease.

## 2015-05-27 ENCOUNTER — Ambulatory Visit (INDEPENDENT_AMBULATORY_CARE_PROVIDER_SITE_OTHER): Payer: Medicare Other | Admitting: *Deleted

## 2015-05-27 DIAGNOSIS — I2699 Other pulmonary embolism without acute cor pulmonale: Secondary | ICD-10-CM

## 2015-05-27 DIAGNOSIS — Z86711 Personal history of pulmonary embolism: Secondary | ICD-10-CM

## 2015-05-27 LAB — POCT INR: INR: 2.5

## 2015-06-02 ENCOUNTER — Other Ambulatory Visit: Payer: Self-pay | Admitting: Internal Medicine

## 2015-06-22 ENCOUNTER — Encounter: Payer: Self-pay | Admitting: Gastroenterology

## 2015-07-17 ENCOUNTER — Telehealth: Payer: Self-pay

## 2015-07-17 NOTE — Telephone Encounter (Signed)
Phone call from pt.  Reported open sore on inner aspect right ankle with foul smelling drainage.  Described the appearance of the sore as  "black" with a green and yellow drainage.  Stated the sore had been dime-size, and now is approx. size of "three half dollars put together."  Reported she had been wrapping the leg and when she removed the bandage today, she noted the above drainage and odor.  Stated she has had nausea and has been chilling approx. 3 days.  Advised pt. to go to the ER for evaluation, due to deteriorating condition of right LE ulcer.  Pt. Verb. Understanding.

## 2015-07-19 ENCOUNTER — Emergency Department (HOSPITAL_COMMUNITY)
Admission: EM | Admit: 2015-07-19 | Discharge: 2015-07-19 | Disposition: A | Discharge status: Home / Self Care | Payer: Medicare Other | Attending: Emergency Medicine | Admitting: Emergency Medicine

## 2015-07-19 ENCOUNTER — Encounter (HOSPITAL_COMMUNITY): Payer: Self-pay | Admitting: *Deleted

## 2015-07-19 DIAGNOSIS — Z85038 Personal history of other malignant neoplasm of large intestine: Secondary | ICD-10-CM | POA: Insufficient documentation

## 2015-07-19 DIAGNOSIS — Z87891 Personal history of nicotine dependence: Secondary | ICD-10-CM | POA: Diagnosis not present

## 2015-07-19 DIAGNOSIS — E079 Disorder of thyroid, unspecified: Secondary | ICD-10-CM | POA: Insufficient documentation

## 2015-07-19 DIAGNOSIS — M791 Myalgia: Secondary | ICD-10-CM | POA: Diagnosis not present

## 2015-07-19 DIAGNOSIS — L97329 Non-pressure chronic ulcer of left ankle with unspecified severity: Secondary | ICD-10-CM

## 2015-07-19 DIAGNOSIS — G8929 Other chronic pain: Secondary | ICD-10-CM | POA: Diagnosis not present

## 2015-07-19 DIAGNOSIS — I872 Venous insufficiency (chronic) (peripheral): Secondary | ICD-10-CM | POA: Insufficient documentation

## 2015-07-19 DIAGNOSIS — M79662 Pain in left lower leg: Secondary | ICD-10-CM | POA: Diagnosis present

## 2015-07-19 DIAGNOSIS — I739 Peripheral vascular disease, unspecified: Secondary | ICD-10-CM | POA: Diagnosis not present

## 2015-07-19 DIAGNOSIS — M549 Dorsalgia, unspecified: Secondary | ICD-10-CM | POA: Diagnosis not present

## 2015-07-19 DIAGNOSIS — Z86711 Personal history of pulmonary embolism: Secondary | ICD-10-CM | POA: Insufficient documentation

## 2015-07-19 DIAGNOSIS — Z79899 Other long term (current) drug therapy: Secondary | ICD-10-CM | POA: Diagnosis not present

## 2015-07-19 DIAGNOSIS — K5900 Constipation, unspecified: Secondary | ICD-10-CM | POA: Insufficient documentation

## 2015-07-19 DIAGNOSIS — Z862 Personal history of diseases of the blood and blood-forming organs and certain disorders involving the immune mechanism: Secondary | ICD-10-CM | POA: Insufficient documentation

## 2015-07-19 DIAGNOSIS — I878 Other specified disorders of veins: Secondary | ICD-10-CM | POA: Diagnosis not present

## 2015-07-19 DIAGNOSIS — R110 Nausea: Secondary | ICD-10-CM | POA: Diagnosis not present

## 2015-07-19 DIAGNOSIS — IMO0001 Reserved for inherently not codable concepts without codable children: Secondary | ICD-10-CM

## 2015-07-19 DIAGNOSIS — Z872 Personal history of diseases of the skin and subcutaneous tissue: Secondary | ICD-10-CM | POA: Insufficient documentation

## 2015-07-19 DIAGNOSIS — I2510 Atherosclerotic heart disease of native coronary artery without angina pectoris: Secondary | ICD-10-CM | POA: Insufficient documentation

## 2015-07-19 DIAGNOSIS — Z7901 Long term (current) use of anticoagulants: Secondary | ICD-10-CM | POA: Diagnosis not present

## 2015-07-19 DIAGNOSIS — I83023 Varicose veins of left lower extremity with ulcer of ankle: Secondary | ICD-10-CM

## 2015-07-19 LAB — COMPREHENSIVE METABOLIC PANEL
ALBUMIN: 3.3 g/dL — AB (ref 3.5–5.0)
ALK PHOS: 66 U/L (ref 38–126)
ALT: 8 U/L — AB (ref 14–54)
ANION GAP: 7 (ref 5–15)
AST: 17 U/L (ref 15–41)
BILIRUBIN TOTAL: 0.8 mg/dL (ref 0.3–1.2)
BUN: 7 mg/dL (ref 6–20)
CALCIUM: 8.9 mg/dL (ref 8.9–10.3)
CO2: 28 mmol/L (ref 22–32)
CREATININE: 0.7 mg/dL (ref 0.44–1.00)
Chloride: 105 mmol/L (ref 101–111)
GFR calc non Af Amer: >60 mL/min (ref >60)
GLUCOSE: 92 mg/dL (ref 65–99)
Potassium: 3.5 mmol/L (ref 3.5–5.1)
SODIUM: 140 mmol/L (ref 135–145)
TOTAL PROTEIN: 7.4 g/dL (ref 6.5–8.1)

## 2015-07-19 LAB — CBC WITH DIFFERENTIAL/PLATELET
BASOS PCT: 0 %
Basophils Absolute: 0 10*3/uL (ref 0.0–0.1)
Eosinophils Absolute: 0 10*3/uL (ref 0.0–0.7)
Eosinophils Relative: 1 %
HEMATOCRIT: 34.2 % — AB (ref 36.0–46.0)
Hemoglobin: 10.6 g/dL — ABNORMAL LOW (ref 12.0–15.0)
LYMPHS ABS: 0.5 10*3/uL — AB (ref 0.7–4.0)
Lymphocytes Relative: 15 %
MCH: 27.7 pg (ref 26.0–34.0)
MCHC: 31 g/dL (ref 30.0–36.0)
MCV: 89.3 fL (ref 78.0–100.0)
MONO ABS: 0.2 10*3/uL (ref 0.1–1.0)
MONOS PCT: 7 %
NEUTROS ABS: 2.4 10*3/uL (ref 1.7–7.7)
Neutrophils Relative %: 77 %
PLATELETS: 177 10*3/uL (ref 150–400)
RBC: 3.83 MIL/uL — ABNORMAL LOW (ref 3.87–5.11)
RDW: 14 % (ref 11.5–15.5)
WBC: 3.2 10*3/uL — ABNORMAL LOW (ref 4.0–10.5)

## 2015-07-19 LAB — I-STAT CG4 LACTIC ACID, ED: Lactic Acid, Venous: 0.48 mmol/L — ABNORMAL LOW (ref 0.5–2.0)

## 2015-07-19 LAB — PROTIME-INR
INR: 1.1 (ref 0.00–1.49)
PROTHROMBIN TIME: 14.4 s (ref 11.6–15.2)

## 2015-07-19 MED ORDER — ONDANSETRON 4 MG PO TBDP
4.0000 mg | ORAL_TABLET | Freq: Once | ORAL | Status: AC
  Administered 2015-07-19: 4 mg via ORAL
  Filled 2015-07-19: qty 1

## 2015-07-19 NOTE — ED Notes (Signed)
Jamie PA at bedside.   

## 2015-07-19 NOTE — Discharge Instructions (Signed)
1. Medications: Zofran for nausea - take this medication first, then take home medications. IT IS VERY IMPORTANT TO TAKE YOUR COUMADIN even if you are nauseous.  2. Treatment: rest, elevate extremities as much as possible, keep affected areas clean and dry.  3. Follow Up: Please call the wound center listed above first thing in the morning to make an appointment. They will help you keep your sores cleaned and dressed to prevent infection.  Please follow up with your primary doctor in 3 days for discussion of your diagnoses and further evaluation after today's visit; Please return to the ER for any new or worsening symptoms, any additional concerns.

## 2015-07-19 NOTE — ED Provider Notes (Signed)
CSN: [id number]     Arrival date & time 07/19/15  1803 History   First MD Initiated Contact with Patient 07/19/15 1806     Chief Complaint  Patient presents with  . Leg Pain     (Consider location/radiation/quality/duration/timing/severity/associated sxs/prior Treatment) The history is provided by the patient and medical records. No language interpreter was used.   [name] is a 59 y.o. female  with a PMH of venous insufficiency, PVD, CAD, pulmonary embolism, and other chronic medical conditions who presents to the Emergency Department complaining of bilateral worsening lower extremity ulcers (R>L) over the past week. She states ulcer has progressed in size, now has a bad odor, and is more painful than in the past. Pain is described as sharp 7/10 pain now, 10/10 at worst. No change in color of lower extremities. Admits to nausea, stating she has not taken her medications (including her coumadin) yesterday or today.   Past Medical History  Diagnosis Date  . Other pulmonary embolism and infarction     Significant 2008 and coumadin therapy RV dysfunction...echo...2008..EF 50%..right ventricle markedly dilated w marked right ventricular dysfunc and moder tricuspid regurg/echo..March 2010, Ef 50%, mild dilation of right ventricule w mild decrease right ventric function   . Hypopotassemia   . Tobacco use disorder   . Anemia, unspecified   . Hypotension, unspecified   . Chronic low back pain   . CAD (coronary artery disease)     minimal catheterization, October 2008  . Chest pain     with stress  . Constipation     and diarrhea chronic..Dr. Alben Spittle  . Right bundle branch block     intermittent  . Leg pain   . Back pain   . Methadone adverse reaction     for chronic leg and back pain  . Homocystinemia ([place])     signif elevation in the past...plan folic acid, B6, 123456  . 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  . Cellulitis   . Thyroid disease   .  Peripheral vascular disease ([place])   . Venous insufficiency (chronic) (peripheral)     Patient's legs were wrapped 2013  . Diverticulosis of colon (without mention of hemorrhage)   . Benign neoplasm of colon 12/2007    Hyperplastic colon polyps   Past Surgical History  Procedure Laterality Date  . Cholecystectomy    . Gastric bypass    . Tonsillectomy    . Joint replacement      right total hip replacement  . Rotator cuff repair      right shoulder   Family History  Problem Relation Age of Onset  . Crohn's disease Sister   . Heart disease Mother   . Heart disease Father    Social History  Substance Use Topics  . Smoking status: Former Smoker    Types: Cigarettes    Quit date: 06/09/2011  . Smokeless tobacco: Never Used     Comment: 2 ciggs a day  . Alcohol Use: No   OB History    No data available     Review of Systems  Constitutional: Negative.   HENT: Negative for congestion, rhinorrhea and sore throat.   Eyes: Negative for visual disturbance.  Respiratory: Negative for cough, shortness of breath and wheezing.   Cardiovascular: Negative for chest pain, palpitations and leg swelling.  Gastrointestinal: Positive for nausea. Negative for vomiting, abdominal pain, diarrhea and constipation.  Endocrine: Negative for polydipsia and polyuria.  Musculoskeletal: Positive  for myalgias and back pain. Negative for arthralgias and neck pain.  Skin: Positive for wound.  Neurological: Negative for dizziness, weakness and headaches.      Allergies  Amitiza; Morphine; Nsaids; and Venlafaxine  Home Medications   Prior to Admission medications   Medication Sig Start Date End Date Taking? Authorizing Provider  b complex vitamins tablet Take 1 tablet by mouth daily.    Historical Provider, MD  calcium carbonate 200 MG capsule Take 250 mg by mouth 2 (two) times daily with a meal.    Historical Provider, MD  Calcium Carbonate Antacid (ROLAIDS EXTRA STRENGTH) 1177 MG CHEW Chew 1  tablet by mouth as needed. For vomiting    Historical Provider, MD  HYDROmorphone (DILAUDID) 4 MG tablet  05/21/14   Historical Provider, MD  levothyroxine (SYNTHROID, LEVOTHROID) 125 MCG tablet Take 1 tablet (125 mcg total) by mouth daily before breakfast. **PT NEEDS A FOLLOW WITH DR Cruzita Lederer** 06/02/15   Philemon Kingdom, MD  magnesium hydroxide (MILK OF MAGNESIA) 400 MG/5ML suspension Take 30 mLs by mouth as needed. For nausea    Historical Provider, MD  methadone (DOLOPHINE) 10 MG tablet Take 20 mg by mouth 3 (three) times daily.     Historical Provider, MD  warfarin (COUMADIN) 5 MG tablet TAKE AS DIRECTED BY ANTICOAGULATION CLINIC 02/27/15   Carlena Bjornstad, MD   BP 110/66 mmHg  Pulse 51  Temp(Src) 98.4 F (36.9 C) (Oral)  SpO2 95% Physical Exam  Constitutional: She is oriented to person, place, and time. She appears well-developed and well-nourished.  Alert and in no acute distress  HENT:  Head: Normocephalic and atraumatic.  Cardiovascular: Normal rate, regular rhythm and normal heart sounds.  Exam reveals no gallop and no friction rub.   No murmur heard. 1+, faint but equal, pulses of bilateral lower extremities.   Pulmonary/Chest: Effort normal and breath sounds normal. No respiratory distress. She has no wheezes. She has no rales. She exhibits no tenderness.  Abdominal: She exhibits no mass. There is no rebound and no guarding.  Abdomen soft, non-tender, non-distended Bowel sounds positive in all four quadrants  Musculoskeletal:  5/5 muscle strength in all four extremities   Neurological: She is alert and oriented to person, place, and time.  Skin: Skin is warm and dry.     4 cm shallow ulcer with clear drainage present on exam of medial distal RLE 3 cm shallow ulcer with clear drainage present on exam of medial distal RLE + Venous dermatitis of bilateral LE's  Pigmentation changes located in areas of bilateral LE's as indicted by lines in image.   Psychiatric: She has a  normal mood and affect. Her behavior is normal. Judgment and thought content normal.  Nursing note and vitals reviewed.   ED Course  Procedures (including critical care time) Labs Review Labs Reviewed  CBC WITH DIFFERENTIAL/PLATELET - Abnormal; Notable for the following:    WBC 3.2 (*)    RBC 3.83 (*)    Hemoglobin 10.6 (*)    HCT 34.2 (*)    Lymphs Abs 0.5 (*)    All other components within normal limits  COMPREHENSIVE METABOLIC PANEL - Abnormal; Notable for the following:    Albumin 3.3 (*)    ALT 8 (*)    All other components within normal limits  I-STAT CG4 LACTIC ACID, ED - Abnormal; Notable for the following:    Lactic Acid, Venous 0.48 (*)    All other components within normal limits  WOUND CULTURE  WOUND CULTURE  PROTIME-INR    Imaging Review No results found. I have personally reviewed and evaluated these images and lab results as part of my medical decision-making.   EKG Interpretation None      MDM   Final diagnoses:  Venous stasis ulcer of ankle, left (HCC)  Venous stasis ulcer, right ([place])  Peripheral vascular disease ([place])   [name] presents with worsening lower extremity pain and ulcers.   Labs: Lactic, CBC, CMP reassuring Wound cultures taken from both ulcer sites.   Therapeutics: Zofran given for nausea.    A&P: Venous stasis ulcers  - See wound care  - Keep affected areas clean and dry  - Elevate extremities, compression stocking  Nausea:  - Zofran rx   - Continue taking medications, especially coumadin  Patient seen by and discussed with Dr. Wilson Singer who agrees with treatment plan.    [facility] Charlette Hennings, PA-C 07/19/15 2030  Virgel Manifold, MD 07/19/15 [phone]

## 2015-07-19 NOTE — ED Notes (Signed)
Pt ambulates to room with no assistance.

## 2015-07-23 LAB — WOUND CULTURE: Special Requests: NORMAL

## 2015-07-24 ENCOUNTER — Telehealth (HOSPITAL_COMMUNITY): Payer: Self-pay

## 2015-07-24 ENCOUNTER — Telehealth: Payer: Self-pay | Admitting: *Deleted

## 2015-07-24 LAB — WOUND CULTURE: SPECIAL REQUESTS: NORMAL

## 2015-07-24 NOTE — Telephone Encounter (Signed)
Patient called to inform us that she was started on Cipro 500mg  BID x 10 days for cellulitis will start tonight her first dose tonight.  Advised that the medication can interfere with the medication & she verbalized understanding.  Instructed we need to see her in 3-4 days after starting she stated she would call back to schedule an appointment.  I stressed the importance of having her INR checked on Monday, December 19th which is 3 days after starting the medication & she verbalized understanding.

## 2015-07-24 NOTE — Progress Notes (Signed)
ED Antimicrobial Stewardship Positive Culture Follow Up   [name] is an 59 y.o. female who presented to [facility] on 07/19/2015 with a chief complaint of  Chief Complaint  Patient presents with  . Leg Pain    Recent Results (from the past 720 hour(s))  Wound culture     Status: None   Collection Time: 07/19/15  7:12 PM  Result Value Ref Range Status   Specimen Description WOUND LEFT LEG  Final   Special Requests Normal  Final   Gram Stain   Final    RARE WBC PRESENT, PREDOMINANTLY PMN RARE SQUAMOUS EPITHELIAL CELLS PRESENT ABUNDANT GRAM NEGATIVE RODS ABUNDANT GRAM POSITIVE COCCI IN PAIRS Performed at [facility]    Culture   Final    ABUNDANT PSEUDOMONAS AERUGINOSA RARE STAPHYLOCOCCUS AUREUS Note: RIFAMPIN AND GENTAMICIN SHOULD NOT BE USED AS SINGLE DRUGS FOR TREATMENT OF STAPH INFECTIONS. Performed at [facility]    Report Status 07/23/2015 FINAL  Final   Organism ID, Bacteria PSEUDOMONAS AERUGINOSA  Final   Organism ID, Bacteria STAPHYLOCOCCUS AUREUS  Final      Susceptibility   Staphylococcus aureus - MIC*    CLINDAMYCIN <=0.25 SENSITIVE Sensitive     ERYTHROMYCIN <=0.25 SENSITIVE Sensitive     GENTAMICIN <=0.5 SENSITIVE Sensitive     LEVOFLOXACIN <=0.12 SENSITIVE Sensitive     OXACILLIN <=0.25 SENSITIVE Sensitive     RIFAMPIN <=0.5 SENSITIVE Sensitive     TRIMETH/SULFA <=10 SENSITIVE Sensitive     VANCOMYCIN 1 SENSITIVE Sensitive     TETRACYCLINE <=1 SENSITIVE Sensitive     MOXIFLOXACIN <=0.25 SENSITIVE Sensitive     * RARE STAPHYLOCOCCUS AUREUS   Pseudomonas aeruginosa - MIC*    CEFEPIME 2 SENSITIVE Sensitive     CEFTAZIDIME 4 SENSITIVE Sensitive     CIPROFLOXACIN <=0.25 SENSITIVE Sensitive     GENTAMICIN <=1 SENSITIVE Sensitive     IMIPENEM 2 SENSITIVE Sensitive     PIP/TAZO 8 SENSITIVE Sensitive     TOBRAMYCIN <=1 SENSITIVE Sensitive     * ABUNDANT PSEUDOMONAS AERUGINOSA  Wound culture     Status: None   Collection Time: 07/19/15   7:12 PM  Result Value Ref Range Status   Specimen Description WOUND RIGHT LEG  Final   Special Requests Normal  Final   Gram Stain   Final    RARE WBC PRESENT, PREDOMINANTLY PMN NO SQUAMOUS EPITHELIAL CELLS SEEN ABUNDANT GRAM POSITIVE COCCI IN PAIRS Performed at [facility]    Culture   Final    ABUNDANT STAPHYLOCOCCUS AUREUS Note: RIFAMPIN AND GENTAMICIN SHOULD NOT BE USED AS SINGLE DRUGS FOR TREATMENT OF STAPH INFECTIONS. ABUNDANT PSEUDOMONAS AERUGINOSA Performed at [facility]    Report Status 07/24/2015 FINAL  Final   Organism ID, Bacteria STAPHYLOCOCCUS AUREUS  Final   Organism ID, Bacteria PSEUDOMONAS AERUGINOSA  Final      Susceptibility   Staphylococcus aureus - MIC*    CLINDAMYCIN <=0.25 SENSITIVE Sensitive     ERYTHROMYCIN <=0.25 SENSITIVE Sensitive     GENTAMICIN <=0.5 SENSITIVE Sensitive     LEVOFLOXACIN 0.25 SENSITIVE Sensitive     OXACILLIN <=0.25 SENSITIVE Sensitive     RIFAMPIN <=0.5 SENSITIVE Sensitive     TRIMETH/SULFA <=10 SENSITIVE Sensitive     VANCOMYCIN <=0.5 SENSITIVE Sensitive     TETRACYCLINE <=1 SENSITIVE Sensitive     MOXIFLOXACIN <=0.25 SENSITIVE Sensitive     * ABUNDANT STAPHYLOCOCCUS AUREUS   Pseudomonas aeruginosa - MIC*    CEFEPIME 2  SENSITIVE Sensitive     CEFTAZIDIME 4 SENSITIVE Sensitive     CIPROFLOXACIN <=0.25 SENSITIVE Sensitive     GENTAMICIN <=1 SENSITIVE Sensitive     IMIPENEM 2 SENSITIVE Sensitive     PIP/TAZO 8 SENSITIVE Sensitive     TOBRAMYCIN <=1 SENSITIVE Sensitive     * ABUNDANT PSEUDOMONAS AERUGINOSA    [x]  Patient discharged originally without antimicrobial agent and treatment is now indicated.  New antibiotic prescription: Ciprofloxacin 500mg  PO BID x 10 days  ED Provider: Glendell Docker NP   Reginia Naas 07/24/2015, 8:51 AM Infectious Diseases Pharmacist Phone# [phone]

## 2015-07-24 NOTE — Telephone Encounter (Signed)
Post ED Visit - Positive Culture Follow-up: Successful Patient Follow-Up  Culture assessed and recommendations reviewed by: [x]  Elenor Quinones, Pharm.D. []  Heide Guile, Pharm.D., BCPS []  Parks Neptune, Pharm.D. []  Alycia Rossetti, Pharm.D., BCPS []  [place], Pharm.D., BCPS, AAHIVP []  Legrand Como, Pharm.D., BCPS, AAHIVP []  Milus Glazier, Pharm.D. []  Stephens November, Pharm.D.  Positive wound culture, Rare Staph Aureus and Pseudomonas Aeruginosa  [x]  Patient discharged without antimicrobial prescription and treatment is now indicated []  Organism is resistant to prescribed ED discharge antimicrobial []  Patient with positive blood cultures  Changes discussed with ED provider: Aleene Davidson FNP  New antibiotic prescription "Ciprofloxacin 500 mg po BID x 10 days" Called to [facility] [phone] and given to the pharmacist  Contacted patient, date 07/24/15, time 13:36 Pt informed. Pt will notify Coumadin lab starting Ciprofloxacin    Dortha Kern 07/24/2015, 1:39 PM

## 2015-08-10 ENCOUNTER — Other Ambulatory Visit: Payer: Self-pay | Admitting: Internal Medicine

## 2015-09-17 ENCOUNTER — Telehealth: Payer: Self-pay | Admitting: Family Medicine

## 2015-09-17 NOTE — Telephone Encounter (Signed)
lvm inquiring if patient received flu shot  °

## 2015-10-10 ENCOUNTER — Encounter (HOSPITAL_COMMUNITY): Payer: Self-pay | Admitting: Emergency Medicine

## 2015-10-10 ENCOUNTER — Emergency Department (HOSPITAL_COMMUNITY): Payer: Medicare Other

## 2015-10-10 ENCOUNTER — Emergency Department (HOSPITAL_COMMUNITY)
Admission: EM | Admit: 2015-10-10 | Discharge: 2015-10-10 | Disposition: A | Discharge status: Home / Self Care | Payer: Medicare Other | Attending: Emergency Medicine | Admitting: Emergency Medicine

## 2015-10-10 DIAGNOSIS — Z86018 Personal history of other benign neoplasm: Secondary | ICD-10-CM | POA: Diagnosis not present

## 2015-10-10 DIAGNOSIS — L97911 Non-pressure chronic ulcer of unspecified part of right lower leg limited to breakdown of skin: Secondary | ICD-10-CM | POA: Diagnosis not present

## 2015-10-10 DIAGNOSIS — M5431 Sciatica, right side: Secondary | ICD-10-CM

## 2015-10-10 DIAGNOSIS — Z86711 Personal history of pulmonary embolism: Secondary | ICD-10-CM | POA: Insufficient documentation

## 2015-10-10 DIAGNOSIS — L97901 Non-pressure chronic ulcer of unspecified part of unspecified lower leg limited to breakdown of skin: Secondary | ICD-10-CM | POA: Diagnosis not present

## 2015-10-10 DIAGNOSIS — Z8719 Personal history of other diseases of the digestive system: Secondary | ICD-10-CM | POA: Diagnosis not present

## 2015-10-10 DIAGNOSIS — Z87891 Personal history of nicotine dependence: Secondary | ICD-10-CM | POA: Insufficient documentation

## 2015-10-10 DIAGNOSIS — Z79899 Other long term (current) drug therapy: Secondary | ICD-10-CM | POA: Insufficient documentation

## 2015-10-10 DIAGNOSIS — R509 Fever, unspecified: Secondary | ICD-10-CM | POA: Diagnosis not present

## 2015-10-10 DIAGNOSIS — M25551 Pain in right hip: Secondary | ICD-10-CM | POA: Diagnosis present

## 2015-10-10 DIAGNOSIS — Z7901 Long term (current) use of anticoagulants: Secondary | ICD-10-CM | POA: Insufficient documentation

## 2015-10-10 DIAGNOSIS — M5441 Lumbago with sciatica, right side: Secondary | ICD-10-CM | POA: Diagnosis not present

## 2015-10-10 DIAGNOSIS — I2510 Atherosclerotic heart disease of native coronary artery without angina pectoris: Secondary | ICD-10-CM | POA: Diagnosis not present

## 2015-10-10 DIAGNOSIS — G8929 Other chronic pain: Secondary | ICD-10-CM | POA: Diagnosis not present

## 2015-10-10 LAB — CBC WITH DIFFERENTIAL/PLATELET
BASOS ABS: 0 10*3/uL (ref 0.0–0.1)
Basophils Relative: 0 %
EOS PCT: 1 %
Eosinophils Absolute: 0 10*3/uL (ref 0.0–0.7)
HCT: 34.9 % — ABNORMAL LOW (ref 36.0–46.0)
Hemoglobin: 11.4 g/dL — ABNORMAL LOW (ref 12.0–15.0)
LYMPHS PCT: 18 %
Lymphs Abs: 0.7 10*3/uL (ref 0.7–4.0)
MCH: 27.7 pg (ref 26.0–34.0)
MCHC: 32.7 g/dL (ref 30.0–36.0)
MCV: 84.9 fL (ref 78.0–100.0)
Monocytes Absolute: 0.2 10*3/uL (ref 0.1–1.0)
Monocytes Relative: 6 %
NEUTROS ABS: 3 10*3/uL (ref 1.7–7.7)
NEUTROS PCT: 75 %
PLATELETS: 213 10*3/uL (ref 150–400)
RBC: 4.11 MIL/uL (ref 3.87–5.11)
RDW: 15.2 % (ref 11.5–15.5)
WBC: 3.9 10*3/uL — AB (ref 4.0–10.5)

## 2015-10-10 LAB — COMPREHENSIVE METABOLIC PANEL
ALT: 9 U/L — ABNORMAL LOW (ref 14–54)
ANION GAP: 9 (ref 5–15)
AST: 18 U/L (ref 15–41)
Albumin: 3.2 g/dL — ABNORMAL LOW (ref 3.5–5.0)
Alkaline Phosphatase: 74 U/L (ref 38–126)
BUN: 9 mg/dL (ref 6–20)
CHLORIDE: 99 mmol/L — AB (ref 101–111)
CO2: 28 mmol/L (ref 22–32)
CREATININE: 0.77 mg/dL (ref 0.44–1.00)
Calcium: 8.9 mg/dL (ref 8.9–10.3)
Glucose, Bld: 103 mg/dL — ABNORMAL HIGH (ref 65–99)
POTASSIUM: 3.7 mmol/L (ref 3.5–5.1)
Sodium: 136 mmol/L (ref 135–145)
Total Bilirubin: 0.5 mg/dL (ref 0.3–1.2)
Total Protein: 7.2 g/dL (ref 6.5–8.1)

## 2015-10-10 LAB — URINE MICROSCOPIC-ADD ON

## 2015-10-10 LAB — URINALYSIS, ROUTINE W REFLEX MICROSCOPIC
Bilirubin Urine: NEGATIVE
GLUCOSE, UA: NEGATIVE mg/dL
Ketones, ur: NEGATIVE mg/dL
Nitrite: NEGATIVE
PROTEIN: NEGATIVE mg/dL
Specific Gravity, Urine: 1.009 (ref 1.005–1.030)
pH: 7.5 (ref 5.0–8.0)

## 2015-10-10 LAB — I-STAT CG4 LACTIC ACID, ED: LACTIC ACID, VENOUS: 0.87 mmol/L (ref 0.5–2.0)

## 2015-10-10 MED ORDER — HYDROCODONE-ACETAMINOPHEN 5-325 MG PO TABS: 1.0000 | ORAL_TABLET | Freq: Four times a day (QID) | ORAL | Status: DC | PRN

## 2015-10-10 MED ORDER — PREDNISONE 20 MG PO TABS
60.0000 mg | ORAL_TABLET | Freq: Once | ORAL | Status: AC
  Administered 2015-10-10: 60 mg via ORAL
  Filled 2015-10-10: qty 3

## 2015-10-10 MED ORDER — HYDROCODONE-ACETAMINOPHEN 5-325 MG PO TABS
2.0000 | ORAL_TABLET | Freq: Once | ORAL | Status: AC
Start: 2015-10-10 — End: 2015-10-10
  Administered 2015-10-10: 2 via ORAL
  Filled 2015-10-10: qty 2

## 2015-10-10 MED ORDER — PREDNISONE 20 MG PO TABS: ORAL_TABLET | ORAL | Status: DC

## 2015-10-10 MED ORDER — CEPHALEXIN 500 MG PO CAPS: 500.0000 mg | ORAL_CAPSULE | Freq: Four times a day (QID) | ORAL | Status: DC

## 2015-10-10 NOTE — Discharge Instructions (Signed)
It was our pleasure to provide your ER care today - we hope that you feel better.  Rest. Heat to sore area.  Avoid bending at waist, or heavy lifting > 10 lbs for 1 week.   Take prednisone as prescribed.  You may take hydrocodone as need for pain. No driving when taking hydrocodone. Also, do not take tylenol or acetaminophen containing medication when taking hydrocodone.  For leg wounds, keep skin very clean. Wash with warm soap and water 2x/day.  Apply thin coat of bacitracin ointment to area.  Take keflex (antibiotic) as prescribed.  Follow up with primary care doctor, and back specialist, in the coming week.  Return to ER if worse, new symptoms, high fevers, spreading redness, intractable pain, numbness/weakness, other concern.  You were given pain medication in the ER - no driving for the next 4 hours.      Sciatica Sciatica is pain, weakness, numbness, or tingling along the path of the sciatic nerve. The nerve starts in the lower back and runs down the back of each leg. The nerve controls the muscles in the lower leg and in the back of the knee, while also providing sensation to the back of the thigh, lower leg, and the sole of your foot. Sciatica is a symptom of another medical condition. For instance, nerve damage or certain conditions, such as a herniated disk or bone spur on the spine, pinch or put pressure on the sciatic nerve. This causes the pain, weakness, or other sensations normally associated with sciatica. Generally, sciatica only affects one side of the body. CAUSES   Herniated or slipped disc.  Degenerative disk disease.  A pain disorder involving the narrow muscle in the buttocks (piriformis syndrome).  Pelvic injury or fracture.  Pregnancy.  Tumor (rare). SYMPTOMS  Symptoms can vary from mild to very severe. The symptoms usually travel from the low back to the buttocks and down the back of the leg. Symptoms can include:  Mild tingling or dull aches in the  lower back, leg, or hip.  Numbness in the back of the calf or sole of the foot.  Burning sensations in the lower back, leg, or hip.  Sharp pains in the lower back, leg, or hip.  Leg weakness.  Severe back pain inhibiting movement. These symptoms may get worse with coughing, sneezing, laughing, or prolonged sitting or standing. Also, being overweight may worsen symptoms. DIAGNOSIS  Your caregiver will perform a physical exam to look for common symptoms of sciatica. He or she may ask you to do certain movements or activities that would trigger sciatic nerve pain. Other tests may be performed to find the cause of the sciatica. These may include:  Blood tests.  X-rays.  Imaging tests, such as an MRI or CT scan. TREATMENT  Treatment is directed at the cause of the sciatic pain. Sometimes, treatment is not necessary and the pain and discomfort goes away on its own. If treatment is needed, your caregiver may suggest:  Over-the-counter medicines to relieve pain.  Prescription medicines, such as anti-inflammatory medicine, muscle relaxants, or narcotics.  Applying heat or ice to the painful area.  Steroid injections to lessen pain, irritation, and inflammation around the nerve.  Reducing activity during periods of pain.  Exercising and stretching to strengthen your abdomen and improve flexibility of your spine. Your caregiver may suggest losing weight if the extra weight makes the back pain worse.  Physical therapy.  Surgery to eliminate what is pressing or pinching the nerve, such  as a bone spur or part of a herniated disk. HOME CARE INSTRUCTIONS   Only take over-the-counter or prescription medicines for pain or discomfort as directed by your caregiver.  Apply ice to the affected area for 20 minutes, 3-4 times a day for the first 48-72 hours. Then try heat in the same way.  Exercise, stretch, or perform your usual activities if these do not aggravate your pain.  Attend physical  therapy sessions as directed by your caregiver.  Keep all follow-up appointments as directed by your caregiver.  Do not wear high heels or shoes that do not provide proper support.  Check your mattress to see if it is too soft. A firm mattress may lessen your pain and discomfort. SEEK IMMEDIATE MEDICAL CARE IF:   You lose control of your bowel or bladder (incontinence).  You have increasing weakness in the lower back, pelvis, buttocks, or legs.  You have redness or swelling of your back.  You have a burning sensation when you urinate.  You have pain that gets worse when you lie down or awakens you at night.  Your pain is worse than you have experienced in the past.  Your pain is lasting longer than 4 weeks.  You are suddenly losing weight without reason. MAKE SURE YOU:  Understand these instructions.  Will watch your condition.  Will get help right away if you are not doing well or get worse.   This information is not intended to replace advice given to you by your health care provider. Make sure you discuss any questions you have with your health care provider.   Document Released: 07/19/2001 Document Revised: 04/15/2015 Document Reviewed: 12/04/2011 Else   Back Pain, Adult Back pain is very common in adults.The cause of back pain is rarely dangerous and the pain often gets better over time.The cause of your back pain may not be known. Some common causes of back pain include:  Strain of the muscles or ligaments supporting the spine.  Wear and tear (degeneration) of the spinal disks.  Arthritis.  Direct injury to the back. For many people, back pain may return. Since back pain is rarely dangerous, most people can learn to manage this condition on their own. HOME CARE INSTRUCTIONS Watch your back pain for any changes. The following actions may help to lessen any discomfort you are feeling:  Remain active. It is stressful on your back to sit or stand in one place  for long periods of time. Do not sit, drive, or stand in one place for more than 30 minutes at a time. Take short walks on even surfaces as soon as you are able.Try to increase the length of time you walk each day.  Exercise regularly as directed by your health care provider. Exercise helps your back heal faster. It also helps avoid future injury by keeping your muscles strong and flexible.  Do not stay in bed.Resting more than 1-2 days can delay your recovery.  Pay attention to your body when you bend and lift. The most comfortable positions are those that put less stress on your recovering back. Always use proper lifting techniques, including:  Bending your knees.  Keeping the load close to your body.  Avoiding twisting.  Find a comfortable position to sleep. Use a firm mattress and lie on your side with your knees slightly bent. If you lie on your back, put a pillow under your knees.  Avoid feeling anxious or stressed.Stress increases muscle tension and can worsen back  pain.It is important to recognize when you are anxious or stressed and learn ways to manage it, such as with exercise.  Take medicines only as directed by your health care provider. Over-the-counter medicines to reduce pain and inflammation are often the most helpful.Your health care provider may prescribe muscle relaxant drugs.These medicines help dull your pain so you can more quickly return to your normal activities and healthy exercise.  Apply ice to the injured area:  Put ice in a plastic bag.  Place a towel between your skin and the bag.  Leave the ice on for 20 minutes, 2-3 times a day for the first 2-3 days. After that, ice and heat may be alternated to reduce pain and spasms.  Maintain a healthy weight. Excess weight puts extra stress on your back and makes it difficult to maintain good posture. SEEK MEDICAL CARE IF:  You have pain that is not relieved with rest or medicine.  You have increasing pain  going down into the legs or buttocks.  You have pain that does not improve in one week.  You have night pain.  You lose weight.  You have a fever or chills. SEEK IMMEDIATE MEDICAL CARE IF:   You develop new bowel or bladder control problems.  You have unusual weakness or numbness in your arms or legs.  You develop nausea or vomiting.  You develop abdominal pain.  You feel faint.   This information is not intended to replace advice given to you by your health care provider. Make sure you discuss any questions you have with your health care provider.   Document Released: 07/25/2005 Document Revised: 08/15/2014 Document Reviewed: 11/26/2013 Elsevier Interactive Patient Education 2016 [place] Interactive Patient Education [employer].    Skin Ulcer A skin ulcer is an open sore that can be shallow or deep. Skin ulcers sometimes become infected and are difficult to treat. It may be 1 month or longer before real healing progress is made. CAUSES   Injury.  Problems with the veins or arteries.  Diabetes.  Insect bites.  Bedsores.  Inflammatory conditions. SYMPTOMS   Pain, redness, swelling, and tenderness around the ulcer.  Fever.  Bleeding from the ulcer.  Yellow or clear fluid coming from the ulcer. DIAGNOSIS  There are many types of skin ulcers. Any open sores will be examined. Certain tests will be done to determine the kind of ulcer you have. The right treatment depends on the type of ulcer you have. TREATMENT  Treatment is a long-term challenge. It may include:  Wearing an elastic wrap, compression stockings, or gel cast over the ulcer area.  Taking antibiotic medicines or putting antibiotic creams on the affected area if there is an infection. HOME CARE INSTRUCTIONS  Put on your bandages (dressings), wraps, or casts over the ulcer as directed by your caregiver.  Change all dressings as directed by your caregiver.  Take all medicines  as directed by your caregiver.  Keep the affected area clean and dry.  Avoid injuries to the affected area.  Eat a well-balanced, healthy diet that includes plenty of fruit and vegetables.  If you smoke, consider quitting or decreasing the amount of cigarettes you smoke.  Once the ulcer heals, get regular exercise as directed by your caregiver.  Work with your caregiver to make sure your blood pressure, cholesterol, and diabetes are well-controlled.  Keep your skin moisturized. Dry skin can crack and lead to skin ulcers. SEEK IMMEDIATE MEDICAL CARE IF:   Your  pain gets worse.  You have swelling, redness, or fluids around the ulcer.  You have chills.  You have a fever. MAKE SURE YOU:   Understand these instructions.  Will watch your condition.  Will get help right away if you are not doing well or get worse.   This information is not intended to replace advice given to you by your health care provider. Make sure you discuss any questions you have with your health care provider.   Document Released: 09/01/2004 Document Revised: 10/17/2011 Document Reviewed: 03/11/2011 Elsevier Interactive Patient Education 2016 [place] Taking care of your wound properly can help to prevent pain and infection. It can also help your wound to heal more quickly.  HOW TO CARE FOR YOUR WOUND  Take or apply over-the-counter and prescription medicines only as told by your health care provider.  If you were prescribed antibiotic medicine, take or apply it as told by your health care provider. Do not stop using the antibiotic even if your condition improves.  Clean the wound each day or as told by your health care provider.  Wash the wound with mild soap and water.  Rinse the wound with water to remove all soap.  Pat the wound dry with a clean towel. Do not rub it.  There are many different ways to close and cover a wound. For example, a wound can be covered with  stitches (sutures), skin glue, or adhesive strips. Follow instructions from your health care provider about:  How to take care of your wound.  When and how you should change your bandage (dressing).  When you should remove your dressing.  Removing whatever was used to close your wound.  Check your wound every day for signs of infection. Watch for:  Redness, swelling, or pain.  Fluid, blood, or pus.  Keep the dressing dry until your health care provider says it can be removed. Do not take baths, swim, use a hot tub, or do anything that would put your wound underwater until your health care provider approves.  Raise (elevate) the injured area above the level of your heart while you are sitting or lying down.  Do not scratch or pick at the wound.  Keep all follow-up visits as told by your health care provider. This is important. SEEK MEDICAL CARE IF:  You received a tetanus shot and you have swelling, severe pain, redness, or bleeding at the injection site.  You have a fever.  Your pain is not controlled with medicine.  You have increased redness, swelling, or pain at the site of your wound.  You have fluid, blood, or pus coming from your wound.  You notice a bad smell coming from your wound or your dressing. SEEK IMMEDIATE MEDICAL CARE IF:  You have a red streak going away from your wound.   This information is not intended to replace advice given to you by your health care provider. Make sure you discuss any questions you have with your health care provider.   Document Released: 05/03/2008 Document Revised: 12/09/2014 Document Reviewed: 07/21/2014 Elsevier Interactive Patient Education 2016 [place] therapy can help ease sore, stiff, injured, and tight muscles and joints. Heat relaxes your muscles, which may help ease your pain.  RISKS AND COMPLICATIONS If you have any of the following conditions, do not use heat therapy unless your health  care provider has approved:  Poor circulation.  Healing wounds or scarred skin in the  area being treated.  Diabetes, heart disease, or high blood pressure.  Not being able to feel (numbness) the area being treated.  Unusual swelling of the area being treated.  Active infections.  Blood clots.  Cancer.  Inability to communicate pain. This may include young children and people who have problems with their brain function (dementia).  Pregnancy. Heat therapy should only be used on old, pre-existing, or long-lasting (chronic) injuries. Do not use heat therapy on new injuries unless directed by your health care provider. HOW TO USE HEAT THERAPY There are several different kinds of heat therapy, including:  Moist heat pack.  Warm water bath.  Hot water bottle.  Electric heating pad.  Heated gel pack.  Heated wrap.  Electric heating pad. Use the heat therapy method suggested by your health care provider. Follow your health care provider's instructions on when and how to use heat therapy. GENERAL HEAT THERAPY RECOMMENDATIONS  Do not sleep while using heat therapy. Only use heat therapy while you are awake.  Your skin may turn pink while using heat therapy. Do not use heat therapy if your skin turns red.  Do not use heat therapy if you have new pain.  High heat or long exposure to heat can cause burns. Be careful when using heat therapy to avoid burning your skin.  Do not use heat therapy on areas of your skin that are already irritated, such as with a rash or sunburn. SEEK MEDICAL CARE IF:  You have blisters, redness, swelling, or numbness.  You have new pain.  Your pain is worse. MAKE SURE YOU:  Understand these instructions.  Will watch your condition.  Will get help right away if you are not doing well or get worse.   This information is not intended to replace advice given to you by your health care provider. Make sure you discuss any questions you have  with your health care provider.   Document Released: 10/17/2011 Document Revised: 08/15/2014 Document Reviewed: 09/17/2013 Elsevier Interactive Patient Education [employer].

## 2015-10-10 NOTE — ED Notes (Signed)
C/o burning pain to R hip, unable to lift R leg, and dragging R foot since yesterday.  Reports history of hip surgery 8 yrs ago.  Relates pain to lifting her twin grandchildren.  Pt has fever of 100- denies any URI symptoms or any other known cause for fever.

## 2015-10-10 NOTE — ED Provider Notes (Signed)
CSN: [id number]     Arrival date & time 10/10/15  0000 History  By signing my name below, I, Hansel Feinstein, attest that this documentation has been prepared under the direction and in the presence of Lajean Saver, MD. Electronically Signed: Hansel Feinstein, ED Scribe. 10/10/2015. 1:49 AM.    Chief Complaint  Patient presents with  . Hip Pain   The history is provided by the patient. No language interpreter was used.   HPI Comments: [name] is a 60 y.o. female with h/o PE, CAD, chronic low back pain, end stage osteoarthritis, left-sided sciatica who presents to the Emergency Department complaining of moderate right hip pain with radiation to the right thigh onset 2 days ago. Pt states she has been doing a lot of bending and lifting while taking care of her grandchildren recently. She states her pain is worsened with movement and ambulation. No h/o back surgery. Pt notes she is followed by neurology for sciatica on the left side. She denies recent fall or acute trauma, numbness or paresthesia, fever, dysuria, hematuria, cough, congestion.  Pt denies fever or chills although t 100 in ED.  No gu c/o. No uri c/o. No abd pain.      Past Medical History  Diagnosis Date  . Other pulmonary embolism and infarction     Significant 2008 and coumadin therapy RV dysfunction...echo...2008..EF 50%..right ventricle markedly dilated w marked right ventricular dysfunc and moder tricuspid regurg/echo..March 2010, Ef 50%, mild dilation of right ventricule w mild decrease right ventric function   . Hypopotassemia   . Tobacco use disorder   . Anemia, unspecified   . Hypotension, unspecified   . Chronic low back pain   . CAD (coronary artery disease)     minimal catheterization, October 2008  . Chest pain     with stress  . Constipation     and diarrhea chronic..Dr. Alben Spittle  . Right bundle branch block     intermittent  . Leg pain   . Back pain   . Methadone adverse reaction     for chronic leg and  back pain  . 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  . Cellulitis   . Thyroid disease   . Peripheral vascular disease ([place])   . Venous insufficiency (chronic) (peripheral)     Patient's legs were wrapped 2013  . Diverticulosis of colon (without mention of hemorrhage)   . Benign neoplasm of colon 12/2007    Hyperplastic colon polyps   Past Surgical History  Procedure Laterality Date  . Cholecystectomy    . Gastric bypass    . Tonsillectomy    . Joint replacement      right total hip replacement  . Rotator cuff repair      right shoulder   Family History  Problem Relation Age of Onset  . Crohn's disease Sister   . Heart disease Mother   . Heart disease Father    Social History  Substance Use Topics  . Smoking status: Former Smoker    Types: Cigarettes    Quit date: 06/09/2011  . Smokeless tobacco: Never Used     Comment: 2 ciggs a day  . Alcohol Use: No   OB History    No data available     Review of Systems  Constitutional: Negative for chills and diaphoresis.  HENT: Negative for sore throat.  Eyes: Negative for redness.  Respiratory: Negative for shortness of breath.   Cardiovascular: Negative for chest pain and leg swelling.  Gastrointestinal: Negative for vomiting, abdominal pain and diarrhea.  Endocrine: Negative for polyuria.  Genitourinary: Negative for dysuria.  Musculoskeletal: Positive for myalgias, back pain and arthralgias. Negative for neck pain and neck stiffness.  Skin: Negative for rash.  Neurological: Negative for weakness, numbness and headaches.  Hematological: Does not bruise/bleed easily.  Psychiatric/Behavioral: Negative for confusion.   Allergies  Amitiza; Morphine; Nsaids; and Venlafaxine  Home Medications   Prior to Admission medications   Medication Sig Start Date End Date Taking? Authorizing Provider  b complex vitamins  tablet Take 1 tablet by mouth daily.    Historical Provider, MD  calcium carbonate 200 MG capsule Take 250 mg by mouth 2 (two) times daily with a meal.    Historical Provider, MD  Calcium Carbonate Antacid (ROLAIDS EXTRA STRENGTH) 1177 MG CHEW Chew 1 tablet by mouth as needed. For vomiting    Historical Provider, MD  HYDROmorphone (DILAUDID) 4 MG tablet  05/21/14   Historical Provider, MD  levothyroxine (SYNTHROID, LEVOTHROID) 125 MCG tablet TAKE 1 TABLET (125 MCG TOTAL) BY MOUTH DAILY BEFORE BREAKFAST. **PT NEEDS A FOLLOW WITH DR Cruzita LedererMarland Kitchen 08/11/15   Philemon Kingdom, MD  magnesium hydroxide (MILK OF MAGNESIA) 400 MG/5ML suspension Take 30 mLs by mouth as needed. For nausea    Historical Provider, MD  methadone (DOLOPHINE) 10 MG tablet Take 20 mg by mouth 3 (three) times daily.     Historical Provider, MD  warfarin (COUMADIN) 5 MG tablet TAKE AS DIRECTED BY ANTICOAGULATION CLINIC 02/27/15   Carlena Bjornstad, MD   BP 114/62 mmHg  Pulse 85  Temp(Src) 100 F (37.8 C) (Oral)  Resp 18  SpO2 97% Physical Exam  Constitutional: She is oriented to person, place, and time. She appears well-developed and well-nourished. No distress.  HENT:  Head: Normocephalic and atraumatic.  Mouth/Throat: Oropharynx is clear and moist.  Eyes: Conjunctivae and EOM are normal. No scleral icterus.  Neck: Normal range of motion. Neck supple.  Cardiovascular: Normal rate, regular rhythm, normal heart sounds and intact distal pulses.  Exam reveals no gallop and no friction rub.   No murmur heard. Pulmonary/Chest: Effort normal and breath sounds normal. No respiratory distress.  Abdominal: Soft. Bowel sounds are normal. She exhibits no distension. There is no tenderness.  Genitourinary:  No cva tenderness  Musculoskeletal: Normal range of motion. She exhibits no edema or tenderness.  TLS spine non tender. Tenderness at right sciatic notch reproducing symptoms.  Pt w chronic venous stasis skin changes bil lower legs, w a  couple small ulcerated areas, draining small amt purulent material, ?miniam surrounding cellulitis.   Neurological: She is alert and oriented to person, place, and time.  Straight leg raise neg. Motor intact bil ext, str 5/5. sens grossly intact.   Skin: Skin is warm and dry. No rash noted. She is not diaphoretic.  Psychiatric: She has a normal mood and affect. Her behavior is normal.  Nursing note and vitals reviewed.   ED Course  Procedures (including critical care time) DIAGNOSTIC STUDIES: Oxygen Saturation is 97% on RA, normal by my interpretation.    COORDINATION OF CARE: 1:42 AM Discussed treatment plan with pt at bedside which includes lab work, XR and pt agreed to plan.   Labs Review  Results for orders placed or performed during the hospital encounter of 10/10/15  Comprehensive metabolic panel  Result Value Ref Range  Sodium 136 135 - 145 mmol/L   Potassium 3.7 3.5 - 5.1 mmol/L   Chloride 99 (L) 101 - 111 mmol/L   CO2 28 22 - 32 mmol/L   Glucose, Bld 103 (H) 65 - 99 mg/dL   BUN 9 6 - 20 mg/dL   Creatinine, Ser 0.77 0.44 - 1.00 mg/dL   Calcium 8.9 8.9 - 10.3 mg/dL   Total Protein 7.2 6.5 - 8.1 g/dL   Albumin 3.2 (L) 3.5 - 5.0 g/dL   AST 18 15 - 41 U/L   ALT 9 (L) 14 - 54 U/L   Alkaline Phosphatase 74 38 - 126 U/L   Total Bilirubin 0.5 0.3 - 1.2 mg/dL   GFR calc non Af Amer >60 >60 mL/min   GFR calc Af Amer >60 >60 mL/min   Anion gap 9 5 - 15  CBC with Differential  Result Value Ref Range   WBC 3.9 (L) 4.0 - 10.5 K/uL   RBC 4.11 3.87 - 5.11 MIL/uL   Hemoglobin 11.4 (L) 12.0 - 15.0 g/dL   HCT 34.9 (L) 36.0 - 46.0 %   MCV 84.9 78.0 - 100.0 fL   MCH 27.7 26.0 - 34.0 pg   MCHC 32.7 30.0 - 36.0 g/dL   RDW 15.2 11.5 - 15.5 %   Platelets 213 150 - 400 K/uL   Neutrophils Relative % 75 %   Neutro Abs 3.0 1.7 - 7.7 K/uL   Lymphocytes Relative 18 %   Lymphs Abs 0.7 0.7 - 4.0 K/uL   Monocytes Relative 6 %   Monocytes Absolute 0.2 0.1 - 1.0 K/uL   Eosinophils  Relative 1 %   Eosinophils Absolute 0.0 0.0 - 0.7 K/uL   Basophils Relative 0 %   Basophils Absolute 0.0 0.0 - 0.1 K/uL  Urinalysis, Routine w reflex microscopic (not at [facility])  Result Value Ref Range   Color, Urine YELLOW YELLOW   APPearance CLEAR CLEAR   Specific Gravity, Urine 1.009 1.005 - 1.030   pH 7.5 5.0 - 8.0   Glucose, UA NEGATIVE NEGATIVE mg/dL   Hgb urine dipstick MODERATE (A) NEGATIVE   Bilirubin Urine NEGATIVE NEGATIVE   Ketones, ur NEGATIVE NEGATIVE mg/dL   Protein, ur NEGATIVE NEGATIVE mg/dL   Nitrite NEGATIVE NEGATIVE   Leukocytes, UA SMALL (A) NEGATIVE  Urine microscopic-add on  Result Value Ref Range   Squamous Epithelial / LPF 0-5 (A) NONE SEEN   WBC, UA 0-5 0 - 5 WBC/hpf   RBC / HPF 6-30 0 - 5 RBC/hpf   Bacteria, UA RARE (A) NONE SEEN  I-Stat CG4 Lactic Acid, ED (Not at [facility])  Result Value Ref Range   Lactic Acid, Venous 0.87 0.5 - 2.0 mmol/L   Dg Chest 2 View  10/10/2015  CLINICAL DATA:  60 year old female with fever and chest pain EXAM: CHEST  2 VIEW COMPARISON:  Radiograph dated 07/15/2014 FINDINGS: Two views of the chest demonstrate emphysematous changes of the lungs. There is no focal consolidation, pleural effusion, or pneumothorax. The cardiac silhouette is within normal limits. The bones are osteopenic. No acute osseous pathology. IMPRESSION: No active cardiopulmonary disease. Electronically Signed   By: Anner Crete M.D.   On: 10/10/2015 01:25       I have personally reviewed and evaluated these images and lab results as part of my medical decision-making.   MDM   I personally performed the services described in this documentation, which was scribed in my presence. The recorded information has been reviewed and considered.  Lajean Saver, MD   Reviewed nursing notes and prior charts for additional history.    Pt with hx ddd, back and radicular pain, spinal sten, c/o increased right low back, right sciatic notch area pain.    No  fevers.  Prednisone po.  Hydrocodone po.  Recheck pt comfortable. Nad.   Pt with infected ulcerated areas lower legs/ankles, given low grade fever, will rx abx.   Pt currently appears stable for d/c.      Lajean Saver, MD 10/10/15 [phone]

## 2015-10-11 LAB — URINE CULTURE

## 2015-10-12 ENCOUNTER — Emergency Department (HOSPITAL_COMMUNITY): Payer: Medicare Other

## 2015-10-12 ENCOUNTER — Emergency Department (HOSPITAL_COMMUNITY)
Admission: EM | Admit: 2015-10-12 | Discharge: 2015-10-13 | Disposition: A | Discharge status: Home / Self Care | Payer: Medicare Other | Attending: Emergency Medicine | Admitting: Emergency Medicine

## 2015-10-12 ENCOUNTER — Encounter (HOSPITAL_COMMUNITY): Payer: Self-pay | Admitting: Family Medicine

## 2015-10-12 DIAGNOSIS — S79911A Unspecified injury of right hip, initial encounter: Secondary | ICD-10-CM | POA: Insufficient documentation

## 2015-10-12 DIAGNOSIS — W16212A Fall in (into) filled bathtub causing other injury, initial encounter: Secondary | ICD-10-CM | POA: Insufficient documentation

## 2015-10-12 DIAGNOSIS — Z862 Personal history of diseases of the blood and blood-forming organs and certain disorders involving the immune mechanism: Secondary | ICD-10-CM | POA: Diagnosis not present

## 2015-10-12 DIAGNOSIS — Y9289 Other specified places as the place of occurrence of the external cause: Secondary | ICD-10-CM | POA: Diagnosis not present

## 2015-10-12 DIAGNOSIS — G8929 Other chronic pain: Secondary | ICD-10-CM | POA: Insufficient documentation

## 2015-10-12 DIAGNOSIS — M25551 Pain in right hip: Secondary | ICD-10-CM | POA: Diagnosis not present

## 2015-10-12 DIAGNOSIS — E079 Disorder of thyroid, unspecified: Secondary | ICD-10-CM | POA: Insufficient documentation

## 2015-10-12 DIAGNOSIS — Z87891 Personal history of nicotine dependence: Secondary | ICD-10-CM | POA: Insufficient documentation

## 2015-10-12 DIAGNOSIS — Z96641 Presence of right artificial hip joint: Secondary | ICD-10-CM | POA: Diagnosis not present

## 2015-10-12 DIAGNOSIS — I2510 Atherosclerotic heart disease of native coronary artery without angina pectoris: Secondary | ICD-10-CM | POA: Diagnosis not present

## 2015-10-12 DIAGNOSIS — Y9389 Activity, other specified: Secondary | ICD-10-CM | POA: Insufficient documentation

## 2015-10-12 DIAGNOSIS — Z79899 Other long term (current) drug therapy: Secondary | ICD-10-CM | POA: Insufficient documentation

## 2015-10-12 DIAGNOSIS — Y998 Other external cause status: Secondary | ICD-10-CM | POA: Diagnosis not present

## 2015-10-12 DIAGNOSIS — Z86018 Personal history of other benign neoplasm: Secondary | ICD-10-CM | POA: Insufficient documentation

## 2015-10-12 MED ORDER — OXYCODONE-ACETAMINOPHEN 5-325 MG PO TABS
1.0000 | ORAL_TABLET | Freq: Once | ORAL | Status: AC
  Administered 2015-10-12: 1 via ORAL

## 2015-10-12 MED ORDER — OXYCODONE-ACETAMINOPHEN 5-325 MG PO TABS
ORAL_TABLET | ORAL | Status: AC
  Filled 2015-10-12: qty 1

## 2015-10-12 NOTE — ED Notes (Signed)
Pt here for right hip pain. sts was seen here the other day and dx with sciatic nerve pain. sts that she fell getting on the toilet today and injured her hip again.

## 2015-10-13 MED ORDER — METHOCARBAMOL 500 MG PO TABS: 500.0000 mg | ORAL_TABLET | Freq: Four times a day (QID) | ORAL | Status: DC | PRN

## 2015-10-13 MED ORDER — HYDROCODONE-ACETAMINOPHEN 5-325 MG PO TABS
1.0000 | ORAL_TABLET | Freq: Once | ORAL | Status: AC
  Administered 2015-10-13: 1 via ORAL
  Filled 2015-10-13: qty 1

## 2015-10-13 NOTE — ED Provider Notes (Signed)
CSN: [id number]     Arrival date & time 10/12/15  1711 History   First MD Initiated Contact with Patient 10/12/15 2351     Chief Complaint  Patient presents with  . Hip Pain   (Consider location/radiation/quality/duration/timing/severity/associated sxs/prior Treatment) HPI 60 y.o. female with a hx of PE, CAD, Chronic low back pain, end stage osteoarthritis, left-sided sciatica, presents to the Emergency Department today complaining of right hip pain after fall. Pt notes that she slipped in her bathtub earlier this afternoon around 3pm. [place] and hit her right hip on the edge of the tub. No head trauma. No LOC. Notes pain is 10/10 and sharp. Constant. Seen recently in ED on 10-10-15 for right hip pain with sciatica. Given Rx narcotics, prednisone and follow up with Neurosurgery tomorrow. No CP/SOB/ABD pain. No numbness or paresthesia, fever, dysuria, hematuria, cough, congestion.   Past Medical History  Diagnosis Date  . Other pulmonary embolism and infarction     Significant 2008 and coumadin therapy RV dysfunction...echo...2008..EF 50%..right ventricle markedly dilated w marked right ventricular dysfunc and moder tricuspid regurg/echo..March 2010, Ef 50%, mild dilation of right ventricule w mild decrease right ventric function   . Hypopotassemia   . Tobacco use disorder   . Anemia, unspecified   . Hypotension, unspecified   . Chronic low back pain   . CAD (coronary artery disease)     minimal catheterization, October 2008  . Chest pain     with stress  . Constipation     and diarrhea chronic..Dr. Alben Spittle  . Right bundle branch block     intermittent  . Leg pain   . Back pain   . Methadone adverse reaction     for chronic leg and back pain  . 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  . Cellulitis   . Thyroid disease   . Peripheral vascular disease ([place])   . Venous  insufficiency (chronic) (peripheral)     Patient's legs were wrapped 2013  . Diverticulosis of colon (without mention of hemorrhage)   . Benign neoplasm of colon 12/2007    Hyperplastic colon polyps   Past Surgical History  Procedure Laterality Date  . Cholecystectomy    . Gastric bypass    . Tonsillectomy    . Joint replacement      right total hip replacement  . Rotator cuff repair      right shoulder   Family History  Problem Relation Age of Onset  . Crohn's disease Sister   . Heart disease Mother   . Heart disease Father    Social History  Substance Use Topics  . Smoking status: Former Smoker    Types: Cigarettes    Quit date: 06/09/2011  . Smokeless tobacco: Never Used     Comment: 2 ciggs a day  . Alcohol Use: No   OB History    No data available     Review of Systems ROS reviewed and all are negative for acute change except as noted in the HPI.  Allergies  Amitiza; Morphine; Nsaids; and Venlafaxine  Home Medications   Prior to Admission medications   Medication Sig Start Date End Date Taking? Authorizing Provider  calcium carbonate 200 MG capsule Take 250 mg by mouth 2 (two) times daily with a meal.    Historical Provider, MD  Calcium Carbonate Antacid (ROLAIDS EXTRA STRENGTH) 1177  MG CHEW Chew 1 tablet by mouth 2 (two) times daily as needed (heartburn).     Historical Provider, MD  cephALEXin (KEFLEX) 500 MG capsule Take 1 capsule (500 mg total) by mouth 4 (four) times daily. 10/10/15   Lajean Saver, MD  HYDROcodone-acetaminophen (NORCO/VICODIN) 5-325 MG tablet Take 1-2 tablets by mouth every 6 (six) hours as needed for moderate pain. 10/10/15   Lajean Saver, MD  HYDROmorphone (DILAUDID) 4 MG tablet Take 4-8 mg by mouth every 6 (six) hours as needed for moderate pain.  05/21/14   Historical Provider, MD  levothyroxine (SYNTHROID, LEVOTHROID) 125 MCG tablet TAKE 1 TABLET (125 MCG TOTAL) BY MOUTH DAILY BEFORE BREAKFAST. **PT NEEDS A FOLLOW WITH DR Cruzita Lederer** 08/11/15    Philemon Kingdom, MD  magnesium hydroxide (MILK OF MAGNESIA) 400 MG/5ML suspension Take 30 mLs by mouth daily as needed for mild constipation, heartburn or indigestion.     Historical Provider, MD  methadone (DOLOPHINE) 10 MG tablet Take 20 mg by mouth 3 (three) times daily.     Historical Provider, MD  predniSONE (DELTASONE) 20 MG tablet 3 po once a day for 2 days, then 2 po once a day for 2 days, then 1 po once a day for 2 days 10/10/15   Lajean Saver, MD  warfarin (COUMADIN) 5 MG tablet TAKE AS DIRECTED BY ANTICOAGULATION CLINIC Patient taking differently: Take 5mg  on Tuesday, Friday and Sunday. All other days take 10mg  02/27/15   Carlena Bjornstad, MD   BP 113/74 mmHg  Pulse 56  Temp(Src) 98.6 F (37 C) (Oral)  Resp 18  SpO2 100%   Physical Exam  Constitutional: She is oriented to person, place, and time. She appears well-developed and well-nourished.  HENT:  Head: Normocephalic and atraumatic.  Eyes: EOM are normal. Pupils are equal, round, and reactive to light.  Neck: Normal range of motion. Neck supple. No tracheal deviation present.  Cardiovascular: Normal rate, regular rhythm and normal heart sounds.   Pulmonary/Chest: Effort normal and breath sounds normal.  Abdominal: Soft.  Musculoskeletal:       Right hip: She exhibits decreased range of motion and tenderness. She exhibits no bony tenderness, no swelling, no crepitus, no deformity and no laceration.  Motor intact BLE. Str 5/5. Sensory intact. Distal pulses appreciated  Neurological: She is alert and oriented to person, place, and time.  Skin: Skin is warm and dry.  Psychiatric: She has a normal mood and affect. Her behavior is normal. Thought content normal.  Nursing note and vitals reviewed.  ED Course  Procedures (including critical care time) Labs Review Labs Reviewed - No data to display  Imaging Review Dg Hip Unilat  With Pelvis 2-3 Views Right  10/12/2015  CLINICAL DATA:  Right hip pain for 3 days, fell today EXAM:  DG HIP (WITH OR WITHOUT PELVIS) 2-3V RIGHT COMPARISON:  None. FINDINGS: There is a unipolar right hip replacement in anticipated position. The pelvic bones are intact. Native femur is intact as is the implant. IMPRESSION: No acute findings Electronically Signed   By: Skipper Cliche M.D.   On: 10/12/2015 18:20   I have personally reviewed and evaluated these images and lab results as part of my medical decision-making.   EKG Interpretation None      MDM  I have reviewed and evaluated the relevant laboratory values.I have reviewed and evaluated the relevant imaging studies.I have reviewed the relevant previous healthcare records. I obtained HPI from historian. Patient discussed with supervising physician  ED Course:  Assessment: 59y F hx PE, CAD, Chronic low back pain, end stage osteoarthritis, left-sided sciatica presents after fall on right hip. On exam, pt in mild distress. VSS. Afebrile. Lungs CTA. Heart RRR. TTP on right hip. Pain with ROM. No ecchymosis noted. Given analgesia in ED. XR showed no dislocation of total hip replacement. No Fracture. Patient currently has Rx narcotics from previous visit and currently finishing steroids. Will give Rx robaxin. Has appt tomorrow with neurosurgery for follow up on Sciatica. At time of discharge, Patient is in no acute distress. Vital Signs are stable. Patient is able to ambulate. Patient able to tolerate PO.   Disposition/Plan:  DC Home Additional Verbal discharge instructions given and discussed with patient.  Pt Instructed to f/u with Neurosurgery tomorrow at scheduled appointment  Return precautions given Pt acknowledges and agrees with plan  Supervising Physician Everlene Balls, MD    Final diagnoses:  Right hip pain       Shary Decamp, PA-C 10/13/15 0102  Everlene Balls, MD 10/13/15 [phone]

## 2015-10-13 NOTE — Discharge Instructions (Signed)
Please read and follow all provided instructions.  Your diagnoses today include:  1. Right hip pain    Tests performed today include:  Vital signs. See below for your results today.   Medications prescribed:   Take any medication as prescribed    Home care instructions:  Follow any educational materials contained in this packet.  Follow-up instructions: Please follow-up with your neurosurgeon tomorrow at your scheduled appointment    Return instructions:   Please return to the Emergency Department if you do not get better, if you get worse, or new symptoms OR  - Fever (temperature greater than 101.49F)  - Bleeding that does not stop with holding pressure to the area    -Severe pain (please note that you may be more sore the day after your accident)  - Chest Pain  - Difficulty breathing  - Severe nausea or vomiting  - Inability to tolerate food and liquids  - Passing out  - Skin becoming red around your wounds  - Change in mental status (confusion or lethargy)  - New numbness or weakness     Please return if you have any other emergent concerns.  Additional Information:  Your vital signs today were: BP 113/74 mmHg   Pulse 56   Temp(Src) 98.6 F (37 C) (Oral)   Resp 18   SpO2 100% If your blood pressure (BP) was elevated above 135/85 this visit, please have this repeated by your doctor within one month. ---------------

## 2015-10-15 ENCOUNTER — Ambulatory Visit (INDEPENDENT_AMBULATORY_CARE_PROVIDER_SITE_OTHER): Payer: Medicare Other | Admitting: Pharmacist

## 2015-10-15 DIAGNOSIS — I2699 Other pulmonary embolism without acute cor pulmonale: Secondary | ICD-10-CM

## 2015-10-15 DIAGNOSIS — Z86711 Personal history of pulmonary embolism: Secondary | ICD-10-CM | POA: Diagnosis not present

## 2015-10-15 LAB — POCT INR: INR: 5.4

## 2015-10-19 ENCOUNTER — Other Ambulatory Visit: Payer: Self-pay | Admitting: Cardiology

## 2015-10-22 ENCOUNTER — Ambulatory Visit (INDEPENDENT_AMBULATORY_CARE_PROVIDER_SITE_OTHER): Payer: Medicare Other | Admitting: Pharmacist

## 2015-10-22 DIAGNOSIS — Z86711 Personal history of pulmonary embolism: Secondary | ICD-10-CM

## 2015-10-22 DIAGNOSIS — I2699 Other pulmonary embolism without acute cor pulmonale: Secondary | ICD-10-CM | POA: Diagnosis not present

## 2015-10-22 LAB — POCT INR: INR: 1.2

## 2015-11-03 ENCOUNTER — Encounter: Payer: Medicare Other | Admitting: Cardiology

## 2016-05-17 ENCOUNTER — Telehealth: Payer: Self-pay | Admitting: *Deleted

## 2016-05-17 NOTE — Telephone Encounter (Signed)
Pt has not been seen in the [facility]madin Clinic since March 2017 & the pt has not seen a Cardiologist in over a year.  She states she stopped her Coumadin months ago on her own & in the past she has been noncompliant. Pt needs to see a cardiologist & become re-established before Coumadin Clinic can see the pt. She understands she needs to become compliant with taking Coumadin & adhere to appts.

## 2016-07-13 ENCOUNTER — Other Ambulatory Visit: Payer: Self-pay

## 2016-07-13 ENCOUNTER — Observation Stay (HOSPITAL_COMMUNITY)
Admission: EM | Admit: 2016-07-13 | Discharge: 2016-07-15 | Disposition: A | Discharge status: Home / Self Care | Payer: Medicare Other | Attending: Internal Medicine | Admitting: Internal Medicine

## 2016-07-13 ENCOUNTER — Emergency Department (HOSPITAL_COMMUNITY): Payer: Medicare Other

## 2016-07-13 ENCOUNTER — Encounter (HOSPITAL_COMMUNITY): Payer: Self-pay

## 2016-07-13 DIAGNOSIS — R531 Weakness: Secondary | ICD-10-CM | POA: Diagnosis not present

## 2016-07-13 DIAGNOSIS — E039 Hypothyroidism, unspecified: Secondary | ICD-10-CM | POA: Diagnosis not present

## 2016-07-13 DIAGNOSIS — M25552 Pain in left hip: Secondary | ICD-10-CM | POA: Diagnosis not present

## 2016-07-13 DIAGNOSIS — I872 Venous insufficiency (chronic) (peripheral): Secondary | ICD-10-CM | POA: Insufficient documentation

## 2016-07-13 DIAGNOSIS — Z23 Encounter for immunization: Secondary | ICD-10-CM | POA: Insufficient documentation

## 2016-07-13 DIAGNOSIS — Z86010 Personal history of colonic polyps: Secondary | ICD-10-CM | POA: Insufficient documentation

## 2016-07-13 DIAGNOSIS — Z885 Allergy status to narcotic agent status: Secondary | ICD-10-CM | POA: Diagnosis not present

## 2016-07-13 DIAGNOSIS — R2689 Other abnormalities of gait and mobility: Secondary | ICD-10-CM | POA: Insufficient documentation

## 2016-07-13 DIAGNOSIS — R001 Bradycardia, unspecified: Secondary | ICD-10-CM | POA: Insufficient documentation

## 2016-07-13 DIAGNOSIS — Y998 Other external cause status: Secondary | ICD-10-CM | POA: Insufficient documentation

## 2016-07-13 DIAGNOSIS — I340 Nonrheumatic mitral (valve) insufficiency: Secondary | ICD-10-CM | POA: Diagnosis not present

## 2016-07-13 DIAGNOSIS — R609 Edema, unspecified: Secondary | ICD-10-CM | POA: Insufficient documentation

## 2016-07-13 DIAGNOSIS — M545 Low back pain: Secondary | ICD-10-CM | POA: Insufficient documentation

## 2016-07-13 DIAGNOSIS — Z888 Allergy status to other drugs, medicaments and biological substances status: Secondary | ICD-10-CM | POA: Diagnosis not present

## 2016-07-13 DIAGNOSIS — M25551 Pain in right hip: Secondary | ICD-10-CM | POA: Insufficient documentation

## 2016-07-13 DIAGNOSIS — R296 Repeated falls: Secondary | ICD-10-CM

## 2016-07-13 DIAGNOSIS — I4510 Unspecified right bundle-branch block: Secondary | ICD-10-CM | POA: Insufficient documentation

## 2016-07-13 DIAGNOSIS — Y93E8 Activity, other personal hygiene: Secondary | ICD-10-CM | POA: Insufficient documentation

## 2016-07-13 DIAGNOSIS — Z86711 Personal history of pulmonary embolism: Secondary | ICD-10-CM | POA: Diagnosis present

## 2016-07-13 DIAGNOSIS — E785 Hyperlipidemia, unspecified: Secondary | ICD-10-CM | POA: Insufficient documentation

## 2016-07-13 DIAGNOSIS — W1809XA Striking against other object with subsequent fall, initial encounter: Secondary | ICD-10-CM | POA: Insufficient documentation

## 2016-07-13 DIAGNOSIS — R55 Syncope and collapse: Secondary | ICD-10-CM | POA: Diagnosis not present

## 2016-07-13 DIAGNOSIS — Y92003 Bedroom of unspecified non-institutional (private) residence as the place of occurrence of the external cause: Secondary | ICD-10-CM | POA: Insufficient documentation

## 2016-07-13 DIAGNOSIS — Z96641 Presence of right artificial hip joint: Secondary | ICD-10-CM | POA: Insufficient documentation

## 2016-07-13 DIAGNOSIS — J449 Chronic obstructive pulmonary disease, unspecified: Secondary | ICD-10-CM | POA: Insufficient documentation

## 2016-07-13 DIAGNOSIS — Z7901 Long term (current) use of anticoagulants: Secondary | ICD-10-CM | POA: Insufficient documentation

## 2016-07-13 DIAGNOSIS — Z9884 Bariatric surgery status: Secondary | ICD-10-CM | POA: Diagnosis not present

## 2016-07-13 DIAGNOSIS — R52 Pain, unspecified: Secondary | ICD-10-CM

## 2016-07-13 DIAGNOSIS — I7025 Atherosclerosis of native arteries of other extremities with ulceration: Secondary | ICD-10-CM | POA: Insufficient documentation

## 2016-07-13 DIAGNOSIS — G894 Chronic pain syndrome: Secondary | ICD-10-CM | POA: Diagnosis not present

## 2016-07-13 DIAGNOSIS — I2510 Atherosclerotic heart disease of native coronary artery without angina pectoris: Secondary | ICD-10-CM | POA: Diagnosis not present

## 2016-07-13 DIAGNOSIS — I2782 Chronic pulmonary embolism: Secondary | ICD-10-CM

## 2016-07-13 DIAGNOSIS — Z8719 Personal history of other diseases of the digestive system: Secondary | ICD-10-CM | POA: Insufficient documentation

## 2016-07-13 DIAGNOSIS — Z87891 Personal history of nicotine dependence: Secondary | ICD-10-CM | POA: Insufficient documentation

## 2016-07-13 DIAGNOSIS — J439 Emphysema, unspecified: Secondary | ICD-10-CM | POA: Diagnosis present

## 2016-07-13 HISTORY — DX: Unspecified urinary incontinence: R32

## 2016-07-13 HISTORY — DX: Depression, unspecified: F32.A

## 2016-07-13 HISTORY — DX: Gastro-esophageal reflux disease without esophagitis: K21.9

## 2016-07-13 HISTORY — DX: Unspecified asthma, uncomplicated: J45.909

## 2016-07-13 HISTORY — DX: Personal history of other medical treatment: Z92.89

## 2016-07-13 HISTORY — DX: Acute embolism and thrombosis of unspecified deep veins of unspecified lower extremity: I82.409

## 2016-07-13 HISTORY — DX: Family history of other specified conditions: Z84.89

## 2016-07-13 HISTORY — DX: Anxiety disorder, unspecified: F41.9

## 2016-07-13 HISTORY — DX: Unspecified osteoarthritis, unspecified site: M19.90

## 2016-07-13 HISTORY — DX: Major depressive disorder, single episode, unspecified: F32.9

## 2016-07-13 LAB — PROTIME-INR
INR: 1.09
PROTHROMBIN TIME: 14.1 s (ref 11.4–15.2)

## 2016-07-13 LAB — CBC
HCT: 34.7 % — ABNORMAL LOW (ref 36.0–46.0)
Hemoglobin: 10.9 g/dL — ABNORMAL LOW (ref 12.0–15.0)
MCH: 27.1 pg (ref 26.0–34.0)
MCHC: 31.4 g/dL (ref 30.0–36.0)
MCV: 86.3 fL (ref 78.0–100.0)
PLATELETS: 229 10*3/uL (ref 150–400)
RBC: 4.02 MIL/uL (ref 3.87–5.11)
RDW: 15.3 % (ref 11.5–15.5)
WBC: 4.2 10*3/uL (ref 4.0–10.5)

## 2016-07-13 LAB — BRAIN NATRIURETIC PEPTIDE: B Natriuretic Peptide: 29.2 pg/mL (ref 0.0–100.0)

## 2016-07-13 LAB — BASIC METABOLIC PANEL
Anion gap: 7 (ref 5–15)
BUN: 6 mg/dL (ref 6–20)
CALCIUM: 9.4 mg/dL (ref 8.9–10.3)
CHLORIDE: 102 mmol/L (ref 101–111)
CO2: 30 mmol/L (ref 22–32)
CREATININE: 0.63 mg/dL (ref 0.44–1.00)
GFR calc non Af Amer: >60 mL/min (ref >60)
GLUCOSE: 96 mg/dL (ref 65–99)
Potassium: 3.6 mmol/L (ref 3.5–5.1)
Sodium: 139 mmol/L (ref 135–145)

## 2016-07-13 LAB — D-DIMER, QUANTITATIVE (NOT AT ARMC): D DIMER QUANT: 0.82 ug{FEU}/mL — AB (ref 0.00–0.50)

## 2016-07-13 LAB — TSH: TSH: 18.172 u[IU]/mL — AB (ref 0.350–4.500)

## 2016-07-13 LAB — MAGNESIUM: Magnesium: 1.7 mg/dL (ref 1.7–2.4)

## 2016-07-13 MED ORDER — IOPAMIDOL (ISOVUE-370) INJECTION 76%
INTRAVENOUS | Status: AC
  Administered 2016-07-13: 100 mL via INTRAVENOUS
  Filled 2016-07-13: qty 100

## 2016-07-13 MED ORDER — SODIUM CHLORIDE 0.9 % IV BOLUS (SEPSIS)
500.0000 mL | Freq: Once | INTRAVENOUS | Status: AC
  Administered 2016-07-13: 500 mL via INTRAVENOUS

## 2016-07-13 NOTE — ED Triage Notes (Signed)
Patient states she has developed dizziness for the past 3 days, also complains of 2 falls the past 3 days. Today complains of left sided weakness and arm pain. Alert and oriented on arrival. No chest pain, no shortness of breath. Has not taken her coumadin since may, was taking for PE's.  NAD

## 2016-07-13 NOTE — ED Notes (Signed)
MD at bedside. 

## 2016-07-13 NOTE — ED Notes (Addendum)
Pt. Going to x-ray/CT scan

## 2016-07-13 NOTE — ED Provider Notes (Signed)
[name] Provider Note   CSN: [id number] Arrival date & time: 07/13/16  1713     History   Chief Complaint Chief Complaint  Patient presents with  . Fall x 3/dizziness    HPI [name] is a 60 y.o. female.  HPI Patient is a 60 year old female past medical history of CAD, hypothyroidism, prior PEs (supposed to be on Coumadin, but not currently taking it), chronic venous insufficiency who presents after multiple falls at home. Patient has been under a lot of stress over the past few months as she has been the primary caregiver for her grandchildren while her daughter is recovering from a car accident. Patient states that her initial fall was 3 days ago. She states she got up too quickly pick up the baby and felt weak, dizzy and fell. The second episode was last night and patient tripped over a mattress when she got out of bed to go to the bathroom. The third episode occurred today when patient was attempting to use the bathroom. She became very dizzy and tried to sit on the toilet but missed the toilet and fell to the ground.  She then noticed some numbness and tingling in her right arm after pulling herself up off the floor. She denies loss of consciousness throughout any of these episodes. She has associated generalized fatigue, dizziness and generalized weakness. She reports that when she falls she requires assistance to get up off the floor. She's also had poor by mouth intake over past few days. Patient states she is supposed to take a medicine for her thyroid and Coumadin for history of PEs but she has not been taking these medicines for several months as she's been unable to make her appointments.  Past Medical History:  Diagnosis Date  . Anemia, unspecified   . Back pain   . Benign neoplasm of colon 12/2007   Hyperplastic colon polyps  . CAD (coronary artery disease)    minimal catheterization, October 2008  . Cellulitis   . Chest pain    with stress  . Chronic  low back pain   . Constipation    and diarrhea chronic..Dr. Alben Spittle  . Diverticulosis of colon (without mention of hemorrhage)   . 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 Hypopotassemia   . Hypotension, unspecified   . Leg pain   . 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  . Methadone adverse reaction    for chronic leg and back pain  . Other pulmonary embolism and infarction    Significant 2008 and coumadin therapy RV dysfunction...echo...2008..EF 50%..right ventricle markedly dilated w marked right ventricular dysfunc and moder tricuspid regurg/echo..March 2010, Ef 50%, mild dilation of right ventricule w mild decrease right ventric function   . Peripheral vascular disease ([place])   . Right bundle branch block    intermittent  . Thyroid disease   . Tobacco use disorder   . Venous insufficiency (chronic) (peripheral)    Patient's legs were wrapped 2013    Patient Active Problem List   Diagnosis Date Noted  . Falls frequently 07/14/2016  . Hyperlipidemia 06/10/2014  . Severe obesity (BMI >= 40) ([place]) 06/10/2014  . Exposure to hepatitis C 06/10/2014  . CAD (coronary artery disease)   . Pain in limb-Bilateral Leg 06/27/2013  . Interstitial cystitis 01/31/2013  . Eustachian tube dysfunction 01/31/2013  . Chronic narcotic dependence ([place]) 01/30/2013  . S/P hip replacement 11/01/2012  .  Rectus sheath hematoma 07/13/2012  . Venous insufficiency (chronic) (peripheral)   . Chest pain, pleuritic 09/22/2011  . Urinary frequency 09/22/2011  . Atherosclerosis of native arteries of the extremities with ulceration(440.23) 07/21/2011  . Sinusitis 06/14/2011  . Esophageal spasm 06/14/2011  . Visual floaters 05/31/2011  . Neuropathic pain of lower extremity 05/31/2011  . General medical examination 05/16/2011  . Cellulitis 05/16/2011  . Constipation 05/12/2011  . 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  Hypothyroidism 01/19/2010  . LEG CRAMPS, NOCTURNAL 01/18/2010  . EDEMA 01/18/2010  . HYPOKALEMIA 08/10/2007  . ANEMIA, NORMOCYTIC 08/10/2007  . TOBACCO ABUSE 08/10/2007  . History of pulmonary embolism 08/10/2007  . 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 LOW BACK PAIN, CHRONIC 08/10/2007  . MEDIASTINAL ADENOPATHY CASTLEMANS D 08/10/2007    Past Surgical History:  Procedure Laterality Date  . CHOLECYSTECTOMY    . GASTRIC BYPASS    . JOINT REPLACEMENT     right total hip replacement  . ROTATOR CUFF REPAIR     right shoulder  . TONSILLECTOMY      OB History    No data available       Home Medications    Prior to Admission medications   Medication Sig Start Date End Date Taking? Authorizing Provider  cephALEXin (KEFLEX) 500 MG capsule Take 1 capsule (500 mg total) by mouth 4 (four) times daily. Patient not taking: Reported on 07/13/2016 10/10/15   Lajean Saver, MD  HYDROcodone-acetaminophen (NORCO/VICODIN) 5-325 MG tablet Take 1-2 tablets by mouth every 6 (six) hours as needed for moderate pain. Patient not taking: Reported on 07/13/2016 10/10/15   Lajean Saver, MD  levothyroxine (SYNTHROID, LEVOTHROID) 125 MCG tablet TAKE 1 TABLET (125 MCG TOTAL) BY MOUTH DAILY BEFORE BREAKFAST. **PT NEEDS A FOLLOW WITH DR Cruzita Lederer** Patient not taking: Reported on 07/13/2016 08/11/15   Philemon Kingdom, MD  methocarbamol (ROBAXIN) 500 MG tablet Take 1 tablet (500 mg total) by mouth every 6 (six) hours as needed for muscle spasms. Patient not taking: Reported on 07/13/2016 10/13/15   Shary Decamp, PA-C  predniSONE (DELTASONE) 20 MG tablet 3 po once a day for 2 days, then 2 po once a day for 2 days, then 1 po once a day for 2 days Patient not taking: Reported on 07/13/2016 10/10/15   Lajean Saver, MD  warfarin (COUMADIN) 5 MG tablet Take 1-2 tablets (5-10 mg total) by mouth daily. As directed by [facility] Patient not taking: Reported on 07/13/2016 10/19/15   Carlena Bjornstad, MD    Family History Family  History  Problem Relation Age of Onset  . Heart disease Mother   . Heart disease Father   . Crohn's disease Sister     Social History Social History  Substance Use Topics  . Smoking status: Former Smoker    Types: Cigarettes    Quit date: 06/09/2011  . Smokeless tobacco: Never Used     Comment: 2 ciggs a day  . Alcohol use No     Allergies   Amitiza [lubiprostone]; Morphine; Nsaids; and Venlafaxine   Review of Systems Review of Systems  Constitutional: Positive for appetite change and fatigue. Negative for chills and fever.  HENT: Negative for ear pain and sore throat.   Eyes: Negative for pain and visual disturbance.  Respiratory: Negative for cough and shortness of breath.   Cardiovascular: Negative for chest pain and palpitations.  Gastrointestinal: Negative for abdominal pain, diarrhea, nausea and vomiting.  Genitourinary: Negative for dysuria and hematuria.  Musculoskeletal: Negative for arthralgias and back pain.  Skin: Negative for color change and rash.  Neurological: Positive for dizziness, weakness and light-headedness. Negative for seizures, syncope and headaches.  All other systems reviewed and are negative.    Physical Exam Updated Vital Signs BP 104/68   Pulse 63   Temp 98.1 F (36.7 C) (Oral)   Resp 15   SpO2 95%   Physical Exam  Constitutional: She is oriented to person, place, and time. She appears well-developed.  Appears fatigued, chronically ill  HENT:  Head: Normocephalic and atraumatic.  Eyes: EOM are normal. Pupils are equal, round, and reactive to light.  Neck: Normal range of motion. Neck supple.  Pulmonary/Chest: Effort normal and breath sounds normal. No respiratory distress. She has no wheezes. She has no rales.  Abdominal: Soft. She exhibits no distension. There is no tenderness.  Musculoskeletal:  Bilateral lower extremity edema, with chronic venous stasis changes. There are ulcerations over bilateral medial shins.  5/5 strength  in bilateral upper and lower extremities. Sensation intact to light touch throughout  Neurological: She is alert and oriented to person, place, and time. No cranial nerve deficit or sensory deficit. She exhibits normal muscle tone. Coordination normal.  Skin: Skin is warm and dry. Capillary refill takes less than 2 seconds.  Scattered areas of skin breakdown under pannus  Psychiatric: She has a normal mood and affect.     ED Treatments / Results  Labs (all labs ordered are listed, but only abnormal results are displayed) Labs Reviewed  CBC - Abnormal; Notable for the following:       Result Value   Hemoglobin 10.9 (*)    HCT 34.7 (*)    All other components within normal limits  URINALYSIS, ROUTINE W REFLEX MICROSCOPIC - Abnormal; Notable for the following:    Hgb urine dipstick SMALL (*)    Leukocytes, UA MODERATE (*)    All other components within normal limits  TSH - Abnormal; Notable for the following:    TSH 18.172 (*)    All other components within normal limits  D-DIMER, QUANTITATIVE (NOT AT [facility]) - Abnormal; Notable for the following:    D-Dimer, Quant 0.82 (*)    All other components within normal limits  T4, FREE - Abnormal; Notable for the following:    Free T4 0.56 (*)    All other components within normal limits  URINALYSIS, MICROSCOPIC (REFLEX) - Abnormal; Notable for the following:    Bacteria, UA FEW (*)    Squamous Epithelial / LPF 0-5 (*)    All other components within normal limits  BASIC METABOLIC PANEL  PROTIME-INR  BRAIN NATRIURETIC PEPTIDE  RAPID URINE DRUG SCREEN, HOSP PERFORMED  MAGNESIUM    EKG  EKG Interpretation  Date/Time:  Wednesday July 13 2016 21:26:18 EST Ventricular Rate:  58 PR Interval:    QRS Duration: 110 QT Interval:  468 QTC Calculation: 460 R Axis:   28 Text Interpretation:  Sinus rhythm When compared to prior, no significant changes were seen.  No STEMI Confirmed by Sherry Ruffing MD, [place] [phone]) on 07/14/2016 12:31:25  AM       Radiology Dg Chest 2 View  Result Date: 07/13/2016 CLINICAL DATA:  Weakness and chills. Falling at home. Question pneumonia. EXAM: CHEST  2 VIEW COMPARISON:  Two-view chest x-ray 10/10/2015. FINDINGS: Heart is mildly enlarged. There is no edema or effusion to suggest failure. Emphysematous changes are again noted. No focal nodule, mass, or airspace disease is present. Mild degenerative changes  are noted in the thoracic spine. IMPRESSION: 1. Emphysema. 2. No acute cardiopulmonary disease. Electronically Signed   By: San Morelle M.D.   On: 07/13/2016 21:48   Ct Head Wo Contrast  Result Date: 07/13/2016 CLINICAL DATA:  Initial evaluation for acute left arm numbness and weakness. EXAM: CT HEAD WITHOUT CONTRAST TECHNIQUE: Contiguous axial images were obtained from the base of the skull through the vertex without intravenous contrast. COMPARISON:  None available. FINDINGS: Brain: Age appropriate cerebral atrophy. Minimal chronic microvascular ischemic disease. Focal 9 mm hypodensity within the inferior right lentiform nucleus favored to reflect a dilated perivascular space, although a remote lacunar infarct could conceivably be considered as well. This is not acute or subacute in appearance. No acute intracranial hemorrhage. No evidence for acute large vessel territory infarct. No mass lesion, midline shift or mass effect. No hydrocephalus. No extra-axial fluid collection. Vascular: No hyperdense vessel. Vascular calcifications noted within the carotid siphons. Skull: Scalp soft tissues demonstrate no acute abnormality. Calvarium intact. Sinuses/Orbits: Globes and orbital soft tissues within normal limits. Left frontoethmoidal sinus disease noted. Mild mucosal thickening within the maxillary sinuses. No mastoid effusion. IMPRESSION: 1. No acute intracranial process identified. 2. Left fronto ethmoidal sinus disease. Electronically Signed   By: Jeannine Boga M.D.   On: 07/13/2016 22:18     Ct Angio Chest Pe W And/or Wo Contrast  Result Date: 07/14/2016 CLINICAL DATA:  Syncope and elevated D-dimer. History pulmonary embolus. EXAM: CT ANGIOGRAPHY CHEST WITH CONTRAST TECHNIQUE: Multidetector CT imaging of the chest was performed using the standard protocol during bolus administration of intravenous contrast. Multiplanar CT image reconstructions and MIPs were obtained to evaluate the vascular anatomy. CONTRAST:  100 cc Isovue 370 IV COMPARISON:  Radiographs earlier this day. Most recent CT 01/26/2008 FINDINGS: Cardiovascular: There are no filling defects within the pulmonary arteries to suggest pulmonary embolus. No evidence of aortic dissection. A few coronary artery calcifications are present. Mild cardiomegaly. Mediastinum/Nodes: Small paratracheal and prevascular lymph nodes are unchanged from prior exam, no significant progression. No hilar adenopathy. No pericardial effusion. Lungs/Pleura: Diffuse faint ground-glass opacity throughout the lungs is unchanged, there is bronchial thickening. No confluent airspace disease. No pulmonary mass or suspicious nodule. No pleural fluid. Upper Abdomen: Post gastric bypass.  No acute abnormality. Musculoskeletal: Multilevel degenerative change in the spine. Chronic mild loss of height of T10 vertebra. There are no acute or suspicious osseous abnormalities. Review of the MIP images confirms the above findings. IMPRESSION: No pulmonary embolus or acute intrathoracic process. Minimal coronary artery calcifications. Chronic mild mediastinal adenopathy and chronic lung disease, stable dating back to 2009. Electronically Signed   By: Jeb Levering M.D.   On: 07/14/2016 00:02    Procedures Procedures (including critical care time)  Medications Ordered in ED Medications  sodium chloride 0.9 % bolus 500 mL (0 mLs Intravenous Stopped 07/14/16 0047)  iopamidol (ISOVUE-370) 76 % injection (100 mLs Intravenous Contrast Given 07/13/16 2333)     Initial  Impression / Assessment and Plan / ED Course  I have reviewed the triage vital signs and the nursing notes.  Pertinent labs & imaging results that were available during my care of the patient were reviewed by me and considered in my medical decision making (see chart for details).  Clinical Course    Patient is a 60 year old female past medical history as above who presents with generalized weakness and multiple falls at home. Afebrile, normotensive. Exam reveals poor hygiene, bilateral lower extremity edema. No focal neurologic deficits. No sensory  deficits in bilateral upper extremities. Orthostatic BP negative. EKG shows no acute ischemic changes. Labs significant for elevated TSH. Mild anemia that appears consistent with patient's baseline. No leukocytosis. BMP, Mag unremarkable. Patient has history of clotting and not currently taking Coumadin, raising concern for possible PE. D-dimer obtained and is elevated. CTA chest obtained and is negative for PE or other acute abnormality. UDS negative. UA is pending. Symptoms likely due to generalized deconditioning, medication non-adherence, and symptomatic hypothyroidism.  Hospitalist consulted for admission of patient. Patient will be admitted to telemetry observation for further management of generalized weakness, falls and symptomatic hypothyroidism.  Patient seen and discussed with Dr. Sherry Ruffing, ED attending   Final Clinical Impressions(s) / ED Diagnoses   Final diagnoses:  Multiple falls  Hypothyroidism, unspecified type  Generalized weakness    New Prescriptions New Prescriptions   No medications on file     Gibson Ramp, MD 07/14/16 0206    Gwenyth Allegra Tegeler, MD 07/14/16 2201

## 2016-07-13 NOTE — ED Notes (Signed)
Patient aware of need for urine sample, requesting to drink to help her go. MD sts okay for for pt to drink, provided her with water.

## 2016-07-13 NOTE — ED Notes (Signed)
Patient transported to CT 

## 2016-07-14 ENCOUNTER — Encounter (HOSPITAL_COMMUNITY): Payer: Self-pay | Admitting: Internal Medicine

## 2016-07-14 ENCOUNTER — Observation Stay (HOSPITAL_COMMUNITY): Payer: Medicare Other

## 2016-07-14 ENCOUNTER — Observation Stay (HOSPITAL_BASED_OUTPATIENT_CLINIC_OR_DEPARTMENT_OTHER): Payer: Medicare Other

## 2016-07-14 DIAGNOSIS — R531 Weakness: Secondary | ICD-10-CM | POA: Insufficient documentation

## 2016-07-14 DIAGNOSIS — E039 Hypothyroidism, unspecified: Secondary | ICD-10-CM

## 2016-07-14 DIAGNOSIS — M25551 Pain in right hip: Secondary | ICD-10-CM | POA: Diagnosis not present

## 2016-07-14 DIAGNOSIS — Z86711 Personal history of pulmonary embolism: Secondary | ICD-10-CM | POA: Diagnosis not present

## 2016-07-14 DIAGNOSIS — R55 Syncope and collapse: Secondary | ICD-10-CM | POA: Diagnosis not present

## 2016-07-14 DIAGNOSIS — M25552 Pain in left hip: Secondary | ICD-10-CM | POA: Diagnosis not present

## 2016-07-14 DIAGNOSIS — R296 Repeated falls: Secondary | ICD-10-CM | POA: Diagnosis not present

## 2016-07-14 LAB — CBC WITH DIFFERENTIAL/PLATELET
BASOS ABS: 0 10*3/uL (ref 0.0–0.1)
BASOS PCT: 1 %
EOS ABS: 0.1 10*3/uL (ref 0.0–0.7)
EOS PCT: 1 %
HCT: 32.9 % — ABNORMAL LOW (ref 36.0–46.0)
Hemoglobin: 10.5 g/dL — ABNORMAL LOW (ref 12.0–15.0)
Lymphocytes Relative: 27 %
Lymphs Abs: 1.4 10*3/uL (ref 0.7–4.0)
MCH: 27.4 pg (ref 26.0–34.0)
MCHC: 31.9 g/dL (ref 30.0–36.0)
MCV: 85.9 fL (ref 78.0–100.0)
Monocytes Absolute: 0.4 10*3/uL (ref 0.1–1.0)
Monocytes Relative: 7 %
NEUTROS PCT: 64 %
Neutro Abs: 3.3 10*3/uL (ref 1.7–7.7)
PLATELETS: 198 10*3/uL (ref 150–400)
RBC: 3.83 MIL/uL — AB (ref 3.87–5.11)
RDW: 15.3 % (ref 11.5–15.5)
WBC: 5.1 10*3/uL (ref 4.0–10.5)

## 2016-07-14 LAB — ECHOCARDIOGRAM COMPLETE
AVLVOTPG: 5 mmHg
E decel time: 261 msec
EERAT: 6.29
FS: 28 % (ref 28–44)
Height: 68 in
IV/PV OW: 0.94
LA ID, A-P, ES: 46 mm
LA diam end sys: 46 mm
LADIAMINDEX: 2.12 cm/m2
LAVOL: 97 mL
LAVOLA4C: 84 mL
LAVOLIN: 44.7 mL/m2
LDCA: 3.8 cm2
LV TDI E'MEDIAL: 8.77
LVEEAVG: 6.29
LVEEMED: 6.29
LVELAT: 10.9 cm/s
LVOT VTI: 28.4 cm
LVOT diameter: 22 mm
LVOT peak vel: 113 cm/s
LVOTSV: 108 mL
MV Dec: 261
MVPKAVEL: 51.8 m/s
MVPKEVEL: 68.6 m/s
PW: 12.1 mm — AB (ref 0.6–1.1)
Reg peak vel: 213 cm/s
TDI e' lateral: 10.9
TR max vel: 213 cm/s
Weight: 3680 oz

## 2016-07-14 LAB — URINALYSIS, MICROSCOPIC (REFLEX)

## 2016-07-14 LAB — URINALYSIS, ROUTINE W REFLEX MICROSCOPIC
BILIRUBIN URINE: NEGATIVE
GLUCOSE, UA: NEGATIVE mg/dL
KETONES UR: NEGATIVE mg/dL
Nitrite: NEGATIVE
PH: 6.5 (ref 5.0–8.0)
PROTEIN: NEGATIVE mg/dL
Specific Gravity, Urine: 1.01 (ref 1.005–1.030)

## 2016-07-14 LAB — RAPID URINE DRUG SCREEN, HOSP PERFORMED
Amphetamines: NOT DETECTED
BARBITURATES: NOT DETECTED
BENZODIAZEPINES: NOT DETECTED
Cocaine: NOT DETECTED
Opiates: NOT DETECTED
Tetrahydrocannabinol: NOT DETECTED

## 2016-07-14 LAB — TROPONIN I
Troponin I: <0.03 ng/mL (ref <0.03)
Troponin I: <0.03 ng/mL (ref <0.03)

## 2016-07-14 LAB — COMPREHENSIVE METABOLIC PANEL
ALT: 8 U/L — AB (ref 14–54)
AST: 16 U/L (ref 15–41)
Albumin: 2.9 g/dL — ABNORMAL LOW (ref 3.5–5.0)
Alkaline Phosphatase: 58 U/L (ref 38–126)
Anion gap: 7 (ref 5–15)
BILIRUBIN TOTAL: 0.3 mg/dL (ref 0.3–1.2)
BUN: 6 mg/dL (ref 6–20)
CO2: 30 mmol/L (ref 22–32)
CREATININE: 0.68 mg/dL (ref 0.44–1.00)
Calcium: 9.1 mg/dL (ref 8.9–10.3)
Chloride: 103 mmol/L (ref 101–111)
Glucose, Bld: 98 mg/dL (ref 65–99)
Potassium: 3.8 mmol/L (ref 3.5–5.1)
Sodium: 140 mmol/L (ref 135–145)
TOTAL PROTEIN: 6.5 g/dL (ref 6.5–8.1)

## 2016-07-14 LAB — CK: CK TOTAL: 40 U/L (ref 38–234)

## 2016-07-14 LAB — T4, FREE: FREE T4: 0.56 ng/dL — AB (ref 0.61–1.12)

## 2016-07-14 MED ORDER — ENSURE ENLIVE PO LIQD
237.0000 mL | Freq: Two times a day (BID) | ORAL | Status: DC
  Administered 2016-07-15: 237 mL via ORAL

## 2016-07-14 MED ORDER — ACETAMINOPHEN 325 MG PO TABS
650.0000 mg | ORAL_TABLET | Freq: Four times a day (QID) | ORAL | Status: DC | PRN
  Administered 2016-07-14 – 2016-07-15 (×3): 650 mg via ORAL
  Filled 2016-07-14 (×3): qty 2

## 2016-07-14 MED ORDER — WARFARIN - PHARMACIST DOSING INPATIENT: Freq: Every day | Status: DC

## 2016-07-14 MED ORDER — INFLUENZA VAC SPLIT QUAD 0.5 ML IM SUSY
0.5000 mL | PREFILLED_SYRINGE | INTRAMUSCULAR | Status: AC
  Administered 2016-07-15: 0.5 mL via INTRAMUSCULAR
  Filled 2016-07-14: qty 0.5

## 2016-07-14 MED ORDER — ONDANSETRON HCL 4 MG/2ML IJ SOLN: 4.0000 mg | Freq: Four times a day (QID) | INTRAMUSCULAR | Status: DC | PRN

## 2016-07-14 MED ORDER — HYDROCODONE-ACETAMINOPHEN 5-325 MG PO TABS
1.0000 | ORAL_TABLET | Freq: Once | ORAL | Status: AC
  Administered 2016-07-15: 1 via ORAL
  Filled 2016-07-14: qty 1

## 2016-07-14 MED ORDER — COUMADIN BOOK
Freq: Once | Status: DC
  Filled 2016-07-14 (×2): qty 1

## 2016-07-14 MED ORDER — ACETAMINOPHEN 650 MG RE SUPP: 650.0000 mg | Freq: Four times a day (QID) | RECTAL | Status: DC | PRN

## 2016-07-14 MED ORDER — LEVOTHYROXINE SODIUM 100 MCG PO TABS
125.0000 ug | ORAL_TABLET | Freq: Every day | ORAL | Status: DC
  Administered 2016-07-15: 125 ug via ORAL
  Filled 2016-07-14: qty 1

## 2016-07-14 MED ORDER — ONDANSETRON HCL 4 MG PO TABS: 4.0000 mg | ORAL_TABLET | Freq: Four times a day (QID) | ORAL | Status: DC | PRN

## 2016-07-14 MED ORDER — ENOXAPARIN SODIUM 120 MG/0.8ML ~~LOC~~ SOLN
105.0000 mg | Freq: Two times a day (BID) | SUBCUTANEOUS | Status: DC
  Administered 2016-07-14 – 2016-07-15 (×3): 105 mg via SUBCUTANEOUS
  Filled 2016-07-14 (×4): qty 0.8

## 2016-07-14 MED ORDER — TRAMADOL HCL 50 MG PO TABS
50.0000 mg | ORAL_TABLET | Freq: Four times a day (QID) | ORAL | Status: DC | PRN
  Administered 2016-07-14 – 2016-07-15 (×3): 50 mg via ORAL
  Filled 2016-07-14 (×3): qty 1

## 2016-07-14 MED ORDER — WARFARIN VIDEO: Freq: Once | Status: DC

## 2016-07-14 MED ORDER — WARFARIN SODIUM 10 MG PO TABS
10.0000 mg | ORAL_TABLET | Freq: Once | ORAL | Status: AC
Start: 2016-07-14 — End: 2016-07-14
  Administered 2016-07-14: 10 mg via ORAL
  Filled 2016-07-14 (×2): qty 1

## 2016-07-14 MED ORDER — SODIUM CHLORIDE 0.9 % IV SOLN
INTRAVENOUS | Status: DC
  Administered 2016-07-14: 04:00:00 via INTRAVENOUS

## 2016-07-14 NOTE — ED Notes (Signed)
Pt was assisted to the restroom because of dizziness and telling nurse she was seeing stars. She said she was really tired and felt sleepy. Really shaky upon rising. Put pt. In wheel chair and took to restroom with assistance

## 2016-07-14 NOTE — H&P (Signed)
History and Physical    [name] POL:[id number] DOB: [birth year] DOA: 07/13/2016  PCP: Annye Asa, MD  Patient coming from: Home.  Chief Complaint: Loss of consciousness.  HPI: [name] is a 60 y.o. female with history of PE and hypothyroidism who has not been taking her medications for last few months presents to the ER because of fall and loss of consciousness. Patient states over the last 3 days patient has been feeling increasingly weak and had 3 falls 1 each day. The initial 2 falls were after patient tripped. Last evening patient states she was sitting on the commode and saw flashing lights following which patient remembers being on the floor. Patient did not have any palpitations shortness of breath chest pain nausea vomiting headache or incontinence of urine or bowel or tongue bite. Patient's daughter helped her to get out of the bathroom. In the ER patient is found to be very weak and unsteady. In the ER patient had CT head and angiogram of the chest which were negative for anything acute. TSH was 18 and patient states she has not been taking her Synthroid or Coumadin since she had not followed up with the PCP for last few months. Patient states she has been taking care of her grandkids after her daughter met with accident and was unable to go to her primary care.   ED Course: CT chest was negative for PE. CT head is unremarkable. TSH was elevated. EKG shows sinus bradycardia.  Review of Systems: As per HPI, rest all negative.   Past Medical History:  Diagnosis Date  . Anemia, unspecified   . Back pain   . Benign neoplasm of colon 12/2007   Hyperplastic colon polyps  . CAD (coronary artery disease)    minimal catheterization, October 2008  . Cellulitis   . Chest pain    with stress  . Chronic low back pain   . Constipation    and diarrhea chronic..Dr. Alben Spittle  . Diverticulosis of colon (without mention of hemorrhage)   . 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 Hypopotassemia   . Hypotension, unspecified   . Leg pain   . 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  . Methadone adverse reaction    for chronic leg and back pain  . Other pulmonary embolism and infarction    Significant 2008 and coumadin therapy RV dysfunction...echo...2008..EF 50%..right ventricle markedly dilated w marked right ventricular dysfunc and moder tricuspid regurg/echo..March 2010, Ef 50%, mild dilation of right ventricule w mild decrease right ventric function   . Peripheral vascular disease ([place])   . Right bundle branch block    intermittent  . Thyroid disease   . Tobacco use disorder   . Venous insufficiency (chronic) (peripheral)    Patient's legs were wrapped 2013    Past Surgical History:  Procedure Laterality Date  . CHOLECYSTECTOMY    . GASTRIC BYPASS    . JOINT REPLACEMENT     right total hip replacement  . ROTATOR CUFF REPAIR     right shoulder  . TONSILLECTOMY       reports that she quit smoking about 5 years ago. Her smoking use included Cigarettes. She has never used smokeless tobacco. She reports that she does not drink alcohol or use drugs.  Allergies  Allergen Reactions  . Amitiza [Lubiprostone] Diarrhea and Nausea And Vomiting  . Morphine     REACTION: nausea, rash,  itching, vomiting  . Nsaids     Bowel irregularity  . Venlafaxine Other (See Comments)    Reaction unknown    Family History  Problem Relation Age of Onset  . Heart disease Mother   . Heart disease Father   . Crohn's disease Sister     Prior to Admission medications   Medication Sig Start Date End Date Taking? Authorizing Provider  cephALEXin (KEFLEX) 500 MG capsule Take 1 capsule (500 mg total) by mouth 4 (four) times daily. Patient not taking: Reported on 07/13/2016 10/10/15   Lajean Saver, MD  HYDROcodone-acetaminophen (NORCO/VICODIN) 5-325 MG tablet Take 1-2 tablets by mouth every 6 (six) hours as  needed for moderate pain. Patient not taking: Reported on 07/13/2016 10/10/15   Lajean Saver, MD  levothyroxine (SYNTHROID, LEVOTHROID) 125 MCG tablet TAKE 1 TABLET (125 MCG TOTAL) BY MOUTH DAILY BEFORE BREAKFAST. **PT NEEDS A FOLLOW WITH DR Cruzita Lederer** Patient not taking: Reported on 07/13/2016 08/11/15   Philemon Kingdom, MD  methocarbamol (ROBAXIN) 500 MG tablet Take 1 tablet (500 mg total) by mouth every 6 (six) hours as needed for muscle spasms. Patient not taking: Reported on 07/13/2016 10/13/15   Shary Decamp, PA-C  predniSONE (DELTASONE) 20 MG tablet 3 po once a day for 2 days, then 2 po once a day for 2 days, then 1 po once a day for 2 days Patient not taking: Reported on 07/13/2016 10/10/15   Lajean Saver, MD  warfarin (COUMADIN) 5 MG tablet Take 1-2 tablets (5-10 mg total) by mouth daily. As directed by [facility] Patient not taking: Reported on 07/13/2016 10/19/15   Carlena Bjornstad, MD    Physical Exam: Vitals:   07/14/16 0130 07/14/16 0200 07/14/16 0259 07/14/16 0330  BP: 122/66 104/68 111/67 108/64  Pulse: 64 63 60 (!) 56  Resp: _0 Temp:      TempSrc:      SpO2: 96% 95% 96% 92%      Constitutional: Obese not in distress. Vitals:   07/14/16 0130 07/14/16 0200 07/14/16 0259 07/14/16 0330  BP: 122/66 104/68 111/67 108/64  Pulse: 64 63 60 (!) 56  Resp: _1 Temp:      TempSrc:      SpO2: 96% 95% 96% 92%   Eyes: Anicteric. No pallor. ENMT: No discharge from the ears eyes nose and mouth. Neck: No mass felt. No neck rigidity. Respiratory: No rhonchi or crepitations. Cardiovascular: S1 and S2 heard. No murmurs appreciated. Abdomen: Soft nontender bowel sounds present. No guarding or rigidity. Musculoskeletal: No edema. No joint effusion. Skin: Appears normal. No skin rash. Neurologic: Alert awake oriented to time place and person. Moves all extremities 5 x 5. No facial asymmetry, tongue is midline. Psychiatric: Appears normal. Normal affect.   Labs on  Admission: I have personally reviewed following labs and imaging studies  CBC:  Recent Labs Lab 07/13/16 1724  WBC 4.2  HGB 10.9*  HCT 34.7*  MCV 86.3  PLT 572   Basic Metabolic Panel:  Recent Labs Lab 07/13/16 1724 07/13/16 2124  NA 139  --   K 3.6  --   CL 102  --   CO2 30  --   GLUCOSE 96  --   BUN 6  --   CREATININE 0.63  --   CALCIUM 9.4  --   MG  --  1.7   GFR: CrCl cannot be calculated (Unknown ideal weight.). Liver Function Tests: No results for input(s): AST, ALT,  ALKPHOS, BILITOT, PROT, ALBUMIN in the last 168 hours. No results for input(s): LIPASE, AMYLASE in the last 168 hours. No results for input(s): AMMONIA in the last 168 hours. Coagulation Profile:  Recent Labs Lab 07/13/16 1724  INR 1.09   Cardiac Enzymes: No results for input(s): CKTOTAL, CKMB, CKMBINDEX, TROPONINI in the last 168 hours. BNP (last 3 results) No results for input(s): PROBNP in the last 8760 hours. HbA1C: No results for input(s): HGBA1C in the last 72 hours. CBG: No results for input(s): GLUCAP in the last 168 hours. Lipid Profile: No results for input(s): CHOL, HDL, LDLCALC, TRIG, CHOLHDL, LDLDIRECT in the last 72 hours. Thyroid Function Tests:  Recent Labs  07/13/16 2124  TSH 18.172*  FREET4 0.56*   Anemia Panel: No results for input(s): VITAMINB12, FOLATE, FERRITIN, TIBC, IRON, RETICCTPCT in the last 72 hours. Urine analysis:    Component Value Date/Time   COLORURINE YELLOW 07/13/2016 2338   APPEARANCEUR CLEAR 07/13/2016 2338   LABSPEC 1.010 07/13/2016 2338   PHURINE 6.5 07/13/2016 2338   GLUCOSEU NEGATIVE 07/13/2016 2338   HGBUR SMALL (A) 07/13/2016 2338   BILIRUBINUR NEGATIVE 07/13/2016 2338   BILIRUBINUR neg 01/31/2013 1508   KETONESUR NEGATIVE 07/13/2016 2338   PROTEINUR NEGATIVE 07/13/2016 2338   UROBILINOGEN 0.2 01/31/2013 1508   UROBILINOGEN 1.0 07/07/2012 2152   NITRITE NEGATIVE 07/13/2016 2338   LEUKOCYTESUR MODERATE (A) 07/13/2016 2338    Sepsis Labs: _0 (procalcitonin:4,lacticidven:4) )No results found for this or any previous visit (from the past 240 hour(s)).   Radiological Exams on Admission: Dg Chest 2 View  Result Date: 07/13/2016 CLINICAL DATA:  Weakness and chills. Falling at home. Question pneumonia. EXAM: CHEST  2 VIEW COMPARISON:  Two-view chest x-ray 10/10/2015. FINDINGS: Heart is mildly enlarged. There is no edema or effusion to suggest failure. Emphysematous changes are again noted. No focal nodule, mass, or airspace disease is present. Mild degenerative changes are noted in the thoracic spine. IMPRESSION: 1. Emphysema. 2. No acute cardiopulmonary disease. Electronically Signed   By: San Morelle M.D.   On: 07/13/2016 21:48   Ct Head Wo Contrast  Result Date: 07/13/2016 CLINICAL DATA:  Initial evaluation for acute left arm numbness and weakness. EXAM: CT HEAD WITHOUT CONTRAST TECHNIQUE: Contiguous axial images were obtained from the base of the skull through the vertex without intravenous contrast. COMPARISON:  None available. FINDINGS: Brain: Age appropriate cerebral atrophy. Minimal chronic microvascular ischemic disease. Focal 9 mm hypodensity within the inferior right lentiform nucleus favored to reflect a dilated perivascular space, although a remote lacunar infarct could conceivably be considered as well. This is not acute or subacute in appearance. No acute intracranial hemorrhage. No evidence for acute large vessel territory infarct. No mass lesion, midline shift or mass effect. No hydrocephalus. No extra-axial fluid collection. Vascular: No hyperdense vessel. Vascular calcifications noted within the carotid siphons. Skull: Scalp soft tissues demonstrate no acute abnormality. Calvarium intact. Sinuses/Orbits: Globes and orbital soft tissues within normal limits. Left frontoethmoidal sinus disease noted. Mild mucosal thickening within the maxillary sinuses. No mastoid effusion. IMPRESSION: 1. No  acute intracranial process identified. 2. Left fronto ethmoidal sinus disease. Electronically Signed   By: Jeannine Boga M.D.   On: 07/13/2016 22:18   Ct Angio Chest Pe W And/or Wo Contrast  Result Date: 07/14/2016 CLINICAL DATA:  Syncope and elevated D-dimer. History pulmonary embolus. EXAM: CT ANGIOGRAPHY CHEST WITH CONTRAST TECHNIQUE: Multidetector CT imaging of the chest was performed using the standard protocol during bolus administration of intravenous contrast. Multiplanar CT  image reconstructions and MIPs were obtained to evaluate the vascular anatomy. CONTRAST:  100 cc Isovue 370 IV COMPARISON:  Radiographs earlier this day. Most recent CT 01/26/2008 FINDINGS: Cardiovascular: There are no filling defects within the pulmonary arteries to suggest pulmonary embolus. No evidence of aortic dissection. A few coronary artery calcifications are present. Mild cardiomegaly. Mediastinum/Nodes: Small paratracheal and prevascular lymph nodes are unchanged from prior exam, no significant progression. No hilar adenopathy. No pericardial effusion. Lungs/Pleura: Diffuse faint ground-glass opacity throughout the lungs is unchanged, there is bronchial thickening. No confluent airspace disease. No pulmonary mass or suspicious nodule. No pleural fluid. Upper Abdomen: Post gastric bypass.  No acute abnormality. Musculoskeletal: Multilevel degenerative change in the spine. Chronic mild loss of height of T10 vertebra. There are no acute or suspicious osseous abnormalities. Review of the MIP images confirms the above findings. IMPRESSION: No pulmonary embolus or acute intrathoracic process. Minimal coronary artery calcifications. Chronic mild mediastinal adenopathy and chronic lung disease, stable dating back to 2009. Electronically Signed   By: Jeb Levering M.D.   On: 07/14/2016 00:02   Dg Hips Bilat With Pelvis 3-4 Views  Result Date: 07/14/2016 CLINICAL DATA:  Bilateral hip pain. Has fallen 3 times in the past  3 days. EXAM: DG HIP (WITH OR WITHOUT PELVIS) 3-4V BILAT COMPARISON:  None. FINDINGS: Negative for acute fracture or dislocation. There are intact appearances of the right hip arthroplasty. Pubic symphysis and sacroiliac joints appear intact. IMPRESSION: Negative for acute fracture or dislocation. Electronically Signed   By: Andreas Newport M.D.   On: 07/14/2016 02:46    EKG: Independently reviewed. Sinus bradycardia with QRS of 110 ms QTC of 460 ms.  Assessment/Plan Principal Problem:   Syncope Active Problems:   Hypothyroidism   History of pulmonary embolism   COPD with emphysema ([place])   Falls frequently    1. Syncope and falls - cause not clear. Patient is mildly bradycardic which could be from the hypothyroidism. Closely monitor in telemetry. Patient was not orthostatic in the ER. Will check 2-D echo. Physical therapy consult and restart her Synthroid. 2. History of pulmonary embolism and DVT - patient was supposed to be on lifelong anticoagulation as per previous notes. CT angiogram has been negative for PE. Will restart Coumadin with Lovenox bridging. 3. Hypothyroidism - as per the records patient used to be on Synthroid 125 g. I have restarted patient on 125 g will need recheck TSH in 6 weeks. 4. COPD - presently not wheezing. 5. Left hip pain - x-ray of the left hip and pelvis are pending.   DVT prophylaxis: Lovenox and Coumadin. Code Status: Full code.  Family Communication: Discussed with patient.  Disposition Plan: Home.  Consults called: Physical therapy.  Admission status: Observation.    Rise Patience MD [facility] Pager [phone].  If 7PM-7AM, please contact night-coverage [email or web address] Password [email or web address]  07/14/2016, 3:53 AM

## 2016-07-14 NOTE — Progress Notes (Signed)
  Echocardiogram 2D Echocardiogram has been performed.  Jennette Dubin 07/14/2016, 4:14 PM

## 2016-07-14 NOTE — ED Notes (Signed)
Pt. Back from xray

## 2016-07-14 NOTE — Progress Notes (Signed)
PROGRESS NOTE    [name]  [id number] DOB: 1956-06-22 DOA: 07/13/2016 PCP: Annye Asa, MD   Brief Narrative:  This is a 60 year old female with history of pulmonary embolism and hypothyroidism who presented to hospital with a syncope episode. For last 3 days patient has been experiencing generalized weakness with multiple falls. She developed a syncope episode while on the commode, she was brought into the hospital for further evaluation. She was found  to be bradycardic between 60 and 50 bpm, her lungs were clear to auscultation, neurologically nonfocal. Her TSH was significantly elevated. Admitted for further workup.   Assessment & Plan:   Principal Problem:   Syncope Active Problems:   Hypothyroidism   History of pulmonary embolism   COPD with emphysema ([place])   Falls frequently  1. Syncope. Suspected neurocardiogenic, will continue telemetry monitoring and physical therapy evaluation. Noted negative orthostatic vitals. EKG personally reviewed noted sinus rhythm.   2. Pulmonary embolism. Continue anticoagulation with warfarin, INR at 1.09, will continue therapeutic doses of enoxaparin 150 mg bid. CT with no worsening PE.   3. Hypothyroidism. Elevated TSH, patient not taking levothyroxine for last 2 mo, will continue levothyroxine, no signs of edema or altered mentation.   4. Bradycardia. Blood pressure stable, will continue monitoring, continue levothyroxine po.  5. Chronic pain syndrome. Patient with start patient on NSAID for now, avoid opiates.     DVT prophylaxis: warfarin  Code Status: full  Family Communication: No family at the bedside  Disposition Plan: home   Consultants:     Procedures:   Antimicrobials:   Subjective: Patient feeling better, no orthostatic symptoms, no nausea or vomiting, positive back pain. Has been of pain medications for last 2 mo (reported methadone)  Objective: Vitals:   07/14/16 1130 07/14/16 1145 07/14/16 1229  07/14/16 1444  BP: 107/59  105/65 100/84  Pulse: (!) 55 (!) 55 (!) 55 (!) 59  Resp: 20 15 18    Temp:   97.4 F (36.3 C)   TempSrc:   Oral   SpO2: 92% 93% 100%   Weight:      Height:        Intake/Output Summary (Last 24 hours) at 07/14/16 1619 Last data filed at 07/14/16 0532  Gross per 24 hour  Intake              500 ml  Output              400 ml  Net              100 ml   Filed Weights   07/14/16 0500  Weight: 104.3 kg (230 lb)    Examination:  General exam: not in pain or dyspnea E ENT: no pallor or icterus Respiratory system: Clear to auscultation. Respiratory effort normal. No wheezing, rales or rhonchi.  Cardiovascular system: S1 & S2 heard, RRR. No JVD, murmurs, rubs, gallops or clicks. No pedal edema. Gastrointestinal system: Abdomen is nondistended, soft and nontender. No organomegaly or masses felt. Normal bowel sounds heard. Central nervous system: Alert and oriented. No focal neurological deficits. Extremities: Symmetric 5 x 5 power. Skin: No rashes, lesions or ulcers.     Data Reviewed: I have personally reviewed following labs and imaging studies  CBC:  Recent Labs Lab 07/13/16 1724 07/14/16 0444  WBC 4.2 5.1  NEUTROABS  --  3.3  HGB 10.9* 10.5*  HCT 34.7* 32.9*  MCV 86.3 85.9  PLT 229 [id number]   Basic Metabolic Panel:  Recent Labs  Lab 07/13/16 1724 07/13/16 2124 07/14/16 0444  NA 139  --  140  K 3.6  --  3.8  CL 102  --  103  CO2 30  --  30  GLUCOSE 96  --  98  BUN 6  --  6  CREATININE 0.63  --  0.68  CALCIUM 9.4  --  9.1  MG  --  1.7  --    GFR: Estimated Creatinine Clearance: 92.2 mL/min (by C-G formula based on SCr of 0.68 mg/dL). Liver Function Tests:  Recent Labs Lab 07/14/16 0444  AST 16  ALT 8*  ALKPHOS 58  BILITOT 0.3  PROT 6.5  ALBUMIN 2.9*   No results for input(s): LIPASE, AMYLASE in the last 168 hours. No results for input(s): AMMONIA in the last 168 hours. Coagulation Profile:  Recent Labs Lab  07/13/16 1724  INR 1.09   Cardiac Enzymes:  Recent Labs Lab 07/14/16 0444 07/14/16 0950 07/14/16 1515  CKTOTAL 40  --   --   TROPONINI <0.03 <0.03 <0.03   BNP (last 3 results) No results for input(s): PROBNP in the last 8760 hours. HbA1C: No results for input(s): HGBA1C in the last 72 hours. CBG: No results for input(s): GLUCAP in the last 168 hours. Lipid Profile: No results for input(s): CHOL, HDL, LDLCALC, TRIG, CHOLHDL, LDLDIRECT in the last 72 hours. Thyroid Function Tests:  Recent Labs  07/13/16 2124  TSH 18.172*  FREET4 0.56*   Anemia Panel: No results for input(s): VITAMINB12, FOLATE, FERRITIN, TIBC, IRON, RETICCTPCT in the last 72 hours. Sepsis Labs: No results for input(s): PROCALCITON, LATICACIDVEN in the last 168 hours.  No results found for this or any previous visit (from the past 240 hour(s)).       Radiology Studies: Dg Chest 2 View  Result Date: 07/13/2016 CLINICAL DATA:  Weakness and chills. Falling at home. Question pneumonia. EXAM: CHEST  2 VIEW COMPARISON:  Two-view chest x-ray 10/10/2015. FINDINGS: Heart is mildly enlarged. There is no edema or effusion to suggest failure. Emphysematous changes are again noted. No focal nodule, mass, or airspace disease is present. Mild degenerative changes are noted in the thoracic spine. IMPRESSION: 1. Emphysema. 2. No acute cardiopulmonary disease. Electronically Signed   By: San Morelle M.D.   On: 07/13/2016 21:48   Ct Head Wo Contrast  Result Date: 07/13/2016 CLINICAL DATA:  Initial evaluation for acute left arm numbness and weakness. EXAM: CT HEAD WITHOUT CONTRAST TECHNIQUE: Contiguous axial images were obtained from the base of the skull through the vertex without intravenous contrast. COMPARISON:  None available. FINDINGS: Brain: Age appropriate cerebral atrophy. Minimal chronic microvascular ischemic disease. Focal 9 mm hypodensity within the inferior right lentiform nucleus favored to reflect a  dilated perivascular space, although a remote lacunar infarct could conceivably be considered as well. This is not acute or subacute in appearance. No acute intracranial hemorrhage. No evidence for acute large vessel territory infarct. No mass lesion, midline shift or mass effect. No hydrocephalus. No extra-axial fluid collection. Vascular: No hyperdense vessel. Vascular calcifications noted within the carotid siphons. Skull: Scalp soft tissues demonstrate no acute abnormality. Calvarium intact. Sinuses/Orbits: Globes and orbital soft tissues within normal limits. Left frontoethmoidal sinus disease noted. Mild mucosal thickening within the maxillary sinuses. No mastoid effusion. IMPRESSION: 1. No acute intracranial process identified. 2. Left fronto ethmoidal sinus disease. Electronically Signed   By: Jeannine Boga M.D.   On: 07/13/2016 22:18   Ct Angio Chest Pe W And/or Wo Contrast  Result  Date: 07/14/2016 CLINICAL DATA:  Syncope and elevated D-dimer. History pulmonary embolus. EXAM: CT ANGIOGRAPHY CHEST WITH CONTRAST TECHNIQUE: Multidetector CT imaging of the chest was performed using the standard protocol during bolus administration of intravenous contrast. Multiplanar CT image reconstructions and MIPs were obtained to evaluate the vascular anatomy. CONTRAST:  100 cc Isovue 370 IV COMPARISON:  Radiographs earlier this day. Most recent CT 01/26/2008 FINDINGS: Cardiovascular: There are no filling defects within the pulmonary arteries to suggest pulmonary embolus. No evidence of aortic dissection. A few coronary artery calcifications are present. Mild cardiomegaly. Mediastinum/Nodes: Small paratracheal and prevascular lymph nodes are unchanged from prior exam, no significant progression. No hilar adenopathy. No pericardial effusion. Lungs/Pleura: Diffuse faint ground-glass opacity throughout the lungs is unchanged, there is bronchial thickening. No confluent airspace disease. No pulmonary mass or  suspicious nodule. No pleural fluid. Upper Abdomen: Post gastric bypass.  No acute abnormality. Musculoskeletal: Multilevel degenerative change in the spine. Chronic mild loss of height of T10 vertebra. There are no acute or suspicious osseous abnormalities. Review of the MIP images confirms the above findings. IMPRESSION: No pulmonary embolus or acute intrathoracic process. Minimal coronary artery calcifications. Chronic mild mediastinal adenopathy and chronic lung disease, stable dating back to 2009. Electronically Signed   By: Jeb Levering M.D.   On: 07/14/2016 00:02   Dg Hips Bilat With Pelvis 3-4 Views  Result Date: 07/14/2016 CLINICAL DATA:  Bilateral hip pain. Has fallen 3 times in the past 3 days. EXAM: DG HIP (WITH OR WITHOUT PELVIS) 3-4V BILAT COMPARISON:  None. FINDINGS: Negative for acute fracture or dislocation. There are intact appearances of the right hip arthroplasty. Pubic symphysis and sacroiliac joints appear intact. IMPRESSION: Negative for acute fracture or dislocation. Electronically Signed   By: Andreas Newport M.D.   On: 07/14/2016 02:46        Scheduled Meds: . coumadin book   Does not apply Once  . enoxaparin (LOVENOX) injection  105 mg Subcutaneous Q12H  . levothyroxine  125 mcg Oral QAC breakfast  . warfarin  10 mg Oral ONCE-1800  . warfarin   Does not apply Once  . Warfarin - Pharmacist Dosing Inpatient   Does not apply q1800   Continuous Infusions: . sodium chloride 75 mL/hr at 07/14/16 0403     LOS: 0 days     Tawni Millers, MD [facility] Pager [phone]  If 7PM-7AM, please contact night-coverage [email or web address] Password [email or web address] 07/14/2016, 4:19 PM

## 2016-07-14 NOTE — Progress Notes (Signed)
ANTICOAGULATION CONSULT NOTE - Initial Consult  Pharmacy Consult for Lovenox and Coumadin Indication: h/o VTE  Allergies  Allergen Reactions  . Amitiza [Lubiprostone] Diarrhea and Nausea And Vomiting  . Morphine     REACTION: nausea, rash, itching, vomiting  . Nsaids     Bowel irregularity  . Venlafaxine Other (See Comments)    Reaction unknown    Patient Measurements: Height: 5\' 8"  (172.7 cm) Weight: 230 lb (104.3 kg) IBW/kg (Calculated) : 63.9  Vital Signs: BP: 117/59 (12/07 0500) Pulse Rate: 52 (12/07 0500)  Labs:  Recent Labs  07/13/16 1724 07/14/16 0444  HGB 10.9* 10.5*  HCT 34.7* 32.9*  PLT 229 198  LABPROT 14.1  --   INR 1.09  --   CREATININE 0.63  --     Estimated Creatinine Clearance: 92.2 mL/min (by C-G formula based on SCr of 0.63 mg/dL).   Medical History: Past Medical History:  Diagnosis Date  . Anemia, unspecified   . Back pain   . Benign neoplasm of colon 12/2007   Hyperplastic colon polyps  . CAD (coronary artery disease)    minimal catheterization, October 2008  . Cellulitis   . Chest pain    with stress  . Chronic low back pain   . Constipation    and diarrhea chronic..Dr. Alben Spittle  . Diverticulosis of colon (without mention of hemorrhage)   . 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 Hypopotassemia   . Hypotension, unspecified   . Leg pain   . 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  . Methadone adverse reaction    for chronic leg and back pain  . Other pulmonary embolism and infarction    Significant 2008 and coumadin therapy RV dysfunction...echo...2008..EF 50%..right ventricle markedly dilated w marked right ventricular dysfunc and moder tricuspid regurg/echo..March 2010, Ef 50%, mild dilation of right ventricule w mild decrease right ventric function   . Peripheral vascular disease ([place])   . Right bundle branch block    intermittent  . Thyroid disease   .  Tobacco use disorder   . Venous insufficiency (chronic) (peripheral)    Patient's legs were wrapped 2013     Assessment: 60yo female c/o generalized weakness and multiple falls at home; pt supposed to be on Coumadin for h/o PE/infarct but has not been on meds 2/2 missing medical appointments; D-dimer elevated but CT chest negative for acute PE; to resume Coumadin and start LMWH bridge.  Goal of Therapy:  Anti-Xa level 0.6-1 units/ml 4hrs after LMWH dose given INR 2-2.5 (per last anticoag clinic visit notes) Monitor platelets by anticoagulation protocol: Yes   Plan:  Will start Lovenox 105mg  SQ Q12H and monitor CBC.  Will give Coumadin 10mg  PO x1 today and monitor INR for dose adjustments; will begin re-education of anticoag.  Wynona Neat, PharmD, BCPS  07/14/2016,5:31 AM

## 2016-07-14 NOTE — Evaluation (Signed)
Physical Therapy Evaluation Patient Details Name: [name] MRN: [id number] DOB: 07-31-1956 Today's Date: 07/14/2016   History of Present Illness  60 year old female with 3 recent falls secondary to dizziness, admitted 07/13/16. PMH includes CAD, hypothyroidism, previous PEs, chronic venous insufficiency, anemia, HTN, PVD, R intermittent bundle branch block, hyerlipidemia, obesity.   Clinical Impression  Patient presents with global decreased strength, impaired balance, and decreased activity tolerance and would benefit from physical therapy to address these deficits. Patient was quite emotional during the session, talking often about the grandchildren she cares for. Patient assessed for orthostasis, not present (refer below). Her standing balance is unsteady, and dynamically the patient relies increasingly on RW . Patient realizes that her mobility status is impaired, and agrees that SNF would be appropriate if there is a facility close by. This recommendation is contingent on her medical status necessitating further care. Will continue to follow.   Supine: BP 100/84, HR 59 Seated: BP 112/74, HR 64 Standing 0 minutes: BP 106/67, HR 65  Standing 3 minutes: BP 111/73, HR 72      Follow Up Recommendations SNF    Equipment Recommendations  Rolling walker with 5" wheels;3in1 (PT) (Patient is observation status, so if no follow up recommendation is possible, then patient needs DME for home. )    Recommendations for Other Services       Precautions / Restrictions Precautions Precautions: Fall      Mobility  Bed Mobility Overal bed mobility: Needs Assistance Bed Mobility: Supine to Sit;Sit to Supine     Supine to sit: Min guard Sit to supine: Min assist   General bed mobility comments: Min guard for supine to sit for safety, min assist sit to supine for assistance with leg movement onto bed.   Transfers Overall transfer level: Needs assistance Equipment used: Rolling walker (2  wheeled) Transfers: Sit to/from Stand Sit to Stand: Min assist         General transfer comment: patient required min assist for power up and extending trunk. Attempted to stand with patient's cane but was quite unstable. Grabbed for objects in environment upon standing.   Ambulation/Gait Ambulation/Gait assistance: Min guard Ambulation Distance (Feet): 110 Feet Assistive device: Rolling walker (2 wheeled) Gait Pattern/deviations: Step-to pattern;Trunk flexed Gait velocity: very decreased  Gait velocity interpretation: Below normal speed for age/gender General Gait Details: patient was cued to step into RW and keep trunk extended. Patient had some unsteadiness with ambulation and relied on RW for support.   Stairs            Wheelchair Mobility    Modified Rankin (Stroke Patients Only)       Balance Overall balance assessment: Needs assistance Sitting-balance support: Bilateral upper extremity supported;Feet supported Sitting balance-Leahy Scale: Fair   Postural control: Posterior lean;Right lateral lean (R lateral lean to offload LLE ) Standing balance support: Bilateral upper extremity supported Standing balance-Leahy Scale: Poor Standing balance comment: Patient relied heavily on RW for balance.                              Pertinent Vitals/Pain Pain Assessment: 0-10 Pain Score: 7  Pain Location: LLE Pain Descriptors / Indicators: Burning Pain Intervention(s): Limited activity within patient's tolerance;Repositioned;Patient requesting pain meds-RN notified    Home Living Family/patient expects to be discharged to:: Private residence Living Arrangements: Other relatives (5 grandchildren (10, 6, 3, 1, 1))   Type of Home: Apartment Home Access: Stairs to  enter Entrance Stairs-Rails: None (patient uses door frame ) Entrance Stairs-Number of Steps: 2 Home Layout: One level Home Equipment: Walker - 2 wheels;Cane - single point      Prior Function  Level of Independence: Independent with assistive device(s)         Comments: uses a cane at home, states she cares for all 5 grandchildren on her own, no car and gets assist to go to the grocery store on occasion     Hand Dominance        Extremity/Trunk Assessment   Upper Extremity Assessment: Generalized weakness           Lower Extremity Assessment: Generalized weakness      Cervical / Trunk Assessment: Kyphotic  Communication   Communication: No difficulties  Cognition Arousal/Alertness: Awake/alert Behavior During Therapy: WFL for tasks assessed/performed Overall Cognitive Status: Difficult to assess                 General Comments: patient is emotional, crying once during treatment session. Cognitive status appears impaired, but unsure of baseline due to no family being present to discuss.     General Comments      Exercises     Assessment/Plan    PT Assessment Patient needs continued PT services  PT Problem List Decreased strength;Decreased activity tolerance;Decreased balance;Decreased mobility;Decreased coordination;Decreased cognition;Decreased safety awareness;Decreased knowledge of use of DME          PT Treatment Interventions DME instruction;Gait training;Functional mobility training;Stair training;Therapeutic activities;Therapeutic exercise;Balance training;Patient/family education    PT Goals (Current goals can be found in the Care Plan section)  Acute Rehab PT Goals Patient Stated Goal: walk without AD  PT Goal Formulation: With patient Time For Goal Achievement: 07/28/16 Potential to Achieve Goals: Poor    Frequency Min 3X/week   Barriers to discharge   Patient states that daughter and her neighbor will be able to assist her somewhat after discharge. Unsure of assistance level that will be available.     Co-evaluation               End of Session Equipment Utilized During Treatment: Gait belt Activity Tolerance: No  increased pain;Patient limited by fatigue Patient left: in chair;with call bell/phone within reach;with chair alarm set (physician present )           Time:  -      Charges:         PT G Codes:        Nakari Bracknell 2016-08-05, 3:04 PM Ilamae Geng SPT [phone]

## 2016-07-14 NOTE — ED Notes (Signed)
Pt given hot pack for L hip pain.

## 2016-07-15 ENCOUNTER — Telehealth: Payer: Self-pay

## 2016-07-15 DIAGNOSIS — R296 Repeated falls: Secondary | ICD-10-CM | POA: Diagnosis not present

## 2016-07-15 DIAGNOSIS — R55 Syncope and collapse: Secondary | ICD-10-CM

## 2016-07-15 DIAGNOSIS — Z86711 Personal history of pulmonary embolism: Secondary | ICD-10-CM | POA: Diagnosis not present

## 2016-07-15 DIAGNOSIS — E038 Other specified hypothyroidism: Secondary | ICD-10-CM

## 2016-07-15 LAB — CBC
HEMATOCRIT: 34 % — AB (ref 36.0–46.0)
Hemoglobin: 10.8 g/dL — ABNORMAL LOW (ref 12.0–15.0)
MCH: 27.3 pg (ref 26.0–34.0)
MCHC: 31.8 g/dL (ref 30.0–36.0)
MCV: 85.9 fL (ref 78.0–100.0)
Platelets: 181 10*3/uL (ref 150–400)
RBC: 3.96 MIL/uL (ref 3.87–5.11)
RDW: 15 % (ref 11.5–15.5)
WBC: 4.3 10*3/uL (ref 4.0–10.5)

## 2016-07-15 LAB — PROTIME-INR
INR: 1.18
Prothrombin Time: 15.1 seconds (ref 11.4–15.2)

## 2016-07-15 MED ORDER — ENOXAPARIN (LOVENOX) PATIENT EDUCATION KIT
PACK | Freq: Once | Status: AC
  Administered 2016-07-15: 12:00:00
  Filled 2016-07-15: qty 1

## 2016-07-15 MED ORDER — CALCIUM CARBONATE ANTACID 500 MG PO CHEW
1.0000 | CHEWABLE_TABLET | Freq: Three times a day (TID) | ORAL | Status: DC | PRN
  Administered 2016-07-15: 200 mg via ORAL
  Filled 2016-07-15: qty 1

## 2016-07-15 MED ORDER — LEVOTHYROXINE SODIUM 125 MCG PO TABS: 125.0000 ug | ORAL_TABLET | Freq: Every day | ORAL | 0 refills | Status: DC

## 2016-07-15 MED ORDER — WARFARIN SODIUM 10 MG PO TABS: 5.0000 mg | ORAL_TABLET | Freq: Once | ORAL | 0 refills | Status: DC

## 2016-07-15 MED ORDER — WARFARIN SODIUM 10 MG PO TABS: 10.0000 mg | ORAL_TABLET | Freq: Once | ORAL | Status: DC

## 2016-07-15 MED ORDER — COUMADIN BOOK: 1.0000 | Freq: Once | 0 refills | Status: AC

## 2016-07-15 MED ORDER — ENOXAPARIN (LOVENOX) PATIENT EDUCATION KIT: 1.0000 | PACK | Freq: Once | 0 refills | Status: AC

## 2016-07-15 MED ORDER — ENOXAPARIN SODIUM 150 MG/ML ~~LOC~~ SOLN: 105.0000 mg | Freq: Two times a day (BID) | SUBCUTANEOUS | 0 refills | Status: DC

## 2016-07-15 MED ORDER — ACETAMINOPHEN 325 MG PO TABS: 650.0000 mg | ORAL_TABLET | Freq: Four times a day (QID) | ORAL | 0 refills | Status: DC | PRN

## 2016-07-15 NOTE — Discharge Summary (Addendum)
Physician Discharge Summary  [name] QJJ:[id number] DOB: [birth year] DOA: 07/13/2016  PCP: Annye Asa, MD  Admit date: 07/13/2016 Discharge date: 07/15/2016  Admitted From: Home  Disposition:  Home   Recommendations for Outpatient Follow-up:  1. Follow up with PCP in 1- weeks 2. Please obtain INR in 3 days 3. Patient placed on enoxaparin for anticoagulation bridge. 4. New prescriptions given.   Home Health: Yes Equipment/Devices: wheelchair/ rolling walker.   Discharge Condition: Stable CODE STATUS: Full  Diet recommendation: Heart Healthy   Brief/Interim Summary: This is a 60 yo female who presented to the hospital with syncope episode. Over last 3 days prior to admission, she noticed worsening weakness and frequent falls. The evening before admission she was sitting in the commode, moving her bowels, she saw flashing lights and loss her consciousness for a brief period of time. By the time she reached the emergency department she was awake and alert. Blood pressure was 122/66, heart rate 56-64, respiratory 22, oxygen saturation 96%. Her lungs were clear to auscultation bilaterally, heart S1-S2 present and rhythmic, abdomen soft nontender, no symptoms no edema. Sodium 139, potassium 3.6, BUN 6, creatinine 0.63, white count 4.7, hemoglobin 10.9, hematocrit 34.7, platelets 229. INR 1.0, TSH 18.7, T4 free 0.5. D-dimer 0.82. CT chest negative for pulmonary embolism, head CT negative for acute abnormalities. Her EKG was normal sinus rhythm rate of 58 minute. Troponin was less than 0.03.   The patient was admitted to the hospital working diagnosis of syncope and ambulatory dysfunction, complicated by bradycardia and hypothyroidism.  1. Syncope. Suspected to be neurocardiogenic/vasovagal. Patient lost her consciousness while moving her bowels, she did have prodrome symptoms with a fast recovery. Telemetry monitor remained sinus rhythm. Echocardiography showed ejection fraction 50% with  no wall motion abnormalities. Orthostatic vital signs were negative.   2. Ambulatory dysfunction. Patient was seen by physical therapy, recommendations for skilled nursing facility, unfortunately patient is under observation due to her severity of illness. Will arrange for home health, home physical therapy and nursing. Patient does have a rolling walker and will add a wheelchair for assistance. I did call his daughter, at this point I am waiting for her to call me back.  3. Pulmonary embolism. No evidence for new venous thromboembolism, patient INR is supratherapeutic, enoxaparin was given at full therapeutic dose, that she will continue for next 5 days. Compliance with warfarin was reinforced. Plan to check INR in 3 days. She assures that her daughter can help her with the subcutaneous injections.   4. Hypothyroidism. The patient was resumed on levothyroxine, a prescription was given. Compliance was reinforced.   5. Bradycardia. Patient's heart rate remained between 50 and 60, with a stable blood pressure. Will plan to monitor as an outpatient heart rate response after adequate levothyroxine replacement. It is possible that she has in increase in vagal tone.   Discharge Diagnoses:  Principal Problem:   Syncope Active Problems:   Hypothyroidism   History of pulmonary embolism   COPD with emphysema ([place])   Falls frequently    Discharge Instructions  Discharge Instructions    Diet - low sodium heart healthy    Complete by:  As directed    Discharge instructions    Complete by:  As directed    Please follow with the warfarin clinic in 3 days.   Increase activity slowly    Complete by:  As directed        Medication List    STOP taking these medications  cephALEXin 500 MG capsule Commonly known as:  KEFLEX   HYDROcodone-acetaminophen 5-325 MG tablet Commonly known as:  NORCO/VICODIN   methocarbamol 500 MG tablet Commonly known as:  ROBAXIN   predniSONE 20 MG  tablet Commonly known as:  DELTASONE     TAKE these medications   acetaminophen 325 MG tablet Commonly known as:  TYLENOL Take 2 tablets (650 mg total) by mouth every 6 (six) hours as needed for moderate pain.   coumadin book Misc 1 each by Does not apply route once.   enoxaparin 150 MG/ML injection Commonly known as:  LOVENOX Inject 0.7 mLs (105 mg total) into the skin every 12 (twelve) hours.   enoxaparin Kit Commonly known as:  LOVENOX 1 kit by Does not apply route once.   levothyroxine 125 MCG tablet Commonly known as:  SYNTHROID, LEVOTHROID Take 1 tablet (125 mcg total) by mouth daily before breakfast. Start taking on:  07/16/2016 What changed:  See the new instructions.   warfarin 10 MG tablet Commonly known as:  COUMADIN Take 0.5 tablets (5 mg total) by mouth one time only at 6 PM. What changed:  medication strength  how much to take  when to take this  additional instructions            Durable Medical Equipment        Start     Ordered   07/15/16 1119  For home use only DME 3 n 1  Once     07/15/16 1118     Follow-up Information    Annye Asa, MD Follow up in 1 week(s).   Specialty:  Family Medicine Contact information: [address] [place] [id number] [phone]          Allergies  Allergen Reactions  . Amitiza [Lubiprostone] Diarrhea and Nausea And Vomiting  . Morphine     REACTION: nausea, rash, itching, vomiting  . Nsaids     Bowel irregularity  . Venlafaxine Other (See Comments)    Reaction unknown    Consultations:     Procedures/Studies: Dg Chest 2 View  Result Date: 07/13/2016 CLINICAL DATA:  Weakness and chills. Falling at home. Question pneumonia. EXAM: CHEST  2 VIEW COMPARISON:  Two-view chest x-ray 10/10/2015. FINDINGS: Heart is mildly enlarged. There is no edema or effusion to suggest failure. Emphysematous changes are again noted. No focal nodule, mass, or airspace disease is present. Mild  degenerative changes are noted in the thoracic spine. IMPRESSION: 1. Emphysema. 2. No acute cardiopulmonary disease. Electronically Signed   By: San Morelle M.D.   On: 07/13/2016 21:48   Ct Head Wo Contrast  Result Date: 07/13/2016 CLINICAL DATA:  Initial evaluation for acute left arm numbness and weakness. EXAM: CT HEAD WITHOUT CONTRAST TECHNIQUE: Contiguous axial images were obtained from the base of the skull through the vertex without intravenous contrast. COMPARISON:  None available. FINDINGS: Brain: Age appropriate cerebral atrophy. Minimal chronic microvascular ischemic disease. Focal 9 mm hypodensity within the inferior right lentiform nucleus favored to reflect a dilated perivascular space, although a remote lacunar infarct could conceivably be considered as well. This is not acute or subacute in appearance. No acute intracranial hemorrhage. No evidence for acute large vessel territory infarct. No mass lesion, midline shift or mass effect. No hydrocephalus. No extra-axial fluid collection. Vascular: No hyperdense vessel. Vascular calcifications noted within the carotid siphons. Skull: Scalp soft tissues demonstrate no acute abnormality. Calvarium intact. Sinuses/Orbits: Globes and orbital soft tissues within normal limits. Left frontoethmoidal  sinus disease noted. Mild mucosal thickening within the maxillary sinuses. No mastoid effusion. IMPRESSION: 1. No acute intracranial process identified. 2. Left fronto ethmoidal sinus disease. Electronically Signed   By: Jeannine Boga M.D.   On: 07/13/2016 22:18   Ct Angio Chest Pe W And/or Wo Contrast  Result Date: 07/14/2016 CLINICAL DATA:  Syncope and elevated D-dimer. History pulmonary embolus. EXAM: CT ANGIOGRAPHY CHEST WITH CONTRAST TECHNIQUE: Multidetector CT imaging of the chest was performed using the standard protocol during bolus administration of intravenous contrast. Multiplanar CT image reconstructions and MIPs were obtained to  evaluate the vascular anatomy. CONTRAST:  100 cc Isovue 370 IV COMPARISON:  Radiographs earlier this day. Most recent CT 01/26/2008 FINDINGS: Cardiovascular: There are no filling defects within the pulmonary arteries to suggest pulmonary embolus. No evidence of aortic dissection. A few coronary artery calcifications are present. Mild cardiomegaly. Mediastinum/Nodes: Small paratracheal and prevascular lymph nodes are unchanged from prior exam, no significant progression. No hilar adenopathy. No pericardial effusion. Lungs/Pleura: Diffuse faint ground-glass opacity throughout the lungs is unchanged, there is bronchial thickening. No confluent airspace disease. No pulmonary mass or suspicious nodule. No pleural fluid. Upper Abdomen: Post gastric bypass.  No acute abnormality. Musculoskeletal: Multilevel degenerative change in the spine. Chronic mild loss of height of T10 vertebra. There are no acute or suspicious osseous abnormalities. Review of the MIP images confirms the above findings. IMPRESSION: No pulmonary embolus or acute intrathoracic process. Minimal coronary artery calcifications. Chronic mild mediastinal adenopathy and chronic lung disease, stable dating back to 2009. Electronically Signed   By: Jeb Levering M.D.   On: 07/14/2016 00:02   Dg Hips Bilat With Pelvis 3-4 Views  Result Date: 07/14/2016 CLINICAL DATA:  Bilateral hip pain. Has fallen 3 times in the past 3 days. EXAM: DG HIP (WITH OR WITHOUT PELVIS) 3-4V BILAT COMPARISON:  None. FINDINGS: Negative for acute fracture or dislocation. There are intact appearances of the right hip arthroplasty. Pubic symphysis and sacroiliac joints appear intact. IMPRESSION: Negative for acute fracture or dislocation. Electronically Signed   By: Andreas Newport M.D.   On: 07/14/2016 02:46      Subjective: Patient feeling better, no nausea or vomiting, no chest pain of dyspnea.   Discharge Exam: Vitals:   07/14/16 2000 07/15/16 0513  BP: 109/63 (!)  107/59  Pulse: (!) 57 (!) 54  Resp: 18   Temp: 98.5 F (36.9 C) 98 F (36.7 C)   Vitals:   07/14/16 1229 07/14/16 1444 07/14/16 2000 07/15/16 0513  BP: 105/65 100/84 109/63 (!) 107/59  Pulse: (!) 55 (!) 59 (!) 57 (!) 54  Resp: 18  18   Temp: 97.4 F (36.3 C)  98.5 F (36.9 C) 98 F (36.7 C)  TempSrc: Oral  Oral Oral  SpO2: 100%  100%   Weight:      Height:        General: Pt is alert, awake, not in acute distress Cardiovascular: RRR, S1/S2 +, no rubs, no gallops Respiratory: CTA bilaterally, no wheezing, no rhonchi Abdominal: Soft, NT, ND, bowel sounds + Extremities: no edema, no cyanosis    The results of significant diagnostics from this hospitalization (including imaging, microbiology, ancillary and laboratory) are listed below for reference.     Microbiology: No results found for this or any previous visit (from the past 240 hour(s)).   Labs: BNP (last 3 results)  Recent Labs  07/13/16 2117  BNP 16.1   Basic Metabolic Panel:  Recent Labs Lab 07/13/16 1724 07/13/16 2124  07/14/16 0444  NA 139  --  140  K 3.6  --  3.8  CL 102  --  103  CO2 30  --  30  GLUCOSE 96  --  98  BUN 6  --  6  CREATININE 0.63  --  0.68  CALCIUM 9.4  --  9.1  MG  --  1.7  --    Liver Function Tests:  Recent Labs Lab 07/14/16 0444  AST 16  ALT 8*  ALKPHOS 58  BILITOT 0.3  PROT 6.5  ALBUMIN 2.9*   No results for input(s): LIPASE, AMYLASE in the last 168 hours. No results for input(s): AMMONIA in the last 168 hours. CBC:  Recent Labs Lab 07/13/16 1724 07/14/16 0444 07/15/16 0459  WBC 4.2 5.1 4.3  NEUTROABS  --  3.3  --   HGB 10.9* 10.5* 10.8*  HCT 34.7* 32.9* 34.0*  MCV 86.3 85.9 85.9  PLT 229 198 181   Cardiac Enzymes:  Recent Labs Lab 07/14/16 0444 07/14/16 0950 07/14/16 1515  CKTOTAL 40  --   --   TROPONINI <0.03 <0.03 <0.03   BNP: Invalid input(s): POCBNP CBG: No results for input(s): GLUCAP in the last 168 hours. D-Dimer  Recent Labs   07/13/16 2124  DDIMER 0.82*   Hgb A1c No results for input(s): HGBA1C in the last 72 hours. Lipid Profile No results for input(s): CHOL, HDL, LDLCALC, TRIG, CHOLHDL, LDLDIRECT in the last 72 hours. Thyroid function studies  Recent Labs  07/13/16 2124  TSH 18.172*   Anemia work up No results for input(s): VITAMINB12, FOLATE, FERRITIN, TIBC, IRON, RETICCTPCT in the last 72 hours. Urinalysis    Component Value Date/Time   COLORURINE YELLOW 07/13/2016 2338   APPEARANCEUR CLEAR 07/13/2016 2338   LABSPEC 1.010 07/13/2016 2338   PHURINE 6.5 07/13/2016 2338   GLUCOSEU NEGATIVE 07/13/2016 2338   HGBUR SMALL (A) 07/13/2016 2338   BILIRUBINUR NEGATIVE 07/13/2016 2338   BILIRUBINUR neg 01/31/2013 1508   KETONESUR NEGATIVE 07/13/2016 2338   PROTEINUR NEGATIVE 07/13/2016 2338   UROBILINOGEN 0.2 01/31/2013 1508   UROBILINOGEN 1.0 07/07/2012 2152   NITRITE NEGATIVE 07/13/2016 2338   LEUKOCYTESUR MODERATE (A) 07/13/2016 2338   Sepsis Labs Invalid input(s): PROCALCITONIN,  WBC,  LACTICIDVEN Microbiology No results found for this or any previous visit (from the past 240 hour(s)).   Time coordinating discharge: 45 minutes  SIGNED:   Tawni Millers, MD  [facility] 07/15/2016, 9:24 AM Pager   If 7PM-7AM, please contact night-coverage [email or web address] Password [email or web address]

## 2016-07-15 NOTE — Telephone Encounter (Signed)
LM requesting call back to complete TCM and schedule hospital follow up.   
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
## 2016-07-15 NOTE — Progress Notes (Signed)
ANTICOAGULATION CONSULT NOTE - Follow Up Consult  Pharmacy Consult for Coumadin and Lovenox Indication: h/o VTE  Allergies  Allergen Reactions  . Amitiza [Lubiprostone] Diarrhea and Nausea And Vomiting  . Morphine     REACTION: nausea, rash, itching, vomiting  . Nsaids     Bowel irregularity  . Venlafaxine Other (See Comments)    Reaction unknown    Patient Measurements: Height: 5\' 8"  (172.7 cm) Weight: 230 lb (104.3 kg) IBW/kg (Calculated) : 63.9   Vital Signs: Temp: 98 F (36.7 C) (12/08 0513) Temp Source: Oral (12/08 0513) BP: 107/59 (12/08 0513) Pulse Rate: 54 (12/08 0513)  Labs:  Recent Labs  07/13/16 1724 07/14/16 0444 07/14/16 0950 07/14/16 1515 07/15/16 0459  HGB 10.9* 10.5*  --   --  10.8*  HCT 34.7* 32.9*  --   --  34.0*  PLT 229 198  --   --  181  LABPROT 14.1  --   --   --  15.1  INR 1.09  --   --   --  1.18  CREATININE 0.63 0.68  --   --   --   CKTOTAL  --  40  --   --   --   TROPONINI  --  <0.03 <0.03 <0.03  --     Estimated Creatinine Clearance: 92.2 mL/min (by C-G formula based on SCr of 0.68 mg/dL).  Assessment: 60yof on coumadin pta for h/o DVT/PE, but hadn't been taking for a few months. Admitted with fall - CT head negative. CT chest negative for new PE. Coumadin restarted along with lovenox bridge. INR 1.18 today. CBC stable. No bleeding.  Goal of Therapy:  Anti-Xa level 0.6-1 units/ml 4hrs after LMWH dose given INR 2-2.5 (per last anticoag clinic visit notes) Monitor platelets by anticoagulation protocol: Yes   Plan:  1) Coumadin 10mg  x 1 2) Lovenox 105mg  sq q12 3) Daily INR, CBC q72h  Deboraha Sprang 07/15/2016,8:55 AM

## 2016-07-15 NOTE — Progress Notes (Signed)
Returned to bring pt copy of New Testament/Psalms as she watched the beautiful snow falling, sitting by the window resting. She'd mentioned that her Bible didn't get sent w/ her to hospital. She was most appreciative.   07/15/16 1400  Clinical Encounter Type  Visited With Patient  Visit Type Follow-up;Psychological support;Spiritual support;Social support  Referral From [place] text;Emotional  Stress Factors  Patient Stress Factors Family relationships;Health changes;Loss of control  Gerrit Heck, Chaplain
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
## 2016-07-15 NOTE — Progress Notes (Addendum)
Notified by bedside RN-regarding cost of Lovenox- call made to [facility]- pt has [place] for 10 syringes is $244.00- pharmacy states that pt can get half as 5 syringes would be $122.40. CM can not assist pt due to pt having part D coverage- spoke with pt at bedside and shared with her what [facility] told CM- pt due for PT/INR check on 12/11- and could re-assess if rest of Lovenox dosing needed. CSW working on taxi voucher for pt as neighbor can not get here now due to the weather.

## 2016-07-15 NOTE — Progress Notes (Signed)
When responded to consult for prayer, pt was not available. Will return later.   07/15/16 1100  Clinical Encounter Type  Visited With Patient not available  Visit Type Initial  Referral From Nurse  Spiritual Encounters  Spiritual Needs Prayer   Gerrit Heck, Chaplain

## 2016-07-15 NOTE — Care Management Obs Status (Signed)
[place] NOTIFICATION   Patient Details  Name: [name] MRN: [id number] Date of Birth: Oct 14, 1955   Medicare Observation Status Notification Given:  Yes    Dawayne Patricia, RN 07/15/2016, 11:20 AM

## 2016-07-16 DIAGNOSIS — R55 Syncope and collapse: Secondary | ICD-10-CM | POA: Diagnosis not present

## 2016-07-16 DIAGNOSIS — E039 Hypothyroidism, unspecified: Secondary | ICD-10-CM | POA: Diagnosis not present

## 2016-07-16 DIAGNOSIS — J449 Chronic obstructive pulmonary disease, unspecified: Secondary | ICD-10-CM | POA: Diagnosis not present

## 2016-07-17 DIAGNOSIS — R55 Syncope and collapse: Secondary | ICD-10-CM | POA: Diagnosis not present

## 2016-07-17 DIAGNOSIS — E039 Hypothyroidism, unspecified: Secondary | ICD-10-CM | POA: Diagnosis not present

## 2016-07-18 ENCOUNTER — Ambulatory Visit (INDEPENDENT_AMBULATORY_CARE_PROVIDER_SITE_OTHER): Payer: Medicare Other | Admitting: *Deleted

## 2016-07-18 DIAGNOSIS — I2699 Other pulmonary embolism without acute cor pulmonale: Secondary | ICD-10-CM

## 2016-07-18 DIAGNOSIS — Z86711 Personal history of pulmonary embolism: Secondary | ICD-10-CM | POA: Diagnosis not present

## 2016-07-18 LAB — POCT INR: INR: 1

## 2016-07-20 DIAGNOSIS — E039 Hypothyroidism, unspecified: Secondary | ICD-10-CM | POA: Diagnosis not present

## 2016-07-20 DIAGNOSIS — R55 Syncope and collapse: Secondary | ICD-10-CM | POA: Diagnosis not present

## 2016-07-20 NOTE — Telephone Encounter (Signed)
LM requesting call back to complete TCM and confirm appointment.  

## 2016-07-21 DIAGNOSIS — R55 Syncope and collapse: Secondary | ICD-10-CM | POA: Diagnosis not present

## 2016-07-21 DIAGNOSIS — E039 Hypothyroidism, unspecified: Secondary | ICD-10-CM | POA: Diagnosis not present

## 2016-07-22 DIAGNOSIS — R55 Syncope and collapse: Secondary | ICD-10-CM | POA: Diagnosis not present

## 2016-07-22 DIAGNOSIS — E039 Hypothyroidism, unspecified: Secondary | ICD-10-CM | POA: Diagnosis not present

## 2016-07-25 DIAGNOSIS — E039 Hypothyroidism, unspecified: Secondary | ICD-10-CM | POA: Diagnosis not present

## 2016-07-25 DIAGNOSIS — R55 Syncope and collapse: Secondary | ICD-10-CM | POA: Diagnosis not present

## 2016-07-26 DIAGNOSIS — R55 Syncope and collapse: Secondary | ICD-10-CM | POA: Diagnosis not present

## 2016-07-26 DIAGNOSIS — E039 Hypothyroidism, unspecified: Secondary | ICD-10-CM | POA: Diagnosis not present

## 2016-07-27 ENCOUNTER — Ambulatory Visit: Payer: Medicare Other | Admitting: Family Medicine

## 2016-07-28 DIAGNOSIS — R55 Syncope and collapse: Secondary | ICD-10-CM | POA: Diagnosis not present

## 2016-07-28 DIAGNOSIS — E039 Hypothyroidism, unspecified: Secondary | ICD-10-CM | POA: Diagnosis not present

## 2016-07-29 ENCOUNTER — Telehealth: Payer: Self-pay | Admitting: *Deleted

## 2016-07-29 NOTE — Telephone Encounter (Signed)
Kelly from [facility] called (per their protocol) to let Dr. Birdie Riddle know that patient canceled her appointment with them for today 07/29/16.   Message forwarded to Dr. Birdie Riddle just as Juluis Rainier.

## 2016-08-02 DIAGNOSIS — R55 Syncope and collapse: Secondary | ICD-10-CM | POA: Diagnosis not present

## 2016-08-02 DIAGNOSIS — E039 Hypothyroidism, unspecified: Secondary | ICD-10-CM | POA: Diagnosis not present

## 2016-08-03 DIAGNOSIS — R55 Syncope and collapse: Secondary | ICD-10-CM | POA: Diagnosis not present

## 2016-08-03 DIAGNOSIS — E039 Hypothyroidism, unspecified: Secondary | ICD-10-CM | POA: Diagnosis not present

## 2016-08-04 DIAGNOSIS — E039 Hypothyroidism, unspecified: Secondary | ICD-10-CM | POA: Diagnosis not present

## 2016-08-04 DIAGNOSIS — R55 Syncope and collapse: Secondary | ICD-10-CM | POA: Diagnosis not present

## 2016-08-09 DIAGNOSIS — E039 Hypothyroidism, unspecified: Secondary | ICD-10-CM | POA: Diagnosis not present

## 2016-08-09 DIAGNOSIS — R55 Syncope and collapse: Secondary | ICD-10-CM | POA: Diagnosis not present

## 2016-08-11 ENCOUNTER — Ambulatory Visit (INDEPENDENT_AMBULATORY_CARE_PROVIDER_SITE_OTHER): Payer: Medicare Other | Admitting: Family Medicine

## 2016-08-11 ENCOUNTER — Encounter: Payer: Self-pay | Admitting: Family Medicine

## 2016-08-11 ENCOUNTER — Telehealth: Payer: Self-pay | Admitting: Emergency Medicine

## 2016-08-11 VITALS — BP 122/81 | HR 67 | Temp 98.3°F | Resp 16 | Ht 68.0 in | Wt 225.2 lb

## 2016-08-11 DIAGNOSIS — E039 Hypothyroidism, unspecified: Secondary | ICD-10-CM | POA: Diagnosis not present

## 2016-08-11 DIAGNOSIS — Z5181 Encounter for therapeutic drug level monitoring: Secondary | ICD-10-CM | POA: Diagnosis not present

## 2016-08-11 DIAGNOSIS — Z7901 Long term (current) use of anticoagulants: Secondary | ICD-10-CM

## 2016-08-11 DIAGNOSIS — N8110 Cystocele, unspecified: Secondary | ICD-10-CM

## 2016-08-11 DIAGNOSIS — Z86711 Personal history of pulmonary embolism: Secondary | ICD-10-CM | POA: Diagnosis not present

## 2016-08-11 DIAGNOSIS — E038 Other specified hypothyroidism: Secondary | ICD-10-CM | POA: Diagnosis not present

## 2016-08-11 DIAGNOSIS — I872 Venous insufficiency (chronic) (peripheral): Secondary | ICD-10-CM

## 2016-08-11 DIAGNOSIS — I7025 Atherosclerosis of native arteries of other extremities with ulceration: Secondary | ICD-10-CM

## 2016-08-11 DIAGNOSIS — R55 Syncope and collapse: Secondary | ICD-10-CM

## 2016-08-11 LAB — PROTIME-INR
INR: 4.7 ratio — ABNORMAL HIGH (ref 0.8–1.0)
Prothrombin Time: 51.5 s — ABNORMAL HIGH (ref 9.6–13.1)

## 2016-08-11 LAB — TSH: TSH: 15.75 u[IU]/mL — AB (ref 0.35–4.50)

## 2016-08-11 MED ORDER — SULFAMETHOXAZOLE-TRIMETHOPRIM 800-160 MG PO TABS: 1.0000 | ORAL_TABLET | Freq: Two times a day (BID) | ORAL | 0 refills | Status: DC

## 2016-08-11 NOTE — Telephone Encounter (Signed)
Will you please contact pt to schedule an est care visit.

## 2016-08-11 NOTE — Telephone Encounter (Signed)
-----   Message from Binnie Rail, MD sent at 08/11/2016 12:02 PM EST ----- Regarding: RE: Please Help!!! Yes, of course I will see her.   Have her make an appointment.  [place] ----- Message ----- From: Midge Minium, MD Sent: 08/11/2016  11:48 AM To: Binnie Rail, MD Subject: Please Help!!!                                 Happy New Year!!!  I need your help w/ this very nice but very unfortunate pt.  She lives in [place] and is not able to afford a cab ride to [place] and city transport won't bring her out here.  She definitely needs care- she has been MIA for over 2 yrs- and while she is medically complicated she is VERY nice and willing to work with the treatment plans as she is financially able.  She is looking for an excellent doctor who 'listens' like me and I instantly thought of you.  I promise, I'm not trying to dump!!!  Please let me know if you will see her! Thanks and I hope you had a great holiday! Anda Kraft

## 2016-08-11 NOTE — Progress Notes (Signed)
Pre visit review using our clinic review tool, if applicable. No additional management support is needed unless otherwise documented below in the visit note. 

## 2016-08-11 NOTE — Patient Instructions (Signed)
Please schedule an appt w/ Dr Quay Burow at [facility] (I just reached out to her to explain what's going on) We'll notify you of your lab results and make any changes We'll call you with your GYN and wound care appts Start the Bactrim twice daily- take w/ food Make sure you are taking your thyroid medication and coumadin daily! Call with any questions or concerns Happy New Year! Hang in there!!!

## 2016-08-11 NOTE — Progress Notes (Signed)
   Subjective:    Patient ID: [name], female    DOB: 09-30-1955, 61 y.o.   MRN: [id number]  HPI Hospital f/u- pt was admitted 12/6-8 for syncope while sitting on the toilet.  It was determined this was a vagal episode.  Head CT and Chest angio were negative.  Pt was subtherapeutic on Coumadin at time of admission and was given a Lovenox bridge.  Was also found to be hypothyroid w/ TSH of >18 (previously seeing Dr Cruzita Lederer).  Imaging studies, d/c summary, and labs reviewed.  Med rec complete.    Stasis ulcers- chronic problem for pt.  L leg w/ both crusting and oozing.  Chronic surrounding redness.  No fevers.  Vaginal pressure- 'something's coming out'.  Noticed 3-4 days ago.  Pt has hx of bladder prolapse.     Review of Systems For ROS see HPI     Objective:   Physical Exam  Constitutional: She is oriented to person, place, and time. She appears well-developed and well-nourished. No distress.  obese  HENT:  Head: Normocephalic and atraumatic.  Eyes: Conjunctivae and EOM are normal. Pupils are equal, round, and reactive to light.  Neck: Normal range of motion. Neck supple. No thyromegaly present.  Cardiovascular: Normal rate, regular rhythm, normal heart sounds and intact distal pulses.   No murmur heard. Pulmonary/Chest: Effort normal and breath sounds normal. No respiratory distress.  Abdominal: Soft. She exhibits no distension. There is no tenderness.  Genitourinary:  Genitourinary Comments: Prolapsed organ at vaginal opening  Musculoskeletal: She exhibits no edema.  Lymphadenopathy:    She has no cervical adenopathy.  Neurological: She is alert and oriented to person, place, and time.  Skin: Skin is warm.  Large, crusted wound on L lower leg medially w/ oozing and surrounding erythema and warmth Chronic venous stasis changes w/ crusting/peeling of legs bilaterally  Psychiatric: She has a normal mood and affect. Her behavior is normal.  Vitals reviewed.           Assessment & Plan:

## 2016-08-12 ENCOUNTER — Other Ambulatory Visit: Payer: Self-pay | Admitting: Emergency Medicine

## 2016-08-12 ENCOUNTER — Telehealth: Payer: Self-pay | Admitting: Family Medicine

## 2016-08-12 ENCOUNTER — Ambulatory Visit: Payer: Medicare Other | Admitting: Family Medicine

## 2016-08-12 MED ORDER — LEVOTHYROXINE SODIUM 150 MCG PO TABS: 150.0000 ug | ORAL_TABLET | Freq: Every day | ORAL | 1 refills | Status: DC

## 2016-08-12 NOTE — Assessment & Plan Note (Signed)
Chronic problem, on coumadin.  Pt has not followed up w/ coumadin clinic as directed.  Check PT/INR today and adjust meds prn.  Stressed need for regular f/u w/ Coumadin clinic.  Pt expressed understanding and is in agreement w/ plan.

## 2016-08-12 NOTE — Assessment & Plan Note (Signed)
Chronic problem.  Pt was not taking her Levothyroxine prior to her hospitalization.  Her meds were restarted but she is due for repeat TSH.  Again, stressed need for med compliance.  Check labs.  Adjust meds prn

## 2016-08-12 NOTE — Progress Notes (Signed)
Called pt and lmovm to return call.

## 2016-08-12 NOTE — Assessment & Plan Note (Signed)
Chronic problem.  Again w/ ulceration.  Refer to wound center.  Pt expressed understanding and is in agreement w/ plan.

## 2016-08-12 NOTE — Assessment & Plan Note (Signed)
Chronic problem.  Severe.  Pt w/ evidence of infxn today.  Start abx.  Refer to wound care for ongoing management.

## 2016-08-12 NOTE — Telephone Encounter (Signed)
Left patient vm to call back to make appt  °

## 2016-08-12 NOTE — Assessment & Plan Note (Signed)
This was the reason for pt's recent hospitalization.  It was determined that it was not cardiac and she likely had a vagal episode in the bathroom.  No further workup needed at this time.

## 2016-08-12 NOTE — Telephone Encounter (Signed)
Patient returning call regarding message she received to call back for lab results.  She said her battery is dying on her phone and if you would please call back and leave the results on her voicemail, she will check in later.

## 2016-08-12 NOTE — Telephone Encounter (Signed)
Patina called and LMOVM to inform pt of her lab results.

## 2016-08-15 ENCOUNTER — Telehealth: Payer: Self-pay | Admitting: Family Medicine

## 2016-08-30 ENCOUNTER — Encounter (HOSPITAL_BASED_OUTPATIENT_CLINIC_OR_DEPARTMENT_OTHER): Payer: Medicare Other

## 2016-09-06 DIAGNOSIS — N819 Female genital prolapse, unspecified: Secondary | ICD-10-CM | POA: Diagnosis not present

## 2016-09-06 DIAGNOSIS — Z124 Encounter for screening for malignant neoplasm of cervix: Secondary | ICD-10-CM | POA: Diagnosis not present

## 2016-09-11 ENCOUNTER — Other Ambulatory Visit: Payer: Self-pay | Admitting: Cardiology

## 2016-09-12 ENCOUNTER — Ambulatory Visit: Payer: Self-pay | Admitting: Pharmacist

## 2016-09-12 DIAGNOSIS — Z86711 Personal history of pulmonary embolism: Secondary | ICD-10-CM

## 2016-09-12 NOTE — Telephone Encounter (Signed)
Spoke to patient who states Dr. Quay Burow is managing warfarin therapy now. Will deny and request Pharmacy send refill to Dr. Quay Burow

## 2016-09-14 DIAGNOSIS — N819 Female genital prolapse, unspecified: Secondary | ICD-10-CM | POA: Diagnosis not present

## 2016-09-14 DIAGNOSIS — Z1231 Encounter for screening mammogram for malignant neoplasm of breast: Secondary | ICD-10-CM | POA: Diagnosis not present

## 2016-09-15 DIAGNOSIS — N819 Female genital prolapse, unspecified: Secondary | ICD-10-CM | POA: Diagnosis not present

## 2016-09-29 DIAGNOSIS — N819 Female genital prolapse, unspecified: Secondary | ICD-10-CM | POA: Diagnosis not present
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
## 2016-10-10 ENCOUNTER — Ambulatory Visit (INDEPENDENT_AMBULATORY_CARE_PROVIDER_SITE_OTHER): Payer: Medicare Other | Admitting: Internal Medicine

## 2016-10-10 ENCOUNTER — Encounter: Payer: Self-pay | Admitting: Internal Medicine

## 2016-10-10 ENCOUNTER — Other Ambulatory Visit (INDEPENDENT_AMBULATORY_CARE_PROVIDER_SITE_OTHER): Payer: Medicare Other

## 2016-10-10 VITALS — BP 98/72 | HR 58 | Temp 98.2°F | Resp 18 | Ht 68.0 in | Wt 233.0 lb

## 2016-10-10 DIAGNOSIS — M545 Low back pain: Secondary | ICD-10-CM

## 2016-10-10 DIAGNOSIS — I872 Venous insufficiency (chronic) (peripheral): Secondary | ICD-10-CM

## 2016-10-10 DIAGNOSIS — Z7901 Long term (current) use of anticoagulants: Secondary | ICD-10-CM | POA: Insufficient documentation

## 2016-10-10 DIAGNOSIS — E038 Other specified hypothyroidism: Secondary | ICD-10-CM | POA: Diagnosis not present

## 2016-10-10 DIAGNOSIS — F1120 Opioid dependence, uncomplicated: Secondary | ICD-10-CM | POA: Diagnosis not present

## 2016-10-10 DIAGNOSIS — N8110 Cystocele, unspecified: Secondary | ICD-10-CM | POA: Diagnosis not present

## 2016-10-10 DIAGNOSIS — I7025 Atherosclerosis of native arteries of other extremities with ulceration: Secondary | ICD-10-CM | POA: Diagnosis not present

## 2016-10-10 DIAGNOSIS — G8929 Other chronic pain: Secondary | ICD-10-CM | POA: Diagnosis not present

## 2016-10-10 DIAGNOSIS — Z86711 Personal history of pulmonary embolism: Secondary | ICD-10-CM | POA: Diagnosis not present

## 2016-10-10 LAB — PROTIME-INR
INR: 2.4 ratio — AB (ref 0.8–1.0)
PROTHROMBIN TIME: 26.1 s — AB (ref 9.6–13.1)

## 2016-10-10 LAB — TSH: TSH: 2.82 u[IU]/mL (ref 0.35–4.50)

## 2016-10-10 MED ORDER — SILVER SULFADIAZINE 1 % EX CREA: 1.0000 "application " | TOPICAL_CREAM | Freq: Every day | CUTANEOUS | 0 refills | Status: DC

## 2016-10-10 NOTE — Assessment & Plan Note (Signed)
prescribed by neurosurgery

## 2016-10-10 NOTE — Assessment & Plan Note (Signed)
On pain medication - prescribed by neurosurgery Management of pain per above

## 2016-10-10 NOTE — Assessment & Plan Note (Signed)
Takes warfarin daily - history of PE Will get established with Jenny Reichmann Will check INR today

## 2016-10-10 NOTE — Patient Instructions (Addendum)
  Test(s) ordered today. Your results will be released to [place] (or called to you) after review, usually within 72hours after test completion. If any changes need to be made, you will be notified at that same time.   Medications reviewed and updated.  No changes recommended at this time.  You will establish with [facility].   Please followup in 6 months

## 2016-10-10 NOTE — Assessment & Plan Note (Signed)
Managed by gyn Wears a pessary

## 2016-10-10 NOTE — Assessment & Plan Note (Addendum)
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

## 2016-10-10 NOTE — Assessment & Plan Note (Signed)
Check tsh  Titrate med dose if needed  

## 2016-10-10 NOTE — Assessment & Plan Note (Signed)
Taking warfarin daily Check INR today Will establish with cindy Continue warfarin

## 2016-10-10 NOTE — Progress Notes (Signed)
Pre visit review using our clinic review tool, if applicable. No additional management support is needed unless otherwise documented below in the visit note. 

## 2016-10-13 ENCOUNTER — Other Ambulatory Visit: Payer: Self-pay | Admitting: Family Medicine

## 2016-10-20 DIAGNOSIS — N819 Female genital prolapse, unspecified: Secondary | ICD-10-CM | POA: Diagnosis not present

## 2016-11-06 ENCOUNTER — Other Ambulatory Visit: Payer: Self-pay | Admitting: Internal Medicine

## 2016-11-08 DIAGNOSIS — N819 Female genital prolapse, unspecified: Secondary | ICD-10-CM | POA: Diagnosis not present

## 2016-11-08 NOTE — Telephone Encounter (Signed)
Please refill.

## 2016-11-09 NOTE — Telephone Encounter (Signed)
OK.  I was not aware of the change.  Thanks for letting me know.

## 2016-11-18 ENCOUNTER — Encounter: Payer: Self-pay | Admitting: Internal Medicine

## 2016-11-18 ENCOUNTER — Ambulatory Visit (INDEPENDENT_AMBULATORY_CARE_PROVIDER_SITE_OTHER): Payer: Medicare Other | Admitting: Internal Medicine

## 2016-11-18 ENCOUNTER — Ambulatory Visit (INDEPENDENT_AMBULATORY_CARE_PROVIDER_SITE_OTHER): Payer: Medicare Other | Admitting: General Practice

## 2016-11-18 DIAGNOSIS — M170 Bilateral primary osteoarthritis of knee: Secondary | ICD-10-CM | POA: Insufficient documentation

## 2016-11-18 DIAGNOSIS — M25561 Pain in right knee: Secondary | ICD-10-CM

## 2016-11-18 DIAGNOSIS — Z7901 Long term (current) use of anticoagulants: Secondary | ICD-10-CM

## 2016-11-18 DIAGNOSIS — I7025 Atherosclerosis of native arteries of other extremities with ulceration: Secondary | ICD-10-CM | POA: Diagnosis not present

## 2016-11-18 DIAGNOSIS — Z86711 Personal history of pulmonary embolism: Secondary | ICD-10-CM

## 2016-11-18 LAB — POCT INR: INR: 2.3

## 2016-11-18 MED ORDER — PREDNISONE 10 MG PO TABS: ORAL_TABLET | ORAL | 0 refills | Status: DC

## 2016-11-18 NOTE — Progress Notes (Signed)
Subjective:    Patient ID: [name], female    DOB: Nov 29, 1955, 61 y.o.   MRN: [id number]  HPI    Here with right knee, recalls Dr Will Bonnet in 1991 with MRI then, had arthroscopy eventually that helped quite a bit. Has had difficulty with pain meds working in the past working with a neurosurgeon but not really here for that, cant have surgury pt states due to hx of PE.  Most recently was simply trying to stand, and had an icepick pain sudden onset to the anterior right knee x 10 days ago, which eventually improved, but now has full extension without pain, but has quite severe pain anteriorly only since then to flexion and has reduced ROM due to this. Knee itself with mild increased overall  swelling, nor worsening bilat LE swelling than she normally has.  Using walker today, usually walks with cane but fearful of falling. Knee without instabilityor giveaways or falls.  Does have quite a bit of "noise" new however.     Past Medical History:  Diagnosis Date  . Anemia, unspecified   . Anxiety   . Arthritis    "about q joint i've got" (07/14/2016)  . Benign neoplasm of colon 12/2007   Hyperplastic colon polyps  . CAD (coronary artery disease)    minimal catheterization, October 2008  . Cellulitis   . Chest pain    with stress  . Childhood asthma   . Chronic low back pain   . Constipation    and diarrhea chronic..Dr. Alben Spittle  . Depression   . Diverticulosis of colon (without mention of hemorrhage)   . DVT (deep venous thrombosis) (HCC) 1990s   LLE  . Family history of adverse reaction to anesthesia    "daughter died having her 1st child cause she got too much anesthesia"   . GERD (gastroesophageal reflux disease)   . History of blood transfusion 01/2008   "related to hip OR"  . 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 Hypopotassemia   . Hypotension, unspecified   . Leg pain   . 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  . Methadone adverse reaction    for chronic leg and back pain  . Other pulmonary embolism and infarction 2008   Significant 2008 and coumadin therapy RV dysfunction...echo...2008..EF 50%..right ventricle markedly dilated w marked right ventricular dysfunc and moder tricuspid regurg/echo..March 2010, Ef 50%, mild dilation of right ventricule w mild decrease right ventric function   . Peripheral vascular disease ([place])   . Rectus sheath hematoma 07/13/2012   On coumadin   . Right bundle branch block    intermittent  . Thyroid disease   . Tobacco use disorder   . Urinary incontinence   . Venous insufficiency (chronic) (peripheral)    Patient's legs were wrapped 2013   Past Surgical History:  Procedure Laterality Date  . CARDIAC CATHETERIZATION  05/2007   Archie Endo 12/08/2010  . CHOLECYSTECTOMY OPEN    . GASTRIC BYPASS  1980s  . JOINT REPLACEMENT    . KNEE ARTHROSCOPY Left 05/2001   Archie Endo 12/21/2010  . SHOULDER ARTHROSCOPY W/ ROTATOR CUFF REPAIR Right 08/2002   Archie Endo 12/21/2010  . TONSILLECTOMY    . TOTAL HIP ARTHROPLASTY Right 01/2008    reports that she quit smoking about 5 years ago. Her smoking use included Cigarettes. She has a 2.70 pack-year smoking history. She has never used smokeless tobacco. She reports  that she does not drink alcohol or use drugs. family history includes Crohn's disease in her sister; Heart disease in her father and mother. Allergies  Allergen Reactions  . Amitiza [Lubiprostone] Diarrhea and Nausea And Vomiting  . Morphine     REACTION: nausea, rash, itching, vomiting  . Nsaids     Bowel irregularity  . Venlafaxine Other (See Comments)    Reaction unknown   Current Outpatient Prescriptions on File Prior to Visit  Medication Sig Dispense Refill  . HYDROmorphone (DILAUDID) 4 MG tablet TAKE 1 TO 2 TABLETS BY MOUTH EVERY 6 HOURS AS NEEDED FOR BREAKTHROUGH PAIN  0  . levothyroxine (SYNTHROID, LEVOTHROID) 150 MCG tablet TAKE 1 TABLET EVERY DAY BEFORE  BREAKFAST 90 tablet 1  . methadone (DOLOPHINE) 10 MG tablet Take 10 mg by mouth every 8 (eight) hours as needed. for pain  0  . silver sulfADIAZINE (SILVADENE) 1 % cream Apply 1 application topically daily. 50 g 0  . warfarin (COUMADIN) 5 MG tablet Take as directed by coumadin clinic     No current facility-administered medications on file prior to visit.     Review of Systems All other system neg per pt    Objective:   Physical Exam BP 120/78   Pulse 77   SpO2 99%  VS noted,  Constitutional: Pt appears in NAD HENT: Head: NCAT.  Right Ear: External ear normal.  Left Ear: External ear normal.  Eyes: . Pupils are equal, round, and reactive to light. Conjunctivae and EOM are normal Nose: without d/c or deformity Neck: Neck supple. Gross normal ROM Cardiovascular: Normal rate and regular rhythm.   Pulmonary/Chest: Effort normal and breath sounds without rales or wheezing.  Right knee with localized heat, swelling , tender over the patella/patellar tendon/bursa area, knee with reduced ROm due to this to flexion but extension if full and no other significant laterall or post tenderness or swelling Neurological: Pt is alert. At baseline orientation, motor grossly intact Skin: Skin is warm. No rashes, other new lesions, no LE edema Psychiatric: Pt behavior is normal without agitation  No other exam findings    Assessment & Plan:

## 2016-11-18 NOTE — Patient Instructions (Signed)
Pre visit review using our clinic review tool, if applicable. No additional management support is needed unless otherwise documented below in the visit note. 

## 2016-11-18 NOTE — Patient Instructions (Signed)
Please take all new medication as prescribe - the prednisone  Please continue all other medications as before, and refills have been done if requested.  Please have the pharmacy call with any other refills you may need  Please keep your appointments with your specialists as you may have planned  Please make an Appt with Dr Smit/sports medicine as you leave today

## 2016-11-18 NOTE — Assessment & Plan Note (Signed)
Exam c/w patellar tendonitis vs bursitis, for predpac today asd, has methadone for pain, also not nsaid candidate due to warfarin, cont to follow

## 2016-11-18 NOTE — Progress Notes (Signed)
Pre visit review using our clinic review tool, if applicable. No additional management support is needed unless otherwise documented below in the visit note. 

## 2016-11-18 NOTE — Progress Notes (Signed)
I have reviewed and agree with the plan. 

## 2016-11-21 ENCOUNTER — Other Ambulatory Visit: Payer: Self-pay | Admitting: General Practice

## 2016-11-21 MED ORDER — WARFARIN SODIUM 5 MG PO TABS: ORAL_TABLET | ORAL | 0 refills | Status: DC

## 2016-12-07 ENCOUNTER — Ambulatory Visit: Payer: Medicare Other

## 2016-12-13 ENCOUNTER — Other Ambulatory Visit: Payer: Self-pay | Admitting: General Practice

## 2016-12-13 ENCOUNTER — Ambulatory Visit (INDEPENDENT_AMBULATORY_CARE_PROVIDER_SITE_OTHER): Payer: Medicare Other | Admitting: General Practice

## 2016-12-13 ENCOUNTER — Ambulatory Visit: Payer: Medicare Other | Admitting: Family Medicine

## 2016-12-13 DIAGNOSIS — Z7901 Long term (current) use of anticoagulants: Secondary | ICD-10-CM

## 2016-12-13 DIAGNOSIS — Z86711 Personal history of pulmonary embolism: Secondary | ICD-10-CM

## 2016-12-13 LAB — POCT INR: INR: 2.1

## 2016-12-13 NOTE — Progress Notes (Signed)
I have reviewed and agree with the plan. 

## 2016-12-13 NOTE — Patient Instructions (Signed)
Pre visit review using our clinic review tool, if applicable. No additional management support is needed unless otherwise documented below in the visit note. 

## 2016-12-16 ENCOUNTER — Ambulatory Visit: Payer: Medicare Other

## 2017-01-13 ENCOUNTER — Ambulatory Visit: Payer: Medicare Other

## 2017-01-20 ENCOUNTER — Ambulatory Visit (INDEPENDENT_AMBULATORY_CARE_PROVIDER_SITE_OTHER): Payer: Medicare Other | Admitting: General Practice

## 2017-01-20 DIAGNOSIS — Z86711 Personal history of pulmonary embolism: Secondary | ICD-10-CM

## 2017-01-20 DIAGNOSIS — Z7901 Long term (current) use of anticoagulants: Secondary | ICD-10-CM | POA: Diagnosis not present

## 2017-01-20 LAB — POCT INR: INR: 2.3

## 2017-01-20 NOTE — Patient Instructions (Signed)
Pre visit review using our clinic review tool, if applicable. No additional management support is needed unless otherwise documented below in the visit note. 

## 2017-01-20 NOTE — Progress Notes (Signed)
Agree with management.  Junita Kubota J Xavius Spadafore, MD  

## 2017-02-21 ENCOUNTER — Other Ambulatory Visit: Payer: Self-pay | Admitting: Internal Medicine

## 2017-02-22 ENCOUNTER — Ambulatory Visit: Payer: Medicare Other

## 2017-02-23 ENCOUNTER — Other Ambulatory Visit: Payer: Self-pay | Admitting: General Practice

## 2017-02-23 MED ORDER — WARFARIN SODIUM 5 MG PO TABS: ORAL_TABLET | ORAL | 1 refills | Status: DC

## 2017-02-24 ENCOUNTER — Ambulatory Visit (INDEPENDENT_AMBULATORY_CARE_PROVIDER_SITE_OTHER): Payer: Medicare Other | Admitting: General Practice

## 2017-02-24 DIAGNOSIS — Z86711 Personal history of pulmonary embolism: Secondary | ICD-10-CM | POA: Diagnosis not present

## 2017-02-24 DIAGNOSIS — Z7901 Long term (current) use of anticoagulants: Secondary | ICD-10-CM | POA: Diagnosis not present

## 2017-02-24 LAB — POCT INR: INR: 2.8

## 2017-02-24 NOTE — Progress Notes (Signed)
I have reviewed and agree with the plan. 

## 2017-02-24 NOTE — Patient Instructions (Signed)
Pre visit review using our clinic review tool, if applicable. No additional management support is needed unless otherwise documented below in the visit note. 

## 2017-03-20 IMAGING — CT CT ANGIO CHEST
2 of 6 series · 18 of 36 positions shown · IV contrast (Omni 300)
Comparison: Radiographs earlier this day. Most recent CT 01/26/2008

CLINICAL DATA: Syncope and elevated D-dimer. History pulmonary
embolus.

EXAM:
CT ANGIOGRAPHY CHEST WITH CONTRAST
TECHNIQUE: Multidetector CT imaging of the chest was performed using the
standard protocol during bolus administration of intravenous
contrast. Multiplanar CT image reconstructions and MIPs were
obtained to evaluate the vascular anatomy.
CONTRAST:  100 cc Isovue 370 IV

[Series 7: pe thins · axial · 0.73mm/px · z∈[+1298,+1522]mm · 17 of 254 slices shown]
[im 15/254  lung]
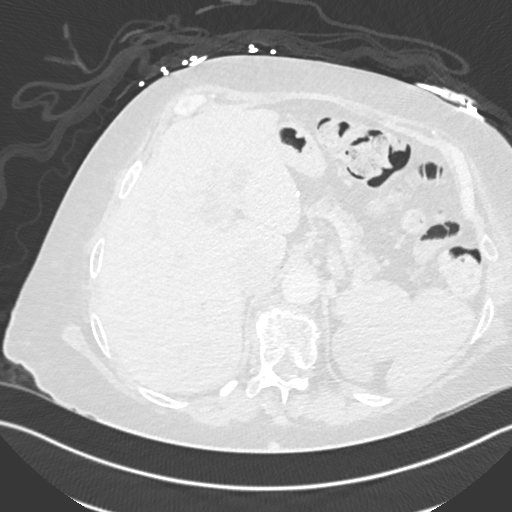
[im 29/254  mediastinal]
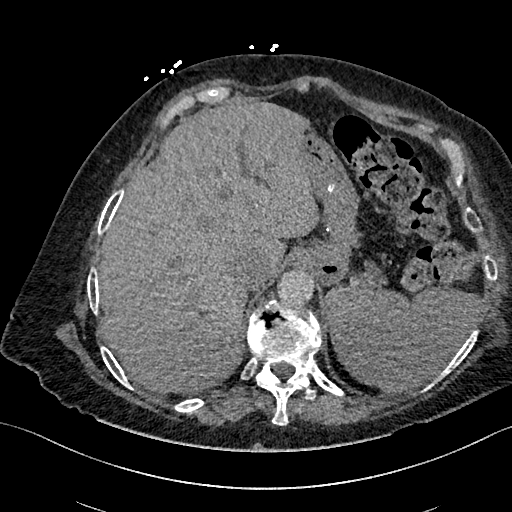
[im 43/254  lung]
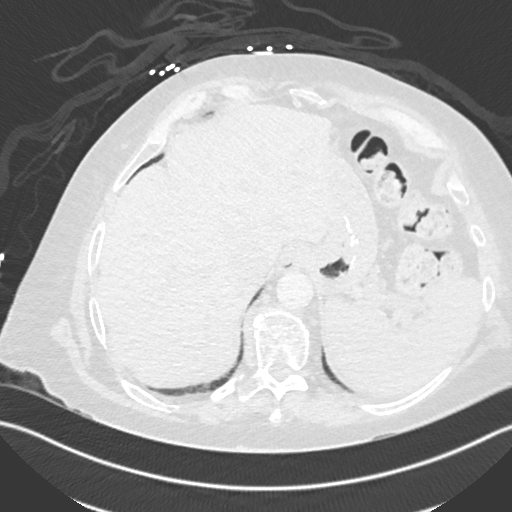
[im 57/254  mediastinal]
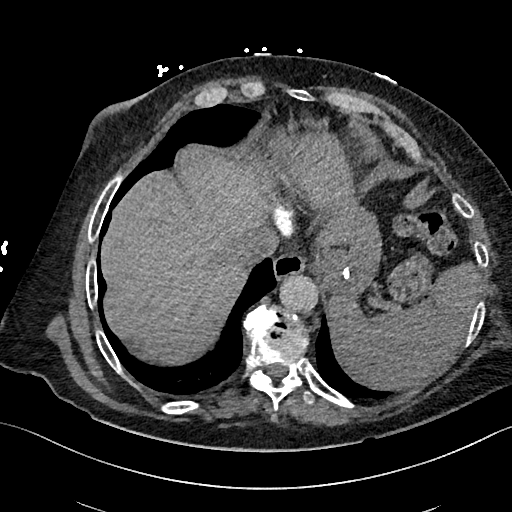
[im 71/254  lung]
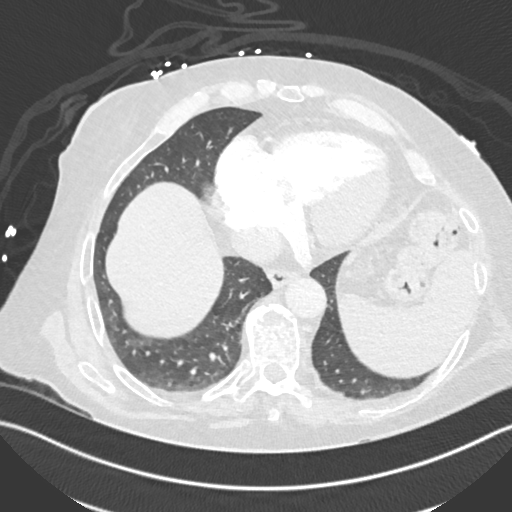
[im 85/254  mediastinal]
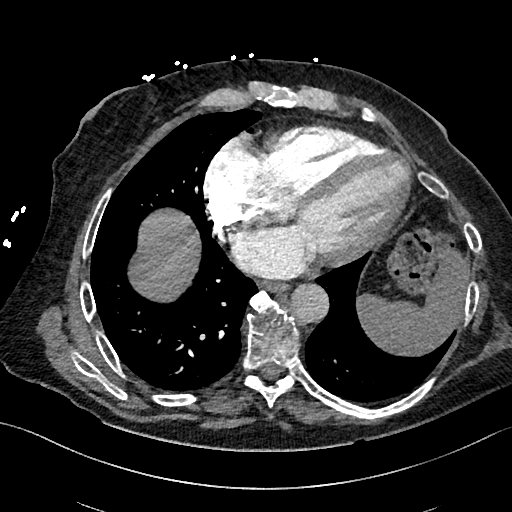
[im 99/254  lung]
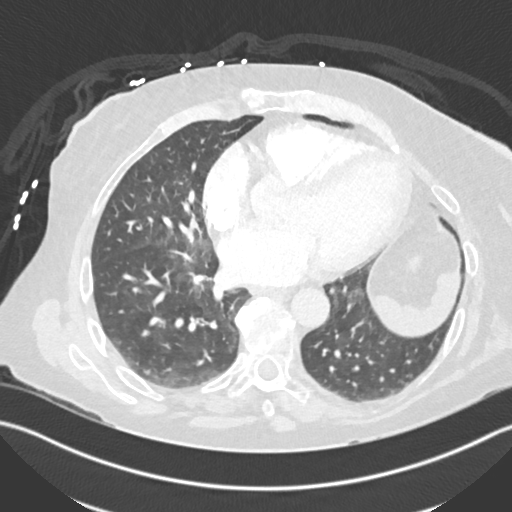
[im 113/254  mediastinal]
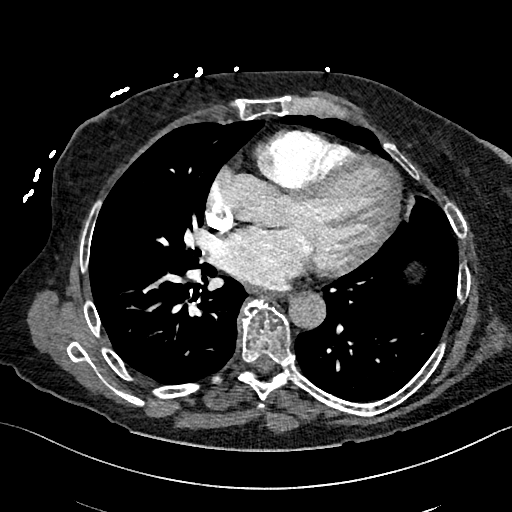
[im 127/254  lung]
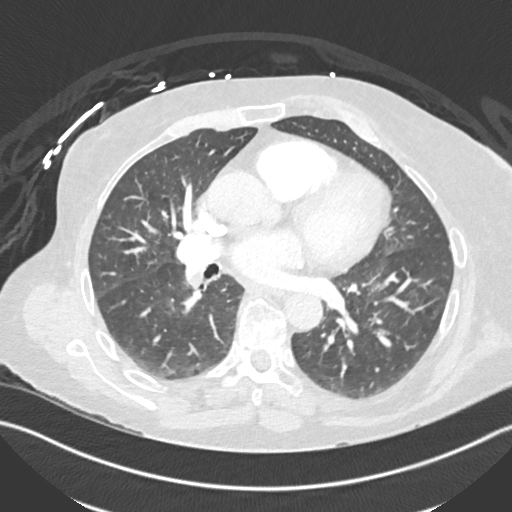
[im 141/254  mediastinal]
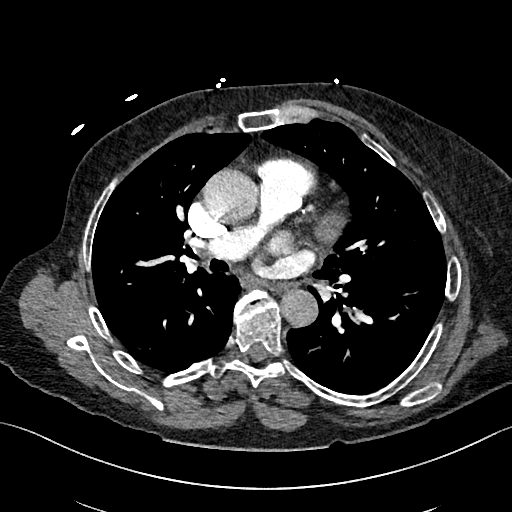
[im 155/254  lung]
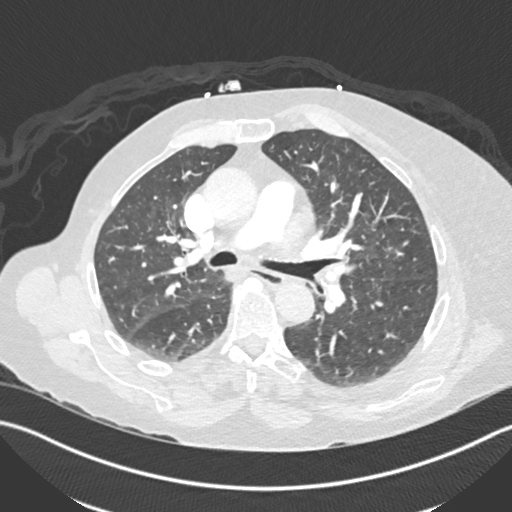
[im 169/254  mediastinal]
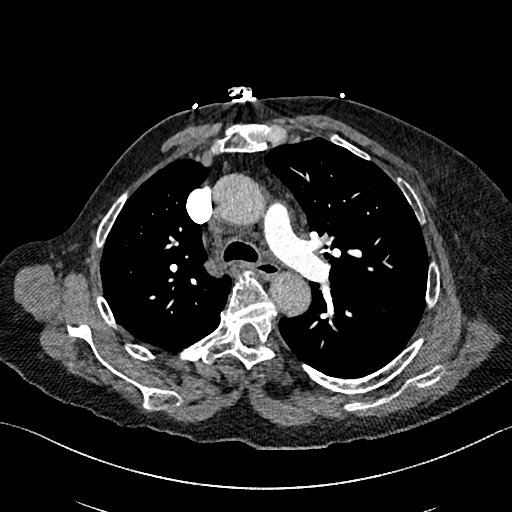
[im 183/254  lung]
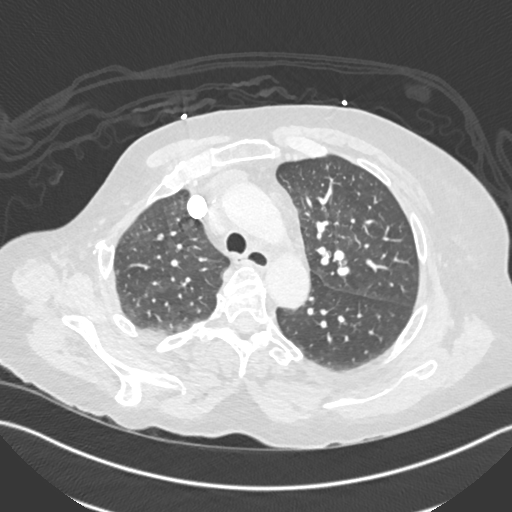
[im 197/254  mediastinal]
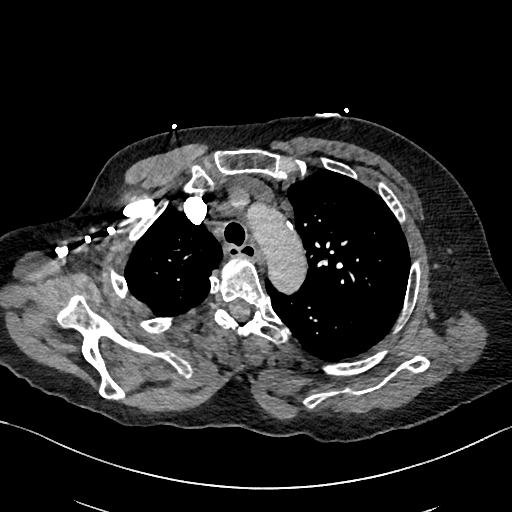
[im 211/254  lung]
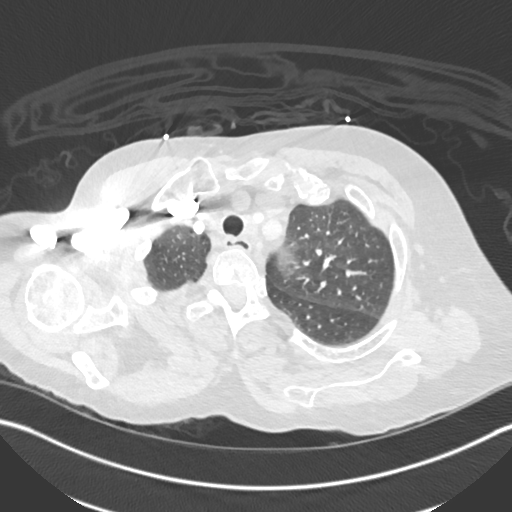
[im 225/254  mediastinal]
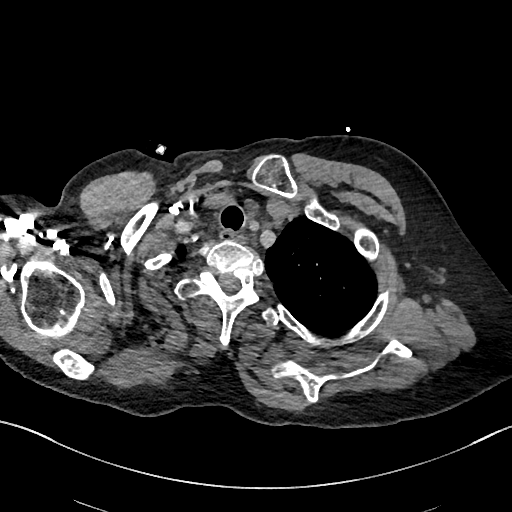
[im 239/254  lung]
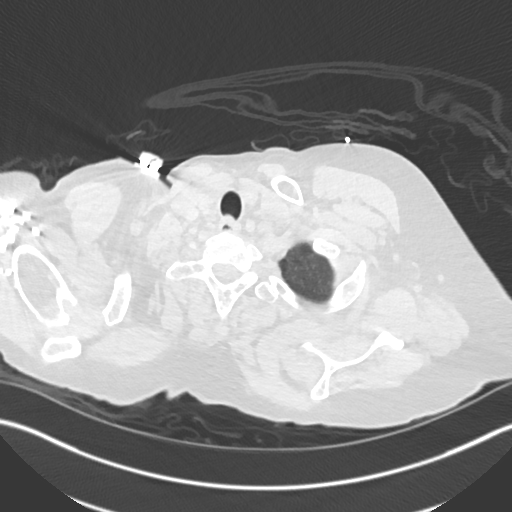

[Series 8: pe 2mm cor · coronal · 0.50mm/px · 1 of 145 slices shown]
[im 73/145  mediastinal]
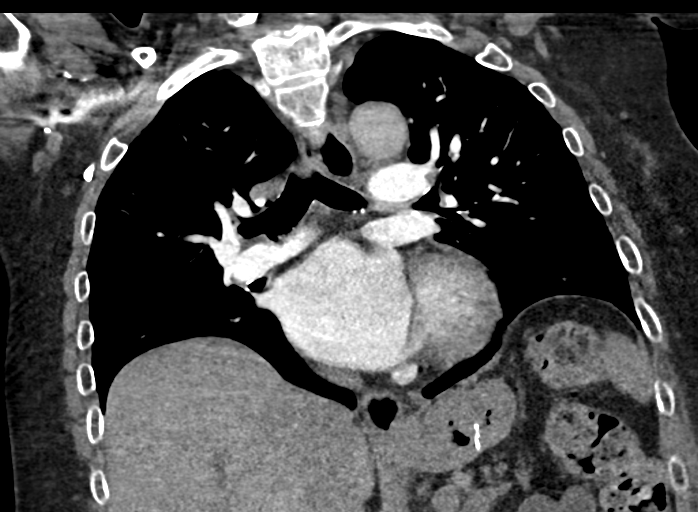

[18 of 36 positions shown; findings below may reference images not displayed]

FINDINGS: Cardiovascular: There are no filling defects within the pulmonary
arteries to suggest pulmonary embolus. No evidence of aortic
dissection. A few coronary artery calcifications are present. Mild
cardiomegaly.

Mediastinum/Nodes: Small paratracheal and prevascular lymph nodes
are unchanged from prior exam, no significant progression. No hilar
adenopathy. No pericardial effusion.

Lungs/Pleura: Diffuse faint ground-glass opacity throughout the
lungs is unchanged, there is bronchial thickening. No confluent
airspace disease. No pulmonary mass or suspicious nodule. No pleural
fluid.

Upper Abdomen: Post gastric bypass.  No acute abnormality.

Musculoskeletal: Multilevel degenerative change in the spine.
Chronic mild loss of height of T10 vertebra. There are no acute or
suspicious osseous abnormalities.

Review of the MIP images confirms the above findings.
IMPRESSION: No pulmonary embolus or acute intrathoracic process.

Minimal coronary artery calcifications.

Chronic mild mediastinal adenopathy and chronic lung disease, stable
dating back to 5668.

## 2017-04-07 ENCOUNTER — Ambulatory Visit (INDEPENDENT_AMBULATORY_CARE_PROVIDER_SITE_OTHER): Payer: Medicare Other | Admitting: General Practice

## 2017-04-07 DIAGNOSIS — Z7901 Long term (current) use of anticoagulants: Secondary | ICD-10-CM

## 2017-04-07 DIAGNOSIS — Z86711 Personal history of pulmonary embolism: Secondary | ICD-10-CM | POA: Diagnosis not present

## 2017-04-07 LAB — POCT INR: INR: 3.3

## 2017-04-07 NOTE — Patient Instructions (Signed)
Pre visit review using our clinic review tool, if applicable. No additional management support is needed unless otherwise documented below in the visit note. 

## 2017-04-07 NOTE — Progress Notes (Signed)
I have reviewed and agree with the plan. 

## 2017-04-11 ENCOUNTER — Other Ambulatory Visit: Payer: Self-pay | Admitting: Internal Medicine

## 2017-04-12 ENCOUNTER — Other Ambulatory Visit (INDEPENDENT_AMBULATORY_CARE_PROVIDER_SITE_OTHER): Payer: Medicare Other

## 2017-04-12 ENCOUNTER — Ambulatory Visit (INDEPENDENT_AMBULATORY_CARE_PROVIDER_SITE_OTHER): Payer: Medicare Other | Admitting: Internal Medicine

## 2017-04-12 ENCOUNTER — Encounter: Payer: Self-pay | Admitting: Internal Medicine

## 2017-04-12 VITALS — BP 124/76 | HR 62 | Temp 97.5°F | Resp 16 | Ht 68.0 in | Wt 299.0 lb

## 2017-04-12 DIAGNOSIS — E038 Other specified hypothyroidism: Secondary | ICD-10-CM

## 2017-04-12 DIAGNOSIS — I872 Venous insufficiency (chronic) (peripheral): Secondary | ICD-10-CM

## 2017-04-12 DIAGNOSIS — Z86711 Personal history of pulmonary embolism: Secondary | ICD-10-CM

## 2017-04-12 DIAGNOSIS — Z23 Encounter for immunization: Secondary | ICD-10-CM | POA: Diagnosis not present

## 2017-04-12 DIAGNOSIS — R600 Localized edema: Secondary | ICD-10-CM

## 2017-04-12 DIAGNOSIS — N8110 Cystocele, unspecified: Secondary | ICD-10-CM

## 2017-04-12 DIAGNOSIS — I7025 Atherosclerosis of native arteries of other extremities with ulceration: Secondary | ICD-10-CM | POA: Diagnosis not present

## 2017-04-12 DIAGNOSIS — Z1211 Encounter for screening for malignant neoplasm of colon: Secondary | ICD-10-CM

## 2017-04-12 DIAGNOSIS — D649 Anemia, unspecified: Secondary | ICD-10-CM | POA: Diagnosis not present

## 2017-04-12 LAB — CBC WITH DIFFERENTIAL/PLATELET
BASOS PCT: 0.6 % (ref 0.0–3.0)
Basophils Absolute: 0 10*3/uL (ref 0.0–0.1)
EOS ABS: 0.1 10*3/uL (ref 0.0–0.7)
EOS PCT: 1.3 % (ref 0.0–5.0)
HEMATOCRIT: 38.1 % (ref 36.0–46.0)
HEMOGLOBIN: 12.2 g/dL (ref 12.0–15.0)
Lymphocytes Relative: 22.1 % (ref 12.0–46.0)
Lymphs Abs: 1.2 10*3/uL (ref 0.7–4.0)
MCHC: 31.9 g/dL (ref 30.0–36.0)
MCV: 87.1 fl (ref 78.0–100.0)
MONO ABS: 0.4 10*3/uL (ref 0.1–1.0)
Monocytes Relative: 8 % (ref 3.0–12.0)
NEUTROS ABS: 3.7 10*3/uL (ref 1.4–7.7)
Neutrophils Relative %: 68 % (ref 43.0–77.0)
PLATELETS: 199 10*3/uL (ref 150.0–400.0)
RBC: 4.37 Mil/uL (ref 3.87–5.11)
RDW: 15.1 % (ref 11.5–15.5)
WBC: 5.4 10*3/uL (ref 4.0–10.5)

## 2017-04-12 LAB — COMPREHENSIVE METABOLIC PANEL
ALBUMIN: 3.7 g/dL (ref 3.5–5.2)
ALT: 9 U/L (ref 0–35)
AST: 19 U/L (ref 0–37)
Alkaline Phosphatase: 79 U/L (ref 39–117)
BUN: 8 mg/dL (ref 6–23)
CALCIUM: 9.5 mg/dL (ref 8.4–10.5)
CHLORIDE: 98 meq/L (ref 96–112)
CO2: 32 mEq/L (ref 19–32)
Creatinine, Ser: 0.73 mg/dL (ref 0.40–1.20)
GFR: 85.49 mL/min (ref >60.00)
Glucose, Bld: 92 mg/dL (ref 70–99)
POTASSIUM: 3.8 meq/L (ref 3.5–5.1)
SODIUM: 138 meq/L (ref 135–145)
Total Bilirubin: 0.5 mg/dL (ref 0.2–1.2)
Total Protein: 7.7 g/dL (ref 6.0–8.3)

## 2017-04-12 LAB — TSH: TSH: 16.85 u[IU]/mL — ABNORMAL HIGH (ref 0.35–4.50)

## 2017-04-12 MED ORDER — LEVOTHYROXINE SODIUM 150 MCG PO TABS: ORAL_TABLET | ORAL | 1 refills | Status: DC

## 2017-04-12 NOTE — Progress Notes (Signed)
Subjective:    Patient ID: [name], female    DOB: 1956/04/09, 61 y.o.   MRN: [id number]  HPI The patient is here for follow up.  Hypothyroidism:  She is taking her medication daily.  She denies any recent changes in energy or weight that are unexplained.   History of Pulmonary embolism on long term anti-coagulation:  She is taking warfarin daily as prescribed.  She is following with Jenny Reichmann.   Her daughter has been causing her stress.  She has had stomach upset and diarrhea from this. Typically she has constipation from her pain medication.  Chronic pain:  She follows with pain management. She is taking her medication as prescribed. She still has chronic pain.  Bilateral (Right> Left) leg swelling:  She did try to move a small refrigerator and injured her right knee.  She has some chronic leg swelling, especially if she is on her feet a long time. After the knee injury the right leg swelling got worse.  She thinks the swelling is slowly improving. She does have arthritis of her knees. She has seen orthopedics in the past and she is not able to have surgery and cannot have any injections due to being on warfarin.  Medications and allergies reviewed with patient and updated if appropriate.  Patient Active Problem List   Diagnosis Date Noted  . Right knee pain 11/18/2016  . Female bladder prolapse 10/10/2016  . Long term current use of anticoagulant 10/10/2016  . Falls frequently 07/14/2016  . Syncope 07/14/2016  . Generalized weakness   . Hyperlipidemia 06/10/2014  . Severe obesity (BMI >= 40) ([place]) 06/10/2014  . CAD (coronary artery disease)   . Interstitial cystitis 01/31/2013  . Chronic narcotic dependence ([place]) 01/30/2013  . S/P hip replacement 11/01/2012  . Venous insufficiency (chronic) (peripheral)   . Chest pain, pleuritic 09/22/2011  . Urinary frequency 09/22/2011  . Atherosclerosis of native arteries of the extremities with ulceration ([place]) 07/21/2011  .  Esophageal spasm 06/14/2011  . Visual floaters 05/31/2011  . Neuropathic pain of lower extremity 05/31/2011  . Constipation 05/12/2011  . Hypothyroidism 01/19/2010  . LEG CRAMPS, NOCTURNAL 01/18/2010  . EDEMA 01/18/2010  . ANEMIA, NORMOCYTIC 08/10/2007  . History of pulmonary embolism 08/10/2007  . 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 LOW BACK PAIN, CHRONIC 08/10/2007  . MEDIASTINAL ADENOPATHY CASTLEMANS D 08/10/2007    Current Outpatient Prescriptions on File Prior to Visit  Medication Sig Dispense Refill  . HYDROmorphone (DILAUDID) 4 MG tablet TAKE 1 TO 2 TABLETS BY MOUTH EVERY 6 HOURS AS NEEDED FOR BREAKTHROUGH PAIN  0  . methadone (DOLOPHINE) 10 MG tablet Take 10 mg by mouth every 8 (eight) hours as needed. for pain  0  . silver sulfADIAZINE (SILVADENE) 1 % cream Apply 1 application topically daily. 50 g 0  . warfarin (COUMADIN) 5 MG tablet Take as directed by anticoagulation clinic 120 tablet 1   No current facility-administered medications on file prior to visit.     Past Medical History:  Diagnosis Date  . Anemia, unspecified   . Anxiety   . Arthritis    "about q joint i've got" (07/14/2016)  . Benign neoplasm of colon 12/2007   Hyperplastic colon polyps  . CAD (coronary artery disease)    minimal catheterization, October 2008  . Cellulitis   . Chest pain    with stress  . Childhood asthma   . Chronic low back pain   . Constipation    and  diarrhea chronic..Dr. Alben Spittle  . Depression   . Diverticulosis of colon (without mention of hemorrhage)   . DVT (deep venous thrombosis) (HCC) 1990s   LLE  . Family history of adverse reaction to anesthesia    "daughter died having her 1st child cause she got too much anesthesia"   . GERD (gastroesophageal reflux disease)   . History of blood transfusion 01/2008   "related to hip OR"  . 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 Hypopotassemia   . Hypotension, unspecified   .  Leg pain   . 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  . Methadone adverse reaction    for chronic leg and back pain  . Other pulmonary embolism and infarction 2008   Significant 2008 and coumadin therapy RV dysfunction...echo...2008..EF 50%..right ventricle markedly dilated w marked right ventricular dysfunc and moder tricuspid regurg/echo..March 2010, Ef 50%, mild dilation of right ventricule w mild decrease right ventric function   . Peripheral vascular disease ([place])   . Rectus sheath hematoma 07/13/2012   On coumadin   . Right bundle branch block    intermittent  . Thyroid disease   . Tobacco use disorder   . Urinary incontinence   . Venous insufficiency (chronic) (peripheral)    Patient's legs were wrapped 2013    Past Surgical History:  Procedure Laterality Date  . CARDIAC CATHETERIZATION  05/2007   Archie Endo 12/08/2010  . CHOLECYSTECTOMY OPEN    . GASTRIC BYPASS  1980s  . JOINT REPLACEMENT    . KNEE ARTHROSCOPY Left 05/2001   Archie Endo 12/21/2010  . SHOULDER ARTHROSCOPY W/ ROTATOR CUFF REPAIR Right 08/2002   Archie Endo 12/21/2010  . TONSILLECTOMY    . TOTAL HIP ARTHROPLASTY Right 01/2008    Social History   Social History  . Marital status: Divorced    Spouse name: N/A  . Number of children: 1  . Years of education: N/A   Occupational History  . Disabled Unemployed   Social History Main Topics  . Smoking status: Former Smoker    Packs/day: 0.10    Years: 27.00    Types: Cigarettes    Quit date: 06/09/2011  . Smokeless tobacco: Never Used  . Alcohol use No     Comment: 07/14/2016 "nothing since the early 1990s"  . Drug use: No  . Sexual activity: Not Asked   Other Topics Concern  . None   Social History Narrative   Current smoker wi last 12 mos.     Family History  Problem Relation Age of Onset  . Heart disease Mother   . Heart disease Father   . Crohn's disease Sister     Review of Systems  Constitutional: Negative for chills and fever.   Respiratory: Negative for cough, shortness of breath and wheezing.   Cardiovascular: Positive for palpitations (occ) and leg swelling. Negative for chest pain.  Endocrine: Positive for cold intolerance.  Neurological: Negative for light-headedness and headaches.       Objective:   Vitals:   04/12/17 1358  BP: 124/76  Pulse: 62  Resp: 16  Temp: (!) 97.5 F (36.4 C)  SpO2: 98%   Wt Readings from Last 3 Encounters:  04/12/17 299 lb (135.6 kg)  10/10/16 233 lb (105.7 kg)  08/11/16 225 lb 4 oz (102.2 kg)   Body mass index is 45.46 kg/m.   Physical Exam    Constitutional: Appears well-developed and well-nourished. No distress.  HENT:  Head: Normocephalic and atraumatic.  Neck: Neck supple. No tracheal deviation present. No thyromegaly present.  No cervical lymphadenopathy Cardiovascular: Normal rate, regular rhythm and normal heart sounds.   No murmur heard. No carotid bruit .  Chronic venous stasis skin changes, moderate edema bilateral lower extremities without breaks in skin Pulmonary/Chest: Effort normal and breath sounds normal. No respiratory distress. No has no wheezes. No rales.  Musculoskeletal: Right knee effusion  Skin: Skin is warm and dry. Not diaphoretic.  Psychiatric: Normal mood and affect. Behavior is normal.      Assessment & Plan:    See Problem List for Assessment and Plan of chronic medical problems.

## 2017-04-12 NOTE — Assessment & Plan Note (Signed)
Secondary to DVTs, chronic venous insufficiency Worse recently secondary to flare of right knee arthritis Swelling improving We'll hold off on diuretics for now, but if swelling does not continue to improve we'll need to consider short-term or low-dose diuretics Check CMP

## 2017-04-12 NOTE — Patient Instructions (Addendum)
  Test(s) ordered today. Your results will be released to [place] (or called to you) after review, usually within 72hours after test completion. If any changes need to be made, you will be notified at that same time.  All other Health Maintenance issues reviewed.   All recommended immunizations and age-appropriate screenings are up-to-date or discussed.  Flu immunization administered today.   Medications reviewed and updated.  No changes recommended at this time.  A referral was ordered for gastroenterology.   Please followup in 6 months

## 2017-04-12 NOTE — Assessment & Plan Note (Signed)
Check CBC 

## 2017-04-12 NOTE — Assessment & Plan Note (Signed)
Check tsh  Titrate med dose if needed  

## 2017-04-12 NOTE — Assessment & Plan Note (Signed)
Chronic swelling with chronic skin changes No current ulcers or skin breakdown Not currently on any diuretics

## 2017-04-12 NOTE — Assessment & Plan Note (Addendum)
Pessary was not effective Seeing gyn Wants to see urology - Dr Rosana Hoes - referred today

## 2017-04-12 NOTE — Assessment & Plan Note (Signed)
Continue warfarin therapy.

## 2017-04-13 ENCOUNTER — Encounter: Payer: Self-pay | Admitting: Internal Medicine

## 2017-04-13 ENCOUNTER — Other Ambulatory Visit: Payer: Self-pay | Admitting: Emergency Medicine

## 2017-04-13 ENCOUNTER — Encounter: Payer: Self-pay | Admitting: Nurse Practitioner

## 2017-04-13 DIAGNOSIS — E038 Other specified hypothyroidism: Secondary | ICD-10-CM

## 2017-04-13 MED ORDER — LEVOTHYROXINE SODIUM 175 MCG PO TABS: 175.0000 ug | ORAL_TABLET | Freq: Every day | ORAL | 0 refills | Status: DC

## 2017-05-01 ENCOUNTER — Ambulatory Visit (INDEPENDENT_AMBULATORY_CARE_PROVIDER_SITE_OTHER): Payer: Medicare Other | Admitting: Nurse Practitioner

## 2017-05-01 ENCOUNTER — Encounter: Payer: Self-pay | Admitting: Nurse Practitioner

## 2017-05-01 VITALS — BP 108/68 | HR 72 | Ht 68.0 in | Wt 300.5 lb

## 2017-05-01 DIAGNOSIS — Z7901 Long term (current) use of anticoagulants: Secondary | ICD-10-CM

## 2017-05-01 DIAGNOSIS — K5909 Other constipation: Secondary | ICD-10-CM

## 2017-05-01 DIAGNOSIS — R079 Chest pain, unspecified: Secondary | ICD-10-CM | POA: Diagnosis not present

## 2017-05-01 DIAGNOSIS — K649 Unspecified hemorrhoids: Secondary | ICD-10-CM | POA: Diagnosis not present

## 2017-05-01 DIAGNOSIS — I7025 Atherosclerosis of native arteries of other extremities with ulceration: Secondary | ICD-10-CM | POA: Diagnosis not present

## 2017-05-01 MED ORDER — HYOSCYAMINE SULFATE 0.125 MG SL SUBL: 0.1250 mg | SUBLINGUAL_TABLET | SUBLINGUAL | 0 refills | Status: DC

## 2017-05-01 NOTE — Patient Instructions (Addendum)
If you are age 63 or older, your body mass index should be between 23-30. Your Body mass index is 45.69 kg/m. If this is out of the aforementioned range listed, please consider follow up with your Primary Care Provider.  If you are age 57 or younger, your body mass index should be between 19-25. Your Body mass index is 45.69 kg/m. If this is out of the aformentioned range listed, please consider follow up with your Primary Care Provider.   We have sent the following medications to your pharmacy for you to pick up at your convenience: Levsin 0.125  Take Benefiber or Citrucel.  Follow up with Tye Savoy on 05/23/17 at 10 00 a.m.  Thank you for choosing me and [facility].   Tye Savoy, NP

## 2017-05-01 NOTE — Progress Notes (Signed)
HPI: Patient is a 61 year old female with history of chronic pain and is under the care of Pain Management.  She has  emphysema, a positive lupus anticoagulant and she is on chronic coumadin for hx of bilateral PEs.  Previously followed by Dr. Deatra Ina for history of constipation, diverticulosis. Her last colonoscopy was May 2009,  a hyperplastic polyps was removed. She received a recall colonoscopy letter but has been told by several of her physicians that she should not be sedated. She is referred by Dr. Quay Burow for colon cancer screening. Hgb 12.2.  She has occasional hemorrhoidal bleeding in setting of constipation. Patient has chronic constipation. Linzess was too unpredictable. Miralax worked but she had to take too much of it. Amitiza caused terrible reaction (itching, burning eyes). Takes OTC laxative which work the best. She thinks it is a combination of stimulant / laxative. She drinks plenty of water.   Prolapsing bladder, sees UroGYN in October .Tried pessary but it would fall out.    Past Medical History:  Diagnosis Date  . Anemia, unspecified   . Anxiety   . Arthritis    "about q joint i've got" (07/14/2016)  . Benign neoplasm of colon 12/2007   Hyperplastic colon polyps  . CAD (coronary artery disease)    minimal catheterization, October 2008  . Cellulitis   . Chest pain    with stress  . Childhood asthma   . Chronic low back pain   . Constipation    and diarrhea chronic..Dr. Alben Spittle  . Depression   . Diverticulosis of colon (without mention of hemorrhage)   . DVT (deep venous thrombosis) (HCC) 1990s   LLE  . Family history of adverse reaction to anesthesia    "daughter died having her 1st child cause she got too much anesthesia"   . GERD (gastroesophageal reflux disease)   . History of blood transfusion 01/2008   "related to hip OR"  . 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 Hypopotassemia   . Hypotension, unspecified    . Leg pain   . 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  . Methadone adverse reaction    for chronic leg and back pain  . Other pulmonary embolism and infarction 2008   Significant 2008 and coumadin therapy RV dysfunction...echo...2008..EF 50%..right ventricle markedly dilated w marked right ventricular dysfunc and moder tricuspid regurg/echo..March 2010, Ef 50%, mild dilation of right ventricule w mild decrease right ventric function   . Peripheral vascular disease ([place])   . Rectus sheath hematoma 07/13/2012   On coumadin   . Right bundle branch block    intermittent  . Thyroid disease   . Tobacco use disorder   . Urinary incontinence   . Venous insufficiency (chronic) (peripheral)    Patient's legs were wrapped 2013    Patient's surgical history, family medical history, social history, medications and allergies were all reviewed in Epic    Physical Exam: BP 108/68   Pulse 72   Ht 5\' 8"  (1.727 m)   Wt (!) 300 lb 8 oz (136.3 kg)   BMI 45.69 kg/m   GENERAL:  Obese white female in NAD PSYCH: :Pleasant, cooperative, normal affect EENT:  conjunctiva pink, mucous membranes moist, neck supple without masses CARDIAC:  RRR, , no peripheral edema PULM: Normal respiratory effort, lungs CTA bilaterally, no wheezing ABDOMEN:  Nondistended, soft, nontender. No obvious masses,   normal bowel sounds SKIN:  turgor, no lesions seen Musculoskeletal:  Normal muscle tone, normal strength NEURO: Alert and oriented x 3, no focal neurologic deficits   ASSESSMENT and PLAN:  1. Pleasant 61 yo old female with multiple medical problems not limited to morbid obesity, COPD, and hx of bilateral PEs. She is chronically anticoagulated with coumadin.   2. Colon cancer screening. She is due for colon cancer screening next May. She is at increased risk for procedures and is justifiably concerned about undergoing colonoscopy. She hasn't had any bowel changes. She is not anemia. No colon  cancer in any primary relatives.  -Cologuard is an option but rectal bleeding (though likely hemorrhoidal) could affect the results.  -Virtual colonoscopy is a option but if abnormal then colonoscopy would still be warranted. Should this be the case, she may require Lovenox bridge  -I'll discuss in more detail with her primary GI who will be Dr. Loletha Carrow .  3. Narcotic induced constipation / Occassional rectal bleeding.  -Will start with adding adding daily fiber -follow up with me in 3-4 weeks.   4. Chest pain, frequently occurs when drinking something cold. Episodes transient, told years ago that it was a spasm, given something to put under tongue and it helped (not NTG) -trial of SL Levsin.    Tye Savoy , NP 05/01/2017, 11:32 AM   Cc: Billey Gosling, MD

## 2017-05-05 NOTE — Progress Notes (Signed)
Thank you for sending this case to me. I have reviewed the entire note, and the outlined plan seems appropriate.  She is at increased risk for colonoscopy due to her medical issues and chronic anticoagulation. As such, a Cologuard seems a better test for her if she is willing to undergo a colonoscopy for positive result. We would need assistance from primary care or whoever manages her anticoagulation for decision on whether or not she could stop Coumadin several days prior and if bridging Lovenox therapy would be necessary.  Wilfrid Lund, MD

## 2017-05-06 ENCOUNTER — Encounter: Payer: Self-pay | Admitting: Internal Medicine

## 2017-05-10 ENCOUNTER — Telehealth: Payer: Self-pay

## 2017-05-10 NOTE — Telephone Encounter (Signed)
-----   Message from Willia Craze, NP sent at 05/08/2017  2:11 PM EDT ----- Jamas Lav, please let patient know that I spoke with Dr. Loletha Carrow and he agrees that a cologuard would be a good option since she is at high risk for colonoscopy. If test is positive she would still need a colonoscopy, most likely necessitating a lovenox or heparin bridge. If she is not willing to do a colonoscopy then not sure that it makes since to pursue cologuard. Just let me know Thanks for signing up for MyChart!

## 2017-05-10 NOTE — Telephone Encounter (Signed)
Spoke with [name]. She wants the Cologuard test ordered. She will inquire about her insurance coverage. If it is not covered, she will decline. Agrees to let us know if this is the case.

## 2017-05-12 NOTE — Telephone Encounter (Signed)
Did I send you this note back as requested?

## 2017-05-19 ENCOUNTER — Ambulatory Visit: Payer: Medicare Other

## 2017-05-23 ENCOUNTER — Other Ambulatory Visit (INDEPENDENT_AMBULATORY_CARE_PROVIDER_SITE_OTHER): Payer: Medicare Other

## 2017-05-23 ENCOUNTER — Other Ambulatory Visit: Payer: Medicare Other

## 2017-05-23 ENCOUNTER — Ambulatory Visit (INDEPENDENT_AMBULATORY_CARE_PROVIDER_SITE_OTHER): Payer: Medicare Other | Admitting: General Practice

## 2017-05-23 ENCOUNTER — Encounter: Payer: Self-pay | Admitting: Nurse Practitioner

## 2017-05-23 ENCOUNTER — Ambulatory Visit (INDEPENDENT_AMBULATORY_CARE_PROVIDER_SITE_OTHER): Payer: Medicare Other | Admitting: Nurse Practitioner

## 2017-05-23 VITALS — BP 110/62 | HR 68 | Ht 68.0 in | Wt 306.0 lb

## 2017-05-23 DIAGNOSIS — R109 Unspecified abdominal pain: Secondary | ICD-10-CM

## 2017-05-23 DIAGNOSIS — E039 Hypothyroidism, unspecified: Secondary | ICD-10-CM | POA: Diagnosis not present

## 2017-05-23 DIAGNOSIS — E038 Other specified hypothyroidism: Secondary | ICD-10-CM | POA: Diagnosis not present

## 2017-05-23 DIAGNOSIS — K5900 Constipation, unspecified: Secondary | ICD-10-CM

## 2017-05-23 DIAGNOSIS — I7025 Atherosclerosis of native arteries of other extremities with ulceration: Secondary | ICD-10-CM

## 2017-05-23 DIAGNOSIS — Z7901 Long term (current) use of anticoagulants: Secondary | ICD-10-CM

## 2017-05-23 DIAGNOSIS — Z86711 Personal history of pulmonary embolism: Secondary | ICD-10-CM

## 2017-05-23 LAB — CBC WITH DIFFERENTIAL/PLATELET
BASOS PCT: 0.4 % (ref 0.0–3.0)
Basophils Absolute: 0 10*3/uL (ref 0.0–0.1)
EOS ABS: 0.1 10*3/uL (ref 0.0–0.7)
Eosinophils Relative: 2.1 % (ref 0.0–5.0)
HCT: 36.6 % (ref 36.0–46.0)
HEMOGLOBIN: 12.2 g/dL (ref 12.0–15.0)
LYMPHS ABS: 1.2 10*3/uL (ref 0.7–4.0)
Lymphocytes Relative: 28.6 % (ref 12.0–46.0)
MCHC: 33.3 g/dL (ref 30.0–36.0)
MCV: 85.9 fl (ref 78.0–100.0)
MONO ABS: 0.4 10*3/uL (ref 0.1–1.0)
Monocytes Relative: 10.2 % (ref 3.0–12.0)
NEUTROS PCT: 58.7 % (ref 43.0–77.0)
Neutro Abs: 2.5 10*3/uL (ref 1.4–7.7)
PLATELETS: 198 10*3/uL (ref 150.0–400.0)
RBC: 4.26 Mil/uL (ref 3.87–5.11)
RDW: 14.6 % (ref 11.5–15.5)
WBC: 4.3 10*3/uL (ref 4.0–10.5)

## 2017-05-23 LAB — TSH: TSH: 10.08 u[IU]/mL — AB (ref 0.35–4.50)

## 2017-05-23 LAB — POCT INR: INR: 2.4

## 2017-05-23 NOTE — Addendum Note (Signed)
Addended by: Sallye Ober on: 05/23/2017 11:22 AM   Modules accepted: Orders

## 2017-05-23 NOTE — Patient Instructions (Signed)
Pre visit review using our clinic review tool, if applicable. No additional management support is needed unless otherwise documented below in the visit note. 

## 2017-05-23 NOTE — Patient Instructions (Addendum)
If you are age 63 or older, your body mass index should be between 23-30. Your Body mass index is 46.53 kg/m. If this is out of the aforementioned range listed, please consider follow up with your Primary Care Provider.  If you are age 78 or younger, your body mass index should be between 19-25. Your Body mass index is 46.53 kg/m. If this is out of the aformentioned range listed, please consider follow up with your Primary Care Provider.    Your physician has requested that you go to the basement for the following lab work before leaving today:  Go to lab for CBC and TSH   Purchase bottle of Magnesium Citrate and take it today as directed on the bottle..  You must drink several glasses of water following the Magnesium Citrate.   Call us Eustaquio Maize, RN) tomorrow with an update. [name] needs to know if your abdominal pain got better after constipation resolved.   Tomorrow increase your fiber to two scoops twice daily. Decrease dose to 1 scoop twice daily if you get too loose of stools.    You have been given information regarding Cologuard.  Thank you for choosing me and [facility].   Carmen Savoy, NP

## 2017-05-23 NOTE — Progress Notes (Signed)
Chief Complaint:  constipation  HPI: Patient is a 61 year old female who I saw late September after PCP referred her for colon cancer screening. She reported constipation and occasional rectal bleeding/ She was intolerant or disliked effects of Linzess, Amitiza and Mirlax.  I started her on daily fiber which initially worked quite well. Now she returns with recurrent constipation. Having mid abdominal pain which she believes is secondary to the constipation. No significant BM in days but no further rectal bleeding. No nausea or vomiting. Patient is depressed about her weight and hypothyroidism. She has gained 73 pounds since March but hasn't really been eating more than usual. Thyroid medication was increased about 6 weeks ago, she is due for a repeat TSH . At our last visit we discussed colon cancer screening. Patient was concerned as she was at increased risk for procedures.   Past Medical History:  Diagnosis Date  . Anemia, unspecified   . Anxiety   . Arthritis    "about q joint i've got" (07/14/2016)  . Benign neoplasm of colon 12/2007   Hyperplastic colon polyps  . CAD (coronary artery disease)    minimal catheterization, October 2008  . Cellulitis   . Chest pain    with stress  . Childhood asthma   . Chronic low back pain   . Constipation    and diarrhea chronic..Dr. Alben Spittle  . Depression   . Diverticulosis of colon (without mention of hemorrhage)   . DVT (deep venous thrombosis) (HCC) 1990s   LLE  . Family history of adverse reaction to anesthesia    "daughter died having her 1st child cause she got too much anesthesia"   . GERD (gastroesophageal reflux disease)   . History of blood transfusion 01/2008   "related to hip OR"  . 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 Hypopotassemia   . Hypotension, unspecified   . Leg pain   . 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  . Methadone adverse reaction    for chronic leg and back pain  . Other pulmonary embolism and infarction 2008   Significant 2008 and coumadin therapy RV dysfunction...echo...2008..EF 50%..right ventricle markedly dilated w marked right ventricular dysfunc and moder tricuspid regurg/echo..March 2010, Ef 50%, mild dilation of right ventricule w mild decrease right ventric function   . Peripheral vascular disease ([place])   . Rectus sheath hematoma 07/13/2012   On coumadin   . Right bundle branch block    intermittent  . Thyroid disease   . Tobacco use disorder   . Urinary incontinence   . Venous insufficiency (chronic) (peripheral)    Patient's legs were wrapped 2013    Patient's surgical history, family medical history, social history, medications and allergies were all reviewed in Epic    Physical Exam: BP 110/62   Pulse 68   Ht 5\' 8"  (1.727 m)   Wt (!) 306 lb (138.8 kg)   BMI 46.53 kg/m   GENERAL:  Morbidly obese white female in NAD PSYCH: :Pleasant, cooperative, normal affect EENT:  conjunctiva pink, mucous membranes moist, neck supple without masses PULM: Normal respiratory effort, lungs CTA bilaterally, no wheezing ABDOMEN:  Obese, soft, nontender. Normal bowel sounds SKIN:  turgor, no lesions seen. Skin on both lower extremities are discolored reddish-brown. Skin is thick and scaly. Musculoskeletal:  Normal muscle tone, normal strength NEURO: Alert and oriented x 3, no focal neurologic deficits  ASSESSMENT and PLAN:  #  Pleasant 61 year old female with chronic constipation. Intolerant or dislikes effects of Miralax, Amitiza and Linzess. I recently saw her and started daily fiber. Initially this worked well but now back constipated.  She stays hydrated.  She is on chronic narcotics and also has been struggling with hypothyroidism which could be contributing to the constipation. Thyoid med increased approximately 6 weeks ago. For follow up labs soon.   -Purge bowels today with Mg+ citrate. Drink plenty of fluids  following the bowel purge to prevent dehydration -starting tomorrow increase fiber to 2 scoops twice daily. If stools too become loose then decrease to one scoop twice daily.   # Mid abdominal pain. She feels this is most likely related to severe constipation. She is mild to moderately tender on exam.  -CBC -Patient will call Beth (RN) tomorrow with an update. Hopefully pain resolves along with constipation but if not been will need further workup.   # Colon cancer screening. 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 She is at high risk for colon cancer screening. Will proceed with Cologuard. I asked her to wait a couple weeks before doing the study. Would like to make sure constipation has resolved as straining could possibly lead to minor rectal bleeding causing false positive study .  # Occasional esophageal / chest pain related to hot and cold foods. I gave her sublingual Levsin to try at last visit. Since then she has had one episode and the Levsin worked very well  Tye Savoy , NP 05/23/2017, 10:14 AM

## 2017-05-24 ENCOUNTER — Telehealth: Payer: Self-pay | Admitting: Nurse Practitioner

## 2017-05-24 LAB — THYROID ANTIBODIES
Thyroglobulin Ab: 1 IU/mL (ref <=1)
Thyroperoxidase Ab SerPl-aCnc: >900 IU/mL — ABNORMAL HIGH (ref <9)

## 2017-05-24 NOTE — Telephone Encounter (Signed)
Left a message to call back.

## 2017-05-26 ENCOUNTER — Other Ambulatory Visit: Payer: Self-pay | Admitting: Emergency Medicine

## 2017-05-26 DIAGNOSIS — E038 Other specified hypothyroidism: Secondary | ICD-10-CM

## 2017-05-26 MED ORDER — LEVOTHYROXINE SODIUM 200 MCG PO TABS: 200.0000 ug | ORAL_TABLET | Freq: Every day | ORAL | 1 refills | Status: DC

## 2017-05-26 NOTE — Telephone Encounter (Signed)
Patient states that the magnesium did not work. Best # [phone]

## 2017-05-26 NOTE — Telephone Encounter (Signed)
Patient has had numerous bowel movements. She is now passing brown liquid. She is sore in her abdomen from "straining". She will try moist heat or a heating pad. Call back on Tuesday with an update. Is this okay?

## 2017-05-28 NOTE — Progress Notes (Signed)
Agree with management of coumadin.  Binnie Rail, MD

## 2017-05-29 ENCOUNTER — Encounter: Payer: Self-pay | Admitting: Internal Medicine

## 2017-05-29 NOTE — Progress Notes (Signed)
Thank you for sending this case to me. I have reviewed the entire note, and the outlined plan seems appropriate.  Cologuard seems a better screening test for this patient than colonoscopy. Continued pain despite reasonable relief of constipation warrants imaging and possibly diagnostic colonoscopy (which may have to be at hospital endoscopy lab). 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  Wilfrid Lund, MD

## 2017-05-30 ENCOUNTER — Encounter: Payer: Self-pay | Admitting: Nurse Practitioner

## 2017-05-31 DIAGNOSIS — Z1211 Encounter for screening for malignant neoplasm of colon: Secondary | ICD-10-CM | POA: Diagnosis not present

## 2017-05-31 DIAGNOSIS — Z1212 Encounter for screening for malignant neoplasm of rectum: Secondary | ICD-10-CM | POA: Diagnosis not present

## 2017-05-31 NOTE — Telephone Encounter (Signed)
Beth, If she still has abdominal pain then we need to arrange for imaging. If she is just sore from straining then I am not too concerned.

## 2017-05-31 NOTE — Telephone Encounter (Signed)
I called to check on the patient. Her abdominal discomfort did improve. However she has not had a bowel movement since she purged 7 days ago. Passing gas. Taking fiber and pushing fluids. She says the more she takes the fiber, the more gassy she gets. She states she will repeat the purge. What do you want her to do for the constipation from here.

## 2017-06-01 ENCOUNTER — Telehealth: Payer: Self-pay | Admitting: Nurse Practitioner

## 2017-06-01 NOTE — Telephone Encounter (Signed)
The patient states her bowels are moving normally today . She did not take magnesium citrate. She has completed her Cologard test.

## 2017-06-05 ENCOUNTER — Encounter: Payer: Self-pay | Admitting: Internal Medicine

## 2017-06-05 NOTE — Telephone Encounter (Signed)
Okay, fiber seems to be causing more problems for her. Just have her stop the fiber. Lets try Amitiza 24 mcg with food. If she doesn't have a follow up with me then please ask her to make one. Thanks

## 2017-06-05 NOTE — Telephone Encounter (Signed)
Amitiza causes nausea vomiting and diarrhea. Want to try linzess?

## 2017-06-11 ENCOUNTER — Encounter: Payer: Self-pay | Admitting: Internal Medicine

## 2017-06-11 DIAGNOSIS — G8929 Other chronic pain: Secondary | ICD-10-CM

## 2017-06-11 DIAGNOSIS — M549 Dorsalgia, unspecified: Principal | ICD-10-CM

## 2017-06-12 ENCOUNTER — Other Ambulatory Visit: Payer: Self-pay

## 2017-06-12 LAB — COLOGUARD: COLOGUARD: POSITIVE

## 2017-06-13 ENCOUNTER — Telehealth: Payer: Self-pay | Admitting: Internal Medicine

## 2017-06-13 NOTE — Telephone Encounter (Signed)
No. Please get her an appt with whoever her primary GI is. Thanks

## 2017-06-13 NOTE — Telephone Encounter (Signed)
Noted. HM updated

## 2017-06-13 NOTE — Telephone Encounter (Signed)
FYI: Patient wanted to make sure we are aware that her Cologard test is positive and she has a GI appointment on 11/8.

## 2017-06-13 NOTE — Telephone Encounter (Signed)
Patient contacted and rescheduled with Dr Loletha Carrow. I noticed she also has a positive Cologard.

## 2017-06-15 ENCOUNTER — Other Ambulatory Visit: Payer: Self-pay

## 2017-06-15 ENCOUNTER — Ambulatory Visit (INDEPENDENT_AMBULATORY_CARE_PROVIDER_SITE_OTHER): Payer: Medicare Other | Admitting: Gastroenterology

## 2017-06-15 ENCOUNTER — Encounter: Payer: Self-pay | Admitting: Gastroenterology

## 2017-06-15 VITALS — BP 106/60 | HR 68 | Ht 65.0 in | Wt 307.1 lb

## 2017-06-15 DIAGNOSIS — Z7901 Long term (current) use of anticoagulants: Secondary | ICD-10-CM | POA: Diagnosis not present

## 2017-06-15 DIAGNOSIS — Z86711 Personal history of pulmonary embolism: Secondary | ICD-10-CM | POA: Diagnosis not present

## 2017-06-15 DIAGNOSIS — R1033 Periumbilical pain: Secondary | ICD-10-CM

## 2017-06-15 DIAGNOSIS — K5900 Constipation, unspecified: Secondary | ICD-10-CM

## 2017-06-15 DIAGNOSIS — J439 Emphysema, unspecified: Secondary | ICD-10-CM | POA: Diagnosis not present

## 2017-06-15 DIAGNOSIS — R195 Other fecal abnormalities: Secondary | ICD-10-CM | POA: Diagnosis not present

## 2017-06-15 DIAGNOSIS — I7025 Atherosclerosis of native arteries of other extremities with ulceration: Secondary | ICD-10-CM | POA: Diagnosis not present

## 2017-06-15 MED ORDER — NA SULFATE-K SULFATE-MG SULF 17.5-3.13-1.6 GM/177ML PO SOLN: 1.0000 | Freq: Once | ORAL | 0 refills | Status: AC

## 2017-06-15 NOTE — Progress Notes (Signed)
[facility] Progress Note  Chief Complaint: Abdominal pain and constipation  Subjective  History:  This is a 61 year old woman seen by our nurse practitioner polyp twice in the last 2 months. She has chronic upper abdominal to periumbilical abdominal pain and constipation, felt at least possibly due to chronic opioid therapy. She was originally sent to [place] for consideration of colon cancer screening. Her medical issues and chronic anticoagulation make her at increased risk for procedures, so Cologuard test was done. This came back positive, and she was here to discuss the significance of this and plan. [name] is accompanied by her mother today. She'll has gotten a little or no relief from taking various therapies for constipation so far, the only thing that seems to help as a bottle magnesium citrate.  ROS: Cardiovascular:  no chest pain Respiratory: no dyspnea  The patient's Past Medical, Family and Social History were reviewed and are on file in the EMR. She has a history of prior DVT/PE and is on chronic Coumadin for that. In addition, there is a history of chronic pain syndrome and severe peripheral edema. Her detailed health issues are noted in the September 24 office note.  Objective:  Med list reviewed  Current Outpatient Medications:  .  calcium carbonate (TUMS - DOSED IN MG ELEMENTAL CALCIUM) 500 MG chewable tablet, Chew 1 tablet by mouth as needed for indigestion or heartburn., Disp: , Rfl:  .  HYDROmorphone (DILAUDID) 4 MG tablet, TAKE 1 TO 2 TABLETS BY MOUTH EVERY 6 HOURS AS NEEDED FOR BREAKTHROUGH PAIN, Disp: , Rfl: 0 .  hyoscyamine (LEVSIN SL) 0.125 MG SL tablet, Place 1 tablet (0.125 mg total) under the tongue as directed. Take 1-2 daily as needed., Disp: 30 tablet, Rfl: 0 .  levothyroxine (SYNTHROID) 200 MCG tablet, Take 1 tablet (200 mcg total) by mouth daily before breakfast., Disp: 90 tablet, Rfl: 1 .  methadone (DOLOPHINE) 10 MG tablet, Take 10 mg by mouth  every 8 (eight) hours as needed. for pain, Disp: , Rfl: 0 .  warfarin (COUMADIN) 5 MG tablet, Take as directed by anticoagulation clinic, Disp: 120 tablet, Rfl: 1 .  Na Sulfate-K Sulfate-Mg Sulf 17.5-3.13-1.6 GM/177ML SOLN, Take 1 kit once for 1 dose by mouth., Disp: 354 mL, Rfl: 0   Vital signs in last 24 hrs: Vitals:   06/15/17 1434  BP: 106/60  Pulse: 68    Physical Exam  Chronically ill-appearing, morbidly obese woman limited mobility, is able to get on the exam table slowly and without assistance.  HEENT: sclera anicteric, oral mucosa moist without lesions  Neck: supple, no thyromegaly, JVD or lymphadenopathy  Cardiac: RRR without murmurs, S1S2 heard, weeping leg edema bilaterally  Pulm: clear to auscultation bilaterally, normal RR and effort noted  Abdomen: soft, there is a fullness in the upper abdomen around a midline scar in the area is tender as well. Normal and active bowel sounds. Cannot assess if any hepato-splenomegaly due to BMI.  Skin; warm and dry, no jaundice or rash. Brawny erythema on the legs from edema  Recent Labs:  Cologuard test positive    '@ASSESSMENTPLANBEGIN' @ Assessment: Encounter Diagnoses  Name Primary?  . Constipation, unspecified constipation type Yes  . Periumbilical abdominal pain   . Positive colorectal cancer screening using Cologuard test   . Pulmonary emphysema, unspecified emphysema type ([place])   . Atherosclerosis of native arteries of the extremities with ulceration ([place])   . History of pulmonary embolism   . Severe obesity (BMI >=  40) ([place])   . Current use of long term anticoagulation    Her abdominal pain and constipation seem most likely due to affective opioids. Other considerations would be she'll obstruction or neoplasia. The significance of her Cologuard is uncertain, we discussed how it could be false-positive, precancerous polyps or less likely malignancy.  Plan: CT scan abdomen and pelvis due to the abdominal pain,  tenderness and exam findings Colonoscopy for positive Cologuard.  It appears the primary care manages her anticoagulation, so we will consult with them regarding having her stop it 4 days prior to the procedure. We need to know if the patient should be on bridging Lovenox, and then management of that if so. She is agreeable to the procedure after discussion of the risks. The benefits and risks of the planned procedure were described in detail with the patient or (when appropriate) their health care proxy.  Risks were outlined as including, but not limited to, bleeding, infection, perforation, adverse medication reaction leading to cardiac or pulmonary decompensation, or pancreatitis (if ERCP).  The limitation of incomplete mucosal visualization was also discussed.  No guarantees or warranties were given.  This patient's procedure must be done in the hospital endoscopy lab due to multiple comorbidities which increase the risk.  Total time 45 minutes face-to-face, over half spent in counseling and coordination of care.   [name]

## 2017-06-15 NOTE — Patient Instructions (Addendum)
If you are age 63 or older, your body mass index should be between 23-30. Your Body mass index is 51.11 kg/m. If this is out of the aforementioned range listed, please consider follow up with your Primary Care Provider.  If you are age 63 or older, your body mass index should be between 19-25. Your Body mass index is 51.11 kg/m. If this is out of the aformentioned range listed, please consider follow up with your Primary Care Provider.   You will be contacted by our office prior to your procedure for directions on holding your COUMADIN.  If you do not hear from our office 1 week prior to your scheduled procedure, please call [phone] to discuss.   You have been scheduled for a colonoscopy. Please follow written instructions given to you at your visit today.  Please pick up your prep supplies at the pharmacy within the next 1-3 days. If you use inhalers (even only as needed), please bring them with you on the day of your procedure. Your physician has requested that you go to www.startemmi.com and enter the access code given to you at your visit today. This web site gives a general overview about your procedure. However, you should still follow specific instructions given to you by our office regarding your preparation for the procedure.  You have been scheduled for a CT scan of the abdomen and pelvis at [facility] ([address]---this is in the same building as [facility]).   You are scheduled on 06-23-2017 at 1130am. You should arrive 15 minutes prior to your appointment time for registration. Please follow the written instructions below on the day of your exam:  WARNING: IF YOU ARE ALLERGIC TO IODINE/X-RAY DYE, PLEASE NOTIFY RADIOLOGY IMMEDIATELY AT [phone]! YOU WILL BE GIVEN A 13 HOUR PREMEDICATION PREP.  1) Do not eat or drink anything after 730am (4 hours prior to your test) 2) You have been given 2 bottles of oral contrast to drink. The solution may taste                better if refrigerated, but do NOT add ice or any other liquid to this solution. Shake             well before drinking.    Drink 1 bottle of contrast @ 930am (2 hours prior to your exam)  Drink 1 bottle of contrast @ 1030am (1 hour prior to your exam)  You may take any medications as prescribed with a small amount of water except for the following: Metformin, Glucophage, Glucovance, Avandamet, Riomet, Fortamet, Actoplus Met, Janumet, Glumetza or Metaglip. The above medications must be held the day of the exam AND 48 hours after the exam.  The purpose of you drinking the oral contrast is to aid in the visualization of your intestinal tract. The contrast solution may cause some diarrhea. Before your exam is started, you will be given a small amount of fluid to drink. Depending on your individual set of symptoms, you may also receive an intravenous injection of x-ray contrast/dye. Plan on being at [facility] for 30 minutes or longer, depending on the type of exam you are having performed.  This test typically takes 30-45 minutes to complete.  If you have any questions regarding your exam or if you need to reschedule, you may call the CT department at [phone] between the hours of 8:00 am and 5:00 pm, Monday-Friday.  Thank you for choosing [facility]  Dr Wilfrid Lund  III   ________________________________________________________________________

## 2017-06-16 ENCOUNTER — Other Ambulatory Visit: Payer: Self-pay

## 2017-06-16 ENCOUNTER — Telehealth: Payer: Self-pay

## 2017-06-16 NOTE — Telephone Encounter (Signed)
06/16/2017   RE: [name] DOB: Oct 20, 1955 MRN: [id number]   Dear Dr. Quay Burow,    We have scheduled the above patient for an endoscopic procedure(colonoscopy). Our records show that she is on anticoagulation therapy.   Please advise as to how long the patient may come off her therapy of Coumadin prior to the procedure, which is scheduled for 08-11-2017.  Please fax back/ or route the completed form to Christell Constant at [phone]).

## 2017-06-19 NOTE — Telephone Encounter (Signed)
We will stop warfarin and bridge with lovenox.  We will discuss with the patient and give her instructions.    Jenny Reichmann - can you please instruct her.

## 2017-06-19 NOTE — Telephone Encounter (Signed)
Left a message to return call.  

## 2017-06-20 ENCOUNTER — Ambulatory Visit: Payer: Medicare Other | Admitting: Nurse Practitioner

## 2017-06-20 NOTE — Telephone Encounter (Signed)
Spoke to pt. She will call and talk to [facility] about setting up her lovenox bridge.

## 2017-06-22 ENCOUNTER — Other Ambulatory Visit: Payer: Self-pay

## 2017-06-22 DIAGNOSIS — R1033 Periumbilical pain: Secondary | ICD-10-CM

## 2017-06-22 DIAGNOSIS — K5900 Constipation, unspecified: Secondary | ICD-10-CM

## 2017-06-23 ENCOUNTER — Other Ambulatory Visit (INDEPENDENT_AMBULATORY_CARE_PROVIDER_SITE_OTHER): Payer: Medicare Other

## 2017-06-23 ENCOUNTER — Ambulatory Visit (INDEPENDENT_AMBULATORY_CARE_PROVIDER_SITE_OTHER)
Admission: RE | Admit: 2017-06-23 | Discharge: 2017-06-23 | Disposition: A | Discharge status: Home / Self Care | Payer: Medicare Other | Source: Ambulatory Visit | Attending: Gastroenterology | Admitting: Gastroenterology

## 2017-06-23 DIAGNOSIS — R1033 Periumbilical pain: Secondary | ICD-10-CM | POA: Diagnosis not present

## 2017-06-23 DIAGNOSIS — K5900 Constipation, unspecified: Secondary | ICD-10-CM

## 2017-06-23 DIAGNOSIS — K5730 Diverticulosis of large intestine without perforation or abscess without bleeding: Secondary | ICD-10-CM | POA: Diagnosis not present

## 2017-06-23 LAB — BUN: BUN: 9 mg/dL (ref 6–23)

## 2017-06-23 LAB — CREATININE, SERUM: CREATININE: 0.78 mg/dL (ref 0.40–1.20)

## 2017-06-23 MED ORDER — IOPAMIDOL (ISOVUE-300) INJECTION 61%
100.0000 mL | Freq: Once | INTRAVENOUS | Status: AC | PRN
  Administered 2017-06-23: 100 mL via INTRAVENOUS

## 2017-07-04 NOTE — Telephone Encounter (Signed)
Hey Dr. Quay Burow,  I don't think this patient is going to be able to afford Lovenox (for the bridge).  How do you feel about bridging with Xarelto?  We usually have samples.  Jenny Reichmann

## 2017-07-04 NOTE — Telephone Encounter (Signed)
Sounds good to me. Thanks!

## 2017-07-14 ENCOUNTER — Ambulatory Visit (INDEPENDENT_AMBULATORY_CARE_PROVIDER_SITE_OTHER): Payer: Medicare Other | Admitting: General Practice

## 2017-07-14 DIAGNOSIS — Z86711 Personal history of pulmonary embolism: Secondary | ICD-10-CM

## 2017-07-14 DIAGNOSIS — Z7901 Long term (current) use of anticoagulants: Secondary | ICD-10-CM

## 2017-07-14 DIAGNOSIS — I7025 Atherosclerosis of native arteries of other extremities with ulceration: Secondary | ICD-10-CM | POA: Diagnosis not present

## 2017-07-14 LAB — POCT INR: INR: 2.6

## 2017-07-14 NOTE — Patient Instructions (Addendum)
Pre visit review using our clinic review tool, if applicable. No additional management support is needed unless otherwise documented below in the visit note.  Continue to take 1 tablet (5 mg) Sun/Tue/Thur/Sat and 2 tablets (10 mg) Mon/Wed/Fri.  Please follow pt instructions!   12/30 - Last dose of coumadin until after procedure 12/31 - Nothing 1/1 - 1 dose of Xarelto 1/2 - 1 dose of Xarelto 1/3 - Nothing 1/4 - Nothing 1/5 - 1 dose of Xarelto and 2 tablets of coumadin 1/6 - 1 dose of Xarelto and 2 tablets of coumadin 1/7 - 1 dose of Xarelto and 1 1/2 tablets of coumadin 1/8 - Continue coumadin as dosed.  Re-check INR in clinic.

## 2017-07-15 NOTE — Progress Notes (Signed)
Agree.  Carmen Knoll J Karra Pink, MD  

## 2017-07-28 ENCOUNTER — Other Ambulatory Visit: Payer: Self-pay

## 2017-07-28 ENCOUNTER — Encounter (HOSPITAL_COMMUNITY): Payer: Self-pay | Admitting: Emergency Medicine

## 2017-08-07 ENCOUNTER — Encounter: Payer: Self-pay | Admitting: Internal Medicine

## 2017-08-09 ENCOUNTER — Telehealth: Payer: Self-pay | Admitting: Gastroenterology

## 2017-08-09 NOTE — Telephone Encounter (Signed)
Returned patients call. She was confused on the 2 day prep instructions. We went over the directions and all is clear now. She understood the directions clearly and will proceed as directed.

## 2017-08-10 ENCOUNTER — Encounter: Payer: Self-pay | Admitting: Internal Medicine

## 2017-08-11 ENCOUNTER — Encounter (HOSPITAL_COMMUNITY): Payer: Self-pay | Admitting: *Deleted

## 2017-08-11 ENCOUNTER — Encounter (HOSPITAL_COMMUNITY)
Admission: RE | Disposition: A | Discharge status: Home / Self Care | Payer: Self-pay | Source: Ambulatory Visit | Attending: Gastroenterology

## 2017-08-11 ENCOUNTER — Ambulatory Visit (HOSPITAL_COMMUNITY)
Admission: RE | Admit: 2017-08-11 | Discharge: 2017-08-11 | Disposition: A | Discharge status: Home / Self Care | Payer: Medicare Other | Source: Ambulatory Visit | Attending: Gastroenterology | Admitting: Gastroenterology

## 2017-08-11 ENCOUNTER — Other Ambulatory Visit: Payer: Self-pay

## 2017-08-11 ENCOUNTER — Ambulatory Visit (HOSPITAL_COMMUNITY): Payer: Medicare Other | Admitting: Anesthesiology

## 2017-08-11 DIAGNOSIS — I4510 Unspecified right bundle-branch block: Secondary | ICD-10-CM | POA: Insufficient documentation

## 2017-08-11 DIAGNOSIS — Z888 Allergy status to other drugs, medicaments and biological substances status: Secondary | ICD-10-CM | POA: Insufficient documentation

## 2017-08-11 DIAGNOSIS — F329 Major depressive disorder, single episode, unspecified: Secondary | ICD-10-CM | POA: Diagnosis not present

## 2017-08-11 DIAGNOSIS — D123 Benign neoplasm of transverse colon: Secondary | ICD-10-CM | POA: Insufficient documentation

## 2017-08-11 DIAGNOSIS — K5900 Constipation, unspecified: Secondary | ICD-10-CM

## 2017-08-11 DIAGNOSIS — I2510 Atherosclerotic heart disease of native coronary artery without angina pectoris: Secondary | ICD-10-CM | POA: Diagnosis not present

## 2017-08-11 DIAGNOSIS — Z885 Allergy status to narcotic agent status: Secondary | ICD-10-CM | POA: Diagnosis not present

## 2017-08-11 DIAGNOSIS — J45909 Unspecified asthma, uncomplicated: Secondary | ICD-10-CM | POA: Diagnosis not present

## 2017-08-11 DIAGNOSIS — Z886 Allergy status to analgesic agent status: Secondary | ICD-10-CM | POA: Diagnosis not present

## 2017-08-11 DIAGNOSIS — Z86010 Personal history of colonic polyps: Secondary | ICD-10-CM | POA: Diagnosis not present

## 2017-08-11 DIAGNOSIS — Z86718 Personal history of other venous thrombosis and embolism: Secondary | ICD-10-CM | POA: Diagnosis not present

## 2017-08-11 DIAGNOSIS — K219 Gastro-esophageal reflux disease without esophagitis: Secondary | ICD-10-CM | POA: Diagnosis not present

## 2017-08-11 DIAGNOSIS — I7025 Atherosclerosis of native arteries of other extremities with ulceration: Secondary | ICD-10-CM

## 2017-08-11 DIAGNOSIS — K5730 Diverticulosis of large intestine without perforation or abscess without bleeding: Secondary | ICD-10-CM | POA: Diagnosis not present

## 2017-08-11 DIAGNOSIS — R1033 Periumbilical pain: Secondary | ICD-10-CM

## 2017-08-11 DIAGNOSIS — R195 Other fecal abnormalities: Secondary | ICD-10-CM

## 2017-08-11 DIAGNOSIS — Z87891 Personal history of nicotine dependence: Secondary | ICD-10-CM | POA: Diagnosis not present

## 2017-08-11 DIAGNOSIS — I872 Venous insufficiency (chronic) (peripheral): Secondary | ICD-10-CM | POA: Diagnosis not present

## 2017-08-11 DIAGNOSIS — M1990 Unspecified osteoarthritis, unspecified site: Secondary | ICD-10-CM | POA: Diagnosis not present

## 2017-08-11 DIAGNOSIS — I739 Peripheral vascular disease, unspecified: Secondary | ICD-10-CM | POA: Insufficient documentation

## 2017-08-11 DIAGNOSIS — Z96641 Presence of right artificial hip joint: Secondary | ICD-10-CM | POA: Insufficient documentation

## 2017-08-11 DIAGNOSIS — Z79899 Other long term (current) drug therapy: Secondary | ICD-10-CM | POA: Diagnosis not present

## 2017-08-11 DIAGNOSIS — Z86711 Personal history of pulmonary embolism: Secondary | ICD-10-CM | POA: Insufficient documentation

## 2017-08-11 DIAGNOSIS — Z7901 Long term (current) use of anticoagulants: Secondary | ICD-10-CM | POA: Diagnosis not present

## 2017-08-11 DIAGNOSIS — J439 Emphysema, unspecified: Secondary | ICD-10-CM

## 2017-08-11 DIAGNOSIS — F419 Anxiety disorder, unspecified: Secondary | ICD-10-CM | POA: Insufficient documentation

## 2017-08-11 DIAGNOSIS — D649 Anemia, unspecified: Secondary | ICD-10-CM | POA: Diagnosis not present

## 2017-08-11 HISTORY — PX: COLONOSCOPY WITH PROPOFOL: SHX5780

## 2017-08-11 SURGERY — COLONOSCOPY WITH PROPOFOL
Anesthesia: Monitor Anesthesia Care

## 2017-08-11 MED ORDER — SODIUM CHLORIDE 0.9 % IV SOLN: INTRAVENOUS | Status: DC

## 2017-08-11 MED ORDER — PROPOFOL 10 MG/ML IV BOLUS
INTRAVENOUS | Status: DC | PRN
  Administered 2017-08-11: 40 mg via INTRAVENOUS
  Administered 2017-08-11: 80 mg via INTRAVENOUS
  Administered 2017-08-11: 20 mg via INTRAVENOUS

## 2017-08-11 MED ORDER — PROPOFOL 500 MG/50ML IV EMUL
INTRAVENOUS | Status: DC | PRN
  Administered 2017-08-11: 150 ug/kg/min via INTRAVENOUS

## 2017-08-11 MED ORDER — PROPOFOL 10 MG/ML IV BOLUS
INTRAVENOUS | Status: AC
  Filled 2017-08-11: qty 60

## 2017-08-11 MED ORDER — PROPOFOL 10 MG/ML IV BOLUS
INTRAVENOUS | Status: AC
  Filled 2017-08-11: qty 20

## 2017-08-11 MED ORDER — LIDOCAINE 2% (20 MG/ML) 5 ML SYRINGE
INTRAMUSCULAR | Status: DC | PRN
  Administered 2017-08-11: 100 mg via INTRAVENOUS

## 2017-08-11 MED ORDER — LACTATED RINGERS IV SOLN
INTRAVENOUS | Status: DC
  Administered 2017-08-11 (×2): via INTRAVENOUS

## 2017-08-11 SURGICAL SUPPLY — 22 items

## 2017-08-11 NOTE — Interval H&P Note (Signed)
History and Physical Interval Note:  08/11/2017 9:00 AM  [name]  has presented today for surgery, with the diagnosis of Periumbilical pain, constipation  The various methods of treatment have been discussed with the patient and family. After consideration of risks, benefits and other options for treatment, the patient has consented to  Procedure(s): COLONOSCOPY WITH PROPOFOL (N/A) as a surgical intervention .  The patient's history has been reviewed, patient examined, no change in status, stable for surgery.  I have reviewed the patient's chart and labs.  Questions were answered to the patient's satisfaction.     [name]

## 2017-08-11 NOTE — Anesthesia Postprocedure Evaluation (Signed)
Anesthesia Post Note  Patient: [name]  Procedure(s) Performed: COLONOSCOPY WITH PROPOFOL (N/A )     Patient location during evaluation: PACU Anesthesia Type: MAC Level of consciousness: awake and alert Pain management: pain level controlled Vital Signs Assessment: post-procedure vital signs reviewed and stable Respiratory status: spontaneous breathing, nonlabored ventilation, respiratory function stable and patient connected to nasal cannula oxygen Cardiovascular status: stable and blood pressure returned to baseline Postop Assessment: no apparent nausea or vomiting Anesthetic complications: no    Last Vitals:  Vitals:   08/11/17 0952 08/11/17 1001  BP: (!) 112/59 (!) 108/51  Pulse: 66 61  Resp: 10   Temp: 37 C   SpO2: 95% 93%    Last Pain:  Vitals:   08/11/17 0952  TempSrc: Oral                 Barnet Glasgow

## 2017-08-11 NOTE — H&P (Signed)
History:  This patient presents for endoscopic testing for positive cologuard.  [name] Referring physician: Binnie Rail, MD  Past Medical History: Past Medical History:  Diagnosis Date  . Anemia, unspecified   . Anxiety   . Arthritis    "about q joint i've got" (07/14/2016)  . Benign neoplasm of colon 12/2007   Hyperplastic colon polyps  . CAD (coronary artery disease)    minimal catheterization, October 2008  . Cellulitis   . Chest pain    with stress  . Childhood asthma   . Chronic low back pain   . Constipation    and diarrhea chronic..Dr. Alben Spittle  . Depression   . Diverticulosis of colon (without mention of hemorrhage)   . DVT (deep venous thrombosis) (HCC) 1990s   LLE  . Family history of adverse reaction to anesthesia    "daughter died having her 1st child cause she got too much anesthesia"   . GERD (gastroesophageal reflux disease)   . History of blood transfusion 01/2008   "related to hip OR"  . 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 Hypopotassemia   . Hypotension, unspecified   . Leg pain   . 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  . Methadone adverse reaction    for chronic leg and back pain  . Other pulmonary embolism and infarction 2008   Significant 2008 and coumadin therapy RV dysfunction...echo...2008..EF 50%..right ventricle markedly dilated w marked right ventricular dysfunc and moder tricuspid regurg/echo..March 2010, Ef 50%, mild dilation of right ventricule w mild decrease right ventric function   . Peripheral vascular disease ([place])   . Rectus sheath hematoma 07/13/2012   On coumadin   . Right bundle branch block    intermittent  . Thyroid disease   . Tobacco use disorder   . Urinary incontinence   . Venous insufficiency (chronic) (peripheral)    Patient's legs were wrapped 2013     Past Surgical History: Past Surgical History:  Procedure Laterality Date  .  CARDIAC CATHETERIZATION  05/2007   Archie Endo 12/08/2010  . CHOLECYSTECTOMY OPEN    . GASTRIC BYPASS  1980s  . JOINT REPLACEMENT    . KNEE ARTHROSCOPY Left 05/2001   Archie Endo 12/21/2010  . SHOULDER ARTHROSCOPY W/ ROTATOR CUFF REPAIR Right 08/2002   Archie Endo 12/21/2010  . TONSILLECTOMY    . TOTAL HIP ARTHROPLASTY Right 01/2008    Allergies: Allergies  Allergen Reactions  . Amitiza [Lubiprostone] Diarrhea and Nausea And Vomiting  . Morphine     REACTION: nausea, rash, itching, vomiting  . Narcan [Naloxone Hcl] Other (See Comments)    Pt was not her self  . Nsaids     Bowel irregularity  . Venlafaxine Other (See Comments)    Reaction unknown    Outpatient Meds: Current Facility-Administered Medications  Medication Dose Route Frequency Provider Last Rate Last Dose  . lactated ringers infusion   Intravenous Continuous [name], MD 10 mL/hr at 08/11/17 0834        ___________________________________________________________________ Objective   Exam:  BP 119/63   Temp 97.9 F (36.6 C) (Oral)   Resp 14   Ht 5\' 5"  (1.651 m)   Wt (!) 307 lb (139.3 kg)   SpO2 97%   BMI 51.09 kg/m    CV: RRR without murmur, S1/S2, no JVD, no peripheral edema  Resp: clear to auscultation bilaterally, normal RR and effort noted  GI: soft,  mild upper abdominal tenderness, with active bowel sounds. No guarding or palpable organomegaly noted.  Neuro: awake, alert and oriented x 3. Normal gross motor function and fluent speech   Assessment:  Positive cologuard  Plan:  colonoscopy   [name]

## 2017-08-11 NOTE — Discharge Instructions (Signed)
YOU HAD AN ENDOSCOPIC PROCEDURE TODAY: Refer to the procedure report and other information in the discharge instructions given to you for any specific questions about what was found during the examination. If this information does not answer your questions, please call [facility] office at [phone] to clarify.  ° °YOU SHOULD EXPECT: Some feelings of bloating in the abdomen. Passage of more gas than usual. Walking can help get rid of the air that was put into your GI tract during the procedure and reduce the bloating. If you had a lower endoscopy (such as a colonoscopy or flexible sigmoidoscopy) you may notice spotting of blood in your stool or on the toilet paper. Some abdominal soreness may be present for a day or two, also. ° °DIET: Your first meal following the procedure should be a light meal and then it is ok to progress to your normal diet. A half-sandwich or bowl of soup is an example of a good first meal. Heavy or fried foods are harder to digest and may make you feel nauseous or bloated. Drink plenty of fluids but you should avoid alcoholic beverages for 24 hours. If you had a esophageal dilation, please see attached instructions for diet.   ° °ACTIVITY: Your care partner should take you home directly after the procedure. You should plan to take it easy, moving slowly for the rest of the day. You can resume normal activity the day after the procedure however YOU SHOULD NOT DRIVE, use power tools, machinery or perform tasks that involve climbing or major physical exertion for 24 hours (because of the sedation medicines used during the test).  ° °SYMPTOMS TO REPORT IMMEDIATELY: °A gastroenterologist can be reached at any hour. Please call [phone]  for any of the following symptoms:  °Following lower endoscopy (colonoscopy, flexible sigmoidoscopy) °Excessive amounts of blood in the stool  °Significant tenderness, worsening of abdominal pains  °Swelling of the abdomen that is new, acute  °Fever of 100° or  higher  °Following upper endoscopy (EGD, EUS, ERCP, esophageal dilation) °Vomiting of blood or coffee ground material  °New, significant abdominal pain  °New, significant chest pain or pain under the shoulder blades  °Painful or persistently difficult swallowing  °New shortness of breath  °Black, tarry-looking or red, bloody stools ° °FOLLOW UP:  °If any biopsies were taken you will be contacted by phone or by letter within the next 1-3 weeks. Call [phone]  if you have not heard about the biopsies in 3 weeks.  °Please also call with any specific questions about appointments or follow up tests. ° °

## 2017-08-11 NOTE — Op Note (Signed)
[facility] Patient Name: [name] Procedure Date: 08/11/2017 MRN: [id number] Attending MD: Starr Lake. Myrtie Neither , MD Date of Birth: 30-Jun-1956 CSN: [id number] Age: 62 Admit Type: Outpatient Procedure:                Colonoscopy Indications:              Positive Cologuard test Providers:                Sherilyn Cooter L. Myrtie Neither, MD, Bonney Leitz, Kandice Robinsons, Technician Referring MD:             Cheryll Cockayne, MD Medicines:                Monitored Anesthesia Care Complications:            No immediate complications. Estimated Blood Loss:     Estimated blood loss was minimal. Procedure:                Pre-Anesthesia Assessment:                           - Prior to the procedure, a History and Physical                            was performed, and patient medications and                            allergies were reviewed. The patient's tolerance of                            previous anesthesia was also reviewed. The risks                            and benefits of the procedure and the sedation                            options and risks were discussed with the patient.                            All questions were answered, and informed consent                            was obtained. Prior Anticoagulants: The patient                            last took Coumadin (warfarin) 4 days and Eliquis                            (apixaban - apparently used for bridging) 2 days                            prior to the procedure. ASA Grade Assessment: III -  A patient with severe systemic disease. After                            reviewing the risks and benefits, the patient was                            deemed in satisfactory condition to undergo the                            procedure.                           After obtaining informed consent, the colonoscope                            was passed under direct vision. Throughout  the                            procedure, the patient's blood pressure, pulse, and                            oxygen saturations were monitored continuously. The                            EC-[id number] ([id number]) scope was introduced through                            the anus and advanced to the the cecum, identified                            by appendiceal orifice and ileocecal valve. The                            colonoscopy was performed without difficulty. The                            patient tolerated the procedure well. The quality                            of the bowel preparation was good. The ileocecal                            valve, appendiceal orifice, and rectum were                            photographed. The quality of the bowel preparation                            was evaluated using the BBPS [facility] Bowel                            Preparation Scale) with scores of: Right Colon = 2,  Transverse Colon = 2 and Left Colon = 2. The total                            BBPS score equals 6. After lavage. The bowel                            preparation used was SUPREP. Scope In: 9:19:49 AM Scope Out: 9:43:32 AM Scope Withdrawal Time: 0 hours 15 minutes 21 seconds  Total Procedure Duration: 0 hours 23 minutes 43 seconds  Findings:      The digital rectal exam findings include decreased sphincter tone.      A 2 mm polyp was found in the hepatic flexure. The polyp was sessile.       The polyp was removed with a cold biopsy forceps. Resection and       retrieval were complete.      A 5 mm polyp was found in the proximal transverse colon. The polyp was       sessile. The polyp was removed with a cold snare. Resection and       retrieval were complete. To prevent bleeding post-intervention, one       hemostatic clip was successfully placed (MR conditional). There was no       bleeding at the end of the procedure.      A 5 mm polyp was found in the distal  transverse colon. The polyp was       sessile. The polyp was removed with a cold snare. Resection and       retrieval were complete. To prevent bleeding post-intervention, one       hemostatic clip was successfully placed (MR conditional). There was no       bleeding at the end of the procedure.      Multiple small-mouthed diverticula were found in the left colon.      Retroflexion in the rectum was not performed due to anatomy.      The exam was otherwise without abnormality. Impression:               - Decreased sphincter tone found on digital rectal                            exam.                           - One 2 mm polyp at the hepatic flexure, removed                            with a cold biopsy forceps. Resected and retrieved.                           - One 5 mm polyp in the proximal transverse colon,                            removed with a cold snare. Resected and retrieved.                            Clip (MR conditional) was placed.                           -  One 5 mm polyp in the distal transverse colon,                            removed with a cold snare. Resected and retrieved.                            Clip (MR conditional) was placed.                           - Diverticulosis in the left colon.                           - The examination was otherwise normal. Moderate Sedation:      MAC sedation used Recommendation:           - Patient has a contact number available for                            emergencies. The signs and symptoms of potential                            delayed complications were discussed with the                            patient. Return to normal activities tomorrow.                            Written discharge instructions were provided to the                            patient.                           - Resume previous diet.                           - Resume Coumadin (warfarin) today and Xarelto                            (rivaroxaban)  tomorrow at prior doses. Refer to                            primary physician for further adjustment of therapy.                           - Await pathology results.                           - Repeat colonoscopy is recommended for                            surveillance. The colonoscopy date will be                            determined after pathology results from today's  exam become available for review. Procedure Code(s):        --- Professional ---                           [phone], Colonoscopy, flexible; with removal of                            tumor(s), polyp(s), or other lesion(s) by snare                            technique                           45380, 59, Colonoscopy, flexible; with biopsy,                            single or multiple Diagnosis Code(s):        --- Professional ---                           K62.89, Other specified diseases of anus and rectum                           D12.3, Benign neoplasm of transverse colon (hepatic                            flexure or splenic flexure)                           R19.5, Other fecal abnormalities                           K57.30, Diverticulosis of large intestine without                            perforation or abscess without bleeding CPT copyright 2016 American Medical Association. All rights reserved. The codes documented in this report are preliminary and upon coder review may  be revised to meet current compliance requirements. Hovanes Hymas L. Myrtie Neither, MD 08/11/2017 9:52:13 AM This report has been signed electronically. Number of Addenda: 0

## 2017-08-11 NOTE — Anesthesia Preprocedure Evaluation (Signed)
Anesthesia Evaluation  Patient identified by MRN, date of birth, ID band Patient awake    Reviewed: Allergy & Precautions, H&P , Patient's Chart, lab work & pertinent test results, reviewed documented beta blocker date and time   Airway Mallampati: III  TM Distance: >3 FB Neck ROM: full    Dental no notable dental hx.    Pulmonary former smoker,    Pulmonary exam normal breath sounds clear to auscultation       Cardiovascular  Rhythm:regular Rate:Normal     Neuro/Psych    GI/Hepatic   Endo/Other    Renal/GU      Musculoskeletal   Abdominal   Peds  Hematology   Anesthesia Other Findings   Reproductive/Obstetrics                             Anesthesia Physical Anesthesia Plan  ASA: III  Anesthesia Plan: MAC   Post-op Pain Management:    Induction: Intravenous  PONV Risk Score and Plan:   Airway Management Planned: Mask and Natural Airway  Additional Equipment:   Intra-op Plan:   Post-operative Plan:   Informed Consent: I have reviewed the patients History and Physical, chart, labs and discussed the procedure including the risks, benefits and alternatives for the proposed anesthesia with the patient or authorized representative who has indicated his/her understanding and acceptance.   Dental Advisory Given  Plan Discussed with: CRNA and Surgeon  Anesthesia Plan Comments:         Anesthesia Quick Evaluation

## 2017-08-11 NOTE — Transfer of Care (Signed)
Immediate Anesthesia Transfer of Care Note  Patient: [name]  Procedure(s) Performed: COLONOSCOPY WITH PROPOFOL (N/A )  Patient Location: PACU and Endoscopy Unit  Anesthesia Type:MAC  Level of Consciousness: awake, alert  and oriented  Airway & Oxygen Therapy: Patient Spontanous Breathing  Post-op Assessment: Report given to RN and Post -op Vital signs reviewed and stable  Post vital signs: Reviewed and stable  Last Vitals:  Vitals:   08/11/17 0829  BP: 119/63  Resp: 14  Temp: 36.6 C  SpO2: 97%    Last Pain:  Vitals:   08/11/17 0829  TempSrc: Oral         Complications: No apparent anesthesia complications

## 2017-08-14 ENCOUNTER — Encounter (HOSPITAL_COMMUNITY): Payer: Self-pay | Admitting: Gastroenterology

## 2017-08-14 ENCOUNTER — Encounter: Payer: Self-pay | Admitting: Gastroenterology

## 2017-08-14 ENCOUNTER — Telehealth: Payer: Self-pay | Admitting: Gastroenterology

## 2017-08-14 NOTE — Telephone Encounter (Signed)
Spoke to patient, she had a sore throat Saturday and Sunday, it is resolved today. Let her know that this is not uncommon, due to oxygen she was receiving. Patient will call back if she has questions or concerns.

## 2017-08-15 ENCOUNTER — Other Ambulatory Visit: Payer: Self-pay | Admitting: Internal Medicine

## 2017-08-15 ENCOUNTER — Ambulatory Visit: Payer: Medicare Other

## 2017-09-12 ENCOUNTER — Encounter: Payer: Self-pay | Admitting: Internal Medicine

## 2017-09-25 ENCOUNTER — Encounter: Payer: Self-pay | Admitting: Gastroenterology

## 2017-09-29 ENCOUNTER — Ambulatory Visit (INDEPENDENT_AMBULATORY_CARE_PROVIDER_SITE_OTHER): Payer: Medicare Other | Admitting: General Practice

## 2017-09-29 DIAGNOSIS — Z86711 Personal history of pulmonary embolism: Secondary | ICD-10-CM | POA: Diagnosis not present

## 2017-09-29 DIAGNOSIS — Z7901 Long term (current) use of anticoagulants: Secondary | ICD-10-CM | POA: Diagnosis not present

## 2017-09-29 LAB — POCT INR: INR: 6.7

## 2017-09-29 NOTE — Progress Notes (Signed)
Agree with management.  Zayleigh Stroh J Ludie Pavlik, MD  

## 2017-09-29 NOTE — Patient Instructions (Addendum)
Pre visit review using our clinic review tool, if applicable. No additional management support is needed unless otherwise documented below in the visit note.  Stop coumadin and re-check on Tuesday.  Go to ER if any unusual bleeding occurs.  Re-check on Tuesday, 2/25.

## 2017-10-03 ENCOUNTER — Ambulatory Visit (INDEPENDENT_AMBULATORY_CARE_PROVIDER_SITE_OTHER): Payer: Medicare Other | Admitting: General Practice

## 2017-10-03 DIAGNOSIS — Z86711 Personal history of pulmonary embolism: Secondary | ICD-10-CM | POA: Diagnosis not present

## 2017-10-03 DIAGNOSIS — Z7901 Long term (current) use of anticoagulants: Secondary | ICD-10-CM

## 2017-10-03 LAB — POCT INR: INR: 1.4

## 2017-10-03 NOTE — Patient Instructions (Addendum)
Pre visit review using our clinic review tool, if applicable. No additional management support is needed unless otherwise documented below in the visit note.   Today, take 10 mg (2 tablets) and then tomorrow, Wednesday continue to take 1 tablet daily except 2 tablets on Monday, Wednesdays and Fridays.  Re-check in 1 week.

## 2017-10-05 NOTE — Progress Notes (Signed)
Agree with management.  Annalaya Wile J Adilee Lemme, MD  

## 2017-10-09 ENCOUNTER — Other Ambulatory Visit: Payer: Self-pay

## 2017-10-09 ENCOUNTER — Encounter: Payer: Self-pay | Admitting: Vascular Surgery

## 2017-10-09 ENCOUNTER — Ambulatory Visit (INDEPENDENT_AMBULATORY_CARE_PROVIDER_SITE_OTHER): Payer: Medicare Other | Admitting: Vascular Surgery

## 2017-10-09 VITALS — BP 118/71 | HR 64 | Temp 97.5°F | Resp 20 | Ht 65.0 in | Wt 320.0 lb

## 2017-10-09 DIAGNOSIS — M25562 Pain in left knee: Secondary | ICD-10-CM

## 2017-10-09 DIAGNOSIS — R600 Localized edema: Secondary | ICD-10-CM

## 2017-10-09 DIAGNOSIS — M25561 Pain in right knee: Secondary | ICD-10-CM

## 2017-10-09 DIAGNOSIS — I872 Venous insufficiency (chronic) (peripheral): Secondary | ICD-10-CM | POA: Diagnosis not present

## 2017-10-09 NOTE — Progress Notes (Addendum)
Established Venous Insufficiency   History of Present Illness   [name] is a 62 y.o. (May 04, 1956) female who presents with chief complaint: bilateral leg swelling.  She has been followed for chronic bilateral lower extremity edema over the past several years.  She has had on and off ulcerations to bilateral lower legs during this time.  Venous reflux study of right lower extremity 2 years ago demonstrated incompetent deep as well as superficial systems in multiple segments.  She is noncompliant with compression use and relies mostly on leg elevation to alleviate symptoms.  If she has been on her feet for several hours she may then use an Ace wrap as a form of compression.  Patient states she recently was experiencing itchiness of bilateral shins and used a washcloth to scratch which was opened numerous small pinpoint sores.  She complains of concurrent and persistent leg pain with occasional numbness.  She is also followed by neurosurgery for her neuropathic symptoms.  She is on chronic Coumadin therapy due to history of PE.  She is ambulatory with a walker.  The patient's PMH, PSH, SH, and FamHx are unchanged from prior office visit.  Current Outpatient Medications  Medication Sig Dispense Refill  . bisacodyl (BISACODYL LAXATIVE) 5 MG EC tablet Take 30 mg by mouth daily as needed for moderate constipation.    . calcium carbonate (TUMS - DOSED IN MG ELEMENTAL CALCIUM) 500 MG chewable tablet Chew 1 tablet by mouth as needed for indigestion or heartburn.    Marland Kitchen HYDROmorphone (DILAUDID) 4 MG tablet TAKE 1 TO 2 TABLETS BY MOUTH EVERY 6 HOURS AS NEEDED FOR BREAKTHROUGH PAIN  0  . hyoscyamine (LEVSIN SL) 0.125 MG SL tablet Place 1 tablet (0.125 mg total) under the tongue as directed. Take 1-2 daily as needed. 30 tablet 0  . levothyroxine (SYNTHROID) 200 MCG tablet Take 1 tablet (200 mcg total) by mouth daily before breakfast. 90 tablet 1  . methadone (DOLOPHINE) 10 MG tablet Take 10 mg by mouth  every 8 (eight) hours as needed. for pain  0  . warfarin (COUMADIN) 5 MG tablet TAKE AS DIRECTED BY ANTICOAGULATION CLINIC 120 tablet 0   No current facility-administered medications for this visit.     Allergies  Allergen Reactions  . Amitiza [Lubiprostone] Diarrhea and Nausea And Vomiting  . Morphine     REACTION: nausea, rash, itching, vomiting  . Narcan [Naloxone Hcl] Other (See Comments)    Pt was not her self  . Nsaids     Bowel irregularity  . Venlafaxine Other (See Comments)    Reaction unknown    On ROS today: Leg pain   Physical Examination   Vitals:   10/09/17 1358  BP: 118/71  Pulse: 64  Resp: 20  Temp: (!) 97.5 F (36.4 C)  TempSrc: Oral  SpO2: 94%  Weight: (!) 320 lb (145.2 kg)  Height: 5\' 5"  (1.651 m)   Body mass index is 53.25 kg/m.  General Alert, O x 3, Obese, NAD  Pulmonary Sym exp, good B air movt,   Cardiac RRR, Nl S1, S2,   Vascular Vessel Right Left  Radial Palpable Palpable  Brachial Palpable Palpable  Carotid Palpable, No Bruit Palpable, No Bruit  Aorta Not palpable N/A          PT Not palpable Not palpable  DP Not palpable Not palpable    Gastro- intestinal soft, non-distended, non-tender to palpation,   Musculo- skeletal M/S 5/5 throughout  , Extremities without ischemic  changes; bilateral lower extremity edema, multiple pinpoint small eschar bilateral shins without active drainage or weeping; stasis pigmentation changes bilateral shins; diminished pedal pulses bilaterally    Neurologic Cranial nerves 2-12 intact , Pain and light touch intact in extremities , Motor exam as listed above     Medical Decision Making   [name] is a 62 y.o. female who presents with: Bilateral lower extremity chronic venous insufficiency   No ulcerations noted on exam; multiple pinpoint eschars likely will heal with compression and elevation  Recommended daily compression with Ace wrap to the level of the knee bilaterally; wraps were  applied in office  Follow up in 1 month to recheck BLE  We will also check ABIs at that time due to leg pain and diminished pedal pulses   Dagoberto Ligas, PA-C [facility] [place] Office: [phone]   Exam details as above.  Patient does not have palpable pedal pulses.  Ulcerations are very small 2 mm diameter less than 1 mm depth.  We will get her back in Ace wraps with some compression.  This has healed her ulcers in the past.  She does have a known history of reflux in the superficial and deep system in both legs.  She will return in 1 month.  We would will need to consider still whether or not laser ablation would be in her best interest.  She overall is fairly debilitated.  In addition since she does not have palpable pedal pulses then we will obtain bilateral ABIs when she returns in 1 month.  Ruta Hinds, MD [facility] Office: [phone] Pager: [phone]

## 2017-10-09 NOTE — Progress Notes (Signed)
Subjective:    Patient ID: [name], female    DOB: [birth year], 62 y.o.   MRN: [id number]  HPI The patient is here for follow up.  Chronic venous insufficiency:  She saw Dr Oneida Alar yesterday.  He advised wrapping the legs instead of the compression socks.  It would be easier for her wrap them.   She continues to have b/l leg edema.    She has chronic leg pain and is not sure what the cause is:  Dr Oneida Alar thinks some of the pain is vascular, maybe 20%.  He thinks the rest may be neurosurgical/orthopedic. She does follow with neurosurgery for pain management.  He has told her she is not eligible for surgery.  She has degenerative changes in her back. Her legs are very heavy some days.  The legs shake when she gets up and they do not want to go.  Her legs feel like they will give out. She only wakes where she has to go.  She walks with a walker, but is not very active.    Hypothyroidism:  She is taking her medication daily.  She denies any recent changes in energy - her energy level is low.  She has gained weight and is not sure why.  Her thyroid medication was adjusted last fall.   H/o PE on chronic anticoagulation:  She is following with Jenny Reichmann and her wafarin level was good the last time it was checked  Recurrent falls, deconditioning, chronic back and leg pain:  She does not drive.  She has a friend or takes uber to appointments.  She uses a walker when she leaves the house, but furniture surfs at home.  She has fallen and is afraid of falling.  She is afraid to go out and is not very active.  She has chronic back and leg pain and her pain feels weak.  She does not drive and can not get to PT, but would be willing to try it to see if it helped her pain and helped her become stronger.    Numbness/tingling in hands;  She has numbness and tingling in her hands and wonders about vitamin B 12 level.    Medications and allergies reviewed with patient and updated if appropriate.  Patient  Active Problem List   Diagnosis Date Noted  . Positive colorectal cancer screening using Cologuard test   . Right knee pain 11/18/2016  . Female bladder prolapse 10/10/2016  . Long term current use of anticoagulant 10/10/2016  . Falls frequently 07/14/2016  . Syncope 07/14/2016  . Generalized weakness   . Hyperlipidemia 06/10/2014  . Severe obesity (BMI >= 40) ([place]) 06/10/2014  . CAD (coronary artery disease)   . Interstitial cystitis 01/31/2013  . Chronic narcotic dependence ([place]) 01/30/2013  . S/P hip replacement 11/01/2012  . Venous insufficiency (chronic) (peripheral)   . Urinary frequency 09/22/2011  . Atherosclerosis of native arteries of the extremities with ulceration ([place]) 07/21/2011  . Esophageal spasm 06/14/2011  . Visual floaters 05/31/2011  . Neuropathic pain of lower extremity 05/31/2011  . Constipation 05/12/2011  . Hypothyroidism 01/19/2010  . LEG CRAMPS, NOCTURNAL 01/18/2010  . Bilateral leg edema 01/18/2010  . ANEMIA, NORMOCYTIC 08/10/2007  . History of pulmonary embolism 08/10/2007  . 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 LOW BACK PAIN, CHRONIC 08/10/2007  . MEDIASTINAL ADENOPATHY CASTLEMANS D 08/10/2007    Current Outpatient Medications on File Prior to Visit  Medication Sig Dispense Refill  . bisacodyl (BISACODYL LAXATIVE)  5 MG EC tablet Take 30 mg by mouth daily as needed for moderate constipation.    . calcium carbonate (TUMS - DOSED IN MG ELEMENTAL CALCIUM) 500 MG chewable tablet Chew 1 tablet by mouth as needed for indigestion or heartburn.    Marland Kitchen HYDROmorphone (DILAUDID) 4 MG tablet TAKE 1 TO 2 TABLETS BY MOUTH EVERY 6 HOURS AS NEEDED FOR BREAKTHROUGH PAIN  0  . hyoscyamine (LEVSIN SL) 0.125 MG SL tablet Place 1 tablet (0.125 mg total) under the tongue as directed. Take 1-2 daily as needed. 30 tablet 0  . levothyroxine (SYNTHROID) 200 MCG tablet Take 1 tablet (200 mcg total) by mouth daily before breakfast. 90 tablet 1  . methadone (DOLOPHINE) 10 MG  tablet Take 10 mg by mouth every 8 (eight) hours as needed. for pain  0  . warfarin (COUMADIN) 5 MG tablet TAKE AS DIRECTED BY ANTICOAGULATION CLINIC 120 tablet 0   No current facility-administered medications on file prior to visit.     Past Medical History:  Diagnosis Date  . Anemia, unspecified   . Anxiety   . Arthritis    "about q joint i've got" (07/14/2016)  . Benign neoplasm of colon 12/2007   Hyperplastic colon polyps  . CAD (coronary artery disease)    minimal catheterization, October 2008  . Cellulitis   . Chest pain    with stress  . Childhood asthma   . Chronic low back pain   . Constipation    and diarrhea chronic..Dr. Alben Spittle  . Depression   . Diverticulosis of colon (without mention of hemorrhage)   . DVT (deep venous thrombosis) (HCC) 1990s   LLE  . Family history of adverse reaction to anesthesia    "daughter died having her 1st child cause she got too much anesthesia"   . GERD (gastroesophageal reflux disease)   . History of blood transfusion 01/2008   "related to hip OR"  . 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 Hypopotassemia   . Hypotension, unspecified   . Leg pain   . 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  . Methadone adverse reaction    for chronic leg and back pain  . Other pulmonary embolism and infarction 2008   Significant 2008 and coumadin therapy RV dysfunction...echo...2008..EF 50%..right ventricle markedly dilated w marked right ventricular dysfunc and moder tricuspid regurg/echo..March 2010, Ef 50%, mild dilation of right ventricule w mild decrease right ventric function   . Peripheral vascular disease ([place])   . Rectus sheath hematoma 07/13/2012   On coumadin   . Right bundle branch block    intermittent  . Thyroid disease   . Tobacco use disorder   . Urinary incontinence   . Venous insufficiency (chronic) (peripheral)    Patient's legs were wrapped 2013    Past  Surgical History:  Procedure Laterality Date  . CARDIAC CATHETERIZATION  05/2007   Archie Endo 12/08/2010  . CHOLECYSTECTOMY OPEN    . COLONOSCOPY WITH PROPOFOL N/A 08/11/2017   Procedure: COLONOSCOPY WITH PROPOFOL;  Surgeon: Doran Stabler, MD;  Location: [facility];  Service: Gastroenterology;  Laterality: N/A;  . GASTRIC BYPASS  1980s  . JOINT REPLACEMENT    . KNEE ARTHROSCOPY Left 05/2001   Archie Endo 12/21/2010  . SHOULDER ARTHROSCOPY W/ ROTATOR CUFF REPAIR Right 08/2002   Archie Endo 12/21/2010  . TONSILLECTOMY    . TOTAL HIP ARTHROPLASTY Right 01/2008    Social History  Socioeconomic History  . Marital status: Divorced    Spouse name: None  . Number of children: 1  . Years of education: None  . Highest education level: None  Social Needs  . Financial resource strain: None  . Food insecurity - worry: None  . Food insecurity - inability: None  . Transportation needs - medical: None  . Transportation needs - non-medical: None  Occupational History  . Occupation: Disabled    Employer: UNEMPLOYED  Tobacco Use  . Smoking status: Former Smoker    Packs/day: 0.10    Years: 27.00    Pack years: 2.70    Types: Cigarettes    Last attempt to quit: 06/09/2011    Years since quitting: 6.3  . Smokeless tobacco: Never Used  Substance and Sexual Activity  . Alcohol use: No    Alcohol/week: 0.0 oz    Comment: 07/14/2016 "nothing since the early 1990s"  . Drug use: No  . Sexual activity: None  Other Topics Concern  . None  Social History Narrative   Current smoker wi last 12 mos.     Family History  Problem Relation Age of Onset  . Heart disease Mother   . Heart disease Father   . Crohn's disease Sister     Review of Systems  Constitutional: Negative for chills and fever.  Respiratory: Negative for cough, shortness of breath and wheezing.   Cardiovascular: Negative for chest pain, palpitations and leg swelling.  Gastrointestinal: Positive for abdominal pain (lower abdominal  pain).  Genitourinary: Negative for dysuria and hematuria.  Musculoskeletal: Positive for back pain.  Neurological: Negative for light-headedness and headaches.       Objective:   Vitals:   10/10/17 1349  BP: 130/72  Pulse: 66  Resp: 16  Temp: 97.9 F (36.6 C)  SpO2: 95%   Wt Readings from Last 3 Encounters:  10/10/17 (!) 318 lb (144.2 kg)  10/09/17 (!) 320 lb (145.2 kg)  08/11/17 (!) 307 lb (139.3 kg)   Body mass index is 52.92 kg/m.   Physical Exam    Constitutional: Appears well-developed and well-nourished. No distress.  HENT:  Head: Normocephalic and atraumatic.  Neck: Neck supple. No tracheal deviation present. No thyromegaly present.  No cervical lymphadenopathy Cardiovascular: Normal rate, regular rhythm and normal heart sounds.   No murmur heard. No carotid bruit .  Moderate b/l LE edema - both curently wrapped Pulmonary/Chest: Effort normal and breath sounds normal. No respiratory distress. No has no wheezes. No rales.  Skin: Skin is warm and dry. Not diaphoretic.  Psychiatric: Normal mood and affect. Behavior is normal.      Assessment & Plan:    See Problem List for Assessment and Plan of chronic medical problems.

## 2017-10-10 ENCOUNTER — Encounter: Payer: Self-pay | Admitting: Internal Medicine

## 2017-10-10 ENCOUNTER — Other Ambulatory Visit (INDEPENDENT_AMBULATORY_CARE_PROVIDER_SITE_OTHER): Payer: Medicare Other

## 2017-10-10 ENCOUNTER — Ambulatory Visit (INDEPENDENT_AMBULATORY_CARE_PROVIDER_SITE_OTHER): Payer: Medicare Other | Admitting: General Practice

## 2017-10-10 ENCOUNTER — Ambulatory Visit (INDEPENDENT_AMBULATORY_CARE_PROVIDER_SITE_OTHER): Payer: Medicare Other | Admitting: Internal Medicine

## 2017-10-10 VITALS — BP 130/72 | HR 66 | Temp 97.9°F | Resp 16 | Wt 318.0 lb

## 2017-10-10 DIAGNOSIS — R531 Weakness: Secondary | ICD-10-CM

## 2017-10-10 DIAGNOSIS — R739 Hyperglycemia, unspecified: Secondary | ICD-10-CM | POA: Diagnosis not present

## 2017-10-10 DIAGNOSIS — R296 Repeated falls: Secondary | ICD-10-CM | POA: Diagnosis not present

## 2017-10-10 DIAGNOSIS — E038 Other specified hypothyroidism: Secondary | ICD-10-CM | POA: Diagnosis not present

## 2017-10-10 DIAGNOSIS — R200 Anesthesia of skin: Secondary | ICD-10-CM | POA: Insufficient documentation

## 2017-10-10 DIAGNOSIS — R1030 Lower abdominal pain, unspecified: Secondary | ICD-10-CM | POA: Insufficient documentation

## 2017-10-10 DIAGNOSIS — M545 Low back pain: Secondary | ICD-10-CM | POA: Diagnosis not present

## 2017-10-10 DIAGNOSIS — R600 Localized edema: Secondary | ICD-10-CM | POA: Diagnosis not present

## 2017-10-10 DIAGNOSIS — Z7901 Long term (current) use of anticoagulants: Secondary | ICD-10-CM

## 2017-10-10 DIAGNOSIS — Z114 Encounter for screening for human immunodeficiency virus [HIV]: Secondary | ICD-10-CM

## 2017-10-10 DIAGNOSIS — M792 Neuralgia and neuritis, unspecified: Secondary | ICD-10-CM

## 2017-10-10 DIAGNOSIS — L989 Disorder of the skin and subcutaneous tissue, unspecified: Secondary | ICD-10-CM

## 2017-10-10 DIAGNOSIS — R202 Paresthesia of skin: Secondary | ICD-10-CM

## 2017-10-10 DIAGNOSIS — Z86711 Personal history of pulmonary embolism: Secondary | ICD-10-CM | POA: Diagnosis not present

## 2017-10-10 DIAGNOSIS — R7303 Prediabetes: Secondary | ICD-10-CM | POA: Insufficient documentation

## 2017-10-10 DIAGNOSIS — G5790 Unspecified mononeuropathy of unspecified lower limb: Secondary | ICD-10-CM

## 2017-10-10 DIAGNOSIS — G8929 Other chronic pain: Secondary | ICD-10-CM

## 2017-10-10 LAB — CBC WITH DIFFERENTIAL/PLATELET
BASOS PCT: 0.7 % (ref 0.0–3.0)
Basophils Absolute: 0 10*3/uL (ref 0.0–0.1)
EOS PCT: 1.6 % (ref 0.0–5.0)
Eosinophils Absolute: 0.1 10*3/uL (ref 0.0–0.7)
HCT: 36.7 % (ref 36.0–46.0)
HEMOGLOBIN: 12.1 g/dL (ref 12.0–15.0)
LYMPHS ABS: 0.9 10*3/uL (ref 0.7–4.0)
Lymphocytes Relative: 25.4 % (ref 12.0–46.0)
MCHC: 33 g/dL (ref 30.0–36.0)
MCV: 83.5 fl (ref 78.0–100.0)
MONO ABS: 0.3 10*3/uL (ref 0.1–1.0)
Monocytes Relative: 8 % (ref 3.0–12.0)
Neutro Abs: 2.3 10*3/uL (ref 1.4–7.7)
Neutrophils Relative %: 64.3 % (ref 43.0–77.0)
Platelets: 190 10*3/uL (ref 150.0–400.0)
RBC: 4.39 Mil/uL (ref 3.87–5.11)
RDW: 15.7 % — AB (ref 11.5–15.5)
WBC: 3.6 10*3/uL — AB (ref 4.0–10.5)

## 2017-10-10 LAB — COMPREHENSIVE METABOLIC PANEL
ALT: 9 U/L (ref 0–35)
AST: 20 U/L (ref 0–37)
Albumin: 3.5 g/dL (ref 3.5–5.2)
Alkaline Phosphatase: 81 U/L (ref 39–117)
BUN: 8 mg/dL (ref 6–23)
CHLORIDE: 100 meq/L (ref 96–112)
CO2: 33 meq/L — AB (ref 19–32)
Calcium: 9.2 mg/dL (ref 8.4–10.5)
Creatinine, Ser: 0.68 mg/dL (ref 0.40–1.20)
GFR: 92.64 mL/min (ref >60.00)
GLUCOSE: 81 mg/dL (ref 70–99)
POTASSIUM: 3.5 meq/L (ref 3.5–5.1)
SODIUM: 137 meq/L (ref 135–145)
Total Bilirubin: 0.5 mg/dL (ref 0.2–1.2)
Total Protein: 7.8 g/dL (ref 6.0–8.3)

## 2017-10-10 LAB — POCT INR: INR: 2.8

## 2017-10-10 LAB — TSH: TSH: 5.64 u[IU]/mL — ABNORMAL HIGH (ref 0.35–4.50)

## 2017-10-10 LAB — URINALYSIS, ROUTINE W REFLEX MICROSCOPIC
BILIRUBIN URINE: NEGATIVE
KETONES UR: NEGATIVE
NITRITE: NEGATIVE
PH: 7.5 (ref 5.0–8.0)
Specific Gravity, Urine: 1.01 (ref 1.000–1.030)
Total Protein, Urine: NEGATIVE
UROBILINOGEN UA: 0.2 (ref 0.0–1.0)
Urine Glucose: NEGATIVE

## 2017-10-10 LAB — VITAMIN B12: Vitamin B-12: 146 pg/mL — ABNORMAL LOW (ref 211–911)

## 2017-10-10 LAB — HEMOGLOBIN A1C: HEMOGLOBIN A1C: 5.5 % (ref 4.6–6.5)

## 2017-10-10 NOTE — Assessment & Plan Note (Signed)
H/o PE On long term anticoagulation Continue warfarin

## 2017-10-10 NOTE — Assessment & Plan Note (Signed)
Following with vascular surgery Will wrap her legs daily, which will likely be more effective and she will be more compliant with then compression socks

## 2017-10-10 NOTE — Assessment & Plan Note (Signed)
Will order home physical therapy-unable to go to physical therapy Frequent falls likely multi-factorial from obesity, deconditioning/generalized weakness, chronic back and leg pain

## 2017-10-10 NOTE — Patient Instructions (Addendum)
  Test(s) ordered today. Your results will be released to [place] (or called to you) after review, usually within 72hours after test completion. If any changes need to be made, you will be notified at that same time.  All other Health Maintenance issues reviewed.   All recommended immunizations and age-appropriate screenings are up-to-date or discussed.  No immunizations administered today.   Medications reviewed and updated.  Changes include  /  No changes recommended at this time.   A referral was ordered for home physical therapy  Please followup in 6 months

## 2017-10-10 NOTE — Assessment & Plan Note (Signed)
Deconditioning, sedentary Will start PT - needs to be home PT due to her not driving and she is unsafe to leave home without her walker and is not able to walk very far

## 2017-10-10 NOTE — Patient Instructions (Addendum)
Pre visit review using our clinic review tool, if applicable. No additional management support is needed unless otherwise documented below in the visit note.  Continue to take 1 tablet daily except 2 tablets on Monday, Wednesdays and Fridays.  Re-check in 4 weeks

## 2017-10-10 NOTE — Addendum Note (Signed)
Addended by: Lianne Cure A on: 10/10/2017 10:17 AM   Modules accepted: Orders

## 2017-10-10 NOTE — Assessment & Plan Note (Signed)
In hands, check B12 level

## 2017-10-10 NOTE — Assessment & Plan Note (Signed)
Has been experiencing some lower abdominal discomfort/pain Concern whether she may have a UTI We will check urinalysis, urine culture-no other obvious UTI symptoms

## 2017-10-10 NOTE — Assessment & Plan Note (Signed)
Following with neurosurgery for pain management Pain management per above

## 2017-10-10 NOTE — Assessment & Plan Note (Signed)
Will check a1c

## 2017-10-10 NOTE — Assessment & Plan Note (Signed)
Check tsh  Titrate med dose if needed  

## 2017-10-10 NOTE — Assessment & Plan Note (Signed)
Monitored by Jenny Reichmann -  Has check today Continue long term anticoagulation

## 2017-10-11 LAB — URINE CULTURE
MICRO NUMBER:: 90281700
RESULT: NO GROWTH
SPECIMEN QUALITY:: ADEQUATE

## 2017-10-11 LAB — HIV ANTIBODY (ROUTINE TESTING W REFLEX): HIV 1&2 Ab, 4th Generation: NONREACTIVE

## 2017-10-12 ENCOUNTER — Encounter: Payer: Self-pay | Admitting: Internal Medicine

## 2017-10-12 ENCOUNTER — Telehealth: Payer: Self-pay | Admitting: Internal Medicine

## 2017-10-12 DIAGNOSIS — R3129 Other microscopic hematuria: Secondary | ICD-10-CM | POA: Insufficient documentation

## 2017-10-12 DIAGNOSIS — E538 Deficiency of other specified B group vitamins: Secondary | ICD-10-CM | POA: Insufficient documentation

## 2017-10-12 NOTE — Telephone Encounter (Signed)
Copied from [place]. Topic: Quick Communication - See Telephone Encounter >> Oct 12, 2017  3:38 PM Synthia Innocent wrote: CRM for notification. See Telephone encounter for:  Needing Verbal order to start PT w/ [facility] on 10/16/17. Any other services needed? 10/12/17.

## 2017-10-12 NOTE — Progress Notes (Signed)
Agree with management.  Nyia Tsao J Lehman Whiteley, MD  

## 2017-10-12 NOTE — Telephone Encounter (Signed)
ok 

## 2017-10-13 ENCOUNTER — Other Ambulatory Visit: Payer: Self-pay | Admitting: Internal Medicine

## 2017-10-13 ENCOUNTER — Other Ambulatory Visit: Payer: Self-pay | Admitting: Emergency Medicine

## 2017-10-13 ENCOUNTER — Telehealth: Payer: Self-pay | Admitting: Emergency Medicine

## 2017-10-13 DIAGNOSIS — R3129 Other microscopic hematuria: Secondary | ICD-10-CM

## 2017-10-13 DIAGNOSIS — E039 Hypothyroidism, unspecified: Secondary | ICD-10-CM

## 2017-10-13 MED ORDER — LEVOTHYROXINE SODIUM 25 MCG PO TABS: 25.0000 ug | ORAL_TABLET | Freq: Every day | ORAL | 1 refills | Status: DC

## 2017-10-13 MED ORDER — LEVOTHYROXINE SODIUM 200 MCG PO TABS: 200.0000 ug | ORAL_TABLET | Freq: Every day | ORAL | 1 refills | Status: DC

## 2017-10-13 NOTE — Telephone Encounter (Signed)
Pt notified via My Chart

## 2017-10-13 NOTE — Telephone Encounter (Signed)
-----   Message from Binnie Rail, MD sent at 10/13/2017 12:56 PM EST ----- Urology referral ordered.  Take 1000 mcg B12 daily - anytime.  Ok to take with warfarin.

## 2017-10-13 NOTE — Telephone Encounter (Signed)
Notified [facility] spoke w/christy gave MD response.Marland KitchenJohny Chess

## 2017-10-20 DIAGNOSIS — M25561 Pain in right knee: Secondary | ICD-10-CM | POA: Diagnosis not present

## 2017-10-20 DIAGNOSIS — Z79891 Long term (current) use of opiate analgesic: Secondary | ICD-10-CM | POA: Diagnosis not present

## 2017-10-20 DIAGNOSIS — I872 Venous insufficiency (chronic) (peripheral): Secondary | ICD-10-CM | POA: Diagnosis not present

## 2017-10-20 DIAGNOSIS — J449 Chronic obstructive pulmonary disease, unspecified: Secondary | ICD-10-CM | POA: Diagnosis not present

## 2017-10-20 DIAGNOSIS — M545 Low back pain: Secondary | ICD-10-CM | POA: Diagnosis not present

## 2017-10-20 DIAGNOSIS — E669 Obesity, unspecified: Secondary | ICD-10-CM | POA: Diagnosis not present

## 2017-10-20 DIAGNOSIS — Z6841 Body Mass Index (BMI) 40.0 and over, adult: Secondary | ICD-10-CM | POA: Diagnosis not present

## 2017-10-20 DIAGNOSIS — G8929 Other chronic pain: Secondary | ICD-10-CM | POA: Diagnosis not present

## 2017-10-20 DIAGNOSIS — I10 Essential (primary) hypertension: Secondary | ICD-10-CM | POA: Diagnosis not present

## 2017-10-23 ENCOUNTER — Telehealth: Payer: Self-pay | Admitting: Internal Medicine

## 2017-10-23 NOTE — Telephone Encounter (Signed)
Copied from [place]. Topic: Quick Communication - See Telephone Encounter >> Oct 23, 2017  9:02 AM Ether Griffins B wrote: CRM for notification. See Telephone encounter for:  Monique with [facility] calling requesting verbal orders for PT. 2x a week for 5 weeks for strength, balance, gate training, pain management, safe transfer, and home exercise training.  10/23/17.

## 2017-10-23 NOTE — Telephone Encounter (Signed)
Spoke with Beckie Busing to give verbal orders for PT per MD

## 2017-10-24 DIAGNOSIS — M25561 Pain in right knee: Secondary | ICD-10-CM | POA: Diagnosis not present

## 2017-10-24 DIAGNOSIS — G8929 Other chronic pain: Secondary | ICD-10-CM | POA: Diagnosis not present

## 2017-10-24 DIAGNOSIS — M545 Low back pain: Secondary | ICD-10-CM | POA: Diagnosis not present

## 2017-10-24 DIAGNOSIS — I872 Venous insufficiency (chronic) (peripheral): Secondary | ICD-10-CM | POA: Diagnosis not present

## 2017-10-24 DIAGNOSIS — J449 Chronic obstructive pulmonary disease, unspecified: Secondary | ICD-10-CM | POA: Diagnosis not present

## 2017-10-24 DIAGNOSIS — I10 Essential (primary) hypertension: Secondary | ICD-10-CM | POA: Diagnosis not present

## 2017-10-26 DIAGNOSIS — I10 Essential (primary) hypertension: Secondary | ICD-10-CM | POA: Diagnosis not present

## 2017-10-26 DIAGNOSIS — M545 Low back pain: Secondary | ICD-10-CM | POA: Diagnosis not present

## 2017-10-26 DIAGNOSIS — I872 Venous insufficiency (chronic) (peripheral): Secondary | ICD-10-CM | POA: Diagnosis not present

## 2017-10-26 DIAGNOSIS — M25561 Pain in right knee: Secondary | ICD-10-CM | POA: Diagnosis not present

## 2017-10-26 DIAGNOSIS — J449 Chronic obstructive pulmonary disease, unspecified: Secondary | ICD-10-CM | POA: Diagnosis not present

## 2017-10-26 DIAGNOSIS — G8929 Other chronic pain: Secondary | ICD-10-CM | POA: Diagnosis not present

## 2017-10-31 DIAGNOSIS — G8929 Other chronic pain: Secondary | ICD-10-CM | POA: Diagnosis not present

## 2017-10-31 DIAGNOSIS — I872 Venous insufficiency (chronic) (peripheral): Secondary | ICD-10-CM | POA: Diagnosis not present

## 2017-10-31 DIAGNOSIS — I10 Essential (primary) hypertension: Secondary | ICD-10-CM | POA: Diagnosis not present

## 2017-10-31 DIAGNOSIS — M25561 Pain in right knee: Secondary | ICD-10-CM | POA: Diagnosis not present

## 2017-10-31 DIAGNOSIS — J449 Chronic obstructive pulmonary disease, unspecified: Secondary | ICD-10-CM | POA: Diagnosis not present

## 2017-10-31 DIAGNOSIS — M545 Low back pain: Secondary | ICD-10-CM | POA: Diagnosis not present

## 2017-11-01 ENCOUNTER — Encounter: Payer: Self-pay | Admitting: Internal Medicine

## 2017-11-02 DIAGNOSIS — M545 Low back pain: Secondary | ICD-10-CM | POA: Diagnosis not present

## 2017-11-02 DIAGNOSIS — M25561 Pain in right knee: Secondary | ICD-10-CM | POA: Diagnosis not present

## 2017-11-02 DIAGNOSIS — G8929 Other chronic pain: Secondary | ICD-10-CM | POA: Diagnosis not present

## 2017-11-02 DIAGNOSIS — J449 Chronic obstructive pulmonary disease, unspecified: Secondary | ICD-10-CM | POA: Diagnosis not present

## 2017-11-02 DIAGNOSIS — I872 Venous insufficiency (chronic) (peripheral): Secondary | ICD-10-CM | POA: Diagnosis not present

## 2017-11-02 DIAGNOSIS — I10 Essential (primary) hypertension: Secondary | ICD-10-CM | POA: Diagnosis not present

## 2017-11-07 ENCOUNTER — Ambulatory Visit: Payer: Medicare Other

## 2017-11-07 DIAGNOSIS — G8929 Other chronic pain: Secondary | ICD-10-CM | POA: Diagnosis not present

## 2017-11-07 DIAGNOSIS — I10 Essential (primary) hypertension: Secondary | ICD-10-CM | POA: Diagnosis not present

## 2017-11-07 DIAGNOSIS — M545 Low back pain: Secondary | ICD-10-CM | POA: Diagnosis not present

## 2017-11-07 DIAGNOSIS — I872 Venous insufficiency (chronic) (peripheral): Secondary | ICD-10-CM | POA: Diagnosis not present

## 2017-11-07 DIAGNOSIS — J449 Chronic obstructive pulmonary disease, unspecified: Secondary | ICD-10-CM | POA: Diagnosis not present

## 2017-11-07 DIAGNOSIS — M25561 Pain in right knee: Secondary | ICD-10-CM | POA: Diagnosis not present

## 2017-11-08 DIAGNOSIS — I872 Venous insufficiency (chronic) (peripheral): Secondary | ICD-10-CM | POA: Diagnosis not present

## 2017-11-08 DIAGNOSIS — M25561 Pain in right knee: Secondary | ICD-10-CM | POA: Diagnosis not present

## 2017-11-08 DIAGNOSIS — J449 Chronic obstructive pulmonary disease, unspecified: Secondary | ICD-10-CM | POA: Diagnosis not present

## 2017-11-08 DIAGNOSIS — G8929 Other chronic pain: Secondary | ICD-10-CM | POA: Diagnosis not present

## 2017-11-08 DIAGNOSIS — I10 Essential (primary) hypertension: Secondary | ICD-10-CM | POA: Diagnosis not present

## 2017-11-08 DIAGNOSIS — Z79891 Long term (current) use of opiate analgesic: Secondary | ICD-10-CM | POA: Diagnosis not present

## 2017-11-08 DIAGNOSIS — Z6841 Body Mass Index (BMI) 40.0 and over, adult: Secondary | ICD-10-CM | POA: Diagnosis not present

## 2017-11-08 DIAGNOSIS — E669 Obesity, unspecified: Secondary | ICD-10-CM | POA: Diagnosis not present

## 2017-11-08 DIAGNOSIS — M545 Low back pain: Secondary | ICD-10-CM | POA: Diagnosis not present

## 2017-11-09 ENCOUNTER — Ambulatory Visit: Payer: Medicare Other | Admitting: Vascular Surgery

## 2017-11-09 ENCOUNTER — Encounter (HOSPITAL_COMMUNITY): Payer: Medicare Other

## 2017-11-10 ENCOUNTER — Other Ambulatory Visit: Payer: Self-pay | Admitting: Internal Medicine

## 2017-11-10 DIAGNOSIS — N3946 Mixed incontinence: Secondary | ICD-10-CM | POA: Insufficient documentation

## 2017-11-10 DIAGNOSIS — R3121 Asymptomatic microscopic hematuria: Secondary | ICD-10-CM | POA: Diagnosis not present

## 2017-11-10 DIAGNOSIS — R828 Abnormal findings on cytological and histological examination of urine: Secondary | ICD-10-CM | POA: Diagnosis not present

## 2017-11-14 DIAGNOSIS — J449 Chronic obstructive pulmonary disease, unspecified: Secondary | ICD-10-CM | POA: Diagnosis not present

## 2017-11-14 DIAGNOSIS — G8929 Other chronic pain: Secondary | ICD-10-CM | POA: Diagnosis not present

## 2017-11-14 DIAGNOSIS — I872 Venous insufficiency (chronic) (peripheral): Secondary | ICD-10-CM | POA: Diagnosis not present

## 2017-11-14 DIAGNOSIS — M25561 Pain in right knee: Secondary | ICD-10-CM | POA: Diagnosis not present

## 2017-11-14 DIAGNOSIS — M545 Low back pain: Secondary | ICD-10-CM | POA: Diagnosis not present

## 2017-11-14 DIAGNOSIS — I10 Essential (primary) hypertension: Secondary | ICD-10-CM | POA: Diagnosis not present

## 2017-11-21 DIAGNOSIS — I10 Essential (primary) hypertension: Secondary | ICD-10-CM | POA: Diagnosis not present

## 2017-11-21 DIAGNOSIS — J449 Chronic obstructive pulmonary disease, unspecified: Secondary | ICD-10-CM | POA: Diagnosis not present

## 2017-11-21 DIAGNOSIS — G8929 Other chronic pain: Secondary | ICD-10-CM | POA: Diagnosis not present

## 2017-11-21 DIAGNOSIS — I872 Venous insufficiency (chronic) (peripheral): Secondary | ICD-10-CM | POA: Diagnosis not present

## 2017-11-21 DIAGNOSIS — M25561 Pain in right knee: Secondary | ICD-10-CM | POA: Diagnosis not present

## 2017-11-21 DIAGNOSIS — M545 Low back pain: Secondary | ICD-10-CM | POA: Diagnosis not present

## 2017-11-22 ENCOUNTER — Telehealth: Payer: Self-pay | Admitting: Internal Medicine

## 2017-11-22 NOTE — Telephone Encounter (Signed)
Copied from [place] [id number]. Topic: Quick Communication - See Telephone Encounter >> Nov 22, 2017  5:10 PM [place], Laban Emperor wrote: CRM for notification. See Telephone encounter for: 11/22/17.  Monique from [facility] calling to request verbal orders: PT for 2 week 3 for continue strength, gait balance training and home exercise program.  Contact # [phone]

## 2017-11-23 DIAGNOSIS — M545 Low back pain: Secondary | ICD-10-CM | POA: Diagnosis not present

## 2017-11-23 DIAGNOSIS — I872 Venous insufficiency (chronic) (peripheral): Secondary | ICD-10-CM | POA: Diagnosis not present

## 2017-11-23 DIAGNOSIS — I10 Essential (primary) hypertension: Secondary | ICD-10-CM | POA: Diagnosis not present

## 2017-11-23 DIAGNOSIS — J449 Chronic obstructive pulmonary disease, unspecified: Secondary | ICD-10-CM | POA: Diagnosis not present

## 2017-11-23 DIAGNOSIS — M25561 Pain in right knee: Secondary | ICD-10-CM | POA: Diagnosis not present

## 2017-11-23 DIAGNOSIS — G8929 Other chronic pain: Secondary | ICD-10-CM | POA: Diagnosis not present

## 2017-11-23 NOTE — Telephone Encounter (Signed)
LVM giving verbal orders per MD.  

## 2017-11-28 ENCOUNTER — Ambulatory Visit (INDEPENDENT_AMBULATORY_CARE_PROVIDER_SITE_OTHER): Payer: Medicare Other

## 2017-11-28 ENCOUNTER — Ambulatory Visit (INDEPENDENT_AMBULATORY_CARE_PROVIDER_SITE_OTHER): Payer: Self-pay

## 2017-11-28 ENCOUNTER — Encounter (INDEPENDENT_AMBULATORY_CARE_PROVIDER_SITE_OTHER): Payer: Self-pay | Admitting: Orthopedic Surgery

## 2017-11-28 ENCOUNTER — Ambulatory Visit (INDEPENDENT_AMBULATORY_CARE_PROVIDER_SITE_OTHER): Payer: Medicare Other | Admitting: Orthopedic Surgery

## 2017-11-28 ENCOUNTER — Ambulatory Visit (INDEPENDENT_AMBULATORY_CARE_PROVIDER_SITE_OTHER): Payer: Medicare Other | Admitting: General Practice

## 2017-11-28 DIAGNOSIS — Z7901 Long term (current) use of anticoagulants: Secondary | ICD-10-CM

## 2017-11-28 DIAGNOSIS — M170 Bilateral primary osteoarthritis of knee: Secondary | ICD-10-CM

## 2017-11-28 DIAGNOSIS — M25561 Pain in right knee: Secondary | ICD-10-CM | POA: Diagnosis not present

## 2017-11-28 DIAGNOSIS — M25562 Pain in left knee: Secondary | ICD-10-CM

## 2017-11-28 DIAGNOSIS — Z86711 Personal history of pulmonary embolism: Secondary | ICD-10-CM

## 2017-11-28 LAB — POCT INR: INR: 4.9

## 2017-11-28 NOTE — Progress Notes (Signed)
Office Visit Note   Patient: [name]           Date of Birth: 26-Jun-1956           MRN: [id number] Visit Date: 11/28/2017              Requested by: Binnie Rail, MD [place], [place] [id number] PCP: Binnie Rail, MD  Chief Complaint  Patient presents with  . Right Ankle - Pain  . Left Ankle - Pain      HPI: Patient is a 62 year old woman BMI greater than 40 with bilateral knee pain and venous and lymphatic insufficiency both lower extremities.  Patient complains of grinding popping pain and burning for over 20 years.  Patient states that Dr. supple did arthroscopic surgery about 20 years ago.  She takes methadone and hydromorphone for her back pain.  Patient states she currently is undergoing home health physical therapy.  Assessment & Plan: Visit Diagnoses:  1. Left knee pain, unspecified chronicity   2. Right knee pain, unspecified chronicity     Plan: Patient was placed in a knee high  Double extra-large medical compression stockings she tolerated this well the proper way to put the socks on was discussed the proper care of his socks was discussed.  She will wear these around-the-clock to ensure there are no wrinkles.  Reevaluation in 4 weeks to see if an additional injection of her see if she would be a candidate for hyaluronic acid.  Discussed importance of weight reduction if she was to consider total knee arthroplasty.  We will request authorization for hyaluronic acid.  Follow-Up Instructions: No follow-ups on file.   Ortho Exam  Patient is alert, oriented, no adenopathy, well-dressed, normal affect, normal respiratory effort. Examination patient has an antalgic gait she has difficulty getting from a sitting to a standing position.  She has crepitation range of motion of both knees with varus alignment bilaterally collaterals and cruciates are stable medial lateral joint line as well as the patellofemoral joints are tender to palpation.  She has  brawny skin color changes in both legs with very small ulcers.  She has thick indurated skin.  Patient's calf measures 56 cm in circumference.  This is a double extra-large medical compression stocking.  Imaging: No results found. No images are attached to the encounter.  Labs: Lab Results  Component Value Date   HGBA1C 5.5 10/10/2017   HGBA1C 5.5 06/10/2014   REPTSTATUS 10/11/2015 FINAL 10/10/2015   GRAMSTAIN  07/19/2015    RARE WBC PRESENT, PREDOMINANTLY PMN RARE SQUAMOUS EPITHELIAL CELLS PRESENT ABUNDANT GRAM NEGATIVE RODS ABUNDANT GRAM POSITIVE COCCI IN PAIRS Performed at [facility]    GRAMSTAIN  07/19/2015    RARE WBC PRESENT, PREDOMINANTLY PMN NO SQUAMOUS EPITHELIAL CELLS SEEN ABUNDANT GRAM POSITIVE COCCI IN PAIRS Performed at [place], SUGGEST RECOLLECTION 10/10/2015   LABORGA PSEUDOMONAS AERUGINOSA 07/19/2015   LABORGA STAPHYLOCOCCUS AUREUS 07/19/2015   LABORGA STAPHYLOCOCCUS AUREUS 07/19/2015   LABORGA PSEUDOMONAS AERUGINOSA 07/19/2015    @LABSALLVALUES (HGBA1)@  There is no height or weight on file to calculate BMI.  Orders:  Orders Placed This Encounter  Procedures  . XR Knee 1-2 Views Left  . XR Knee 1-2 Views Right   No orders of the defined types were placed in this encounter.    Procedures: Large Joint Inj: bilateral knee on 11/28/2017 3:08 PM Indications: pain and diagnostic evaluation Details: 22 G 1.5 in  needle, anteromedial approach  Arthrogram: No  Outcome: tolerated well, no immediate complications Procedure, treatment alternatives, risks and benefits explained, specific risks discussed. Consent was given by the patient. Immediately prior to procedure a time out was called to verify the correct patient, procedure, equipment, support staff and site/side marked as required. Patient was prepped and draped in the usual sterile fashion.      Clinical Data: No additional  findings.  ROS:  All other systems negative, except as noted in the HPI. Review of Systems  Objective: Vital Signs: There were no vitals taken for this visit.  Specialty Comments:  No specialty comments available.  PMFS History: Patient Active Problem List   Diagnosis Date Noted  . B12 deficiency 10/12/2017  . Microscopic hematuria 10/12/2017  . Hyperglycemia 10/10/2017  . Numbness and tingling 10/10/2017  . Lower abdominal pain 10/10/2017  . Positive colorectal cancer screening using Cologuard test   . Right knee pain 11/18/2016  . Female bladder prolapse 10/10/2016  . Long term current use of anticoagulant 10/10/2016  . Falls frequently 07/14/2016  . Syncope 07/14/2016  . Generalized weakness   . Hyperlipidemia 06/10/2014  . Severe obesity (BMI >= 40) ([place]) 06/10/2014  . CAD (coronary artery disease)   . Interstitial cystitis 01/31/2013  . Chronic narcotic dependence ([place]) 01/30/2013  . S/P hip replacement 11/01/2012  . Venous insufficiency (chronic) (peripheral)   . Urinary frequency 09/22/2011  . Atherosclerosis of native arteries of the extremities with ulceration ([place]) 07/21/2011  . Esophageal spasm 06/14/2011  . Visual floaters 05/31/2011  . Constipation 05/12/2011  . Hypothyroidism 01/19/2010  . LEG CRAMPS, NOCTURNAL 01/18/2010  . Bilateral leg edema 01/18/2010  . History of pulmonary embolism 08/10/2007  . 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 LOW BACK PAIN, CHRONIC 08/10/2007  . MEDIASTINAL ADENOPATHY CASTLEMANS D 08/10/2007   Past Medical History:  Diagnosis Date  . Anemia, unspecified   . Anxiety   . Arthritis    "about q joint i've got" (07/14/2016)  . Benign neoplasm of colon 12/2007   Hyperplastic colon polyps  . CAD (coronary artery disease)    minimal catheterization, October 2008  . Cellulitis   . Chest pain    with stress  . Childhood asthma   . Chronic low back pain   . Constipation    and diarrhea chronic..Dr. Alben Spittle  .  Depression   . Diverticulosis of colon (without mention of hemorrhage)   . DVT (deep venous thrombosis) (HCC) 1990s   LLE  . Family history of adverse reaction to anesthesia    "daughter died having her 1st child cause she got too much anesthesia"   . GERD (gastroesophageal reflux disease)   . History of blood transfusion 01/2008   "related to hip OR"  . 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 Hypopotassemia   . Hypotension, unspecified   . Leg pain   . 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  . Methadone adverse reaction    for chronic leg and back pain  . Other pulmonary embolism and infarction 2008   Significant 2008 and coumadin therapy RV dysfunction...echo...2008..EF 50%..right ventricle markedly dilated w marked right ventricular dysfunc and moder tricuspid regurg/echo..March 2010, Ef 50%, mild dilation of right ventricule w mild decrease right ventric function   . Peripheral vascular disease ([place])   . Rectus sheath hematoma 07/13/2012   On coumadin   . Right bundle branch block  intermittent  . Thyroid disease   . Tobacco use disorder   . Urinary incontinence   . Venous insufficiency (chronic) (peripheral)    Patient's legs were wrapped 2013    Family History  Problem Relation Age of Onset  . Heart disease Mother   . Heart disease Father   . Crohn's disease Sister     Past Surgical History:  Procedure Laterality Date  . CARDIAC CATHETERIZATION  05/2007   Archie Endo 12/08/2010  . CHOLECYSTECTOMY OPEN    . COLONOSCOPY WITH PROPOFOL N/A 08/11/2017   Procedure: COLONOSCOPY WITH PROPOFOL;  Surgeon: Doran Stabler, MD;  Location: [facility];  Service: Gastroenterology;  Laterality: N/A;  . GASTRIC BYPASS  1980s  . JOINT REPLACEMENT    . KNEE ARTHROSCOPY Left 05/2001   Archie Endo 12/21/2010  . SHOULDER ARTHROSCOPY W/ ROTATOR CUFF REPAIR Right 08/2002   Archie Endo 12/21/2010  . TONSILLECTOMY    . TOTAL HIP  ARTHROPLASTY Right 01/2008   Social History   Occupational History  . Occupation: Disabled    Employer: UNEMPLOYED  Tobacco Use  . Smoking status: Former Smoker    Packs/day: 0.10    Years: 27.00    Pack years: 2.70    Types: Cigarettes    Last attempt to quit: 06/09/2011    Years since quitting: 6.4  . Smokeless tobacco: Never Used  Substance and Sexual Activity  . Alcohol use: No    Alcohol/week: 0.0 oz    Comment: 07/14/2016 "nothing since the early 1990s"  . Drug use: No  . Sexual activity: Not on file

## 2017-11-28 NOTE — Progress Notes (Signed)
xr

## 2017-11-28 NOTE — Patient Instructions (Addendum)
Pre visit review using our clinic review tool, if applicable. No additional management support is needed unless otherwise documented below in the visit note.  Hold coumadin today and tomorrow (4/23 and 4/24) and then continue to take 1 tablet daily except 2 tablets on Monday, Wednesdays and Fridays.  Re-check in 2 weeks

## 2017-11-30 ENCOUNTER — Telehealth (INDEPENDENT_AMBULATORY_CARE_PROVIDER_SITE_OTHER): Payer: Self-pay

## 2017-11-30 NOTE — Telephone Encounter (Signed)
Submitted application online for Monovisc injection, bilateral knee. 

## 2017-12-01 DIAGNOSIS — I872 Venous insufficiency (chronic) (peripheral): Secondary | ICD-10-CM | POA: Diagnosis not present

## 2017-12-01 DIAGNOSIS — J449 Chronic obstructive pulmonary disease, unspecified: Secondary | ICD-10-CM | POA: Diagnosis not present

## 2017-12-01 DIAGNOSIS — I10 Essential (primary) hypertension: Secondary | ICD-10-CM | POA: Diagnosis not present

## 2017-12-01 DIAGNOSIS — M545 Low back pain: Secondary | ICD-10-CM | POA: Diagnosis not present

## 2017-12-01 DIAGNOSIS — G8929 Other chronic pain: Secondary | ICD-10-CM | POA: Diagnosis not present

## 2017-12-01 DIAGNOSIS — M25561 Pain in right knee: Secondary | ICD-10-CM | POA: Diagnosis not present

## 2017-12-05 DIAGNOSIS — G8929 Other chronic pain: Secondary | ICD-10-CM | POA: Diagnosis not present

## 2017-12-05 DIAGNOSIS — M545 Low back pain: Secondary | ICD-10-CM | POA: Diagnosis not present

## 2017-12-05 DIAGNOSIS — I872 Venous insufficiency (chronic) (peripheral): Secondary | ICD-10-CM | POA: Diagnosis not present

## 2017-12-05 DIAGNOSIS — I10 Essential (primary) hypertension: Secondary | ICD-10-CM | POA: Diagnosis not present

## 2017-12-05 DIAGNOSIS — J449 Chronic obstructive pulmonary disease, unspecified: Secondary | ICD-10-CM | POA: Diagnosis not present

## 2017-12-05 DIAGNOSIS — M25561 Pain in right knee: Secondary | ICD-10-CM | POA: Diagnosis not present

## 2017-12-07 ENCOUNTER — Ambulatory Visit (INDEPENDENT_AMBULATORY_CARE_PROVIDER_SITE_OTHER): Payer: Medicare Other | Admitting: Vascular Surgery

## 2017-12-07 ENCOUNTER — Ambulatory Visit (HOSPITAL_COMMUNITY)
Admission: RE | Admit: 2017-12-07 | Discharge: 2017-12-07 | Disposition: A | Discharge status: Home / Self Care | Payer: Medicare Other | Source: Ambulatory Visit | Attending: Vascular Surgery | Admitting: Vascular Surgery

## 2017-12-07 ENCOUNTER — Encounter (HOSPITAL_COMMUNITY): Payer: Medicare Other

## 2017-12-07 ENCOUNTER — Ambulatory Visit: Payer: Medicare Other | Admitting: Vascular Surgery

## 2017-12-07 ENCOUNTER — Encounter: Payer: Self-pay | Admitting: Vascular Surgery

## 2017-12-07 ENCOUNTER — Other Ambulatory Visit: Payer: Self-pay

## 2017-12-07 VITALS — BP 113/75 | HR 59 | Temp 98.2°F | Resp 20 | Ht 65.0 in | Wt 312.0 lb

## 2017-12-07 DIAGNOSIS — M25561 Pain in right knee: Secondary | ICD-10-CM

## 2017-12-07 DIAGNOSIS — I872 Venous insufficiency (chronic) (peripheral): Secondary | ICD-10-CM | POA: Insufficient documentation

## 2017-12-07 DIAGNOSIS — R0989 Other specified symptoms and signs involving the circulatory and respiratory systems: Secondary | ICD-10-CM | POA: Diagnosis not present

## 2017-12-07 DIAGNOSIS — M545 Low back pain: Secondary | ICD-10-CM | POA: Diagnosis not present

## 2017-12-07 DIAGNOSIS — M25562 Pain in left knee: Secondary | ICD-10-CM | POA: Diagnosis not present

## 2017-12-07 DIAGNOSIS — G8929 Other chronic pain: Secondary | ICD-10-CM | POA: Diagnosis not present

## 2017-12-07 DIAGNOSIS — I83813 Varicose veins of bilateral lower extremities with pain: Secondary | ICD-10-CM | POA: Diagnosis not present

## 2017-12-07 DIAGNOSIS — R600 Localized edema: Secondary | ICD-10-CM

## 2017-12-07 DIAGNOSIS — J449 Chronic obstructive pulmonary disease, unspecified: Secondary | ICD-10-CM | POA: Diagnosis not present

## 2017-12-07 DIAGNOSIS — I10 Essential (primary) hypertension: Secondary | ICD-10-CM | POA: Diagnosis not present

## 2017-12-07 NOTE — Progress Notes (Signed)
Patient is a 62 year old female who returns for follow-up today.  We have followed her for several years for intermittent recurrence of stasis ulcers.  She currently has no open wounds on her lower extremities.  She does have prior evidence of deep and superficial venous reflux but I did not feel she was a very good candidate for laser ablation due to the fact that she is overall fairly debilitated and she has usually is been able to heal these ulcers fairly easily with compression.  She currently is wearing Dr. Sharol Given to his silver compression socks and has had no recurrence of symptoms.  She has severe degenerative arthritis in both legs and is followed by Dr. Sharol Given for this.  She does not describe claudication symptoms but is not very ambulatory.  Review of systems: She gets short of breath with exertion.  She denies chest pain.  Physical exam:  Vitals:   12/07/17 1308  BP: 113/75  Pulse: (!) 59  Resp: 20  Temp: 98.2 F (36.8 C)  TempSrc: Oral  SpO2: 93%  Weight: (!) 312 lb (141.5 kg)  Height: 5\' 5"  (1.651 m)    Extremities: Brawny staining bilaterally gaiter area no palpable pedal pulses no open ulcerations or wounds  Chest: Clear to auscultation bilaterally  Cardiac: Regular rate and rhythm  Neck: No carotid bruits  Data: Patient had bilateral ABIs performed today.  There were triphasic greater than 1 and normal bilaterally.  Assessment: Currently doing well with no recurrent venous ulcerations.  No evidence of arterial occlusive disease.  Plan: The patient will continue to follow-up with [place] intermittently if she has recurrent ulcerations.  Otherwise she will see [place] on an as-needed basis.  She will continue to wear the compression stockings.  I discussed with her that if these become loose over time they will need to be replaced and she understood this.  Ruta Hinds, MD [facility] Office: [phone] Pager: [phone]

## 2017-12-12 ENCOUNTER — Telehealth: Payer: Self-pay | Admitting: Internal Medicine

## 2017-12-12 ENCOUNTER — Ambulatory Visit (INDEPENDENT_AMBULATORY_CARE_PROVIDER_SITE_OTHER): Payer: Medicare Other | Admitting: General Practice

## 2017-12-12 DIAGNOSIS — M545 Low back pain: Secondary | ICD-10-CM | POA: Diagnosis not present

## 2017-12-12 DIAGNOSIS — I872 Venous insufficiency (chronic) (peripheral): Secondary | ICD-10-CM | POA: Diagnosis not present

## 2017-12-12 DIAGNOSIS — J449 Chronic obstructive pulmonary disease, unspecified: Secondary | ICD-10-CM | POA: Diagnosis not present

## 2017-12-12 DIAGNOSIS — Z7901 Long term (current) use of anticoagulants: Secondary | ICD-10-CM

## 2017-12-12 DIAGNOSIS — I10 Essential (primary) hypertension: Secondary | ICD-10-CM | POA: Diagnosis not present

## 2017-12-12 DIAGNOSIS — Z86711 Personal history of pulmonary embolism: Secondary | ICD-10-CM

## 2017-12-12 DIAGNOSIS — G8929 Other chronic pain: Secondary | ICD-10-CM | POA: Diagnosis not present

## 2017-12-12 DIAGNOSIS — M25561 Pain in right knee: Secondary | ICD-10-CM | POA: Diagnosis not present

## 2017-12-12 LAB — POCT INR: INR: 2.3

## 2017-12-12 NOTE — Telephone Encounter (Signed)
Copied from [place]. Topic: Quick Communication - See Telephone Encounter >> Dec 12, 2017  4:59 PM Rutherford Nail, NT wrote: CRM for notification. See Telephone encounter for: 12/12/17. Monique from [facility] calling and needs verbal orders for the following:  Continued physical therapy services for 2x a week for 4 weeks. CB#: [phone]

## 2017-12-12 NOTE — Patient Instructions (Addendum)
Pre visit review using our clinic review tool, if applicable. No additional management support is needed unless otherwise documented below in the visit note.  Continue to take 1 tablet daily except 2 tablets on Monday, Wednesdays and Fridays.  Re-check in 4 weeks

## 2017-12-13 NOTE — Telephone Encounter (Signed)
Spoke with Beckie Busing to give verbal orders for PT per MD

## 2017-12-14 DIAGNOSIS — I10 Essential (primary) hypertension: Secondary | ICD-10-CM | POA: Diagnosis not present

## 2017-12-14 DIAGNOSIS — J449 Chronic obstructive pulmonary disease, unspecified: Secondary | ICD-10-CM | POA: Diagnosis not present

## 2017-12-14 DIAGNOSIS — G8929 Other chronic pain: Secondary | ICD-10-CM | POA: Diagnosis not present

## 2017-12-14 DIAGNOSIS — M545 Low back pain: Secondary | ICD-10-CM | POA: Diagnosis not present

## 2017-12-14 DIAGNOSIS — I872 Venous insufficiency (chronic) (peripheral): Secondary | ICD-10-CM | POA: Diagnosis not present

## 2017-12-14 DIAGNOSIS — M25561 Pain in right knee: Secondary | ICD-10-CM | POA: Diagnosis not present

## 2017-12-14 NOTE — Progress Notes (Signed)
Agree with management.  [name] Carmen Jiro Kiester, MD  

## 2017-12-18 DIAGNOSIS — N3946 Mixed incontinence: Secondary | ICD-10-CM | POA: Diagnosis not present

## 2017-12-18 DIAGNOSIS — Z7901 Long term (current) use of anticoagulants: Secondary | ICD-10-CM | POA: Diagnosis not present

## 2017-12-18 DIAGNOSIS — R3121 Asymptomatic microscopic hematuria: Secondary | ICD-10-CM | POA: Diagnosis not present

## 2017-12-19 DIAGNOSIS — J449 Chronic obstructive pulmonary disease, unspecified: Secondary | ICD-10-CM | POA: Diagnosis not present

## 2017-12-19 DIAGNOSIS — Z86711 Personal history of pulmonary embolism: Secondary | ICD-10-CM | POA: Diagnosis not present

## 2017-12-19 DIAGNOSIS — Z7901 Long term (current) use of anticoagulants: Secondary | ICD-10-CM | POA: Diagnosis not present

## 2017-12-19 DIAGNOSIS — E669 Obesity, unspecified: Secondary | ICD-10-CM | POA: Diagnosis not present

## 2017-12-19 DIAGNOSIS — I872 Venous insufficiency (chronic) (peripheral): Secondary | ICD-10-CM | POA: Diagnosis not present

## 2017-12-19 DIAGNOSIS — M545 Low back pain: Secondary | ICD-10-CM | POA: Diagnosis not present

## 2017-12-19 DIAGNOSIS — I10 Essential (primary) hypertension: Secondary | ICD-10-CM | POA: Diagnosis not present

## 2017-12-19 DIAGNOSIS — Z79891 Long term (current) use of opiate analgesic: Secondary | ICD-10-CM | POA: Diagnosis not present

## 2017-12-19 DIAGNOSIS — Z6841 Body Mass Index (BMI) 40.0 and over, adult: Secondary | ICD-10-CM | POA: Diagnosis not present

## 2017-12-19 DIAGNOSIS — G8929 Other chronic pain: Secondary | ICD-10-CM | POA: Diagnosis not present

## 2017-12-19 DIAGNOSIS — I70203 Unspecified atherosclerosis of native arteries of extremities, bilateral legs: Secondary | ICD-10-CM | POA: Diagnosis not present

## 2017-12-21 DIAGNOSIS — G8929 Other chronic pain: Secondary | ICD-10-CM | POA: Diagnosis not present

## 2017-12-21 DIAGNOSIS — J449 Chronic obstructive pulmonary disease, unspecified: Secondary | ICD-10-CM | POA: Diagnosis not present

## 2017-12-21 DIAGNOSIS — M545 Low back pain: Secondary | ICD-10-CM | POA: Diagnosis not present

## 2017-12-21 DIAGNOSIS — I872 Venous insufficiency (chronic) (peripheral): Secondary | ICD-10-CM | POA: Diagnosis not present

## 2017-12-21 DIAGNOSIS — I10 Essential (primary) hypertension: Secondary | ICD-10-CM | POA: Diagnosis not present

## 2017-12-21 DIAGNOSIS — I70203 Unspecified atherosclerosis of native arteries of extremities, bilateral legs: Secondary | ICD-10-CM | POA: Diagnosis not present

## 2017-12-24 ENCOUNTER — Encounter: Payer: Self-pay | Admitting: Internal Medicine

## 2017-12-27 DIAGNOSIS — I10 Essential (primary) hypertension: Secondary | ICD-10-CM | POA: Diagnosis not present

## 2017-12-27 DIAGNOSIS — I872 Venous insufficiency (chronic) (peripheral): Secondary | ICD-10-CM | POA: Diagnosis not present

## 2017-12-27 DIAGNOSIS — G8929 Other chronic pain: Secondary | ICD-10-CM | POA: Diagnosis not present

## 2017-12-27 DIAGNOSIS — I70203 Unspecified atherosclerosis of native arteries of extremities, bilateral legs: Secondary | ICD-10-CM | POA: Diagnosis not present

## 2017-12-27 DIAGNOSIS — J449 Chronic obstructive pulmonary disease, unspecified: Secondary | ICD-10-CM | POA: Diagnosis not present

## 2017-12-27 DIAGNOSIS — M545 Low back pain: Secondary | ICD-10-CM | POA: Diagnosis not present

## 2017-12-28 ENCOUNTER — Ambulatory Visit (INDEPENDENT_AMBULATORY_CARE_PROVIDER_SITE_OTHER): Payer: Medicare Other | Admitting: Orthopedic Surgery

## 2017-12-29 DIAGNOSIS — I70203 Unspecified atherosclerosis of native arteries of extremities, bilateral legs: Secondary | ICD-10-CM | POA: Diagnosis not present

## 2017-12-29 DIAGNOSIS — G8929 Other chronic pain: Secondary | ICD-10-CM | POA: Diagnosis not present

## 2017-12-29 DIAGNOSIS — I872 Venous insufficiency (chronic) (peripheral): Secondary | ICD-10-CM | POA: Diagnosis not present

## 2017-12-29 DIAGNOSIS — I10 Essential (primary) hypertension: Secondary | ICD-10-CM | POA: Diagnosis not present

## 2017-12-29 DIAGNOSIS — M545 Low back pain: Secondary | ICD-10-CM | POA: Diagnosis not present

## 2017-12-29 DIAGNOSIS — J449 Chronic obstructive pulmonary disease, unspecified: Secondary | ICD-10-CM | POA: Diagnosis not present

## 2018-01-02 DIAGNOSIS — I10 Essential (primary) hypertension: Secondary | ICD-10-CM | POA: Diagnosis not present

## 2018-01-02 DIAGNOSIS — G8929 Other chronic pain: Secondary | ICD-10-CM | POA: Diagnosis not present

## 2018-01-02 DIAGNOSIS — J449 Chronic obstructive pulmonary disease, unspecified: Secondary | ICD-10-CM | POA: Diagnosis not present

## 2018-01-02 DIAGNOSIS — M545 Low back pain: Secondary | ICD-10-CM | POA: Diagnosis not present

## 2018-01-02 DIAGNOSIS — I70203 Unspecified atherosclerosis of native arteries of extremities, bilateral legs: Secondary | ICD-10-CM | POA: Diagnosis not present

## 2018-01-02 DIAGNOSIS — I872 Venous insufficiency (chronic) (peripheral): Secondary | ICD-10-CM | POA: Diagnosis not present

## 2018-01-04 DIAGNOSIS — G8929 Other chronic pain: Secondary | ICD-10-CM | POA: Diagnosis not present

## 2018-01-04 DIAGNOSIS — M545 Low back pain: Secondary | ICD-10-CM | POA: Diagnosis not present

## 2018-01-04 DIAGNOSIS — I872 Venous insufficiency (chronic) (peripheral): Secondary | ICD-10-CM | POA: Diagnosis not present

## 2018-01-04 DIAGNOSIS — I70203 Unspecified atherosclerosis of native arteries of extremities, bilateral legs: Secondary | ICD-10-CM | POA: Diagnosis not present

## 2018-01-04 DIAGNOSIS — J449 Chronic obstructive pulmonary disease, unspecified: Secondary | ICD-10-CM | POA: Diagnosis not present

## 2018-01-04 DIAGNOSIS — I10 Essential (primary) hypertension: Secondary | ICD-10-CM | POA: Diagnosis not present

## 2018-01-09 ENCOUNTER — Ambulatory Visit (INDEPENDENT_AMBULATORY_CARE_PROVIDER_SITE_OTHER): Payer: Medicare Other | Admitting: General Practice

## 2018-01-09 DIAGNOSIS — Z7901 Long term (current) use of anticoagulants: Secondary | ICD-10-CM

## 2018-01-09 DIAGNOSIS — Z86711 Personal history of pulmonary embolism: Secondary | ICD-10-CM

## 2018-01-09 LAB — POCT INR: INR: 2.7 (ref 2.0–3.0)

## 2018-01-09 NOTE — Patient Instructions (Addendum)
Pre visit review using our clinic review tool, if applicable. No additional management support is needed unless otherwise documented below in the visit note.  Continue to take 1 tablet daily except 2 tablets on Monday, Wednesdays and Fridays.  Re-check in 6 weeks

## 2018-01-10 DIAGNOSIS — J449 Chronic obstructive pulmonary disease, unspecified: Secondary | ICD-10-CM | POA: Diagnosis not present

## 2018-01-10 DIAGNOSIS — I70203 Unspecified atherosclerosis of native arteries of extremities, bilateral legs: Secondary | ICD-10-CM | POA: Diagnosis not present

## 2018-01-10 DIAGNOSIS — G8929 Other chronic pain: Secondary | ICD-10-CM | POA: Diagnosis not present

## 2018-01-10 DIAGNOSIS — M545 Low back pain: Secondary | ICD-10-CM | POA: Diagnosis not present

## 2018-01-10 DIAGNOSIS — I10 Essential (primary) hypertension: Secondary | ICD-10-CM | POA: Diagnosis not present

## 2018-01-10 DIAGNOSIS — I872 Venous insufficiency (chronic) (peripheral): Secondary | ICD-10-CM | POA: Diagnosis not present

## 2018-01-11 DIAGNOSIS — M545 Low back pain: Secondary | ICD-10-CM | POA: Diagnosis not present

## 2018-01-11 DIAGNOSIS — I70203 Unspecified atherosclerosis of native arteries of extremities, bilateral legs: Secondary | ICD-10-CM | POA: Diagnosis not present

## 2018-01-11 DIAGNOSIS — Z86711 Personal history of pulmonary embolism: Secondary | ICD-10-CM | POA: Diagnosis not present

## 2018-01-11 DIAGNOSIS — J449 Chronic obstructive pulmonary disease, unspecified: Secondary | ICD-10-CM | POA: Diagnosis not present

## 2018-01-11 DIAGNOSIS — Z6841 Body Mass Index (BMI) 40.0 and over, adult: Secondary | ICD-10-CM | POA: Diagnosis not present

## 2018-01-11 DIAGNOSIS — Z7901 Long term (current) use of anticoagulants: Secondary | ICD-10-CM | POA: Diagnosis not present

## 2018-01-11 DIAGNOSIS — I872 Venous insufficiency (chronic) (peripheral): Secondary | ICD-10-CM | POA: Diagnosis not present

## 2018-01-11 DIAGNOSIS — Z79891 Long term (current) use of opiate analgesic: Secondary | ICD-10-CM | POA: Diagnosis not present

## 2018-01-11 DIAGNOSIS — G8929 Other chronic pain: Secondary | ICD-10-CM | POA: Diagnosis not present

## 2018-01-11 DIAGNOSIS — I10 Essential (primary) hypertension: Secondary | ICD-10-CM | POA: Diagnosis not present

## 2018-01-11 DIAGNOSIS — E669 Obesity, unspecified: Secondary | ICD-10-CM | POA: Diagnosis not present

## 2018-01-11 NOTE — Progress Notes (Signed)
Agree with management.  Carmen Bramhall J Camiah Humm, MD  

## 2018-01-12 DIAGNOSIS — G8929 Other chronic pain: Secondary | ICD-10-CM | POA: Diagnosis not present

## 2018-01-12 DIAGNOSIS — I872 Venous insufficiency (chronic) (peripheral): Secondary | ICD-10-CM | POA: Diagnosis not present

## 2018-01-12 DIAGNOSIS — I70203 Unspecified atherosclerosis of native arteries of extremities, bilateral legs: Secondary | ICD-10-CM | POA: Diagnosis not present

## 2018-01-12 DIAGNOSIS — I10 Essential (primary) hypertension: Secondary | ICD-10-CM | POA: Diagnosis not present

## 2018-01-12 DIAGNOSIS — M545 Low back pain: Secondary | ICD-10-CM | POA: Diagnosis not present

## 2018-01-12 DIAGNOSIS — J449 Chronic obstructive pulmonary disease, unspecified: Secondary | ICD-10-CM | POA: Diagnosis not present

## 2018-01-19 ENCOUNTER — Telehealth (INDEPENDENT_AMBULATORY_CARE_PROVIDER_SITE_OTHER): Payer: Self-pay | Admitting: Radiology

## 2018-01-19 NOTE — Telephone Encounter (Signed)
Approved for monovisc injection. No answer. Previous 12/28/17 appointment was canceled but no rescheduled.

## 2018-01-31 ENCOUNTER — Other Ambulatory Visit: Payer: Self-pay | Admitting: Internal Medicine

## 2018-01-31 ENCOUNTER — Other Ambulatory Visit: Payer: Self-pay | Admitting: General Practice

## 2018-01-31 MED ORDER — WARFARIN SODIUM 5 MG PO TABS: ORAL_TABLET | ORAL | 0 refills | Status: DC

## 2018-02-11 ENCOUNTER — Encounter: Payer: Self-pay | Admitting: Internal Medicine

## 2018-02-20 ENCOUNTER — Ambulatory Visit: Payer: Medicare Other

## 2018-03-13 ENCOUNTER — Encounter: Payer: Self-pay | Admitting: Internal Medicine

## 2018-03-18 ENCOUNTER — Other Ambulatory Visit: Payer: Self-pay | Admitting: Internal Medicine

## 2018-03-19 ENCOUNTER — Other Ambulatory Visit: Payer: Self-pay | Admitting: General Practice

## 2018-03-19 ENCOUNTER — Other Ambulatory Visit: Payer: Self-pay | Admitting: Internal Medicine

## 2018-03-19 MED ORDER — WARFARIN SODIUM 5 MG PO TABS: ORAL_TABLET | ORAL | 0 refills | Status: DC

## 2018-04-08 ENCOUNTER — Other Ambulatory Visit: Payer: Self-pay | Admitting: Internal Medicine

## 2018-04-16 NOTE — Progress Notes (Signed)
Subjective:    Patient ID: [name], female    DOB: [birth year], 62 y.o.   MRN: [id number]  HPI The patient is here for follow up.  Hypothyroidism:  She is taking her medication daily.  She denies any recent changes in energy or weight that are unexplained.   B/l Leg edema due to chronic venous insufficiency:  She is wearing compression socks daily.  She follows with vascular surgery.  She feels her leg swelling is stable.  She does not want to take a water pill-sure urinates enough.  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  She follows with Jenny Reichmann for her INR checks.  Chronic knee and back pain: Her leg pain is likely multifactorial-somewhat vascular and somewhat orthopedic.  She has had recurrent falls.  She has done PT in the past and is interested in doing it again.  It did help.    Severe b/l Knee OA:  She has severe pain and is limited by her pain.  She is seeing dr Sharol Given.  She had injections recently.  He wants her to lose weight.  She is unsure if she wants to have surgery, but cannot consider surgery until she loses weight.    She feels a little depressed. She used to follow with psych and a therapist.  None of the medications worked.  She does not feel she needs medication at this time.  Her daughter is currently living with her and this is not a good situation.  Most likely she will be moving out in the next few weeks and she thinks that will help her depression significantly.  She is also depressed because of her chronic pain and limitations physically.  B12 deficiency: She was experiencing numbness and tingling in her hands and we checked her B12 level 6 months ago and it was low.  She is taking B12 daily.   Medications and allergies reviewed with patient and updated if appropriate.  Patient Active Problem List   Diagnosis Date Noted  . Mixed incontinence 11/10/2017  . B12 deficiency 10/12/2017  . Microscopic  hematuria 10/12/2017  . Hyperglycemia 10/10/2017  . Numbness and tingling 10/10/2017  . Lower abdominal pain 10/10/2017  . Positive colorectal cancer screening using Cologuard test   . Right knee pain 11/18/2016  . Female bladder prolapse 10/10/2016  . Long term current use of anticoagulant 10/10/2016  . Falls frequently 07/14/2016  . Syncope 07/14/2016  . Generalized weakness   . Hyperlipidemia 06/10/2014  . Severe obesity (BMI >= 40) ([place]) 06/10/2014  . CAD (coronary artery disease)   . Interstitial cystitis 01/31/2013  . Chronic narcotic dependence ([place]) 01/30/2013  . S/P hip replacement 11/01/2012  . Venous insufficiency (chronic) (peripheral)   . Atherosclerosis of native arteries of the extremities with ulceration ([place]) 07/21/2011  . Esophageal spasm 06/14/2011  . Visual floaters 05/31/2011  . Constipation 05/12/2011  . Hypothyroidism 01/19/2010  . LEG CRAMPS, NOCTURNAL 01/18/2010  . Bilateral leg edema 01/18/2010  . History of pulmonary embolism 08/10/2007  . 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 LOW BACK PAIN, CHRONIC 08/10/2007  . MEDIASTINAL ADENOPATHY CASTLEMANS D 08/10/2007    Current Outpatient Medications on File Prior to Visit  Medication Sig Dispense Refill  . bisacodyl (BISACODYL LAXATIVE) 5 MG EC tablet Take 30 mg by mouth daily as needed for moderate constipation.    . calcium carbonate (TUMS - DOSED IN MG ELEMENTAL CALCIUM) 500 MG chewable tablet  Chew 1 tablet by mouth as needed for indigestion or heartburn.    Marland Kitchen HYDROmorphone (DILAUDID) 4 MG tablet TAKE 1 TO 2 TABLETS BY MOUTH EVERY 6 HOURS AS NEEDED FOR BREAKTHROUGH PAIN  0  . hyoscyamine (LEVSIN SL) 0.125 MG SL tablet Place 1 tablet (0.125 mg total) under the tongue as directed. Take 1-2 daily as needed. 30 tablet 0  . levothyroxine (SYNTHROID) 200 MCG tablet Take 1 tablet (200 mcg total) by mouth daily before breakfast. 90 tablet 1  . levothyroxine (SYNTHROID, LEVOTHROID) 25 MCG tablet TAKE 1 TAB BY  MOUTH DAILY BEFORE BREAKFAST. TAKE IN ADDITION TO 200 MCG TOTALING 225 MCG DAILY 90 tablet 1  . methadone (DOLOPHINE) 10 MG tablet Take 10 mg by mouth every 8 (eight) hours as needed. for pain  0  . warfarin (COUMADIN) 5 MG tablet Take 1 tablet daily except 2 tablets on Mon Wed Fri or AS DIRECTED BY ANTICOAGULATION CLINIC 40 tablet 0   No current facility-administered medications on file prior to visit.     Past Medical History:  Diagnosis Date  . Anemia, unspecified   . Anxiety   . Arthritis    "about q joint i've got" (07/14/2016)  . Benign neoplasm of colon 12/2007   Hyperplastic colon polyps  . CAD (coronary artery disease)    minimal catheterization, October 2008  . Cellulitis   . Chest pain    with stress  . Childhood asthma   . Chronic low back pain   . Constipation    and diarrhea chronic..Dr. Alben Spittle  . Depression   . Diverticulosis of colon (without mention of hemorrhage)   . DVT (deep venous thrombosis) (HCC) 1990s   LLE  . Family history of adverse reaction to anesthesia    "daughter died having her 1st child cause she got too much anesthesia"   . GERD (gastroesophageal reflux disease)   . History of blood transfusion 01/2008   "related to hip OR"  . 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 Hypopotassemia   . Hypotension, unspecified   . Leg pain   . 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  . Methadone adverse reaction    for chronic leg and back pain  . Other pulmonary embolism and infarction 2008   Significant 2008 and coumadin therapy RV dysfunction...echo...2008..EF 50%..right ventricle markedly dilated w marked right ventricular dysfunc and moder tricuspid regurg/echo..March 2010, Ef 50%, mild dilation of right ventricule w mild decrease right ventric function   . Peripheral vascular disease ([place])   . Rectus sheath hematoma 07/13/2012   On coumadin   . Right bundle branch block     intermittent  . Thyroid disease   . Tobacco use disorder   . Urinary incontinence   . Venous insufficiency (chronic) (peripheral)    Patient's legs were wrapped 2013    Past Surgical History:  Procedure Laterality Date  . CARDIAC CATHETERIZATION  05/2007   Archie Endo 12/08/2010  . CHOLECYSTECTOMY OPEN    . COLONOSCOPY WITH PROPOFOL N/A 08/11/2017   Procedure: COLONOSCOPY WITH PROPOFOL;  Surgeon: Doran Stabler, MD;  Location: [facility];  Service: Gastroenterology;  Laterality: N/A;  . GASTRIC BYPASS  1980s  . JOINT REPLACEMENT    . KNEE ARTHROSCOPY Left 05/2001   Archie Endo 12/21/2010  . SHOULDER ARTHROSCOPY W/ ROTATOR CUFF REPAIR Right 08/2002   Archie Endo 12/21/2010  . TONSILLECTOMY    . TOTAL HIP ARTHROPLASTY  Right 01/2008    Social History   Socioeconomic History  . Marital status: Divorced    Spouse name: Not on file  . Number of children: 1  . Years of education: Not on file  . Highest education level: Not on file  Occupational History  . Occupation: Disabled    Fish farm manager: UNEMPLOYED  Social Needs  . Financial resource strain: Not hard at all  . Food insecurity:    Worry: Never true    Inability: Never true  . Transportation needs:    Medical: No    Non-medical: No  Tobacco Use  . Smoking status: Former Smoker    Packs/day: 0.10    Years: 27.00    Pack years: 2.70    Types: Cigarettes    Last attempt to quit: 06/09/2011    Years since quitting: 6.8  . Smokeless tobacco: Never Used  Substance and Sexual Activity  . Alcohol use: No    Alcohol/week: 0.0 standard drinks    Comment: 07/14/2016 "nothing since the early 1990s"  . Drug use: No  . Sexual activity: Not Currently  Lifestyle  . Physical activity:    Days per week: 0 days    Minutes per session: 0 min  . Stress: To some extent  Relationships  . Social connections:    Talks on phone: More than three times a week    Gets together: More than three times a week    Attends religious service: 1 to 4 times per  year    Active member of club or organization: No    Attends meetings of clubs or organizations: Never    Relationship status: Divorced  Other Topics Concern  . Not on file  Social History Narrative   Current smoker wi last 12 mos.     Family History  Problem Relation Age of Onset  . Heart disease Mother   . Heart disease Father   . Crohn's disease Sister     Review of Systems  Constitutional: Negative for chills and fever.  Respiratory: Positive for shortness of breath (with exertion only). Negative for cough and wheezing.   Cardiovascular: Positive for leg swelling. Negative for chest pain and palpitations.  Neurological: Positive for headaches. Negative for light-headedness.  Psychiatric/Behavioral: Positive for dysphoric mood (does not want medication). Negative for suicidal ideas.       Objective:  There were no vitals filed for this visit. BP Readings from Last 3 Encounters:  04/17/18 108/64  12/07/17 113/75  10/10/17 130/72   Wt Readings from Last 3 Encounters:  04/17/18 (!) 320 lb (145.2 kg)  12/07/17 (!) 312 lb (141.5 kg)  10/10/17 (!) 318 lb (144.2 kg)   There is no height or weight on file to calculate BMI.   Physical Exam    Constitutional: Appears well-developed and well-nourished. No distress.  HENT:  Head: Normocephalic and atraumatic.  Neck: Neck supple. No tracheal deviation present. No thyromegaly present.  No cervical lymphadenopathy Cardiovascular: Normal rate, regular rhythm and normal heart sounds.   No murmur heard. No carotid bruit .  2+ b/l LE  edema Pulmonary/Chest: Effort normal and breath sounds normal. No respiratory distress. No has no wheezes. No rales.  Skin: Skin is warm and dry. Not diaphoretic.  Psychiatric: Normal mood and affect. Behavior is normal.      Assessment & Plan:    See Problem List for Assessment and Plan of chronic medical problems.

## 2018-04-16 NOTE — Patient Instructions (Addendum)
  Test(s) ordered today. Your results will be released to MyChart (or called to you) after review, usually within 72hours after test completion. If any changes need to be made, you will be notified at that same time.  Flu immunization administered today.    Medications reviewed and updated.  No changes recommended at this time.    Please followup in 6months   

## 2018-04-16 NOTE — Progress Notes (Addendum)
Subjective:   [name] is a 62 y.o. female who presents for Medicare Annual (Subsequent) preventive examination.  Review of Systems:  No ROS.  Medicare Wellness Visit. Additional risk factors are reflected in the social history.  Cardiac Risk Factors include: dyslipidemia;hypertension;obesity (BMI >30kg/m2);sedentary lifestyle;advanced age (>75men, >56 women) Sleep patterns: gets up 1-2 times nightly to void and sleeps 6-7 hours nightly.    Home Safety/Smoke Alarms: Feels safe in home. Smoke alarms in place.  Living environment; residence and Adult nurse: apartment, equipment: Radio producer, Type: Single Point Sledge and Walkers, Type: Conservation officer, nature, no firearms. Seat Belt Safety/Bike Helmet: Wears seat belt.Lives alone, no needs for DME, good support system     Objective:     Vitals: BP 108/64   Pulse 65   Temp 98.4 F (36.9 C)   Resp 18   Ht 5\' 5"  (1.651 m)   Wt (!) 320 lb (145.2 kg)   SpO2 98%   BMI 53.25 kg/m   Body mass index is 53.25 kg/m.  Advanced Directives 04/17/2018 12/07/2017 10/09/2017 08/11/2017 07/13/2016 10/10/2015 07/19/2015  Does Patient Have a Medical Advance Directive? Yes Yes Yes No No No No  Type of Paramedic of [place];Living will [place];Living will [place];Living will - - - -  Copy of [place] in Chart? No - copy requested - - - - - -  Would patient like information on creating a medical advance directive? - - - - No - Patient declined No - patient declined information No - patient declined information    Tobacco Social History   Tobacco Use  Smoking Status Former Smoker  . Packs/day: 0.10  . Years: 27.00  . Pack years: 2.70  . Types: Cigarettes  . Last attempt to quit: 06/09/2011  . Years since quitting: 6.8  Smokeless Tobacco Never Used     Counseling given: Not Answered  Past Medical History:  Diagnosis Date  . Anemia, unspecified   . Anxiety   .  Arthritis    "about q joint i've got" (07/14/2016)  . Benign neoplasm of colon 12/2007   Hyperplastic colon polyps  . CAD (coronary artery disease)    minimal catheterization, October 2008  . Cellulitis   . Chest pain    with stress  . Childhood asthma   . Chronic low back pain   . Constipation    and diarrhea chronic..Dr. Alben Spittle  . Depression   . Diverticulosis of colon (without mention of hemorrhage)   . DVT (deep venous thrombosis) (HCC) 1990s   LLE  . Family history of adverse reaction to anesthesia    "daughter died having her 1st child cause she got too much anesthesia"   . GERD (gastroesophageal reflux disease)   . History of blood transfusion 01/2008   "related to hip OR"  . 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 Hypopotassemia   . Hypotension, unspecified   . Leg pain   . 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  . Methadone adverse reaction    for chronic leg and back pain  . Other pulmonary embolism and infarction 2008   Significant 2008 and coumadin therapy RV dysfunction...echo...2008..EF 50%..right ventricle markedly dilated w marked right ventricular dysfunc and moder tricuspid regurg/echo..March 2010, Ef 50%, mild dilation of right ventricule w mild decrease right ventric function   . Peripheral vascular disease ([place])   .  Rectus sheath hematoma 07/13/2012   On coumadin   . Right bundle branch block    intermittent  . Thyroid disease   . Tobacco use disorder   . Urinary incontinence   . Venous insufficiency (chronic) (peripheral)    Patient's legs were wrapped 2013   Past Surgical History:  Procedure Laterality Date  . CARDIAC CATHETERIZATION  05/2007   Archie Endo 12/08/2010  . CHOLECYSTECTOMY OPEN    . COLONOSCOPY WITH PROPOFOL N/A 08/11/2017   Procedure: COLONOSCOPY WITH PROPOFOL;  Surgeon: Doran Stabler, MD;  Location: [facility];  Service: Gastroenterology;  Laterality: N/A;  .  GASTRIC BYPASS  1980s  . JOINT REPLACEMENT    . KNEE ARTHROSCOPY Left 05/2001   Archie Endo 12/21/2010  . SHOULDER ARTHROSCOPY W/ ROTATOR CUFF REPAIR Right 08/2002   Archie Endo 12/21/2010  . TONSILLECTOMY    . TOTAL HIP ARTHROPLASTY Right 01/2008   Family History  Problem Relation Age of Onset  . Heart disease Mother   . Heart disease Father   . Crohn's disease Sister    Social History   Socioeconomic History  . Marital status: Divorced    Spouse name: Not on file  . Number of children: 1  . Years of education: Not on file  . Highest education level: Not on file  Occupational History  . Occupation: Disabled    Fish farm manager: UNEMPLOYED  Social Needs  . Financial resource strain: Not hard at all  . Food insecurity:    Worry: Never true    Inability: Never true  . Transportation needs:    Medical: No    Non-medical: No  Tobacco Use  . Smoking status: Former Smoker    Packs/day: 0.10    Years: 27.00    Pack years: 2.70    Types: Cigarettes    Last attempt to quit: 06/09/2011    Years since quitting: 6.8  . Smokeless tobacco: Never Used  Substance and Sexual Activity  . Alcohol use: No    Alcohol/week: 0.0 standard drinks    Comment: 07/14/2016 "nothing since the early 1990s"  . Drug use: No  . Sexual activity: Not Currently  Lifestyle  . Physical activity:    Days per week: 0 days    Minutes per session: 0 min  . Stress: To some extent  Relationships  . Social connections:    Talks on phone: More than three times a week    Gets together: More than three times a week    Attends religious service: 1 to 4 times per year    Active member of club or organization: No    Attends meetings of clubs or organizations: Never    Relationship status: Divorced  Other Topics Concern  . Not on file  Social History Narrative   Current smoker wi last 12 mos.     Outpatient Encounter Medications as of 04/17/2018  Medication Sig  . bisacodyl (BISACODYL LAXATIVE) 5 MG EC tablet Take 30 mg  by mouth daily as needed for moderate constipation.  . calcium carbonate (TUMS - DOSED IN MG ELEMENTAL CALCIUM) 500 MG chewable tablet Chew 1 tablet by mouth as needed for indigestion or heartburn.  Marland Kitchen HYDROmorphone (DILAUDID) 4 MG tablet TAKE 1 TO 2 TABLETS BY MOUTH EVERY 6 HOURS AS NEEDED FOR BREAKTHROUGH PAIN  . hyoscyamine (LEVSIN SL) 0.125 MG SL tablet Place 1 tablet (0.125 mg total) under the tongue as directed. Take 1-2 daily as needed.  Marland Kitchen levothyroxine (SYNTHROID) 200 MCG tablet Take 1 tablet (  200 mcg total) by mouth daily before breakfast.  . levothyroxine (SYNTHROID, LEVOTHROID) 25 MCG tablet TAKE 1 TAB BY MOUTH DAILY BEFORE BREAKFAST. TAKE IN ADDITION TO 200 MCG TOTALING 225 MCG DAILY  . methadone (DOLOPHINE) 10 MG tablet Take 10 mg by mouth every 8 (eight) hours as needed. for pain  . warfarin (COUMADIN) 5 MG tablet Take 1 tablet daily except 2 tablets on Mon Wed Fri or AS DIRECTED BY ANTICOAGULATION CLINIC   No facility-administered encounter medications on file as of 04/17/2018.     Activities of Daily Living In your present state of health, do you have any difficulty performing the following activities: 04/17/2018  Hearing? N  Vision? N  Difficulty concentrating or making decisions? N  Walking or climbing stairs? Y  Dressing or bathing? Y  Doing errands, shopping? Y  Preparing Food and eating ? Y  Using the Toilet? N  In the past six months, have you accidently leaked urine? N  Do you have problems with loss of bowel control? N  Managing your Medications? N  Managing your Finances? N  Housekeeping or managing your Housekeeping? Y  Some recent data might be hidden    Patient Care Team: Binnie Rail, MD as PCP - General (Internal Medicine) Wayland Salinas, MD as Referring Physician (Neurosurgery) Newt Minion, MD as Consulting Physician (Orthopedic Surgery) Myrlene Broker, MD as Attending Physician (Urology) Loletha Carrow Kirke Corin, MD as Consulting Physician  (Gastroenterology)    Assessment:   This is a routine wellness examination for Aneita. Physical assessment deferred to PCP.   Exercise Activities and Dietary recommendations Current Exercise Habits: The patient does not participate in regular exercise at present(chair exercise print-out provided), Exercise limited by: orthopedic condition(s)  Diet (meal preparation, eat out, water intake, caffeinated beverages, dairy products, fruits and vegetables): in general, a "healthy" diet     Reviewed heart healthy diet. Encouraged patient to increase daily water and healthy fluid intake. Discussed weight loss strategies. Relevant patient education assigned to patient using Emmi.  Goals    . Patient Stated     Increase my physical activity by doing chair exercises and looking into doing water aerobics.      Depression Screen PHQ 2/9 Scores 04/17/2018 12/19/2012  PHQ - 2 Score 4 0  PHQ- 9 Score 8 -     Cognitive Function       Ad8 score reviewed for issues:  Issues making decisions: no  Less interest in hobbies / activities: no  Repeats questions, stories (family complaining): no  Trouble using ordinary gadgets (microwave, computer, phone):no  Forgets the month or year: no  Mismanaging finances: no  Remembering appts: no  Daily problems with thinking and/or memory: no Ad8 score is= 0  Immunization History  Administered Date(s) Administered  . Influenza Split 04/22/2011  . Influenza,inj,Quad PF,6+ Mos 06/03/2013, 06/10/2014, 07/15/2016, 04/12/2017  . Pneumococcal Conjugate-13 06/10/2014  . Tdap 06/22/2012   Screening Tests Health Maintenance  Topic Date Due  . INFLUENZA VACCINE  03/08/2018  . MAMMOGRAM  08/11/2018  . PAP SMEAR  08/09/2019  . Fecal DNA (Cologuard)  06/01/2020  . TETANUS/TDAP  06/22/2022  . Hepatitis C Screening  Completed  . HIV Screening  Completed      Plan:      Continue doing brain stimulating activities (puzzles, reading, adult coloring  books, staying active) to keep memory sharp.   Continue to eat heart healthy diet (full of fruits, vegetables, whole grains, lean protein, water--limit salt,  fat, and sugar intake) and increase physical activity as tolerated.  I have personally reviewed and noted the following in the patient's chart:   . Medical and social history . Use of alcohol, tobacco or illicit drugs  . Current medications and supplements . Functional ability and status . Nutritional status . Physical activity . Advanced directives . List of other physicians . Vitals . Screenings to include cognitive, depression, and falls . Referrals and appointments  In addition, I have reviewed and discussed with patient certain preventive protocols, quality metrics, and best practice recommendations. A written personalized care plan for preventive services as well as general preventive health recommendations were provided to patient.     Michiel Cowboy, RN  04/17/2018   Medical screening examination/treatment/procedure(s) were performed by non-physician practitioner and as supervising physician I was immediately available for consultation/collaboration. I agree with above. Binnie Rail, MD

## 2018-04-17 ENCOUNTER — Other Ambulatory Visit (INDEPENDENT_AMBULATORY_CARE_PROVIDER_SITE_OTHER): Payer: Medicare Other

## 2018-04-17 ENCOUNTER — Encounter: Payer: Self-pay | Admitting: Internal Medicine

## 2018-04-17 ENCOUNTER — Ambulatory Visit (INDEPENDENT_AMBULATORY_CARE_PROVIDER_SITE_OTHER): Payer: Medicare Other | Admitting: Internal Medicine

## 2018-04-17 ENCOUNTER — Ambulatory Visit (INDEPENDENT_AMBULATORY_CARE_PROVIDER_SITE_OTHER): Payer: Medicare Other | Admitting: General Practice

## 2018-04-17 ENCOUNTER — Ambulatory Visit (INDEPENDENT_AMBULATORY_CARE_PROVIDER_SITE_OTHER): Payer: Medicare Other | Admitting: *Deleted

## 2018-04-17 VITALS — BP 108/64 | HR 65 | Temp 98.4°F | Resp 18 | Ht 65.0 in | Wt 320.0 lb

## 2018-04-17 VITALS — BP 108/64 | HR 65 | Temp 98.4°F | Resp 19 | Ht 65.0 in | Wt 320.0 lb

## 2018-04-17 DIAGNOSIS — E782 Mixed hyperlipidemia: Secondary | ICD-10-CM

## 2018-04-17 DIAGNOSIS — E038 Other specified hypothyroidism: Secondary | ICD-10-CM

## 2018-04-17 DIAGNOSIS — F3289 Other specified depressive episodes: Secondary | ICD-10-CM

## 2018-04-17 DIAGNOSIS — G8929 Other chronic pain: Secondary | ICD-10-CM

## 2018-04-17 DIAGNOSIS — Z0000 Encounter for general adult medical examination without abnormal findings: Secondary | ICD-10-CM | POA: Diagnosis not present

## 2018-04-17 DIAGNOSIS — M545 Low back pain: Secondary | ICD-10-CM

## 2018-04-17 DIAGNOSIS — Z7901 Long term (current) use of anticoagulants: Secondary | ICD-10-CM | POA: Diagnosis not present

## 2018-04-17 DIAGNOSIS — R739 Hyperglycemia, unspecified: Secondary | ICD-10-CM

## 2018-04-17 DIAGNOSIS — F32A Depression, unspecified: Secondary | ICD-10-CM | POA: Insufficient documentation

## 2018-04-17 DIAGNOSIS — E538 Deficiency of other specified B group vitamins: Secondary | ICD-10-CM | POA: Diagnosis not present

## 2018-04-17 DIAGNOSIS — M25561 Pain in right knee: Secondary | ICD-10-CM

## 2018-04-17 DIAGNOSIS — Z86711 Personal history of pulmonary embolism: Secondary | ICD-10-CM

## 2018-04-17 DIAGNOSIS — M170 Bilateral primary osteoarthritis of knee: Secondary | ICD-10-CM

## 2018-04-17 DIAGNOSIS — R600 Localized edema: Secondary | ICD-10-CM

## 2018-04-17 DIAGNOSIS — F329 Major depressive disorder, single episode, unspecified: Secondary | ICD-10-CM | POA: Insufficient documentation

## 2018-04-17 DIAGNOSIS — E039 Hypothyroidism, unspecified: Secondary | ICD-10-CM

## 2018-04-17 DIAGNOSIS — Z23 Encounter for immunization: Secondary | ICD-10-CM | POA: Diagnosis not present

## 2018-04-17 LAB — CBC WITH DIFFERENTIAL/PLATELET
Basophils Absolute: 0 10*3/uL (ref 0.0–0.1)
Basophils Relative: 0.5 % (ref 0.0–3.0)
EOS PCT: 1.6 % (ref 0.0–5.0)
Eosinophils Absolute: 0.1 10*3/uL (ref 0.0–0.7)
HCT: 36.8 % (ref 36.0–46.0)
HEMOGLOBIN: 12.4 g/dL (ref 12.0–15.0)
Lymphocytes Relative: 19.4 % (ref 12.0–46.0)
Lymphs Abs: 1 10*3/uL (ref 0.7–4.0)
MCHC: 33.8 g/dL (ref 30.0–36.0)
MCV: 85.2 fl (ref 78.0–100.0)
MONO ABS: 0.4 10*3/uL (ref 0.1–1.0)
MONOS PCT: 7.2 % (ref 3.0–12.0)
Neutro Abs: 3.6 10*3/uL (ref 1.4–7.7)
Neutrophils Relative %: 71.3 % (ref 43.0–77.0)
Platelets: 185 10*3/uL (ref 150.0–400.0)
RBC: 4.33 Mil/uL (ref 3.87–5.11)
RDW: 14.9 % (ref 11.5–15.5)
WBC: 5 10*3/uL (ref 4.0–10.5)

## 2018-04-17 LAB — COMPREHENSIVE METABOLIC PANEL
ALBUMIN: 3.6 g/dL (ref 3.5–5.2)
ALK PHOS: 86 U/L (ref 39–117)
ALT: 11 U/L (ref 0–35)
AST: 16 U/L (ref 0–37)
BUN: 6 mg/dL (ref 6–23)
CALCIUM: 9.1 mg/dL (ref 8.4–10.5)
CO2: 33 mEq/L — ABNORMAL HIGH (ref 19–32)
Chloride: 102 mEq/L (ref 96–112)
Creatinine, Ser: 0.68 mg/dL (ref 0.40–1.20)
GFR: 92.49 mL/min (ref >60.00)
GLUCOSE: 94 mg/dL (ref 70–99)
POTASSIUM: 3.5 meq/L (ref 3.5–5.1)
Sodium: 140 mEq/L (ref 135–145)
Total Bilirubin: 0.4 mg/dL (ref 0.2–1.2)
Total Protein: 7.7 g/dL (ref 6.0–8.3)

## 2018-04-17 LAB — LIPID PANEL
Cholesterol: 123 mg/dL (ref 0–200)
HDL: 28.8 mg/dL — AB (ref >39.00)
LDL CALC: 69 mg/dL (ref 0–99)
NonHDL: 94.1
Total CHOL/HDL Ratio: 4
Triglycerides: 128 mg/dL (ref 0.0–149.0)
VLDL: 25.6 mg/dL (ref 0.0–40.0)

## 2018-04-17 LAB — VITAMIN B12: VITAMIN B 12: 1408 pg/mL — AB (ref 211–911)

## 2018-04-17 LAB — POCT INR: INR: 7.5 — AB (ref 2.0–3.0)

## 2018-04-17 LAB — TSH: TSH: 2.36 u[IU]/mL (ref 0.35–4.50)

## 2018-04-17 LAB — HEMOGLOBIN A1C: HEMOGLOBIN A1C: 5.5 % (ref 4.6–6.5)

## 2018-04-17 MED ORDER — VITAMIN B-12 1000 MCG PO TABS: 1000.0000 ug | ORAL_TABLET | Freq: Every day | ORAL | Status: DC

## 2018-04-17 NOTE — Progress Notes (Signed)
Agree with management.  Carmen Maruyama J Eliud Polo, MD  

## 2018-04-17 NOTE — Assessment & Plan Note (Signed)
Taking B12 daily Recheck B12 level

## 2018-04-17 NOTE — Assessment & Plan Note (Signed)
Hyperglycemia in the past Check A1c Continue weight loss efforts

## 2018-04-17 NOTE — Assessment & Plan Note (Signed)
Following with orthopedics-Dr. Sharol Given Has been getting injections, which helped temporarily Working on weight loss-may consider surgery She is interested in doing physical therapy, which has helped in the past-Home PT ordered since she does not drive and is unsafe to ambulate long distances

## 2018-04-17 NOTE — Patient Instructions (Addendum)
Pre visit review using our clinic review tool, if applicable. No additional management support is needed unless otherwise documented below in the visit note.  Hold coumadin today, tomorrow and Thursday.  Re-check INR on Friday.  Go to ER if any unusual bleeding occurs.

## 2018-04-17 NOTE — Patient Instructions (Addendum)
Continue doing brain stimulating activities (puzzles, reading, adult coloring books, staying active) to keep memory sharp.   Continue to eat heart healthy diet (full of fruits, vegetables, whole grains, lean protein, water--limit salt, fat, and sugar intake) and increase physical activity as tolerated.  Ms. Prezioso , Thank you for taking time to come for your Medicare Wellness Visit. I appreciate your ongoing commitment to your health goals. Please review the following plan we discussed and let me know if I can assist you in the future.   These are the goals we discussed: Goals    . Patient Stated     Increase my physical activity by doing chair exercises and looking into doing water aerobics.       This is a list of the screening recommended for you and due dates:  Health Maintenance  Topic Date Due  . Flu Shot  03/08/2018  . Mammogram  08/11/2018  . Pap Smear  08/09/2019  . Cologuard (Stool DNA test)  06/01/2020  . Tetanus Vaccine  06/22/2022  .  Hepatitis C: One time screening is recommended by Center for Disease Control  (CDC) for  adults born from [birth year] through 1967.   Completed  . HIV Screening  Completed   It is important to avoid accidents which may result in broken bones.  Here are a few ideas on how to make your home safer so you will be less likely to trip or fall.  1. Use nonskid mats or non slip strips in your shower or tub, on your bathroom floor and around sinks.  If you know that you have spilled water, wipe it up! 2. In the bathroom, it is important to have properly installed grab bars on the walls or on the edge of the tub.  Towel racks are NOT strong enough for you to hold onto or to pull on for support. 3. Stairs and hallways should have enough light.  Add lamps or night lights if you need ore light. 4. It is good to have handrails on both sides of the stairs if possible.  Always fix broken handrails right away. 5. It is important to see the edges of steps.  Paint  the edges of outdoor steps white so you can see them better.  Put colored tape on the edge of inside steps. 6. Throw-rugs are dangerous because they can slide.  Removing the rugs is the best idea, but if they must stay, add adhesive carpet tape to prevent slipping. 7. Do not keep things on stairs or in the halls.  Remove small furniture that blocks the halls as it may cause you to trip.  Keep telephone and electrical cords out of the way where you walk. 8. Always were sturdy, rubber-soled shoes for good support.  Never wear just socks, especially on the stairs.  Socks may cause you to slip or fall.  Do not wear full-length housecoats as you can easily trip on the bottom.  9. Place the things you use the most on the shelves that are the easiest to reach.  If you use a stepstool, make sure it is in good condition.  If you feel unsteady, DO NOT climb, ask for help. If a health professional advises you to use a cane or walker, do not be ashamed.  These items can keep you from falling and breaking your bones.Health Maintenance, Female Adopting a healthy lifestyle and getting preventive care can go a long way to promote health and wellness. Talk with your  health care provider about what schedule of regular examinations is right for you. This is a good chance for you to check in with your provider about disease prevention and staying healthy. In between checkups, there are plenty of things you can do on your own. Experts have done a lot of research about which lifestyle changes and preventive measures are most likely to keep you healthy. Ask your health care provider for more information. Weight and diet Eat a healthy diet  Be sure to include plenty of vegetables, fruits, low-fat dairy products, and lean protein.  Do not eat a lot of foods high in solid fats, added sugars, or salt.  Get regular exercise. This is one of the most important things you can do for your health. ? Most adults should exercise for  at least 150 minutes each week. The exercise should increase your heart rate and make you sweat (moderate-intensity exercise). ? Most adults should also do strengthening exercises at least twice a week. This is in addition to the moderate-intensity exercise.  Maintain a healthy weight  Body mass index (BMI) is a measurement that can be used to identify possible weight problems. It estimates body fat based on height and weight. Your health care provider can help determine your BMI and help you achieve or maintain a healthy weight.  For females 70 years of age and older: ? A BMI below 18.5 is considered underweight. ? A BMI of 18.5 to 24.9 is normal. ? A BMI of 25 to 29.9 is considered overweight. ? A BMI of 30 and above is considered obese.  Watch levels of cholesterol and blood lipids  You should start having your blood tested for lipids and cholesterol at 64 years of age, then have this test every 5 years.  You may need to have your cholesterol levels checked more often if: ? Your lipid or cholesterol levels are high. ? You are older than 64 years of age. ? You are at high risk for heart disease.  Cancer screening Lung Cancer  Lung cancer screening is recommended for adults 49-25 years old who are at high risk for lung cancer because of a history of smoking.  A yearly low-dose CT scan of the lungs is recommended for people who: ? Currently smoke. ? Have quit within the past 15 years. ? Have at least a 30-pack-year history of smoking. A pack year is smoking an average of one pack of cigarettes a day for 1 year.  Yearly screening should continue until it has been 15 years since you quit.  Yearly screening should stop if you develop a health problem that would prevent you from having lung cancer treatment.  Breast Cancer  Practice breast self-awareness. This means understanding how your breasts normally appear and feel.  It also means doing regular breast self-exams. Let your  health care provider know about any changes, no matter how small.  If you are in your 20s or 30s, you should have a clinical breast exam (CBE) by a health care provider every 1-3 years as part of a regular health exam.  If you are 50 or older, have a CBE every year. Also consider having a breast X-ray (mammogram) every year.  If you have a family history of breast cancer, talk to your health care provider about genetic screening.  If you are at high risk for breast cancer, talk to your health care provider about having an MRI and a mammogram every year.  Breast cancer gene (BRCA)  assessment is recommended for women who have family members with BRCA-related cancers. BRCA-related cancers include: ? Breast. ? Ovarian. ? Tubal. ? Peritoneal cancers.  Results of the assessment will determine the need for genetic counseling and BRCA1 and BRCA2 testing.  Cervical Cancer Your health care provider may recommend that you be screened regularly for cancer of the pelvic organs (ovaries, uterus, and vagina). This screening involves a pelvic examination, including checking for microscopic changes to the surface of your cervix (Pap test). You may be encouraged to have this screening done every 3 years, beginning at age 70.  For women ages 54-65, health care providers may recommend pelvic exams and Pap testing every 3 years, or they may recommend the Pap and pelvic exam, combined with testing for human papilloma virus (HPV), every 5 years. Some types of HPV increase your risk of cervical cancer. Testing for HPV may also be done on women of any age with unclear Pap test results.  Other health care providers may not recommend any screening for nonpregnant women who are considered low risk for pelvic cancer and who do not have symptoms. Ask your health care provider if a screening pelvic exam is right for you.  If you have had past treatment for cervical cancer or a condition that could lead to cancer, you need  Pap tests and screening for cancer for at least 20 years after your treatment. If Pap tests have been discontinued, your risk factors (such as having a new sexual partner) need to be reassessed to determine if screening should resume. Some women have medical problems that increase the chance of getting cervical cancer. In these cases, your health care provider may recommend more frequent screening and Pap tests.  Colorectal Cancer  This type of cancer can be detected and often prevented.  Routine colorectal cancer screening usually begins at 64 years of age and continues through 63 years of age.  Your health care provider may recommend screening at an earlier age if you have risk factors for colon cancer.  Your health care provider may also recommend using home test kits to check for hidden blood in the stool.  A small camera at the end of a tube can be used to examine your colon directly (sigmoidoscopy or colonoscopy). This is done to check for the earliest forms of colorectal cancer.  Routine screening usually begins at age 33.  Direct examination of the colon should be repeated every 5-10 years through 64 years of age. However, you may need to be screened more often if early forms of precancerous polyps or small growths are found.  Skin Cancer  Check your skin from head to toe regularly.  Tell your health care provider about any new moles or changes in moles, especially if there is a change in a mole's shape or color.  Also tell your health care provider if you have a mole that is larger than the size of a pencil eraser.  Always use sunscreen. Apply sunscreen liberally and repeatedly throughout the day.  Protect yourself by wearing long sleeves, pants, a wide-brimmed hat, and sunglasses whenever you are outside.  Heart disease, diabetes, and high blood pressure  High blood pressure causes heart disease and increases the risk of stroke. High blood pressure is more likely to develop  in: ? People who have blood pressure in the high end of the normal range (130-139/85-89 mm Hg). ? People who are overweight or obese. ? People who are African American.  If you are  90-76 years of age, have your blood pressure checked every 3-5 years. If you are 69 years of age or older, have your blood pressure checked every year. You should have your blood pressure measured twice-once when you are at a hospital or clinic, and once when you are not at a hospital or clinic. Record the average of the two measurements. To check your blood pressure when you are not at a hospital or clinic, you can use: ? An automated blood pressure machine at a pharmacy. ? A home blood pressure monitor.  If you are between 96 years and 40 years old, ask your health care provider if you should take aspirin to prevent strokes.  Have regular diabetes screenings. This involves taking a blood sample to check your fasting blood sugar level. ? If you are at a normal weight and have a low risk for diabetes, have this test once every three years after 64 years of age. ? If you are overweight and have a high risk for diabetes, consider being tested at a younger age or more often. Preventing infection Hepatitis B  If you have a higher risk for hepatitis B, you should be screened for this virus. You are considered at high risk for hepatitis B if: ? You were born in a country where hepatitis B is common. Ask your health care provider which countries are considered high risk. ? Your parents were born in a high-risk country, and you have not been immunized against hepatitis B (hepatitis B vaccine). ? You have HIV or AIDS. ? You use needles to inject street drugs. ? You live with someone who has hepatitis B. ? You have had sex with someone who has hepatitis B. ? You get hemodialysis treatment. ? You take certain medicines for conditions, including cancer, organ transplantation, and autoimmune conditions.  Hepatitis C  Blood  testing is recommended for: ? Everyone born from [birth year] through 1967. ? Anyone with known risk factors for hepatitis C.  Sexually transmitted infections (STIs)  You should be screened for sexually transmitted infections (STIs) including gonorrhea and chlamydia if: ? You are sexually active and are younger than 64 years of age. ? You are older than 64 years of age and your health care provider tells you that you are at risk for this type of infection. ? Your sexual activity has changed since you were last screened and you are at an increased risk for chlamydia or gonorrhea. Ask your health care provider if you are at risk.  If you do not have HIV, but are at risk, it may be recommended that you take a prescription medicine daily to prevent HIV infection. This is called pre-exposure prophylaxis (PrEP). You are considered at risk if: ? You are sexually active and do not regularly use condoms or know the HIV status of your partner(s). ? You take drugs by injection. ? You are sexually active with a partner who has HIV.  Talk with your health care provider about whether you are at high risk of being infected with HIV. If you choose to begin PrEP, you should first be tested for HIV. You should then be tested every 3 months for as long as you are taking PrEP. Pregnancy  If you are premenopausal and you may become pregnant, ask your health care provider about preconception counseling.  If you may become pregnant, take 400 to 800 micrograms (mcg) of folic acid every day.  If you want to prevent pregnancy, talk to your health care  provider about birth control (contraception). Osteoporosis and menopause  Osteoporosis is a disease in which the bones lose minerals and strength with aging. This can result in serious bone fractures. Your risk for osteoporosis can be identified using a bone density scan.  If you are 86 years of age or older, or if you are at risk for osteoporosis and fractures, ask your  health care provider if you should be screened.  Ask your health care provider whether you should take a calcium or vitamin D supplement to lower your risk for osteoporosis.  Menopause may have certain physical symptoms and risks.  Hormone replacement therapy may reduce some of these symptoms and risks. Talk to your health care provider about whether hormone replacement therapy is right for you. Follow these instructions at home:  Schedule regular health, dental, and eye exams.  Stay current with your immunizations.  Do not use any tobacco products including cigarettes, chewing tobacco, or electronic cigarettes.  If you are pregnant, do not drink alcohol.  If you are breastfeeding, limit how much and how often you drink alcohol.  Limit alcohol intake to no more than 1 drink per day for nonpregnant women. One drink equals 12 ounces of beer, 5 ounces of wine, or 1 ounces of hard liquor.  Do not use street drugs.  Do not share needles.  Ask your health care provider for help if you need support or information about quitting drugs.  Tell your health care provider if you often feel depressed.  Tell your health care provider if you have ever been abused or do not feel safe at home. This information is not intended to replace advice given to you by your health care provider. Make sure you discuss any questions you have with your health care provider. Document Released: 02/07/2011 Document Revised: 12/31/2015 Document Reviewed: 04/28/2015 Elsevier Interactive Patient Education  [employer].

## 2018-04-17 NOTE — Assessment & Plan Note (Signed)
History of pulmonary embolism On long-term anticoagulation with warfarin See [name] today for INR check Continue warfarin CBC, CMP

## 2018-04-17 NOTE — Assessment & Plan Note (Addendum)
Wearing compression socks daily Does not want a diuretic-chair urinates enough We will monitor Continue weight loss efforts

## 2018-04-17 NOTE — Assessment & Plan Note (Signed)
Following with neurosurgery for pain management Will refer for physical therapy for chronic lower back pain and knee pain

## 2018-04-17 NOTE — Assessment & Plan Note (Signed)
She does feel depressed, but denies any suicidal ideation Some of her depression is related to her chronic pain and physical limitations A lot of her depression is related to her daughter living with her and hopefully she will be moving out in the next few weeks, which will help significantly She does not want any medication Continue to monitor

## 2018-04-17 NOTE — Assessment & Plan Note (Signed)
Check tsh  Titrate med dose if needed  

## 2018-04-17 NOTE — Assessment & Plan Note (Signed)
Not currently on medication-statin intolerant Check lipid panel, CMP Encouraged regular exercise and weight loss

## 2018-04-18 ENCOUNTER — Telehealth: Payer: Self-pay

## 2018-04-18 NOTE — Telephone Encounter (Signed)
Copied from [place] [id number]. Topic: General - Other >> Apr 18, 2018  1:56 PM Mcneil, Ja-Kwan wrote: Reason for CRM: Pt called to advise that her arm is extremely sore and she can barely raise it. Pt concerned and would like to know if this is normal. Cb# [phone]  Patient recd regular dose flu vaccine yesterday in arm she has called about ---there is no redness, no swelling, no rash, afebrile, no other symptoms---just extreme soreness at injection site---patient advised she can use ice pack and/or mild pain relievers orally---move her arm around as much as possible to help work soreness out---call us back if any other symptoms develop

## 2018-04-20 ENCOUNTER — Ambulatory Visit (INDEPENDENT_AMBULATORY_CARE_PROVIDER_SITE_OTHER): Payer: Medicare Other | Admitting: General Practice

## 2018-04-20 DIAGNOSIS — Z7901 Long term (current) use of anticoagulants: Secondary | ICD-10-CM | POA: Diagnosis not present

## 2018-04-20 DIAGNOSIS — Z86711 Personal history of pulmonary embolism: Secondary | ICD-10-CM

## 2018-04-20 LAB — POCT INR: INR: 2.4 (ref 2.0–3.0)

## 2018-04-20 NOTE — Progress Notes (Signed)
Agree with management.  Carmen Decarolis [name], MD  

## 2018-04-20 NOTE — Patient Instructions (Addendum)
Pre visit review using our clinic review tool, if applicable. No additional management support is needed unless otherwise documented below in the visit note.  Change dosage and and take 1 tablet daily except 2 tablets on Wednesdays.  Re-check in 3 weeks.

## 2018-04-21 ENCOUNTER — Encounter: Payer: Self-pay | Admitting: Internal Medicine

## 2018-05-01 ENCOUNTER — Other Ambulatory Visit: Payer: Self-pay | Admitting: Internal Medicine

## 2018-05-11 ENCOUNTER — Ambulatory Visit (INDEPENDENT_AMBULATORY_CARE_PROVIDER_SITE_OTHER): Payer: Medicare Other | Admitting: General Practice

## 2018-05-11 DIAGNOSIS — Z86711 Personal history of pulmonary embolism: Secondary | ICD-10-CM

## 2018-05-11 DIAGNOSIS — Z7901 Long term (current) use of anticoagulants: Secondary | ICD-10-CM | POA: Diagnosis not present

## 2018-05-11 LAB — POCT INR: INR: 4.5 — AB (ref 2.0–3.0)

## 2018-05-11 NOTE — Progress Notes (Signed)
Agree with management.  Carmen Maroney J Keifer Habib, MD  

## 2018-05-11 NOTE — Patient Instructions (Addendum)
Pre visit review using our clinic review tool, if applicable. No additional management support is needed unless otherwise documented below in the visit note.  Hold coumadin today and tomorrow (10/4 and 10/5) and then take 1 tablet daily.  Re-check in 3 weeks.

## 2018-05-13 ENCOUNTER — Other Ambulatory Visit: Payer: Self-pay | Admitting: Internal Medicine

## 2018-05-14 ENCOUNTER — Telehealth: Payer: Self-pay | Admitting: Internal Medicine

## 2018-05-14 MED ORDER — LEVOTHYROXINE SODIUM 25 MCG PO TABS: ORAL_TABLET | ORAL | 1 refills | Status: DC

## 2018-05-14 MED ORDER — LEVOTHYROXINE SODIUM 200 MCG PO TABS: 200.0000 ug | ORAL_TABLET | Freq: Every day | ORAL | 1 refills | Status: DC

## 2018-05-14 NOTE — Telephone Encounter (Signed)
Copied from [place] [id number]. Topic: Quick Communication - Rx Refill/Question >> May 14, 2018  2:17 PM [name] wrote: Medication: levothyroxine (SYNTHROID) 200 MCG tablet [[id number]]   Pt called [name]/c she tried to get the medication refilled but the medication was denied; pt is looking for assistance from pcp to get the medication refilled; contact to advise

## 2018-05-14 NOTE — Telephone Encounter (Signed)
Rx has been sent. Pt aware 

## 2018-05-14 NOTE — Telephone Encounter (Signed)
MD assistant denied refill will forward to [facility] to assist../lmb

## 2018-05-15 DIAGNOSIS — J439 Emphysema, unspecified: Secondary | ICD-10-CM

## 2018-05-15 DIAGNOSIS — I70203 Unspecified atherosclerosis of native arteries of extremities, bilateral legs: Secondary | ICD-10-CM

## 2018-05-15 DIAGNOSIS — F1120 Opioid dependence, uncomplicated: Secondary | ICD-10-CM

## 2018-05-15 DIAGNOSIS — Z7901 Long term (current) use of anticoagulants: Secondary | ICD-10-CM

## 2018-05-15 DIAGNOSIS — I872 Venous insufficiency (chronic) (peripheral): Secondary | ICD-10-CM

## 2018-05-15 DIAGNOSIS — Z6841 Body Mass Index (BMI) 40.0 and over, adult: Secondary | ICD-10-CM

## 2018-05-15 DIAGNOSIS — I2510 Atherosclerotic heart disease of native coronary artery without angina pectoris: Secondary | ICD-10-CM

## 2018-05-15 DIAGNOSIS — M545 Low back pain: Secondary | ICD-10-CM

## 2018-05-15 DIAGNOSIS — Z86711 Personal history of pulmonary embolism: Secondary | ICD-10-CM

## 2018-05-15 DIAGNOSIS — F329 Major depressive disorder, single episode, unspecified: Secondary | ICD-10-CM

## 2018-05-15 DIAGNOSIS — Z720 Tobacco use: Secondary | ICD-10-CM

## 2018-05-15 DIAGNOSIS — Z96649 Presence of unspecified artificial hip joint: Secondary | ICD-10-CM

## 2018-05-15 DIAGNOSIS — M170 Bilateral primary osteoarthritis of knee: Secondary | ICD-10-CM

## 2018-05-29 ENCOUNTER — Telehealth: Payer: Self-pay | Admitting: Internal Medicine

## 2018-05-29 NOTE — Telephone Encounter (Signed)
Copied from [place] [id number]. Topic: Quick Communication - Home Health Verbal Orders >> May 29, 2018  1:20 PM Yvette Rack wrote: Caller/Agency: PT Monique from [facility] [phone] Callback Number: [phone] Requesting OT/PT/Skilled Nursing/Social Work: PT for balance training home safety exercise and strengthen  Frequency: 2 times a week for 4 weeks

## 2018-05-29 NOTE — Telephone Encounter (Signed)
LVM for Monique to call back in regards.

## 2018-06-01 ENCOUNTER — Ambulatory Visit (INDEPENDENT_AMBULATORY_CARE_PROVIDER_SITE_OTHER): Payer: Medicare Other | Admitting: General Practice

## 2018-06-01 DIAGNOSIS — Z7901 Long term (current) use of anticoagulants: Secondary | ICD-10-CM | POA: Diagnosis not present

## 2018-06-01 DIAGNOSIS — Z86711 Personal history of pulmonary embolism: Secondary | ICD-10-CM

## 2018-06-01 LAB — POCT INR: INR: 2.7 (ref 2.0–3.0)

## 2018-06-01 NOTE — Patient Instructions (Signed)
Pre visit review using our clinic review tool, if applicable. No additional management support is needed unless otherwise documented below in the visit note.  Continue to take 1 tablet daily.  Re-check in 4 weeks.  

## 2018-06-01 NOTE — Progress Notes (Signed)
Agree with management.  Dorann Davidson J Jamen Loiseau, MD  

## 2018-06-19 ENCOUNTER — Telehealth: Payer: Self-pay | Admitting: Internal Medicine

## 2018-06-19 NOTE — Telephone Encounter (Signed)
Called and gave ok for verbal orders per Dr. Quay Burow.

## 2018-06-19 NOTE — Telephone Encounter (Signed)
Copied from [place] [phone]. Topic: Quick Communication - See Telephone Encounter >> Jun 19, 2018  4:18 PM Antonieta Iba C wrote: CRM for notification. See Telephone encounter for: 06/19/18.  Monique PT w/ [facility] called in to request vo for recert eval to continue PT with pt   CB: [phone]   Frequency: 2 times a week for 4 weeks

## 2018-06-29 ENCOUNTER — Ambulatory Visit: Payer: Medicare Other

## 2018-07-16 ENCOUNTER — Encounter: Payer: Self-pay | Admitting: Internal Medicine

## 2018-07-18 NOTE — Telephone Encounter (Signed)
Per Lovena Le, patient should be seen by Sport Medicine. Called patient, she already has an orthopedic and will see them for the shoulder pain.

## 2018-08-20 ENCOUNTER — Encounter: Payer: Self-pay | Admitting: Internal Medicine

## 2018-08-30 ENCOUNTER — Encounter: Payer: Self-pay | Admitting: Internal Medicine

## 2018-10-15 NOTE — Progress Notes (Signed)
Subjective:    Patient ID: [name], female    DOB: March 07, 1956, 63 y.o.   MRN: [id number]  HPI The patient is here for follow up.  Hypothyroidism:  She is taking her medication daily.  She denies any recent changes in energy or weight that are unexplained.   Venous insufficiency, b/l LE edema:   She wears compression socks.  She follows with vascular.    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  She follows with Jenny Reichmann.  She is due for an INR.    B12 deficiency:  She is taking B12 daily.      H/o smoking, COPD:  She was exposed to smoke from her parents when she was a child and smoked [age] about 1 ppd.  She is interested in lung cancer screening.    Left upper arm muscle pain after flu shot 6 months ago - it has not gotten better.     Medications and allergies reviewed with patient and updated if appropriate.  Patient Active Problem List   Diagnosis Date Noted  . Depression 04/17/2018  . Mixed incontinence 11/10/2017  . B12 deficiency 10/12/2017  . Microscopic hematuria 10/12/2017  . Hyperglycemia 10/10/2017  . Numbness and tingling 10/10/2017  . Lower abdominal pain 10/10/2017  . Positive colorectal cancer screening using Cologuard test   . Bilateral primary osteoarthritis of knee 11/18/2016  . Female bladder prolapse 10/10/2016  . Long term current use of anticoagulant 10/10/2016  . Falls frequently 07/14/2016  . Syncope 07/14/2016  . Generalized weakness   . Hyperlipidemia 06/10/2014  . Severe obesity (BMI >= 40) ([place]) 06/10/2014  . CAD (coronary artery disease)   . Interstitial cystitis 01/31/2013  . Chronic narcotic dependence ([place]) 01/30/2013  . S/P hip replacement 11/01/2012  . Venous insufficiency (chronic) (peripheral)   . Atherosclerosis of native arteries of the extremities with ulceration ([place]) 07/21/2011  . Esophageal spasm 06/14/2011  . Visual floaters 05/31/2011  . Constipation 05/12/2011  .  Hypothyroidism 01/19/2010  . LEG CRAMPS, NOCTURNAL 01/18/2010  . Bilateral leg edema 01/18/2010  . History of pulmonary embolism 08/10/2007  . 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 LOW BACK PAIN, CHRONIC 08/10/2007  . MEDIASTINAL ADENOPATHY CA[place]LEMANS D 08/10/2007    Current Outpatient Medications on File Prior to Visit  Medication Sig Dispense Refill  . bisacodyl (BISACODYL LAXATIVE) 5 MG EC tablet Take 30 mg by mouth daily as needed for moderate constipation.    . calcium carbonate (TUMS - DOSED IN MG ELEMENTAL CALCIUM) 500 MG chewable tablet Chew 1 tablet by mouth as needed for indigestion or heartburn.    Marland Kitchen HYDROmorphone (DILAUDID) 4 MG tablet TAKE 1 TO 2 TABLETS BY MOUTH EVERY 6 HOURS AS NEEDED FOR BREAKTHROUGH PAIN  0  . hyoscyamine (LEVSIN SL) 0.125 MG SL tablet Place 1 tablet (0.125 mg total) under the tongue as directed. Take 1-2 daily as needed. 30 tablet 0  . levothyroxine (SYNTHROID) 200 MCG tablet Take 1 tablet (200 mcg total) by mouth daily before breakfast. 90 tablet 1  . levothyroxine (SYNTHROID, LEVOTHROID) 25 MCG tablet TAKE 1 TAB BY MOUTH DAILY BEFORE BREAKFAST. TAKE IN ADDITION TO 200 MCG TOTALING 225 MCG DAILY 90 tablet 1  . methadone (DOLOPHINE) 10 MG tablet Take 10 mg by mouth every 8 (eight) hours as needed. for pain  0  . vitamin B-12 (CYANOCOBALAMIN) 1000 MCG tablet Take 1 tablet (1,000 mcg total) by mouth daily.    Marland Kitchen  warfarin (COUMADIN) 5 MG tablet Take 1 tablet daily except 2 tablets on Mon Wed Fri or AS DIRECTED BY ANTICOAGULATION CLINIC 40 tablet 0   No current facility-administered medications on file prior to visit.     Past Medical History:  Diagnosis Date  . Anemia, unspecified   . Anxiety   . Arthritis    "about q joint i've got" (07/14/2016)  . Benign neoplasm of colon 12/2007   Hyperplastic colon polyps  . CAD (coronary artery disease)    minimal catheterization, October 2008  . Cellulitis   . Chest pain    with stress  . Childhood asthma     . Chronic low back pain   . Constipation    and diarrhea chronic..Dr. Alben Spittle  . Depression   . Diverticulosis of colon (without mention of hemorrhage)   . DVT (deep venous thrombosis) ([facility]) 1990s   LLE  . Family history of adverse reaction to anesthesia    "daughter died having her 1st child cause she got too much anesthesia"   . GERD (gastroesophageal reflux disease)   . History of blood transfusion 01/2008   "related to hip OR"  . 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 Hypopotassemia   . Hypotension, unspecified   . Leg pain   . 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  . Methadone adverse reaction    for chronic leg and back pain  . Other pulmonary embolism and infarction 2008   Significant 2008 and coumadin therapy RV dysfunction...echo...2008..EF 50%..right ventricle markedly dilated w marked right ventricular dysfunc and moder tricuspid regurg/echo..March 2010, Ef 50%, mild dilation of right ventricule w mild decrease right ventric function   . Peripheral vascular disease ([place])   . Rectus sheath hematoma 07/13/2012   On coumadin   . Right bundle branch block    intermittent  . Thyroid disease   . Tobacco use disorder   . Urinary incontinence   . Venous insufficiency (chronic) (peripheral)    Patient's legs were wrapped 2013    Past Surgical History:  Procedure Laterality Date  . CARDIAC CATHETERIZATION  05/2007   Archie Endo 12/08/2010  . CHOLECYSTECTOMY OPEN    . COLONOSCOPY WITH PROPOFOL N/A 08/11/2017   Procedure: COLONOSCOPY WITH PROPOFOL;  Surgeon: Doran Stabler, MD;  Location: [facility];  Service: Gastroenterology;  Laterality: N/A;  . GASTRIC BYPASS  1980s  . JOINT REPLACEMENT    . KNEE ARTHROSCOPY Left 05/2001   Archie Endo 12/21/2010  . SHOULDER ARTHROSCOPY W/ ROTATOR CUFF REPAIR Right 08/2002   Archie Endo 12/21/2010  . TONSILLECTOMY    . TOTAL HIP ARTHROPLASTY Right 01/2008    Social History    Socioeconomic History  . Marital status: Divorced    Spouse name: Not on file  . Number of children: 1  . Years of education: Not on file  . Highest education level: Not on file  Occupational History  . Occupation: Disabled    Fish farm manager: UNEMPLOYED  Social Needs  . Financial resource strain: Not hard at all  . Food insecurity:    Worry: Never true    Inability: Never true  . Transportation needs:    Medical: No    Non-medical: No  Tobacco Use  . Smoking status: Former Smoker    Packs/day: 0.10    Years: 27.00    Pack years: 2.70    Types: Cigarettes    Last attempt to quit: 06/09/2011  Years since quitting: 7.3  . Smokeless tobacco: Never Used  Substance and Sexual Activity  . Alcohol use: No    Alcohol/week: 0.0 standard drinks    Comment: 07/14/2016 "nothing since the early 1990s"  . Drug use: No  . Sexual activity: Not Currently  Lifestyle  . Physical activity:    Days per week: 0 days    Minutes per session: 0 min  . Stress: To some extent  Relationships  . Social connections:    Talks on phone: More than three times a week    Gets together: More than three times a week    Attends religious service: 1 to 4 times per year    Active member of club or organization: No    Attends meetings of clubs or organizations: Never    Relationship status: Divorced  Other Topics Concern  . Not on file  Social History Narrative   Current smoker wi last 12 mos.     Family History  Problem Relation Age of Onset  . Heart disease Mother   . Heart disease Father   . Crohn's disease Sister     Review of Systems  Constitutional: Negative for chills and fever.  Respiratory: Positive for shortness of breath (with exertion). Negative for cough and wheezing.   Cardiovascular: Positive for leg swelling. Negative for chest pain and palpitations.  Neurological: Positive for headaches (stress). Negative for light-headedness.       Objective:   Vitals:   10/16/18 1400  BP:  118/68  Pulse: 68  Resp: 18  Temp: 99.1 F (37.3 C)  SpO2: 96%   BP Readings from Last 3 Encounters:  10/16/18 118/68  04/17/18 108/64  04/17/18 108/64   Wt Readings from Last 3 Encounters:  10/16/18 (!) 312 lb (141.5 kg)  04/17/18 (!) 320 lb (145.2 kg)  04/17/18 (!) 320 lb (145.2 kg)   Body mass index is 51.92 kg/m.   Physical Exam    Constitutional: Appears well-developed and well-nourished. No distress.  HENT:  Head: Normocephalic and atraumatic.  Neck: Neck supple. No tracheal deviation present. No thyromegaly present.  No cervical lymphadenopathy Cardiovascular: Normal rate, regular rhythm and normal heart sounds.   No murmur heard. No carotid bruit .  Trace b/l LE edema with chronic hyperpigmentation from venous stasis Pulmonary/Chest: Effort normal and breath sounds normal. No respiratory distress. No has no wheezes. No rales.  Skin: Skin is warm and dry. Not diaphoretic. Rash under left breast - erythematous typical of yeast Psychiatric: Normal mood and affect. Behavior is normal.      Assessment & Plan:    See Problem List for Assessment and Plan of chronic medical problems.

## 2018-10-15 NOTE — Patient Instructions (Addendum)
  Tests ordered today. Your results will be released to [place] (or called to you) after review, usually within 72hours after test completion. If any changes need to be made, you will be notified at that same time.   Medications reviewed and updated.  Changes include :   none   A referral was ordered for pulmonary   Please followup in 6 months

## 2018-10-16 ENCOUNTER — Encounter: Payer: Self-pay | Admitting: Internal Medicine

## 2018-10-16 ENCOUNTER — Ambulatory Visit (INDEPENDENT_AMBULATORY_CARE_PROVIDER_SITE_OTHER): Payer: Medicare Other | Admitting: General Practice

## 2018-10-16 ENCOUNTER — Ambulatory Visit (INDEPENDENT_AMBULATORY_CARE_PROVIDER_SITE_OTHER): Payer: Medicare Other | Admitting: Internal Medicine

## 2018-10-16 ENCOUNTER — Other Ambulatory Visit (INDEPENDENT_AMBULATORY_CARE_PROVIDER_SITE_OTHER): Payer: Medicare Other

## 2018-10-16 ENCOUNTER — Other Ambulatory Visit: Payer: Self-pay | Admitting: Internal Medicine

## 2018-10-16 VITALS — BP 118/68 | HR 68 | Temp 99.1°F | Resp 18 | Ht 65.0 in | Wt 312.0 lb

## 2018-10-16 DIAGNOSIS — I872 Venous insufficiency (chronic) (peripheral): Secondary | ICD-10-CM | POA: Diagnosis not present

## 2018-10-16 DIAGNOSIS — G8929 Other chronic pain: Secondary | ICD-10-CM | POA: Insufficient documentation

## 2018-10-16 DIAGNOSIS — Z86711 Personal history of pulmonary embolism: Secondary | ICD-10-CM

## 2018-10-16 DIAGNOSIS — M79622 Pain in left upper arm: Secondary | ICD-10-CM

## 2018-10-16 DIAGNOSIS — Z1231 Encounter for screening mammogram for malignant neoplasm of breast: Secondary | ICD-10-CM

## 2018-10-16 DIAGNOSIS — Z7901 Long term (current) use of anticoagulants: Secondary | ICD-10-CM

## 2018-10-16 DIAGNOSIS — M25512 Pain in left shoulder: Secondary | ICD-10-CM | POA: Insufficient documentation

## 2018-10-16 DIAGNOSIS — E538 Deficiency of other specified B group vitamins: Secondary | ICD-10-CM | POA: Diagnosis not present

## 2018-10-16 DIAGNOSIS — E038 Other specified hypothyroidism: Secondary | ICD-10-CM

## 2018-10-16 DIAGNOSIS — J439 Emphysema, unspecified: Secondary | ICD-10-CM

## 2018-10-16 LAB — COMPREHENSIVE METABOLIC PANEL
ALT: 19 U/L (ref 0–35)
AST: 28 U/L (ref 0–37)
Albumin: 3.7 g/dL (ref 3.5–5.2)
Alkaline Phosphatase: 88 U/L (ref 39–117)
BUN: 10 mg/dL (ref 6–23)
CO2: 31 mEq/L (ref 19–32)
Calcium: 8.9 mg/dL (ref 8.4–10.5)
Chloride: 102 mEq/L (ref 96–112)
Creatinine, Ser: 0.66 mg/dL (ref 0.40–1.20)
GFR: 89.93 mL/min (ref >60.00)
Glucose, Bld: 87 mg/dL (ref 70–99)
Potassium: 3.6 mEq/L (ref 3.5–5.1)
SODIUM: 138 meq/L (ref 135–145)
Total Bilirubin: 0.5 mg/dL (ref 0.2–1.2)
Total Protein: 7.6 g/dL (ref 6.0–8.3)

## 2018-10-16 LAB — TSH: TSH: 3.44 u[IU]/mL (ref 0.35–4.50)

## 2018-10-16 LAB — POCT INR: INR: 1.5 — AB (ref 2.0–3.0)

## 2018-10-16 MED ORDER — NYSTATIN 100000 UNIT/GM EX POWD: Freq: Four times a day (QID) | CUTANEOUS | 5 refills | Status: DC

## 2018-10-16 NOTE — Assessment & Plan Note (Signed)
SOB with exertion Smoke exposure as a child - her parents smoke and she smoked from age [age] ish   1 ppd - she is interested in annual Ct scan - lung cancer screening Referred to pulm

## 2018-10-16 NOTE — Patient Instructions (Signed)
Pre visit review using our clinic review tool, if applicable. No additional management support is needed unless otherwise documented below in the visit note.  Take 1 1/2 tablets today and tomorrow and then continue to take 1 tablet daily.  Re-check in 2 weeks.  Patient is looking into home monitoring due to transportation issues.

## 2018-10-16 NOTE — Assessment & Plan Note (Signed)
Continue warfarin lifelong Following with Jenny Reichmann - will have INR checked today

## 2018-10-16 NOTE — Assessment & Plan Note (Signed)
Taking B12 daily Continue lifelong

## 2018-10-16 NOTE — Assessment & Plan Note (Signed)
Clinically euthyroid Check tsh  Titrate med dose if needed  

## 2018-10-16 NOTE — Assessment & Plan Note (Signed)
Left upper arm pain since flu vaccine 6 months ago Will refer to Dr Tamala Julian

## 2018-10-16 NOTE — Assessment & Plan Note (Signed)
Chronic swelling with hyperpigmentation related to chronic venous insuff Wears compression socks daily

## 2018-10-16 NOTE — Assessment & Plan Note (Signed)
Continue warfarin lifelong for h/o PE Following with Cindy - INR to be checked today

## 2018-10-16 NOTE — Progress Notes (Signed)
Agree with management.  [name] J Sela Falk, MD  

## 2018-10-17 MED ORDER — LEVOTHYROXINE SODIUM 25 MCG PO TABS: ORAL_TABLET | ORAL | 1 refills | Status: DC

## 2018-10-17 MED ORDER — LEVOTHYROXINE SODIUM 200 MCG PO TABS: 200.0000 ug | ORAL_TABLET | Freq: Every day | ORAL | 1 refills | Status: DC

## 2018-10-17 NOTE — Telephone Encounter (Signed)
Patient is requesting synthroid refills to be sent to [facility] on [address].

## 2018-10-17 NOTE — Telephone Encounter (Signed)
Rx sent 

## 2018-10-18 ENCOUNTER — Ambulatory Visit: Payer: Medicare Other

## 2018-11-19 ENCOUNTER — Ambulatory Visit: Payer: Medicare Other | Admitting: Family Medicine

## 2018-11-21 ENCOUNTER — Encounter: Payer: Self-pay | Admitting: Internal Medicine

## 2018-11-23 ENCOUNTER — Ambulatory Visit (INDEPENDENT_AMBULATORY_CARE_PROVIDER_SITE_OTHER): Payer: Medicare Other | Admitting: General Practice

## 2018-11-23 DIAGNOSIS — Z86711 Personal history of pulmonary embolism: Secondary | ICD-10-CM

## 2018-11-23 DIAGNOSIS — Z7901 Long term (current) use of anticoagulants: Secondary | ICD-10-CM

## 2018-11-23 LAB — POCT INR: INR: 2.1 (ref 2.0–3.0)

## 2018-11-23 NOTE — Patient Instructions (Signed)
Pre visit review using our clinic review tool, if applicable. No additional management support is needed unless otherwise documented below in the visit note.  Continue to take 1 tablet daily.  Re-check in 2 weeks.  Patient is using [employer] home monitoring.

## 2018-11-23 NOTE — Progress Notes (Signed)
Agree with management.  Carmen Hellenbrand J Barrington Worley, MD  

## 2018-12-07 ENCOUNTER — Ambulatory Visit (INDEPENDENT_AMBULATORY_CARE_PROVIDER_SITE_OTHER): Payer: Medicare Other | Admitting: General Practice

## 2018-12-07 DIAGNOSIS — Z7901 Long term (current) use of anticoagulants: Secondary | ICD-10-CM

## 2018-12-07 DIAGNOSIS — Z86711 Personal history of pulmonary embolism: Secondary | ICD-10-CM | POA: Diagnosis not present

## 2018-12-07 LAB — POCT INR: INR: 2.3 (ref 2.0–3.0)

## 2018-12-07 NOTE — Patient Instructions (Signed)
Pre visit review using our clinic review tool, if applicable. No additional management support is needed unless otherwise documented below in the visit note.  Continue to take 1 tablet daily.  Re-check in 2 weeks.  Patient is using [employer] home monitoring.

## 2018-12-09 NOTE — Progress Notes (Signed)
Agree with management.  Naara Kelty J Seymour Pavlak, MD  

## 2018-12-21 ENCOUNTER — Ambulatory Visit (INDEPENDENT_AMBULATORY_CARE_PROVIDER_SITE_OTHER): Payer: Medicare Other | Admitting: General Practice

## 2018-12-21 DIAGNOSIS — Z86711 Personal history of pulmonary embolism: Secondary | ICD-10-CM | POA: Diagnosis not present

## 2018-12-21 DIAGNOSIS — Z7901 Long term (current) use of anticoagulants: Secondary | ICD-10-CM | POA: Diagnosis not present

## 2018-12-21 DIAGNOSIS — Z86718 Personal history of other venous thrombosis and embolism: Secondary | ICD-10-CM | POA: Diagnosis not present

## 2018-12-21 LAB — POCT INR: INR: 1.9 — AB (ref 2.0–3.0)

## 2018-12-21 NOTE — Patient Instructions (Signed)
Pre visit review using our clinic review tool, if applicable. No additional management support is needed unless otherwise documented below in the visit note.  Take extra 1/2 tablet today and then continue to take 1 tablet daily.  Re-check in 2 weeks.  Patient is using [employer] home monitoring.

## 2018-12-23 NOTE — Progress Notes (Signed)
Agree with management.  Carmen Wessler J Jaidon Sponsel, MD  

## 2019-01-04 ENCOUNTER — Other Ambulatory Visit: Payer: Self-pay | Admitting: Internal Medicine

## 2019-01-08 ENCOUNTER — Ambulatory Visit (INDEPENDENT_AMBULATORY_CARE_PROVIDER_SITE_OTHER): Payer: Medicare Other | Admitting: General Practice

## 2019-01-08 DIAGNOSIS — Z86711 Personal history of pulmonary embolism: Secondary | ICD-10-CM | POA: Diagnosis not present

## 2019-01-08 DIAGNOSIS — Z7901 Long term (current) use of anticoagulants: Secondary | ICD-10-CM | POA: Diagnosis not present

## 2019-01-08 LAB — POCT INR: INR: 1.8 — AB (ref 2.0–3.0)

## 2019-01-08 NOTE — Progress Notes (Signed)
Agree with management.  Neira Bentsen J Gwendola Hornaday, MD  

## 2019-01-08 NOTE — Patient Instructions (Signed)
Pre visit review using our clinic review tool, if applicable. No additional management support is needed unless otherwise documented below in the visit note.  Take extra 1/2 tablet today and then change dosage and take 1 tablet daily except take 1 1/2 tablets on tuesdays.  Re-check in 2 weeks.  Patient is using [employer] home monitoring.

## 2019-01-24 DIAGNOSIS — Z7901 Long term (current) use of anticoagulants: Secondary | ICD-10-CM | POA: Diagnosis not present

## 2019-01-24 DIAGNOSIS — Z86718 Personal history of other venous thrombosis and embolism: Secondary | ICD-10-CM | POA: Diagnosis not present

## 2019-02-01 ENCOUNTER — Ambulatory Visit (INDEPENDENT_AMBULATORY_CARE_PROVIDER_SITE_OTHER): Payer: Medicare Other | Admitting: General Practice

## 2019-02-01 DIAGNOSIS — Z7901 Long term (current) use of anticoagulants: Secondary | ICD-10-CM | POA: Diagnosis not present

## 2019-02-01 DIAGNOSIS — Z86711 Personal history of pulmonary embolism: Secondary | ICD-10-CM

## 2019-02-01 LAB — POCT INR: INR: 2.3 (ref 2.0–3.0)

## 2019-02-01 NOTE — Patient Instructions (Signed)
Pre visit review using our clinic review tool, if applicable. No additional management support is needed unless otherwise documented below in the visit note.  Take 1 tablet daily except take 1 1/2 tablets on tuesdays.  Re-check in 2 weeks.  Patient is using [employer] home monitoring.

## 2019-02-12 ENCOUNTER — Ambulatory Visit (INDEPENDENT_AMBULATORY_CARE_PROVIDER_SITE_OTHER): Payer: Medicare Other | Admitting: General Practice

## 2019-02-12 DIAGNOSIS — Z7901 Long term (current) use of anticoagulants: Secondary | ICD-10-CM

## 2019-02-12 LAB — POCT INR: INR: 2.1 (ref 2.0–3.0)

## 2019-02-12 NOTE — Patient Instructions (Signed)
Pre visit review using our clinic review tool, if applicable. No additional management support is needed unless otherwise documented below in the visit note.  Take 1 tablet daily except take 1 1/2 tablets on tuesdays.  Re-check in 2 weeks.  Patient is using [employer] home monitoring.

## 2019-02-24 ENCOUNTER — Encounter: Payer: Self-pay | Admitting: Internal Medicine

## 2019-02-25 ENCOUNTER — Ambulatory Visit (INDEPENDENT_AMBULATORY_CARE_PROVIDER_SITE_OTHER): Payer: Medicare Other | Admitting: General Practice

## 2019-02-25 DIAGNOSIS — Z7901 Long term (current) use of anticoagulants: Secondary | ICD-10-CM

## 2019-02-25 DIAGNOSIS — Z86711 Personal history of pulmonary embolism: Secondary | ICD-10-CM

## 2019-02-25 LAB — POCT INR: INR: 1.6 — AB (ref 2.0–3.0)

## 2019-02-25 NOTE — Patient Instructions (Signed)
Pre visit review using our clinic review tool, if applicable. No additional management support is needed unless otherwise documented below in the visit note.  Take 1 1/2 tablets today and 2 tablets tomorrow (7/20 and 7/21).  On Wednesday continue to take 1 tablet daily except 1 1/2 tablets on tuesdays.  Re-check in 2 weeks.  Patient is using [employer] home monitoring.

## 2019-03-08 ENCOUNTER — Ambulatory Visit (INDEPENDENT_AMBULATORY_CARE_PROVIDER_SITE_OTHER): Payer: Medicare Other | Admitting: General Practice

## 2019-03-08 DIAGNOSIS — Z7901 Long term (current) use of anticoagulants: Secondary | ICD-10-CM | POA: Diagnosis not present

## 2019-03-08 DIAGNOSIS — Z86718 Personal history of other venous thrombosis and embolism: Secondary | ICD-10-CM | POA: Diagnosis not present

## 2019-03-08 DIAGNOSIS — Z86711 Personal history of pulmonary embolism: Secondary | ICD-10-CM

## 2019-03-08 LAB — POCT INR: INR: 1.7 — AB (ref 2.0–3.0)

## 2019-03-08 NOTE — Patient Instructions (Signed)
Pre visit review using our clinic review tool, if applicable. No additional management support is needed unless otherwise documented below in the visit note.  Take 1 1/2 tablets today change dosage and take 1 tablet daily except 1 1/2 tablets on tuesdays and Fridays.   Re-check in 2 weeks.  Patient is using [employer] home monitoring.  Dosing instructions given to patient and she verbalized understanding.

## 2019-03-26 ENCOUNTER — Ambulatory Visit (INDEPENDENT_AMBULATORY_CARE_PROVIDER_SITE_OTHER): Payer: Medicare Other | Admitting: General Practice

## 2019-03-26 DIAGNOSIS — Z7901 Long term (current) use of anticoagulants: Secondary | ICD-10-CM | POA: Diagnosis not present

## 2019-03-26 LAB — POCT INR: INR: 1.6 — AB (ref 2.0–3.0)

## 2019-03-26 NOTE — Patient Instructions (Addendum)
Pre visit review using our clinic review tool, if applicable. No additional management support is needed unless otherwise documented below in the visit note.  Take 1 1/2 tablets today and tomorrow and then take 1 tablet daily except 1 1/2 tablets on tuesdays and Fridays.   Re-check in 1 weeks.  Patient is using [employer] home monitoring.  Dosing instructions given to patient and she verbalized understanding.

## 2019-03-27 NOTE — Progress Notes (Signed)
Agree with management.  Stacy J Burns, MD  

## 2019-04-01 ENCOUNTER — Telehealth: Payer: Self-pay

## 2019-04-01 NOTE — Telephone Encounter (Signed)
Take 7.5 mg tonight then increase to    7.5 mg tues, Friday and Sunday and 5 mg ROW.    F/u with Jenny Reichmann in 2 weeks

## 2019-04-01 NOTE — Telephone Encounter (Signed)
Pt aware of response.  

## 2019-04-01 NOTE — Telephone Encounter (Signed)
Copied from [place]. Topic: General - Other >> Apr 01, 2019  2:08 PM Wynetta Emery, [place] C wrote: Reason for CRM: pt is calling in to be advised. Pt says that Jenny Reichmann normally let her know how much Coumadin to use. Pt says that Jenny Reichmann is out until Thursday. Pt isn't sure of how much to use? Pt says that her coumadin tested 1.6. spoke with office advised to send a [place] .   [phone]

## 2019-04-02 LAB — POCT INR: INR: 1.6 — AB (ref 2.0–3.0)

## 2019-04-04 ENCOUNTER — Ambulatory Visit (INDEPENDENT_AMBULATORY_CARE_PROVIDER_SITE_OTHER): Payer: Medicare Other | Admitting: General Practice

## 2019-04-04 DIAGNOSIS — Z86711 Personal history of pulmonary embolism: Secondary | ICD-10-CM

## 2019-04-04 DIAGNOSIS — Z7901 Long term (current) use of anticoagulants: Secondary | ICD-10-CM

## 2019-04-04 NOTE — Progress Notes (Signed)
Agree with management.  Stacy J Burns, MD  

## 2019-04-04 NOTE — Patient Instructions (Signed)
Pre visit review using our clinic review tool, if applicable. No additional management support is needed unless otherwise documented below in the visit note.  Take 1 1/2 tablets today and then change dosage per Dr. Quay Burow and take 1 tablet daily except 1 1/2 tablets on Sunday tuesdays and Fridays.   Re-check on Friday 9/11.  Patient is using [employer] home monitoring.  Dosing instructions left on patient's vm.

## 2019-04-18 DIAGNOSIS — Z7901 Long term (current) use of anticoagulants: Secondary | ICD-10-CM | POA: Diagnosis not present

## 2019-04-18 DIAGNOSIS — Z86718 Personal history of other venous thrombosis and embolism: Secondary | ICD-10-CM | POA: Diagnosis not present

## 2019-04-19 ENCOUNTER — Ambulatory Visit: Payer: Medicare Other | Admitting: Internal Medicine

## 2019-04-19 ENCOUNTER — Ambulatory Visit (INDEPENDENT_AMBULATORY_CARE_PROVIDER_SITE_OTHER): Payer: Medicare Other | Admitting: General Practice

## 2019-04-19 DIAGNOSIS — Z86711 Personal history of pulmonary embolism: Secondary | ICD-10-CM

## 2019-04-19 DIAGNOSIS — Z7901 Long term (current) use of anticoagulants: Secondary | ICD-10-CM | POA: Diagnosis not present

## 2019-04-19 LAB — POCT INR: INR: 1.9 — AB (ref 2.0–3.0)

## 2019-04-19 NOTE — Patient Instructions (Signed)
Pre visit review using our clinic review tool, if applicable. No additional management support is needed unless otherwise documented below in the visit note. Take 1 1/2 tablets today and then change dosage per Dr. Quay Burow and take 1 1/2 tablets daily except 1  tablet on Mon Wed and Thur.   Re-check in 2 weeks..  Patient is using [employer] home monitoring.  Pt verbalized dosing instructions.

## 2019-04-20 NOTE — Progress Notes (Signed)
Agree with management.  Firas Guardado J Perseus Westall, MD  

## 2019-04-21 ENCOUNTER — Other Ambulatory Visit: Payer: Self-pay | Admitting: Internal Medicine

## 2019-05-03 ENCOUNTER — Encounter: Payer: Self-pay | Admitting: Pulmonary Disease

## 2019-05-06 ENCOUNTER — Telehealth: Payer: Self-pay | Admitting: Gastroenterology

## 2019-05-06 NOTE — Telephone Encounter (Signed)
Pt reported that she has not had a BM in 5 days and would like to know how many magnesium 500 mg tablets to take.

## 2019-05-06 NOTE — Telephone Encounter (Signed)
She can take multiple doses of MiraLAX until she has a bowel movement.  Keep appointment with PA in 2 days

## 2019-05-06 NOTE — Telephone Encounter (Signed)
Spoke to the patient about the Miralax/gatorade bowel purge. Patient verbalized understanding and will initiate. Patient reports she would keep [place] updated.

## 2019-05-06 NOTE — Telephone Encounter (Signed)
[name] patient, sent to Dr. 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 The patient has not had a BM in 5 days but was directed by another physician at another practice to take up to 2000 mg of Magnesium tablets as needed for constipation. The patient reports MiraLAX causes fecal incontinence but is desperate to have a bowel movement. Please advise.  Patient's last OV with Dr. Loletha Carrow, 06/2017, patient currently scheduled with Ellouise Newer on 9/30 at 1:30 pm.

## 2019-05-07 ENCOUNTER — Ambulatory Visit (INDEPENDENT_AMBULATORY_CARE_PROVIDER_SITE_OTHER): Payer: Medicare Other | Admitting: General Practice

## 2019-05-07 DIAGNOSIS — Z86711 Personal history of pulmonary embolism: Secondary | ICD-10-CM

## 2019-05-07 DIAGNOSIS — Z7901 Long term (current) use of anticoagulants: Secondary | ICD-10-CM | POA: Diagnosis not present

## 2019-05-07 LAB — POCT INR: INR: 1.7 — AB (ref 2.0–3.0)

## 2019-05-07 NOTE — Patient Instructions (Signed)
Pre visit review using our clinic review tool, if applicable. No additional management support is needed unless otherwise documented below in the visit note.  Take 2 tablets today and then change dosage and take 1 1/2 tablets daily except 1 tablet on Monday and Thursdays.  Re-check in 1 week.  Patient is using [employer] home monitoring.  Left dosing instructions on patient's VM and asked pt to call back to verify.

## 2019-05-07 NOTE — Progress Notes (Signed)
Agree with management.  Carmen Maka J Gloriana Piltz, MD  

## 2019-05-08 ENCOUNTER — Ambulatory Visit: Payer: Medicare Other | Admitting: Physician Assistant

## 2019-05-08 NOTE — Telephone Encounter (Signed)
Pt reported that Miralax purge has worked too well.  Pt canceled her appointment with PA.

## 2019-05-08 NOTE — Telephone Encounter (Signed)
Noted  

## 2019-05-13 ENCOUNTER — Ambulatory Visit (INDEPENDENT_AMBULATORY_CARE_PROVIDER_SITE_OTHER): Payer: Medicare Other | Admitting: General Practice

## 2019-05-13 DIAGNOSIS — Z7901 Long term (current) use of anticoagulants: Secondary | ICD-10-CM

## 2019-05-13 DIAGNOSIS — Z86711 Personal history of pulmonary embolism: Secondary | ICD-10-CM | POA: Diagnosis not present

## 2019-05-13 LAB — POCT INR: INR: 2.4 (ref 2.0–3.0)

## 2019-05-13 NOTE — Patient Instructions (Signed)
Pre visit review using our clinic review tool, if applicable. No additional management support is needed unless otherwise documented below in the visit note.  Continue to take 1 1/2 tablets daily except 1 tablet on Monday and Thursdays.  Re-check in 1 week.  Patient is using [employer] home monitoring.  Left dosing instructions on patient's VM and asked pt to call back to verify.

## 2019-05-15 ENCOUNTER — Other Ambulatory Visit: Payer: Self-pay | Admitting: Internal Medicine

## 2019-05-17 ENCOUNTER — Ambulatory Visit (INDEPENDENT_AMBULATORY_CARE_PROVIDER_SITE_OTHER): Payer: Medicare Other | Admitting: General Practice

## 2019-05-17 DIAGNOSIS — Z86711 Personal history of pulmonary embolism: Secondary | ICD-10-CM

## 2019-05-17 DIAGNOSIS — Z7901 Long term (current) use of anticoagulants: Secondary | ICD-10-CM

## 2019-05-17 LAB — POCT INR: INR: 2 (ref 2.0–3.0)

## 2019-05-17 NOTE — Patient Instructions (Signed)
Pre visit review using our clinic review tool, if applicable. No additional management support is needed unless otherwise documented below in the visit note. 

## 2019-05-19 NOTE — Progress Notes (Signed)
Agree with management.  Lonna Rabold J Dua Mehler, MD  

## 2019-05-23 NOTE — Progress Notes (Signed)
Subjective:    Patient ID: [name], female    DOB: 1955-09-15, 63 y.o.   MRN: [id number]  HPI The patient is here for follow up.  Her daughter passed away 2019/05/05.  She knows she is in a better place, but of course misses her.    She is exercising some - floor bike.     Hypothyroidism:  She is taking her medication daily.  She denies any recent changes in energy or weight that are unexplained.   LE edema due to venous insufficiency: she wears compression socks.  She feels her swelling is stable.  She does have bleeding at times from the legs with minimal trauma due to being on warfarin.  She follows with vascular as needed only.   H/o PE on warfarin:  She takes warfarin as prescribed.  She follows with our coumadin nurse.  Her INR has been good recently.    B12 def:  She is taking B complex vitamin daily.   COPD:  She is following with pulmonary. Her chronic SOB is unchanged.   She denies fever, cough, wheeze.     Medications and allergies reviewed with patient and updated if appropriate.  Patient Active Problem List   Diagnosis Date Noted  . Left upper arm pain 10/16/2018  . Depression 04/17/2018  . Mixed incontinence 11/10/2017  . B12 deficiency 10/12/2017  . Microscopic hematuria 10/12/2017  . Numbness and tingling 10/10/2017  . Positive colorectal cancer screening using Cologuard test   . Bilateral primary osteoarthritis of knee 11/18/2016  . Female bladder prolapse 10/10/2016  . Long term current use of anticoagulant 10/10/2016  . Falls frequently 07/14/2016  . Syncope 07/14/2016  . Generalized weakness   . Hyperlipidemia 06/10/2014  . Severe obesity (BMI >= 40) ([place]) 06/10/2014  . CAD (coronary artery disease)   . Interstitial cystitis 01/31/2013  . Chronic narcotic dependence ([place]) 01/30/2013  . S/P hip replacement 11/01/2012  . Venous insufficiency (chronic) (peripheral)   . Atherosclerosis of native arteries of the extremities with ulceration ([place])  07/21/2011  . Esophageal spasm 06/14/2011  . Visual floaters 05/31/2011  . Constipation 05/12/2011  . Hypothyroidism 01/19/2010  . LEG CRAMPS, NOCTURNAL 01/18/2010  . Bilateral leg edema 01/18/2010  . History of pulmonary embolism 08/10/2007  . 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 LOW BACK PAIN, CHRONIC 08/10/2007  . MEDIASTINAL ADENOPATHY CASTLEMANS D 08/10/2007    Current Outpatient Medications on File Prior to Visit  Medication Sig Dispense Refill  . bisacodyl (BISACODYL LAXATIVE) 5 MG EC tablet Take 30 mg by mouth daily as needed for moderate constipation.    . calcium carbonate (TUMS - DOSED IN MG ELEMENTAL CALCIUM) 500 MG chewable tablet Chew 1 tablet by mouth as needed for indigestion or heartburn.    Marland Kitchen HYDROmorphone (DILAUDID) 4 MG tablet TAKE 1 TO 2 TABLETS BY MOUTH EVERY 6 HOURS AS NEEDED FOR BREAKTHROUGH PAIN  0  . hyoscyamine (LEVSIN SL) 0.125 MG SL tablet Place 1 tablet (0.125 mg total) under the tongue as directed. Take 1-2 daily as needed. 30 tablet 0  . levothyroxine (SYNTHROID) 200 MCG tablet Take 1 tablet (200 mcg total) by mouth daily before breakfast. Need office visit for more refills 30 tablet 0  . levothyroxine (SYNTHROID) 25 MCG tablet TAKE 1 TAB BY MOUTH DAILY BEFORE BREAKFAST. TAKE IN ADDITION TO 200 MCG TOTALING 225 MCG DAILY **OFFICE VISIT DUE** 90 tablet 0  . methadone (DOLOPHINE) 10 MG tablet Take 10 mg by mouth every  8 (eight) hours as needed. for pain  0  . nystatin (MYCOSTATIN/NYSTOP) powder Apply topically 4 (four) times daily. 60 g 5  . vitamin B-12 (CYANOCOBALAMIN) 1000 MCG tablet Take 1 tablet (1,000 mcg total) by mouth daily.    Marland Kitchen warfarin (COUMADIN) 5 MG tablet TAKE 1 TABLET DAILY EXCEPT 2 TABLETS ON WED. OR AS DIRECTED BY ANTICOAGULATION CLINIC 105 tablet 1   No current facility-administered medications on file prior to visit.     Past Medical History:  Diagnosis Date  . Anemia, unspecified   . Anxiety   . Arthritis    "about q joint i've  got" (07/14/2016)  . Benign neoplasm of colon 12/2007   Hyperplastic colon polyps  . CAD (coronary artery disease)    minimal catheterization, October 2008  . Cellulitis   . Chest pain    with stress  . Childhood asthma   . Chronic low back pain   . Constipation    and diarrhea chronic..Dr. Alben Spittle  . Depression   . Diverticulosis of colon (without mention of hemorrhage)   . DVT (deep venous thrombosis) (HCC) 1990s   LLE  . Family history of adverse reaction to anesthesia    "daughter died having her 1st child cause she got too much anesthesia"   . GERD (gastroesophageal reflux disease)   . History of blood transfusion 01/2008   "related to hip OR"  . 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 Hypopotassemia   . Hypotension, unspecified   . Leg pain   . 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  . Methadone adverse reaction    for chronic leg and back pain  . Other pulmonary embolism and infarction 2008   Significant 2008 and coumadin therapy RV dysfunction...echo...2008..EF 50%..right ventricle markedly dilated w marked right ventricular dysfunc and moder tricuspid regurg/echo..March 2010, Ef 50%, mild dilation of right ventricule w mild decrease right ventric function   . Peripheral vascular disease ([place])   . Rectus sheath hematoma 07/13/2012   On coumadin   . Right bundle branch block    intermittent  . Thyroid disease   . Tobacco use disorder   . Urinary incontinence   . Venous insufficiency (chronic) (peripheral)    Patient's legs were wrapped 2013    Past Surgical History:  Procedure Laterality Date  . CARDIAC CATHETERIZATION  05/2007   Archie Endo 12/08/2010  . CHOLECYSTECTOMY OPEN    . COLONOSCOPY WITH PROPOFOL N/A 08/11/2017   Procedure: COLONOSCOPY WITH PROPOFOL;  Surgeon: Doran Stabler, MD;  Location: [facility];  Service: Gastroenterology;  Laterality: N/A;  . GASTRIC BYPASS  1980s  . JOINT  REPLACEMENT    . KNEE ARTHROSCOPY Left 05/2001   Archie Endo 12/21/2010  . SHOULDER ARTHROSCOPY W/ ROTATOR CUFF REPAIR Right 08/2002   Archie Endo 12/21/2010  . TONSILLECTOMY    . TOTAL HIP ARTHROPLASTY Right 01/2008    Social History   Socioeconomic History  . Marital status: Divorced    Spouse name: Not on file  . Number of children: 1  . Years of education: Not on file  . Highest education level: Not on file  Occupational History  . Occupation: Disabled    Fish farm manager: UNEMPLOYED  Social Needs  . Financial resource strain: Not hard at all  . Food insecurity    Worry: Never true    Inability: Never true  . Transportation needs    Medical: No  Non-medical: No  Tobacco Use  . Smoking status: Former Smoker    Packs/day: 0.10    Years: 27.00    Pack years: 2.70    Types: Cigarettes    Quit date: 06/09/2011    Years since quitting: 7.9  . Smokeless tobacco: Never Used  Substance and Sexual Activity  . Alcohol use: No    Alcohol/week: 0.0 standard drinks    Comment: 07/14/2016 "nothing since the early 1990s"  . Drug use: No  . Sexual activity: Not Currently  Lifestyle  . Physical activity    Days per week: 0 days    Minutes per session: 0 min  . Stress: To some extent  Relationships  . Social connections    Talks on phone: More than three times a week    Gets together: More than three times a week    Attends religious service: 1 to 4 times per year    Active member of club or organization: No    Attends meetings of clubs or organizations: Never    Relationship status: Divorced  Other Topics Concern  . Not on file  Social History Narrative   Current smoker wi last 12 mos.     Family History  Problem Relation Age of Onset  . Heart disease Mother   . Heart disease Father   . Crohn's disease Sister     Review of Systems  Constitutional: Negative for chills, fatigue and fever.  Respiratory: Positive for shortness of breath (chronic, no change). Negative for cough and  wheezing.   Cardiovascular: Positive for palpitations (occ, resolves with cough) and leg swelling. Negative for chest pain (except with muscle cramps in upper abdomen/lower chest).  Neurological: Positive for headaches (occ). Negative for light-headedness.       Objective:   Vitals:   05/24/19 1337  BP: 102/60  Pulse: 68  Temp: 98.3 F (36.8 C)  SpO2: 98%   BP Readings from Last 3 Encounters:  05/24/19 102/60  10/16/18 118/68  04/17/18 108/64   Wt Readings from Last 3 Encounters:  05/24/19 (!) 316 lb (143.3 kg)  10/16/18 (!) 312 lb (141.5 kg)  04/17/18 (!) 320 lb (145.2 kg)   Body mass index is 52.59 kg/m.   Physical Exam    Constitutional: Appears well-developed and well-nourished. No distress.  HENT:  Head: Normocephalic and atraumatic.  Neck: Neck supple. No tracheal deviation present. No thyromegaly present.  No cervical lymphadenopathy Cardiovascular: Normal rate, regular rhythm and normal heart sounds.  No murmur heard. No carotid bruit .  1+ non pitting b/l le edema - legs are tight, chronic hyperpigmentation and dry skin Pulmonary/Chest: Effort normal and breath sounds normal. No respiratory distress. No has no wheezes. No rales.  Skin: Skin is warm and dry. Not diaphoretic.  Psychiatric: Normal mood and affect. Behavior is normal.      Assessment & Plan:    See Problem List for Assessment and Plan of chronic medical problems.

## 2019-05-24 ENCOUNTER — Other Ambulatory Visit: Payer: Self-pay

## 2019-05-24 ENCOUNTER — Other Ambulatory Visit (INDEPENDENT_AMBULATORY_CARE_PROVIDER_SITE_OTHER): Payer: Medicare Other

## 2019-05-24 ENCOUNTER — Encounter: Payer: Self-pay | Admitting: Internal Medicine

## 2019-05-24 ENCOUNTER — Ambulatory Visit (INDEPENDENT_AMBULATORY_CARE_PROVIDER_SITE_OTHER): Payer: Medicare Other | Admitting: Internal Medicine

## 2019-05-24 VITALS — BP 102/60 | HR 68 | Temp 98.3°F | Ht 65.0 in | Wt 316.0 lb

## 2019-05-24 DIAGNOSIS — R600 Localized edema: Secondary | ICD-10-CM | POA: Diagnosis not present

## 2019-05-24 DIAGNOSIS — E038 Other specified hypothyroidism: Secondary | ICD-10-CM | POA: Diagnosis not present

## 2019-05-24 DIAGNOSIS — Z23 Encounter for immunization: Secondary | ICD-10-CM | POA: Diagnosis not present

## 2019-05-24 DIAGNOSIS — E538 Deficiency of other specified B group vitamins: Secondary | ICD-10-CM | POA: Diagnosis not present

## 2019-05-24 DIAGNOSIS — E782 Mixed hyperlipidemia: Secondary | ICD-10-CM

## 2019-05-24 DIAGNOSIS — Z86711 Personal history of pulmonary embolism: Secondary | ICD-10-CM | POA: Diagnosis not present

## 2019-05-24 DIAGNOSIS — J439 Emphysema, unspecified: Secondary | ICD-10-CM

## 2019-05-24 LAB — LIPID PANEL
Cholesterol: 169 mg/dL (ref 0–200)
HDL: 32.5 mg/dL — ABNORMAL LOW (ref >39.00)
NonHDL: 136.15
Total CHOL/HDL Ratio: 5
Triglycerides: 212 mg/dL — ABNORMAL HIGH (ref 0.0–149.0)
VLDL: 42.4 mg/dL — ABNORMAL HIGH (ref 0.0–40.0)

## 2019-05-24 LAB — CBC WITH DIFFERENTIAL/PLATELET
Basophils Absolute: 0 10*3/uL (ref 0.0–0.1)
Basophils Relative: 0.4 % (ref 0.0–3.0)
Eosinophils Absolute: 0.1 10*3/uL (ref 0.0–0.7)
Eosinophils Relative: 2.1 % (ref 0.0–5.0)
HCT: 40.8 % (ref 36.0–46.0)
Hemoglobin: 13.6 g/dL (ref 12.0–15.0)
Lymphocytes Relative: 23.3 % (ref 12.0–46.0)
Lymphs Abs: 1.3 10*3/uL (ref 0.7–4.0)
MCHC: 33.2 g/dL (ref 30.0–36.0)
MCV: 88.8 fl (ref 78.0–100.0)
Monocytes Absolute: 0.4 10*3/uL (ref 0.1–1.0)
Monocytes Relative: 7.6 % (ref 3.0–12.0)
Neutro Abs: 3.7 10*3/uL (ref 1.4–7.7)
Neutrophils Relative %: 66.6 % (ref 43.0–77.0)
Platelets: 182 10*3/uL (ref 150.0–400.0)
RBC: 4.6 Mil/uL (ref 3.87–5.11)
RDW: 14.7 % (ref 11.5–15.5)
WBC: 5.6 10*3/uL (ref 4.0–10.5)

## 2019-05-24 LAB — LDL CHOLESTEROL, DIRECT: Direct LDL: 105 mg/dL

## 2019-05-24 LAB — COMPREHENSIVE METABOLIC PANEL
ALT: 17 U/L (ref 0–35)
AST: 24 U/L (ref 0–37)
Albumin: 4 g/dL (ref 3.5–5.2)
Alkaline Phosphatase: 97 U/L (ref 39–117)
BUN: 6 mg/dL (ref 6–23)
CO2: 32 mEq/L (ref 19–32)
Calcium: 9.1 mg/dL (ref 8.4–10.5)
Chloride: 101 mEq/L (ref 96–112)
Creatinine, Ser: 0.71 mg/dL (ref 0.40–1.20)
GFR: 82.5 mL/min (ref >60.00)
Glucose, Bld: 92 mg/dL (ref 70–99)
Potassium: 3.7 mEq/L (ref 3.5–5.1)
Sodium: 139 mEq/L (ref 135–145)
Total Bilirubin: 0.5 mg/dL (ref 0.2–1.2)
Total Protein: 8 g/dL (ref 6.0–8.3)

## 2019-05-24 LAB — TSH: TSH: 4.03 u[IU]/mL (ref 0.35–4.50)

## 2019-05-24 LAB — VITAMIN B12: Vitamin B-12: 279 pg/mL (ref 211–911)

## 2019-05-24 NOTE — Assessment & Plan Note (Signed)
Intolerant of statin Check lipids, cmp

## 2019-05-24 NOTE — Assessment & Plan Note (Signed)
Chronic SOB - no change Following with pulmonary

## 2019-05-24 NOTE — Patient Instructions (Addendum)
  Tests ordered today. Your results will be released to [place] (or called to you) after review.  If any changes need to be made, you will be notified at that same time.    Flu and Pneumonia immunizations administered today.    Medications reviewed and updated.  Changes include :   none     Please followup in 6 months

## 2019-05-24 NOTE — Assessment & Plan Note (Signed)
Taking b complex Check B12 level

## 2019-05-24 NOTE — Assessment & Plan Note (Signed)
Clinically euthyroid Check tsh  Titrate med dose if needed  

## 2019-05-24 NOTE — Assessment & Plan Note (Signed)
Secondary to chronic venous insuff and h/o DVT  Wears compression socks Sees vascular prn

## 2019-05-24 NOTE — Assessment & Plan Note (Signed)
On warfarin lifelong INR recently good Following with our coumadin nurse - Jenny Reichmann

## 2019-05-26 ENCOUNTER — Encounter: Payer: Self-pay | Admitting: Internal Medicine

## 2019-05-30 ENCOUNTER — Ambulatory Visit (INDEPENDENT_AMBULATORY_CARE_PROVIDER_SITE_OTHER): Payer: Medicare Other | Admitting: Pulmonary Disease

## 2019-05-30 ENCOUNTER — Other Ambulatory Visit: Payer: Self-pay

## 2019-05-30 ENCOUNTER — Encounter: Payer: Self-pay | Admitting: Pulmonary Disease

## 2019-05-30 VITALS — BP 108/60 | HR 62 | Temp 97.3°F | Ht 68.0 in | Wt 320.0 lb

## 2019-05-30 DIAGNOSIS — Z86711 Personal history of pulmonary embolism: Secondary | ICD-10-CM | POA: Diagnosis not present

## 2019-05-30 DIAGNOSIS — J432 Centrilobular emphysema: Secondary | ICD-10-CM

## 2019-05-30 DIAGNOSIS — F17210 Nicotine dependence, cigarettes, uncomplicated: Secondary | ICD-10-CM | POA: Diagnosis not present

## 2019-05-30 NOTE — Progress Notes (Signed)
Subjective:    Patient ID: [name], female    DOB: 08/07/1956, 63 y.o.   MRN: [id number]  HPI 63 year old ex-smoker presents for evaluation of dyspnea She also requests screening study for lung cancer  She started smoking at age 1, was exposed to secondhand smoke prior to that, smoked on and off for many years, for about 10 years she smoked about a pack per day, she finally quit in 2012-total calculated about 30 pack years She had extensive bilateral pulmonary emboli causing right ventricular dysfunction in 05/2007, abnormal troponins Elevated homocysteine level (69), on folate, and and vitamin B6.  Factor V Ley[place] was negative. Serology showed positive lupus anticoagulant. On coumadin since then  Her last visit with [place] was in 2015 when she had mild airway obstruction on spirometry and we felt mostly that her dyspnea was due to anxiety and obesity and deconditioning. She reports persistent dyspnea but denies wheezing or frequent chest colds She also has chronic pain and is maintained on Dilaudid and methadone, her daughter died from opiate overdose She is up-to-date with her flu shot and Pneumovax  Significant tests/ events reviewed  CT angiogram chest 07/2016 mild groundglass opacity at bases-no overt ILD  2012 - FEV1 79% Spirometry 07/2014  ratio of 70, FEV1 of 75% and FVC of 83%-flow volume loop suggests obstruction  Past Medical History:  Diagnosis Date  . Anemia, unspecified   . Anxiety   . Arthritis    "about q joint i've got" (07/14/2016)  . Benign neoplasm of colon 12/2007   Hyperplastic colon polyps  . CAD (coronary artery disease)    minimal catheterization, October 2008  . Cellulitis   . Chest pain    with stress  . Childhood asthma   . Chronic low back pain   . Constipation    and diarrhea chronic..Dr. Alben Spittle  . Depression   . Diverticulosis of colon (without mention of hemorrhage)   . DVT (deep venous thrombosis) ([facility]) 1990s   LLE  . Family  history of adverse reaction to anesthesia    "daughter died having her 1st child cause she got too much anesthesia"   . GERD (gastroesophageal reflux disease)   . History of blood transfusion 01/2008   "related to hip OR"  . 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 Hypopotassemia   . Hypotension, unspecified   . Leg pain   . 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  . Methadone adverse reaction    for chronic leg and back pain  . Other pulmonary embolism and infarction 2008   Significant 2008 and coumadin therapy RV dysfunction...echo...2008..EF 50%..right ventricle markedly dilated w marked right ventricular dysfunc and moder tricuspid regurg/echo..March 2010, Ef 50%, mild dilation of right ventricule w mild decrease right ventric function   . Peripheral vascular disease ([place])   . Rectus sheath hematoma 07/13/2012   On coumadin   . Right bundle branch block    intermittent  . Thyroid disease   . Tobacco use disorder   . Urinary incontinence   . Venous insufficiency (chronic) (peripheral)    Patient's legs were wrapped 2013   Past Surgical History:  Procedure Laterality Date  . CARDIAC CATHETERIZATION  05/2007   Archie Endo 12/08/2010  . CHOLECYSTECTOMY OPEN    . COLONOSCOPY WITH PROPOFOL N/A 08/11/2017   Procedure: COLONOSCOPY WITH PROPOFOL;  Surgeon: Doran Stabler, MD;  Location: [facility];  Service: Gastroenterology;  Laterality: N/A;  . GASTRIC BYPASS  1980s  . JOINT REPLACEMENT    . KNEE ARTHROSCOPY Left 05/2001   Archie Endo 12/21/2010  . SHOULDER ARTHROSCOPY W/ ROTATOR CUFF REPAIR Right 08/2002   Archie Endo 12/21/2010  . TONSILLECTOMY    . TOTAL HIP ARTHROPLASTY Right 01/2008    Allergies  Allergen Reactions  . Amitiza [Lubiprostone] Diarrhea and Nausea And Vomiting  . Morphine     REACTION: nausea, rash, itching, vomiting  . Narcan [Naloxone Hcl] Other (See Comments)    Pt was not her self  . Nsaids     Bowel  irregularity  . Venlafaxine Other (See Comments)    Reaction unknown    Social History   Socioeconomic History  . Marital status: Divorced    Spouse name: Not on file  . Number of children: 1  . Years of education: Not on file  . Highest education level: Not on file  Occupational History  . Occupation: Disabled    Fish farm manager: UNEMPLOYED  Social Needs  . Financial resource strain: Not hard at all  . Food insecurity    Worry: Never true    Inability: Never true  . Transportation needs    Medical: No    Non-medical: No  Tobacco Use  . Smoking status: Former Smoker    Packs/day: 0.10    Years: 27.00    Pack years: 2.70    Types: Cigarettes    Quit date: 06/09/2011    Years since quitting: 7.9  . Smokeless tobacco: Never Used  Substance and Sexual Activity  . Alcohol use: No    Alcohol/week: 0.0 standard drinks    Comment: 07/14/2016 "nothing since the early 1990s"  . Drug use: No  . Sexual activity: Not Currently  Lifestyle  . Physical activity    Days per week: 0 days    Minutes per session: 0 min  . Stress: To some extent  Relationships  . Social connections    Talks on phone: More than three times a week    Gets together: More than three times a week    Attends religious service: 1 to 4 times per year    Active member of club or organization: No    Attends meetings of clubs or organizations: Never    Relationship status: Divorced  . Intimate partner violence    Fear of current or ex partner: Not on file    Emotionally abused: Not on file    Physically abused: Not on file    Forced sexual activity: Not on file  Other Topics Concern  . Not on file  Social History Narrative   Current smoker wi last 12 mos.       Family History  Problem Relation Age of Onset  . Heart disease Mother   . Heart disease Father   . Crohn's disease Sister      Review of Systems  Constitutional: Negative for fever and unexpected weight change.  HENT: Positive for dental  problem, sore throat and trouble swallowing. Negative for congestion, ear pain, nosebleeds, postnasal drip, rhinorrhea, sinus pressure and sneezing.   Eyes: Negative for redness and itching.  Respiratory: Positive for shortness of breath. Negative for cough, chest tightness and wheezing.   Cardiovascular: Negative for palpitations and leg swelling.  Gastrointestinal: Positive for abdominal pain. Negative for nausea and vomiting.  Genitourinary: Negative for dysuria.  Musculoskeletal: Positive for joint swelling.  Skin: Negative for rash.  Neurological: Positive for headaches.  Hematological: Does not bruise/bleed easily.  Psychiatric/Behavioral: Negative for dysphoric mood. The patient is not nervous/anxious.        Objective:   Physical Exam  Gen. Pleasant, obese, in no distress, normal affect ENT - no pallor,icterus, no post nasal drip, class 2-3 airway Neck: No JVD, no thyromegaly, no carotid bruits Lungs: no use of accessory muscles, no dullness to percussion, decreased without rales or rhonchi  Cardiovascular: Rhythm regular, heart sounds  normal, no murmurs or gallops, 2+ peripheral edema with stasis changes Abdomen: soft and non-tender, no hepatosplenomegaly, BS normal. Musculoskeletal: No deformities, no cyanosis or clubbing Neuro:  alert, non focal, no tremors       Assessment & Plan:

## 2019-05-30 NOTE — Assessment & Plan Note (Signed)
Discuss with Coumadin clinic about getting on Xarelto

## 2019-05-30 NOTE — Assessment & Plan Note (Signed)
Albuterol as needed Refer to lung cancer screening program If she does not qualify, then we can order high-resolution CT chest to rule out ILD because she does have some groundglass on her CT from 2017

## 2019-05-30 NOTE — Patient Instructions (Addendum)
Refer to lung cancer screening program -if you do not qualify, we can reassess need for CT scan  Discuss with Coumadin clinic about getting on Xarelto

## 2019-05-31 ENCOUNTER — Ambulatory Visit (INDEPENDENT_AMBULATORY_CARE_PROVIDER_SITE_OTHER): Payer: Medicare Other | Admitting: General Practice

## 2019-05-31 DIAGNOSIS — Z86711 Personal history of pulmonary embolism: Secondary | ICD-10-CM

## 2019-05-31 DIAGNOSIS — Z86718 Personal history of other venous thrombosis and embolism: Secondary | ICD-10-CM | POA: Diagnosis not present

## 2019-05-31 DIAGNOSIS — Z7901 Long term (current) use of anticoagulants: Secondary | ICD-10-CM

## 2019-05-31 LAB — POCT INR: INR: 2.7 (ref 2.0–3.0)

## 2019-05-31 NOTE — Progress Notes (Signed)
Agree with management.  Rosezetta Balderston J Zanyia Silbaugh, MD  

## 2019-05-31 NOTE — Patient Instructions (Signed)
Pre visit review using our clinic review tool, if applicable. No additional management support is needed unless otherwise documented below in the visit note.  Continue to take 1 1/2 tablets daily except 1 tablet on Monday and Thursdays.  Re-check in 2 weeks.   Patient is using [employer] home monitoring.  Left dosing instructions on patient's VM and asked pt to call back to verify.

## 2019-06-03 ENCOUNTER — Telehealth: Payer: Self-pay | Admitting: *Deleted

## 2019-06-03 DIAGNOSIS — Z87891 Personal history of nicotine dependence: Secondary | ICD-10-CM

## 2019-06-03 DIAGNOSIS — Z122 Encounter for screening for malignant neoplasm of respiratory organs: Secondary | ICD-10-CM

## 2019-06-03 NOTE — Telephone Encounter (Signed)
Spoke with pt and scheduled SDMV 06/24/19 2:00 CT ordered Nothing further needed

## 2019-06-06 ENCOUNTER — Other Ambulatory Visit: Payer: Self-pay | Admitting: Internal Medicine

## 2019-06-07 DIAGNOSIS — Z1231 Encounter for screening mammogram for malignant neoplasm of breast: Secondary | ICD-10-CM | POA: Diagnosis not present

## 2019-06-07 DIAGNOSIS — N813 Complete uterovaginal prolapse: Secondary | ICD-10-CM | POA: Insufficient documentation

## 2019-06-07 DIAGNOSIS — Z01419 Encounter for gynecological examination (general) (routine) without abnormal findings: Secondary | ICD-10-CM | POA: Diagnosis not present

## 2019-06-07 DIAGNOSIS — K439 Ventral hernia without obstruction or gangrene: Secondary | ICD-10-CM | POA: Diagnosis not present

## 2019-06-07 DIAGNOSIS — N8110 Cystocele, unspecified: Secondary | ICD-10-CM | POA: Diagnosis not present

## 2019-06-07 DIAGNOSIS — G8929 Other chronic pain: Secondary | ICD-10-CM | POA: Insufficient documentation

## 2019-06-13 ENCOUNTER — Telehealth: Payer: Self-pay | Admitting: Gastroenterology

## 2019-06-13 NOTE — Telephone Encounter (Signed)
Not a referral, these are office notes that would have been faxed straight to Dr. Loletha Carrow.    FYI- Called the patient and requested the patient ask her OB-GYN to re-fax office notes to the attn: Dr. Wilfrid Lund. Please be on the lookout for this fax as I gave her [facility] fax number. The patient was also notified that she has not been seen since 2018 and depending on OB's notes, Dr. Loletha Carrow may want to see the patient in the clinic for an updated OV.

## 2019-06-13 NOTE — Telephone Encounter (Signed)
If it came as a referral its given to the schedulers to book. I wouldn't get the records. You may need to check with them, as I haven't seen anything.

## 2019-06-13 NOTE — Telephone Encounter (Signed)
Pt called asking if Dr. Loletha Carrow received a copy of her annual check-up with her obgyn Dr. Alfred Levins from [facility].Pt states that during her annual exam Dr. Alfred Levins detected a hernia, she told pt that she faxed a copy of her notes to Dr. Loletha Carrow for review.

## 2019-06-13 NOTE — Telephone Encounter (Signed)
[name], this patient stated Dr. Alfred Levins (OB-GYN) faxed a copy of her office notes to Dr. Loletha Carrow concerning a hernia. Have you received any paperwork from [facility]?

## 2019-06-14 LAB — POCT INR: INR: 3 (ref 2.0–3.0)

## 2019-06-14 NOTE — Telephone Encounter (Signed)
Received office notes that were re-faxed to Dr. Loletha Carrow. Placed on his desk for review.

## 2019-06-17 ENCOUNTER — Ambulatory Visit (INDEPENDENT_AMBULATORY_CARE_PROVIDER_SITE_OTHER): Payer: Medicare Other | Admitting: General Practice

## 2019-06-17 DIAGNOSIS — Z7901 Long term (current) use of anticoagulants: Secondary | ICD-10-CM | POA: Diagnosis not present

## 2019-06-17 NOTE — Patient Instructions (Signed)
Pre visit review using our clinic review tool, if applicable. No additional management support is needed unless otherwise documented below in the visit note.  Continue to take 1 1/2 tablets daily except 1 tablet on Monday and Thursdays.  Re-check in 2 weeks.   Patient is using [employer] home monitoring.  Left dosing instructions on patient's VM and asked pt to call back to verify.

## 2019-06-18 NOTE — Telephone Encounter (Signed)
I reviewed the recent ob/gyn note.  Last seen here 08/2017 for colonoscopy If the patient would like to see me, please set up an appointment.

## 2019-06-19 NOTE — Telephone Encounter (Signed)
Follow up scheduled for 07-23-2019.

## 2019-06-19 NOTE — Telephone Encounter (Signed)
Noted  

## 2019-06-19 NOTE — Telephone Encounter (Signed)
No answer and no voicemail box. I sent a mychart message to pt.

## 2019-06-24 ENCOUNTER — Ambulatory Visit (INDEPENDENT_AMBULATORY_CARE_PROVIDER_SITE_OTHER): Payer: Medicare Other | Admitting: Acute Care

## 2019-06-24 ENCOUNTER — Encounter: Payer: Self-pay | Admitting: Acute Care

## 2019-06-24 ENCOUNTER — Other Ambulatory Visit: Payer: Self-pay

## 2019-06-24 ENCOUNTER — Ambulatory Visit
Admission: RE | Admit: 2019-06-24 | Discharge: 2019-06-24 | Disposition: A | Discharge status: Home / Self Care | Payer: Medicare Other | Source: Ambulatory Visit | Attending: Acute Care | Admitting: Acute Care

## 2019-06-24 VITALS — BP 116/68 | HR 68 | Temp 97.2°F | Ht 68.0 in | Wt 323.2 lb

## 2019-06-24 DIAGNOSIS — Z87891 Personal history of nicotine dependence: Secondary | ICD-10-CM | POA: Diagnosis not present

## 2019-06-24 DIAGNOSIS — Z122 Encounter for screening for malignant neoplasm of respiratory organs: Secondary | ICD-10-CM

## 2019-06-24 NOTE — Progress Notes (Signed)
Shared Decision Making Visit Lung Cancer Screening Program [phone])   Eligibility:  Age 63 y.o.  Pack Years Smoking History Calculation : 35 pack year smoking history (# packs/per year x # years smoked)  Recent History of coughing up blood  no  Unexplained weight loss? no ( >Than 15 pounds within the last 6 months )  Prior History Lung / other cancer no (Diagnosis within the last 5 years already requiring surveillance chest CT Scans).  Smoking Status Former Smoker  Former Smokers: Years since quit: 8 years  Quit Date: 2021  Visit Components:  Discussion included one or more decision making aids. yes  Discussion included risk/benefits of screening. yes  Discussion included potential follow up diagnostic testing for abnormal scans. yes  Discussion included meaning and risk of over diagnosis. yes  Discussion included meaning and risk of False Positives. yes  Discussion included meaning of total radiation exposure. yes  Counseling Included:  Importance of adherence to annual lung cancer LDCT screening. yes  Impact of comorbidities on ability to participate in the program. yes  Ability and willingness to under diagnostic treatment. yes  Smoking Cessation Counseling:  Current Smokers:   Discussed importance of smoking cessation. NA, former smoker  Information about tobacco cessation classes and interventions provided to patient. yes  Patient provided with "ticket" for LDCT Scan. yes  Symptomatic Patient. no  CounselingNA  Diagnosis Code: Tobacco Use Z72.0  Asymptomatic Patient yes  Counseling (Intermediate counseling: > three minutes counseling) [id number]  Former Smokers:   Discussed the importance of maintaining cigarette abstinence. yes  Diagnosis Code: Personal History of Nicotine Dependence. [id number]  Information about tobacco cessation classes and interventions provided to patient. Yes  Patient provided with "ticket" for LDCT Scan. yes  Written Order  for Lung Cancer Screening with LDCT placed in Epic. Yes (CT Chest Lung Cancer Screening Low Dose W/O CM) [id number] Z12.2-Screening of respiratory organs Z87.891-Personal history of nicotine dependence  I spent 25 minutes of face to face time with Ms. Lout discussing the risks and benefits of lung cancer screening. We viewed a power point together that explained in detail the above noted topics. We took the time to pause the power point at intervals to allow for questions to be asked and answered to ensure understanding. We discussed that she had taken the single most powerful action possible to decrease her risk of developing lung cancer when she quit smoking. I counseled her to remain smoke free, and to contact me if she ever had the desire to smoke again so that I can provide resources and tools to help support the effort to remain smoke free. We discussed the time and location of the scan, and that either  Doroteo Glassman RN or I will call with the results within  24-48 hours of receiving them. She has my card and contact information in the event she needs to speak with me, in addition to a copy of the power point we reviewed as a resource. She verbalized understanding of all of the above and had no further questions upon leaving the office.     I explained to the patient that there has been a high incidence of coronary artery disease noted on these exams. I explained that this is a non-gated exam therefore degree or severity cannot be determined. This patient is not  on statin therapy. I have asked the patient to follow-up with their PCP regarding any incidental finding of coronary artery disease and management with diet or medication as  they feel is clinically indicated. The patient verbalized understanding of the above and had no further questions.     Magdalen Spatz, NP 06/24/2019 3:12 PM

## 2019-06-24 NOTE — Patient Instructions (Signed)
Thank you for participating in the [facility]. It was our pleasure to meet you today. We will call you with the results of your scan within the next few days. Your scan will be assigned a Lung RADS category score by the physicians reading the scans.  This Lung RADS score determines follow up scanning.  See below for description of categories, and follow up screening recommendations. We will be in touch to schedule your follow up screening annually or based on recommendations of our providers. We will fax a copy of your scan results to your Primary Care Physician, or the physician who referred you to the program, to ensure they have the results. Please call the office if you have any questions or concerns regarding your scanning experience or results.  Our office number is [phone]. Please speak with Denise Phelps, RN. She is our Lung Cancer Screening RN. If she is unavailable when you call, please have the office staff send her a message. She will return your call at her earliest convenience. Remember, if your scan is normal, we will scan you annually as long as you continue to meet the criteria for the program. (Age 55-77, Current smoker or smoker who has quit within the last 15 years). If you are a smoker, remember, quitting is the single most powerful action that you can take to decrease your risk of lung cancer and other pulmonary, breathing related problems. We know quitting is hard, and we are here to help.  Please let us know if there is anything we can do to help you meet your goal of quitting. If you are a former smoker, congratulations. We are proud of you! Remain smoke free! Remember you can refer friends or family members through the number above.  We will screen them to make sure they meet criteria for the program. Thank you for helping us take better care of you by participating in Lung Screening.  Lung RADS Categories:  Lung RADS 1: no nodules  or definitely non-concerning nodules.  Recommendation is for a repeat annual scan in 12 months.  Lung RADS 2:  nodules that are non-concerning in appearance and behavior with a very low likelihood of becoming an active cancer. Recommendation is for a repeat annual scan in 12 months.  Lung RADS 3: nodules that are probably non-concerning , includes nodules with a low likelihood of becoming an active cancer.  Recommendation is for a 6-month repeat screening scan. Often noted after an upper respiratory illness. We will be in touch to make sure you have no questions, and to schedule your 6-month scan.  Lung RADS 4 A: nodules with concerning findings, recommendation is most often for a follow up scan in 3 months or additional testing based on our provider's assessment of the scan. We will be in touch to make sure you have no questions and to schedule the recommended 3 month follow up scan.  Lung RADS 4 B:  indicates findings that are concerning. We will be in touch with you to schedule additional diagnostic testing based on our provider's  assessment of the scan.   

## 2019-07-03 ENCOUNTER — Telehealth: Payer: Self-pay | Admitting: Internal Medicine

## 2019-07-03 ENCOUNTER — Other Ambulatory Visit: Payer: Self-pay

## 2019-07-03 NOTE — Telephone Encounter (Signed)
Continue current warfarin dose!

## 2019-07-03 NOTE — Telephone Encounter (Signed)
Jenny Reichmann is out of the office until 07/08/2019  Home INR results 07/01/2019  3.1 INR Range 2-3

## 2019-07-03 NOTE — Telephone Encounter (Signed)
Pt aware of response.  

## 2019-07-03 NOTE — Telephone Encounter (Signed)
Patient called and would like to talk to someone about her coumadin reading of 3.0. She stated that her range is 2.-3.0 but it has not been up that high before. Please call patient back, thanks.

## 2019-07-03 NOTE — Telephone Encounter (Signed)
Patient's reading is 3.1, not critical right now---she has eaten some foods that are a little more vitamin K rich than she usually does---patient agrees to limit these foods thru rest of week and recheck INR on Friday 11/27---if numbers have worsened considerably, she will either mychart message dr burns or go to [facility]---also if numbers are not responding well to her change in diet, patient agrees to call cindy/CC nurse on Monday 11/30 to see if cindy needs to see her in the office

## 2019-07-03 NOTE — Telephone Encounter (Signed)
Patient is returning a call to [place].  Tried the office 2x, but no answer.  Please call patient back at [phone]

## 2019-07-03 NOTE — Telephone Encounter (Signed)
lvm asking patient to call back and talk with tamara,RN at [facility] offfice

## 2019-07-08 ENCOUNTER — Telehealth: Payer: Self-pay | Admitting: *Deleted

## 2019-07-08 DIAGNOSIS — Z122 Encounter for screening for malignant neoplasm of respiratory organs: Secondary | ICD-10-CM

## 2019-07-08 DIAGNOSIS — Z87891 Personal history of nicotine dependence: Secondary | ICD-10-CM

## 2019-07-08 NOTE — Telephone Encounter (Signed)
LMTC x 1  

## 2019-07-09 NOTE — Telephone Encounter (Signed)
Pt calling back regarding results.  States she took block off her phone.  [phone]

## 2019-07-10 ENCOUNTER — Ambulatory Visit: Payer: Medicare Other

## 2019-07-10 NOTE — Telephone Encounter (Signed)
Pt informed of CT results per Sarah Groce, NP.  PT verbalized understanding.  Copy sent to PCP.  Order placed for 1 yr f/u CT.  

## 2019-07-15 ENCOUNTER — Telehealth: Payer: Self-pay | Admitting: Gastroenterology

## 2019-07-15 ENCOUNTER — Ambulatory Visit (INDEPENDENT_AMBULATORY_CARE_PROVIDER_SITE_OTHER): Payer: Medicare Other | Admitting: General Practice

## 2019-07-15 ENCOUNTER — Encounter: Payer: Self-pay | Admitting: Internal Medicine

## 2019-07-15 DIAGNOSIS — Z7901 Long term (current) use of anticoagulants: Secondary | ICD-10-CM

## 2019-07-15 DIAGNOSIS — Z86711 Personal history of pulmonary embolism: Secondary | ICD-10-CM | POA: Diagnosis not present

## 2019-07-15 DIAGNOSIS — Z86718 Personal history of other venous thrombosis and embolism: Secondary | ICD-10-CM | POA: Diagnosis not present

## 2019-07-15 LAB — POCT INR: INR: 3.7 — AB (ref 2.0–3.0)

## 2019-07-15 MED ORDER — HYOSCYAMINE SULFATE 0.125 MG SL SUBL: 0.1250 mg | SUBLINGUAL_TABLET | SUBLINGUAL | 0 refills | Status: DC

## 2019-07-15 NOTE — Progress Notes (Signed)
Agree with management.  Madelynn Malson J Vertie Dibbern, MD  

## 2019-07-15 NOTE — Patient Instructions (Signed)
Pre visit review using our clinic review tool, if applicable. No additional management support is needed unless otherwise documented below in the visit note.  Skip coumadin today and then change dosage and take 1 1/2 tablets daily except 1 tablet on Monday Wednesday and Fridays.  Re-check in 2 weeks.   Patient is using [employer] home monitoring.  Dosing instructions given to patient and she did verbalize understanding.

## 2019-07-15 NOTE — Telephone Encounter (Signed)
Done. Must keep follow up for refills.

## 2019-07-19 ENCOUNTER — Encounter: Payer: Self-pay | Admitting: Internal Medicine

## 2019-07-23 ENCOUNTER — Ambulatory Visit: Payer: Medicare Other | Admitting: Gastroenterology

## 2019-07-26 ENCOUNTER — Other Ambulatory Visit: Payer: Self-pay | Admitting: Nurse Practitioner

## 2019-08-04 ENCOUNTER — Encounter: Payer: Self-pay | Admitting: Internal Medicine

## 2019-08-09 ENCOUNTER — Encounter: Payer: Self-pay | Admitting: Internal Medicine

## 2019-08-12 ENCOUNTER — Ambulatory Visit (INDEPENDENT_AMBULATORY_CARE_PROVIDER_SITE_OTHER): Payer: Medicare HMO | Admitting: General Practice

## 2019-08-12 DIAGNOSIS — Z86711 Personal history of pulmonary embolism: Secondary | ICD-10-CM

## 2019-08-12 DIAGNOSIS — Z7901 Long term (current) use of anticoagulants: Secondary | ICD-10-CM

## 2019-08-12 LAB — POCT INR: INR: 2.3 (ref 2.0–3.0)

## 2019-08-12 NOTE — Patient Instructions (Signed)
Pre visit review using our clinic review tool, if applicable. No additional management support is needed unless otherwise documented below in the visit note. Patient has been taking 5 mg daily.  I asked her to increase to 5 mg daily and 7.5 mg on Mondays and Fridays.  Re-check on Friday 1/8.   Patient is using [employer] home monitoring.  Dosing instructions given to patient and she did verbalize understanding.

## 2019-08-16 ENCOUNTER — Ambulatory Visit (INDEPENDENT_AMBULATORY_CARE_PROVIDER_SITE_OTHER): Payer: Medicare HMO | Admitting: General Practice

## 2019-08-16 DIAGNOSIS — Z86711 Personal history of pulmonary embolism: Secondary | ICD-10-CM

## 2019-08-16 DIAGNOSIS — Z7901 Long term (current) use of anticoagulants: Secondary | ICD-10-CM | POA: Diagnosis not present

## 2019-08-16 LAB — POCT INR: INR: 2.2 (ref 2.0–3.0)

## 2019-08-16 NOTE — Patient Instructions (Addendum)
.  lbpcmh  Continue to take  5 mg daily and 7.5 mg on Mondays and Fridays.  Re-check in 2 weeks.  Patient is using [employer] home monitoring.  Dosing instructions given to patient and she did verbalize understanding.

## 2019-08-16 NOTE — Progress Notes (Signed)
Agree with management.  [name] J Kaelee Pfeffer, MD  

## 2019-08-22 ENCOUNTER — Encounter: Payer: Self-pay | Admitting: Internal Medicine

## 2019-08-23 ENCOUNTER — Ambulatory Visit: Payer: Medicare HMO | Admitting: Gastroenterology

## 2019-08-23 ENCOUNTER — Encounter: Payer: Self-pay | Admitting: Gastroenterology

## 2019-08-23 VITALS — BP 116/79 | HR 84 | Temp 98.0°F | Ht 65.0 in | Wt 331.2 lb

## 2019-08-23 DIAGNOSIS — K5903 Drug induced constipation: Secondary | ICD-10-CM | POA: Diagnosis not present

## 2019-08-23 DIAGNOSIS — K439 Ventral hernia without obstruction or gangrene: Secondary | ICD-10-CM

## 2019-08-23 DIAGNOSIS — R1010 Upper abdominal pain, unspecified: Secondary | ICD-10-CM | POA: Diagnosis not present

## 2019-08-23 DIAGNOSIS — T402X5A Adverse effect of other opioids, initial encounter: Secondary | ICD-10-CM

## 2019-08-23 DIAGNOSIS — Z86010 Personal history of colonic polyps: Secondary | ICD-10-CM | POA: Diagnosis not present

## 2019-08-23 NOTE — Progress Notes (Signed)
[facility] Progress Note  Chief Complaint: Abdominal pain and hernia  Subjective  History: Last seen Jan 2019 for colonoscopy due to positive Cologuard. Left-sided diverticulosis and 3 diminutive adenomas found. Recall was placed for 3 years, however with new guidelines we will change it to 5 years. Has OIC.  Had routine appointment with OB/GYN a few months back, reportedly detected a hernia and advised her to see me. That office note by Dr. Lucillie Garfinkel 06/07/19 reviewed.  Jhane has generalized abdominal bloating, chronic constipation for which she takes 6 laxative tablets a day to have some intermittent loose stool.  She reports trial of Amitiza in the past and feel lightheaded and dizzy, and Linzess cause stool to be too loose and gave her incontinence. When she bends over she has a bandlike pain and firmness in the upper abdomen.  She has been very concerned since her OB/GYN visit suggesting that she had a hernia.  She has a history of some kind of gastric weight loss surgery decades ago, and a subsequent cholecystectomy that she says was done through the same upper midline incision.  ROS: Cardiovascular:  no chest pain Respiratory: no dyspnea Chronic abdominal pain, chronic back pain, chronic orthopedic pain Has been having some palpitations and feeling of her heart racing intermittently.  This apparently occurred in the past and she brought it to attention of primary care, but says it is more frequent lately including this morning before she came to the clinic.  Remainder systems negative except as above The patient's Past Medical, Family and Social History were reviewed and are on file in the EMR.  Objective:  Med list reviewed  Current Outpatient Medications:  .  bisacodyl (BISACODYL LAXATIVE) 5 MG EC tablet, Take 30 mg by mouth daily as needed for moderate constipation., Disp: , Rfl:  .  calcium carbonate (TUMS - DOSED IN MG ELEMENTAL CALCIUM) 500 MG chewable  tablet, Chew 1 tablet by mouth as needed for indigestion or heartburn., Disp: , Rfl:  .  HYDROmorphone (DILAUDID) 4 MG tablet, TAKE 1 TO 2 TABLETS BY MOUTH EVERY 6 HOURS AS NEEDED FOR BREAKTHROUGH PAIN, Disp: , Rfl: 0 .  hyoscyamine (LEVSIN SL) 0.125 MG SL tablet, Place 1 tablet (0.125 mg total) under the tongue as directed. Take 1-2 daily as needed., Disp: 30 tablet, Rfl: 0 .  levothyroxine (SYNTHROID) 200 MCG tablet, Take 1 tablet (200 mcg total) by mouth daily before breakfast., Disp: 90 tablet, Rfl: 2 .  levothyroxine (SYNTHROID) 25 MCG tablet, TAKE 1 TAB BY MOUTH DAILY BEFORE BREAKFAST. TAKE IN ADDITION TO 200 MCG TOTALING 225 MCG DAILY **OFFICE VISIT DUE**, Disp: 90 tablet, Rfl: 0 .  methadone (DOLOPHINE) 10 MG tablet, Take 10 mg by mouth every 8 (eight) hours as needed. for pain, Disp: , Rfl: 0 .  nystatin (MYCOSTATIN/NYSTOP) powder, Apply topically 4 (four) times daily., Disp: 60 g, Rfl: 5 .  vitamin B-12 (CYANOCOBALAMIN) 1000 MCG tablet, Take 1 tablet (1,000 mcg total) by mouth daily., Disp: , Rfl:  .  warfarin (COUMADIN) 5 MG tablet, TAKE 1 TABLET DAILY EXCEPT 2 TABLETS ON WED. OR AS DIRECTED BY ANTICOAGULATION CLINIC, Disp: 105 tablet, Rfl: 1   Vital signs in last 24 hrs: Vitals:   08/23/19 1027  BP: 116/79  Pulse: 84  Temp: 98 F (36.7 C)  SpO2: 98%    Physical Exam  No acute distress, pleasant and conversational, antalgic gait, unable to get on exam table  HEENT: sclera anicteric, oral mucosa  moist without lesions  Neck: supple, no thyromegaly, JVD or lymphadenopathy (difficult to assess given body habitus)  Cardiac: RRR, , heart sounds distant, no peripheral edema, pulse regular  Pulm: clear to auscultation bilaterally, normal RR and effort noted  Abdomen: soft, mild upper midline tenderness over short vertical midline incision, difficult to tell if she has a hernia with exam seated in chair and body habitus. active bowel sounds.  Skin; warm and dry, no jaundice or  rash   @ASSESSMENTPLANBEGIN @ Assessment: Encounter Diagnoses  Name Primary?  . Abdominal wall hernia Yes  . Therapeutic opioid induced constipation   . Upper abdominal pain   . Personal history of colonic polyps    She may have an incisional hernia, it cannot be determined on exam with certainty due to her body habitus.  Even if she does, she is not a surgical candidate due to her morbid obesity.  I do not think it is causing bowel obstruction or other complications.  She has longstanding opioid-induced constipation, insufficient response to current therapy, some other therapies in the past either did not work or had undesirable side effects. I discussed a trial of Movantik, and she was agreeable.  She understands it may cause abdominal pain, dizziness, diarrhea.  Plan: We gave her a 6-day supply of Movantik 25 mg tablets, directions to take 1 in the morning about an hour before breakfast. I would like her to call us next week with an update on how that is working and tolerated.  If things are going well, I will prescribe it for continued use.  Lastly, due to updated colon polyp surveillance guidelines, her recall will be moved back to 5 years.  She had some concerns about that, but I reassured her it is based on good data and thoughtfully considered updated guidelines.  Total time 30 minutes, over half spent face-to-face with patient in counseling and coordination of care.   [name]

## 2019-08-23 NOTE — Patient Instructions (Addendum)
If you are age 66 or older, your body mass index should be between 23-30. Your Body mass index is 55.11 kg/m. If this is out of the aforementioned range listed, please consider follow up with your Primary Care Provider.  If you are age 65 or younger, your body mass index should be between 19-25. Your Body mass index is 55.11 kg/m. If this is out of the aformentioned range listed, please consider follow up with your Primary Care Provider.   Medication Samples have been provided to the patient.  Drug name: Movantik       Strength: 25mg         Qty: 6 tablets  LOT: [id number]  Exp.Date: 11-2021  Dosing instructions: one tablet a day, in the morning 1 hour before eating  The patient has been instructed regarding the correct time, dose, and frequency of taking this medication, including desired effects and most common side effects.   You will be due for a recall colonoscopy in 08-2022. We will send you a reminder in the mail when it gets closer to that time.   It was a pleasure to see you today!  Dr. Loletha Carrow

## 2019-08-30 ENCOUNTER — Ambulatory Visit (INDEPENDENT_AMBULATORY_CARE_PROVIDER_SITE_OTHER): Payer: Medicare HMO | Admitting: General Practice

## 2019-08-30 DIAGNOSIS — Z86711 Personal history of pulmonary embolism: Secondary | ICD-10-CM

## 2019-08-30 DIAGNOSIS — Z7901 Long term (current) use of anticoagulants: Secondary | ICD-10-CM | POA: Diagnosis not present

## 2019-08-30 DIAGNOSIS — Z86718 Personal history of other venous thrombosis and embolism: Secondary | ICD-10-CM | POA: Diagnosis not present

## 2019-08-30 LAB — POCT INR: INR: 2.5 (ref 2.0–3.0)

## 2019-08-30 NOTE — Patient Instructions (Signed)
Pre visit review using our clinic review tool, if applicable. No additional management support is needed unless otherwise documented below in the visit note.  Continue to take  5 mg daily and 7.5 mg on Mondays and Fridays.  Re-check in 2 weeks.  Patient is using [employer] home monitoring.  Dosing instructions given to patient and she did verbalize understanding.

## 2019-08-31 NOTE — Progress Notes (Signed)
Agree with management.  Derk Doubek J Kebin Maye, MD  

## 2019-09-05 ENCOUNTER — Encounter: Payer: Self-pay | Admitting: Internal Medicine

## 2019-09-09 ENCOUNTER — Encounter: Payer: Self-pay | Admitting: Internal Medicine

## 2019-09-13 LAB — POCT INR: INR: 3.5 — AB (ref 2.0–3.0)

## 2019-09-16 ENCOUNTER — Ambulatory Visit (INDEPENDENT_AMBULATORY_CARE_PROVIDER_SITE_OTHER): Payer: Medicare HMO | Admitting: General Practice

## 2019-09-16 DIAGNOSIS — Z86711 Personal history of pulmonary embolism: Secondary | ICD-10-CM

## 2019-09-16 DIAGNOSIS — Z7901 Long term (current) use of anticoagulants: Secondary | ICD-10-CM | POA: Diagnosis not present

## 2019-09-16 LAB — POCT INR: INR: 4.5 — AB (ref 2.0–3.0)

## 2019-09-16 NOTE — Patient Instructions (Signed)
Pre visit review using our clinic review tool, if applicable. No additional management support is needed unless otherwise documented below in the visit note.  Hold coumadin today and tomorrow and then continue to take  5 mg daily and 7.5 mg on Mondays and Fridays.  Re-check in [place].  Patient is using [employer] home monitoring.  Dosing instructions given to patient and she did verbalize understanding.

## 2019-09-20 ENCOUNTER — Ambulatory Visit (INDEPENDENT_AMBULATORY_CARE_PROVIDER_SITE_OTHER): Payer: Medicare HMO | Admitting: General Practice

## 2019-09-20 DIAGNOSIS — Z7901 Long term (current) use of anticoagulants: Secondary | ICD-10-CM | POA: Diagnosis not present

## 2019-09-20 DIAGNOSIS — Z86711 Personal history of pulmonary embolism: Secondary | ICD-10-CM | POA: Diagnosis not present

## 2019-09-20 LAB — POCT INR: INR: 2.1 (ref 2.0–3.0)

## 2019-09-20 NOTE — Progress Notes (Signed)
Agree with management.  Carmen Pangallo J Cypress Hinkson, MD  

## 2019-09-20 NOTE — Patient Instructions (Signed)
Pre visit review using our clinic review tool, if applicable. No additional management support is needed unless otherwise documented below in the visit note.  Continue to take  5 mg daily and 7.5 mg on Mondays and Fridays.  Re-check in 2 weeks.  Patient is using [employer] home monitoring.  Dosing instructions given to patient and she did verbalize understanding.

## 2019-09-28 ENCOUNTER — Encounter: Payer: Self-pay | Admitting: Internal Medicine

## 2019-09-29 ENCOUNTER — Encounter: Payer: Self-pay | Admitting: Internal Medicine

## 2019-10-04 ENCOUNTER — Ambulatory Visit (INDEPENDENT_AMBULATORY_CARE_PROVIDER_SITE_OTHER): Payer: Medicare HMO | Admitting: General Practice

## 2019-10-04 DIAGNOSIS — Z7901 Long term (current) use of anticoagulants: Secondary | ICD-10-CM | POA: Diagnosis not present

## 2019-10-04 DIAGNOSIS — Z86711 Personal history of pulmonary embolism: Secondary | ICD-10-CM

## 2019-10-04 LAB — POCT INR: INR: 3.6 — AB (ref 2.0–3.0)

## 2019-10-04 NOTE — Patient Instructions (Signed)
Pre visit review using our clinic review tool, if applicable. No additional management support is needed unless otherwise documented below in the visit note.  Hold coumadin today and then take 1 tablet daily except 1 1/2 tablets on Mondays only. Re-check in 2 weeks.  Patient is using [employer] home monitoring.  Dosing instructions given to patient and she did verbalize understanding.

## 2019-10-05 NOTE — Progress Notes (Signed)
Agree with management.  Kalven Ganim J Jaedah Lords, MD  

## 2019-10-09 ENCOUNTER — Encounter: Payer: Self-pay | Admitting: Internal Medicine

## 2019-10-10 ENCOUNTER — Other Ambulatory Visit: Payer: Self-pay

## 2019-10-10 MED ORDER — LEVOTHYROXINE SODIUM 200 MCG PO TABS: 200.0000 ug | ORAL_TABLET | Freq: Every day | ORAL | 0 refills | Status: DC

## 2019-10-10 MED ORDER — LEVOTHYROXINE SODIUM 25 MCG PO TABS: ORAL_TABLET | ORAL | 0 refills | Status: DC

## 2019-10-17 ENCOUNTER — Ambulatory Visit (INDEPENDENT_AMBULATORY_CARE_PROVIDER_SITE_OTHER): Payer: Medicare HMO | Admitting: General Practice

## 2019-10-17 DIAGNOSIS — Z86711 Personal history of pulmonary embolism: Secondary | ICD-10-CM

## 2019-10-17 DIAGNOSIS — Z7901 Long term (current) use of anticoagulants: Secondary | ICD-10-CM

## 2019-10-17 DIAGNOSIS — Z86718 Personal history of other venous thrombosis and embolism: Secondary | ICD-10-CM | POA: Diagnosis not present

## 2019-10-17 LAB — POCT INR: INR: 3.7 — AB (ref 2.0–3.0)

## 2019-10-17 NOTE — Patient Instructions (Signed)
Pre visit review using our clinic review tool, if applicable. No additional management support is needed unless otherwise documented below in the visit note.  Hold coumadin today and then take 1 tablet daily.  Re-check in 2 weeks.  Patient is using [employer] home monitoring.  Dosing instructions given to patient and she did verbalize understanding.

## 2019-10-17 NOTE — Progress Notes (Signed)
Agree with management.  Stacy J Burns, MD  

## 2019-11-05 ENCOUNTER — Ambulatory Visit (INDEPENDENT_AMBULATORY_CARE_PROVIDER_SITE_OTHER): Payer: Medicare HMO | Admitting: General Practice

## 2019-11-05 DIAGNOSIS — Z86711 Personal history of pulmonary embolism: Secondary | ICD-10-CM

## 2019-11-05 DIAGNOSIS — Z7901 Long term (current) use of anticoagulants: Secondary | ICD-10-CM | POA: Diagnosis not present

## 2019-11-05 LAB — POCT INR: INR: 2.4 (ref 2.0–3.0)

## 2019-11-05 NOTE — Patient Instructions (Addendum)
Pre visit review using our clinic review tool, if applicable. No additional management support is needed unless otherwise documented below in the visit note. Continue to take 1 tablet daily.  Re-check in 2 weeks.  Patient is using [employer] home monitoring.  Dosing instructions given to patient and she did verbalize understanding.

## 2019-11-05 NOTE — Progress Notes (Signed)
Agree with management.  Niomie Englert J Ty Oshima, MD  

## 2019-11-10 ENCOUNTER — Other Ambulatory Visit: Payer: Self-pay | Admitting: Internal Medicine

## 2019-11-14 LAB — POCT INR: INR: 2.1 (ref 2.0–3.0)

## 2019-11-21 LAB — POCT INR: INR: 2.1 (ref 2.0–3.0)

## 2019-11-22 ENCOUNTER — Ambulatory Visit: Payer: Medicare Other | Admitting: Internal Medicine

## 2019-11-24 NOTE — Patient Instructions (Addendum)
  Blood work was ordered.     Medications reviewed and updated.  Changes include :   none  Your prescription(s) have been submitted to your pharmacy. Please take as directed and contact our office if you believe you are having problem(s) with the medication(s).  If your tongue lesion does not go away let us know and I will refer you to ENT.    Consider seeing vascular for your leg swelling.    Please followup in 6 months

## 2019-11-24 NOTE — Progress Notes (Signed)
Subjective:    Patient ID: [name], female    DOB: [birth year], 64 y.o.   MRN: [id number]  HPI The patient is here for follow up of their chronic medical problems, including hypothyroidism, LE edema due to venous insufficiency, h/o PE on warfarin, B12 def, COPD.  She is taking all of her medications as prescribed.    She is exercising regularly.   She has a floor peddler.     She would like her back looked at for screening for skin cancer.    She has a black spot in the back of her tongue that she would like looked at. It does not hurt.  It has been there for 2-3 weeks.  She denies trauma to the area.   Her leg swelling is bad - it feels like her legs are going to burst open sometimes. There has been some leakage from her right leg - it is clear fluid.    Individuals in her apartment building are smoking meth or heroine  - she can smell it in her apartment. It is causing her not to be able to sleep and causing her stress.     Medications and allergies reviewed with patient and updated if appropriate.  Patient Active Problem List   Diagnosis Date Noted  . Lesion of tongue 11/25/2019  . Left upper arm pain 10/16/2018  . Mixed incontinence 11/10/2017  . B12 deficiency 10/12/2017  . Microscopic hematuria 10/12/2017  . Bilateral primary osteoarthritis of knee 11/18/2016  . Female bladder prolapse 10/10/2016  . Long term current use of anticoagulant 10/10/2016  . Falls frequently 07/14/2016  . Syncope 07/14/2016  . Hyperlipidemia 06/10/2014  . Severe obesity (BMI >= 40) ([place]) 06/10/2014  . CAD (coronary artery disease)   . Interstitial cystitis 01/31/2013  . Chronic narcotic dependence ([place]) 01/30/2013  . S/P hip replacement 11/01/2012  . Venous insufficiency (chronic) (peripheral)   . Atherosclerosis of native arteries of the extremities with ulceration ([place]) 07/21/2011  . Esophageal spasm 06/14/2011  . Hypothyroidism 01/19/2010  . LEG CRAMPS, NOCTURNAL 01/18/2010    . Bilateral leg edema 01/18/2010  . History of pulmonary embolism 08/10/2007  . COPD with emphysema ([place]) 08/10/2007  . LOW BACK PAIN, CHRONIC 08/10/2007  . MEDIASTINAL ADENOPATHY CASTLEMANS D 08/10/2007    Current Outpatient Medications on File Prior to Visit  Medication Sig Dispense Refill  . bisacodyl (BISACODYL LAXATIVE) 5 MG EC tablet Take 30 mg by mouth daily as needed for moderate constipation.    . calcium carbonate (TUMS - DOSED IN MG ELEMENTAL CALCIUM) 500 MG chewable tablet Chew 1 tablet by mouth as needed for indigestion or heartburn.    Marland Kitchen HYDROmorphone (DILAUDID) 4 MG tablet TAKE 1 TO 2 TABLETS BY MOUTH EVERY 6 HOURS AS NEEDED FOR BREAKTHROUGH PAIN  0  . hyoscyamine (LEVSIN SL) 0.125 MG SL tablet Place 1 tablet (0.125 mg total) under the tongue as directed. Take 1-2 daily as needed. 30 tablet 0  . levothyroxine (SYNTHROID) 200 MCG tablet Take 1 tablet (200 mcg total) by mouth daily before breakfast. 90 tablet 0  . levothyroxine (SYNTHROID) 25 MCG tablet TAKE 1 TAB BY MOUTH DAILY BEFORE BREAKFAST. TAKE IN ADDITION TO 200 MCG TOTALING 225 MCG DAILY **OFFICE VISIT DUE** 90 tablet 0  . methadone (DOLOPHINE) 10 MG tablet Take 10 mg by mouth every 8 (eight) hours as needed. for pain  0  . nystatin (MYCOSTATIN/NYSTOP) powder Apply topically 4 (four) times daily. 60 g 5  .  vitamin B-12 (CYANOCOBALAMIN) 1000 MCG tablet Take 1 tablet (1,000 mcg total) by mouth daily.    Marland Kitchen warfarin (COUMADIN) 5 MG tablet Take 1 tablet daily or as directed by anticoagulation clinic 100 tablet 1   No current facility-administered medications on file prior to visit.    Past Medical History:  Diagnosis Date  . Anemia, unspecified   . Anxiety   . Arthritis    "about q joint i've got" (07/14/2016)  . Benign neoplasm of colon 12/2007   Hyperplastic colon polyps  . CAD (coronary artery disease)    minimal catheterization, October 2008  . Cellulitis   . Chest pain    with stress  . Childhood asthma   .  Chronic low back pain   . Constipation    and diarrhea chronic..Dr. Alben Spittle  . Depression   . Diverticulosis of colon (without mention of hemorrhage)   . DVT (deep venous thrombosis) ([facility]) 1990s   LLE  . Family history of adverse reaction to anesthesia    "daughter died having her 1st child cause she got too much anesthesia"   . GERD (gastroesophageal reflux disease)   . History of blood transfusion 01/2008   "related to hip OR"  . 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 Hypopotassemia   . Hypotension, unspecified   . Leg pain   . 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  . Methadone adverse reaction    for chronic leg and back pain  . Other pulmonary embolism and infarction 2008   Significant 2008 and coumadin therapy RV dysfunction...echo...2008..EF 50%..right ventricle markedly dilated w marked right ventricular dysfunc and moder tricuspid regurg/echo..March 2010, Ef 50%, mild dilation of right ventricule w mild decrease right ventric function   . Peripheral vascular disease ([place])   . Rectus sheath hematoma 07/13/2012   On coumadin   . Right bundle branch block    intermittent  . Thyroid disease   . Tobacco use disorder   . Urinary incontinence   . Venous insufficiency (chronic) (peripheral)    Patient's legs were wrapped 2013    Past Surgical History:  Procedure Laterality Date  . CARDIAC CATHETERIZATION  05/2007   Archie Endo 12/08/2010  . CHOLECYSTECTOMY OPEN    . COLONOSCOPY WITH PROPOFOL N/A 08/11/2017   Procedure: COLONOSCOPY WITH PROPOFOL;  Surgeon: Doran Stabler, MD;  Location: [facility];  Service: Gastroenterology;  Laterality: N/A;  . GASTRIC BYPASS  1980s  . JOINT REPLACEMENT    . KNEE ARTHROSCOPY Left 05/2001   Archie Endo 12/21/2010  . SHOULDER ARTHROSCOPY W/ ROTATOR CUFF REPAIR Right 08/2002   Archie Endo 12/21/2010  . TONSILLECTOMY    . TOTAL HIP ARTHROPLASTY Right 01/2008    Social History    Socioeconomic History  . Marital status: Divorced    Spouse name: Not on file  . Number of children: 1  . Years of education: Not on file  . Highest education level: Not on file  Occupational History  . Occupation: Disabled    Employer: UNEMPLOYED  Tobacco Use  . Smoking status: Former Smoker    Packs/day: 0.10    Years: 27.00    Pack years: 2.70    Types: Cigarettes    Quit date: 06/09/2011    Years since quitting: 8.4  . Smokeless tobacco: Never Used  Substance and Sexual Activity  . Alcohol use: No    Alcohol/week: 0.0 standard drinks    Comment:  07/14/2016 "nothing since the early 1990s"  . Drug use: No  . Sexual activity: Not Currently  Other Topics Concern  . Not on file  Social History Narrative   Current smoker wi last 12 mos.    Social Determinants of Health   Financial Resource Strain:   . Difficulty of Paying Living Expenses:   Food Insecurity:   . Worried About Charity fundraiser in the Last Year:   . Arboriculturist in the Last Year:   Transportation Needs:   . Film/video editor (Medical):   Marland Kitchen Lack of Transportation (Non-Medical):   Physical Activity:   . Days of Exercise per Week:   . Minutes of Exercise per Session:   Stress:   . Feeling of Stress :   Social Connections:   . Frequency of Communication with Friends and Family:   . Frequency of Social Gatherings with Friends and Family:   . Attends Religious Services:   . Active Member of Clubs or Organizations:   . Attends Archivist Meetings:   Marland Kitchen Marital Status:     Family History  Problem Relation Age of Onset  . Heart disease Mother   . Heart disease Father   . Crohn's disease Sister     Review of Systems  Constitutional: Negative for chills and fever.  Respiratory: Positive for shortness of breath. Negative for cough and wheezing.   Cardiovascular: Positive for palpitations (occ) and leg swelling. Negative for chest pain.  Musculoskeletal: Positive for back pain.   Neurological: Positive for headaches.       Objective:   Vitals:   11/25/19 1342  BP: 122/72  Pulse: 67  Resp: 18  Temp: 98.5 F (36.9 C)  SpO2: 93%   BP Readings from Last 3 Encounters:  11/25/19 122/72  08/23/19 116/79  06/24/19 116/68   Wt Readings from Last 3 Encounters:  08/23/19 (!) 331 lb 3.2 oz (150.2 kg)  06/24/19 (!) 323 lb 3.2 oz (146.6 kg)  05/30/19 (!) 320 lb (145.2 kg)   Body mass index is 55.11 kg/m.   Physical Exam    Constitutional: Appears well-developed and well-nourished. No distress.  HENT:  Head: Normocephalic and atraumatic.  Neck: Neck supple. No tracheal deviation present. No thyromegaly present.  No cervical lymphadenopathy Cardiovascular: Normal rate, regular rhythm and normal heart sounds.   No murmur heard. No carotid bruit .  B/l LE edema -legs are very tight, a couple of areas with leakage in right lower leg - clear fluid.  No concerning erythema Pulmonary/Chest: Effort normal and breath sounds normal. No respiratory distress. No has no wheezes. No rales.  Skin: Skin is warm and dry. Not diaphoretic.  Psychiatric: Normal mood and affect. Behavior is normal.      Assessment & Plan:    See Problem List for Assessment and Plan of chronic medical problems.    This visit occurred during the SARS-CoV-2 public health emergency.  Safety protocols were in place, including screening questions prior to the visit, additional usage of staff PPE, and extensive cleaning of exam room while observing appropriate contact time as indicated for disinfecting solutions.

## 2019-11-25 ENCOUNTER — Encounter: Payer: Self-pay | Admitting: Internal Medicine

## 2019-11-25 ENCOUNTER — Other Ambulatory Visit: Payer: Self-pay

## 2019-11-25 ENCOUNTER — Ambulatory Visit (INDEPENDENT_AMBULATORY_CARE_PROVIDER_SITE_OTHER): Payer: Medicare HMO | Admitting: Internal Medicine

## 2019-11-25 VITALS — BP 122/72 | HR 67 | Temp 98.5°F | Resp 18 | Ht 65.0 in

## 2019-11-25 DIAGNOSIS — K148 Other diseases of tongue: Secondary | ICD-10-CM

## 2019-11-25 DIAGNOSIS — E039 Hypothyroidism, unspecified: Secondary | ICD-10-CM

## 2019-11-25 DIAGNOSIS — R600 Localized edema: Secondary | ICD-10-CM | POA: Diagnosis not present

## 2019-11-25 DIAGNOSIS — E538 Deficiency of other specified B group vitamins: Secondary | ICD-10-CM

## 2019-11-25 DIAGNOSIS — Z86711 Personal history of pulmonary embolism: Secondary | ICD-10-CM | POA: Diagnosis not present

## 2019-11-25 DIAGNOSIS — Z6841 Body Mass Index (BMI) 40.0 and over, adult: Secondary | ICD-10-CM | POA: Diagnosis not present

## 2019-11-25 LAB — CBC WITH DIFFERENTIAL/PLATELET
Basophils Absolute: 0 10*3/uL (ref 0.0–0.1)
Basophils Relative: 0.6 % (ref 0.0–3.0)
Eosinophils Absolute: 0.1 10*3/uL (ref 0.0–0.7)
Eosinophils Relative: 1.8 % (ref 0.0–5.0)
HCT: 38.4 % (ref 36.0–46.0)
Hemoglobin: 12.6 g/dL (ref 12.0–15.0)
Lymphocytes Relative: 18.7 % (ref 12.0–46.0)
Lymphs Abs: 0.8 10*3/uL (ref 0.7–4.0)
MCHC: 32.7 g/dL (ref 30.0–36.0)
MCV: 88.8 fl (ref 78.0–100.0)
Monocytes Absolute: 0.4 10*3/uL (ref 0.1–1.0)
Monocytes Relative: 7.9 % (ref 3.0–12.0)
Neutro Abs: 3.2 10*3/uL (ref 1.4–7.7)
Neutrophils Relative %: 71 % (ref 43.0–77.0)
Platelets: 170 10*3/uL (ref 150.0–400.0)
RBC: 4.33 Mil/uL (ref 3.87–5.11)
RDW: 15.2 % (ref 11.5–15.5)
WBC: 4.5 10*3/uL (ref 4.0–10.5)

## 2019-11-25 LAB — COMPREHENSIVE METABOLIC PANEL
ALT: 11 U/L (ref 0–35)
AST: 19 U/L (ref 0–37)
Albumin: 3.7 g/dL (ref 3.5–5.2)
Alkaline Phosphatase: 71 U/L (ref 39–117)
BUN: 7 mg/dL (ref 6–23)
CO2: 34 mEq/L — ABNORMAL HIGH (ref 19–32)
Calcium: 9 mg/dL (ref 8.4–10.5)
Chloride: 100 mEq/L (ref 96–112)
Creatinine, Ser: 0.7 mg/dL (ref 0.40–1.20)
GFR: 83.74 mL/min (ref >60.00)
Glucose, Bld: 91 mg/dL (ref 70–99)
Potassium: 3.8 mEq/L (ref 3.5–5.1)
Sodium: 136 mEq/L (ref 135–145)
Total Bilirubin: 0.6 mg/dL (ref 0.2–1.2)
Total Protein: 7.4 g/dL (ref 6.0–8.3)

## 2019-11-25 LAB — TSH: TSH: 3.33 u[IU]/mL (ref 0.35–4.50)

## 2019-11-25 NOTE — Assessment & Plan Note (Signed)
Chronic On lifelong a/c, managed by Jenny Reichmann in our coumadin clinic Discussed xarelto/eliquis, but she prefers to stay on warfarin - she has no issues with it and checks her INR at home Continue warfarin - dosing per coumadin clinic

## 2019-11-25 NOTE — Assessment & Plan Note (Signed)
Chronic Secondary to chronic venous insuff and h/o DVT Has leakage of clear fluid from right leg Stressed elevating legs, low salt diet, continue regular exercise, weight loss Sees vascular prn  Does not want to try lasix and BP may not support it Advised following up with vascular - may need UNNA boot again cmp

## 2019-11-25 NOTE — Assessment & Plan Note (Signed)
Acute Possible blood blister - but she denies trauma Will refer to ENT

## 2019-11-25 NOTE — Assessment & Plan Note (Signed)
Chronic  Clinically euthyroid Check tsh  Titrate med dose if needed  

## 2019-11-26 ENCOUNTER — Encounter: Payer: Self-pay | Admitting: Internal Medicine

## 2019-11-26 ENCOUNTER — Ambulatory Visit: Payer: Self-pay | Admitting: General Practice

## 2019-11-26 ENCOUNTER — Ambulatory Visit (INDEPENDENT_AMBULATORY_CARE_PROVIDER_SITE_OTHER): Payer: Medicare HMO | Admitting: General Practice

## 2019-11-26 DIAGNOSIS — Z7901 Long term (current) use of anticoagulants: Secondary | ICD-10-CM | POA: Diagnosis not present

## 2019-11-26 DIAGNOSIS — Z86711 Personal history of pulmonary embolism: Secondary | ICD-10-CM | POA: Diagnosis not present

## 2019-11-26 LAB — POCT INR: INR: 2.1 (ref 2.0–3.0)

## 2019-11-26 NOTE — Patient Instructions (Signed)
Pre visit review using our clinic review tool, if applicable. No additional management support is needed unless otherwise documented below in the visit note.  Continue to take 1 tablet daily.  Re-check in 2 weeks.  Patient is using [employer] home monitoring.  Dosing instructions given to patient and she did verbalize understanding.

## 2019-11-26 NOTE — Progress Notes (Signed)
Agree with management.  Carmen Dupuis J Elber Galyean, MD  

## 2019-11-26 NOTE — Patient Instructions (Signed)
Pre visit review using our clinic review tool, if applicable. No additional management support is needed unless otherwise documented below in the visit note. 

## 2019-12-05 ENCOUNTER — Ambulatory Visit (INDEPENDENT_AMBULATORY_CARE_PROVIDER_SITE_OTHER): Payer: Medicare HMO | Admitting: General Practice

## 2019-12-05 DIAGNOSIS — Z7901 Long term (current) use of anticoagulants: Secondary | ICD-10-CM

## 2019-12-05 DIAGNOSIS — Z86711 Personal history of pulmonary embolism: Secondary | ICD-10-CM | POA: Diagnosis not present

## 2019-12-05 LAB — POCT INR: INR: 2.1 (ref 2.0–3.0)

## 2019-12-05 NOTE — Patient Instructions (Signed)
Pre visit review using our clinic review tool, if applicable. No additional management support is needed unless otherwise documented below in the visit note.  Continue to take 1 tablet daily.  Re-check in 2 weeks.  Patient is using [employer] home monitoring.  Dosing instructions given to patient and she did verbalize understanding.

## 2019-12-06 NOTE — Progress Notes (Signed)
Agree with management.  [name] J Carmen Chern, MD  

## 2019-12-10 DIAGNOSIS — Z7901 Long term (current) use of anticoagulants: Secondary | ICD-10-CM | POA: Diagnosis not present

## 2019-12-10 DIAGNOSIS — Z86718 Personal history of other venous thrombosis and embolism: Secondary | ICD-10-CM | POA: Diagnosis not present

## 2019-12-13 ENCOUNTER — Encounter: Payer: Self-pay | Admitting: Internal Medicine

## 2019-12-17 ENCOUNTER — Ambulatory Visit (INDEPENDENT_AMBULATORY_CARE_PROVIDER_SITE_OTHER): Payer: Medicare HMO | Admitting: General Practice

## 2019-12-17 DIAGNOSIS — Z7901 Long term (current) use of anticoagulants: Secondary | ICD-10-CM

## 2019-12-17 DIAGNOSIS — Z86711 Personal history of pulmonary embolism: Secondary | ICD-10-CM

## 2019-12-17 LAB — POCT INR: INR: 2 (ref 2.0–3.0)

## 2019-12-17 NOTE — Patient Instructions (Addendum)
Pre visit review using our clinic review tool, if applicable. No additional management support is needed unless otherwise documented below in the visit note.  Continue to take 1 tablet daily.  Re-check in 2 weeks.  Patient is using [employer] home monitoring.  Dosing instructions given to patient and she did verbalize understanding.

## 2019-12-19 NOTE — Progress Notes (Signed)
Agree with management.  [name] J Carmen Matherly, MD  

## 2019-12-25 ENCOUNTER — Ambulatory Visit (INDEPENDENT_AMBULATORY_CARE_PROVIDER_SITE_OTHER): Payer: Medicare HMO | Admitting: General Practice

## 2019-12-25 DIAGNOSIS — Z86711 Personal history of pulmonary embolism: Secondary | ICD-10-CM

## 2019-12-25 DIAGNOSIS — Z7901 Long term (current) use of anticoagulants: Secondary | ICD-10-CM

## 2019-12-25 LAB — POCT INR: INR: 1.8 — AB (ref 2.0–3.0)

## 2019-12-25 NOTE — Patient Instructions (Signed)
Pre visit review using our clinic review tool, if applicable. No additional management support is needed unless otherwise documented below in the visit note.  Take 1 1/2 tablets today and then continue to take 1 tablet daily.  Re-check in 2 weeks.  Patient is using [employer] home monitoring.  Dosing instructions given to patient and she did verbalize understanding.

## 2019-12-25 NOTE — Progress Notes (Signed)
I have reviewed and agree with note, evaluation, plan.   Almee Pelphrey, MD  

## 2020-01-01 ENCOUNTER — Other Ambulatory Visit: Payer: Self-pay | Admitting: Internal Medicine

## 2020-01-05 ENCOUNTER — Encounter: Payer: Self-pay | Admitting: Internal Medicine

## 2020-01-07 ENCOUNTER — Ambulatory Visit (INDEPENDENT_AMBULATORY_CARE_PROVIDER_SITE_OTHER): Payer: Medicare HMO | Admitting: General Practice

## 2020-01-07 DIAGNOSIS — Z86711 Personal history of pulmonary embolism: Secondary | ICD-10-CM

## 2020-01-07 DIAGNOSIS — Z7901 Long term (current) use of anticoagulants: Secondary | ICD-10-CM

## 2020-01-07 LAB — POCT INR: INR: 1.3 — AB (ref 2.0–3.0)

## 2020-01-07 NOTE — Patient Instructions (Signed)
Pre visit review using our clinic review tool, if applicable. No additional management support is needed unless otherwise documented below in the visit note.  Take 1 1/2 tablets today, tomorrow and Thursday and then continue to take 1 tablet daily.  Re-check on Monday. Patient is using [employer] home monitoring.  Dosing instructions left on patient's VM.

## 2020-01-07 NOTE — Progress Notes (Signed)
Agree with management.  Gregoire Bennis J Laquon Emel, MD  

## 2020-01-14 ENCOUNTER — Ambulatory Visit (INDEPENDENT_AMBULATORY_CARE_PROVIDER_SITE_OTHER): Payer: Medicare HMO | Admitting: General Practice

## 2020-01-14 DIAGNOSIS — Z7901 Long term (current) use of anticoagulants: Secondary | ICD-10-CM

## 2020-01-14 DIAGNOSIS — Z86711 Personal history of pulmonary embolism: Secondary | ICD-10-CM

## 2020-01-14 LAB — POCT INR: INR: 1.8 — AB (ref 2.0–3.0)

## 2020-01-14 NOTE — Patient Instructions (Signed)
Pre visit review using our clinic review tool, if applicable. No additional management support is needed unless otherwise documented below in the visit note.  Take 1 1/2 tablets today and then change dosage and take 1 tablet daily except take 1 1/2 tablets every Wednesday.    Re-check in 2 weeks.  Patient is using [employer] home monitoring.  Dosing instructions left on patient's VM.

## 2020-01-14 NOTE — Progress Notes (Signed)
Agree with management.  [name] [name], MD  

## 2020-01-27 ENCOUNTER — Ambulatory Visit (INDEPENDENT_AMBULATORY_CARE_PROVIDER_SITE_OTHER): Payer: Medicare HMO | Admitting: General Practice

## 2020-01-27 DIAGNOSIS — Z86711 Personal history of pulmonary embolism: Secondary | ICD-10-CM | POA: Diagnosis not present

## 2020-01-27 DIAGNOSIS — Z7901 Long term (current) use of anticoagulants: Secondary | ICD-10-CM

## 2020-01-27 DIAGNOSIS — Z86718 Personal history of other venous thrombosis and embolism: Secondary | ICD-10-CM | POA: Diagnosis not present

## 2020-01-27 LAB — POCT INR: INR: 1.4 — AB (ref 2.0–3.0)

## 2020-01-27 NOTE — Patient Instructions (Signed)
Pre visit review using our clinic review tool, if applicable. No additional management support is needed unless otherwise documented below in the visit note.  Take 1 1/2 tablets through Thursday.  On Friday continue to  take 1 tablet daily except take 1 1/2 tablets every Wednesday.    Re-check in 1 week.   Patient is using [employer] home monitoring.  Instructed patient to limit spinach to 1/2 cup 2 or 3 times weekly.  Patient verbalized understanding.

## 2020-02-03 ENCOUNTER — Ambulatory Visit (INDEPENDENT_AMBULATORY_CARE_PROVIDER_SITE_OTHER): Payer: Medicare HMO | Admitting: General Practice

## 2020-02-03 DIAGNOSIS — Z86711 Personal history of pulmonary embolism: Secondary | ICD-10-CM

## 2020-02-03 DIAGNOSIS — Z7901 Long term (current) use of anticoagulants: Secondary | ICD-10-CM

## 2020-02-03 LAB — POCT INR: INR: 1.4 — AB (ref 2.0–3.0)

## 2020-02-03 NOTE — Patient Instructions (Signed)
Pre visit review using our clinic review tool, if applicable. No additional management support is needed unless otherwise documented below in the visit note.  Take 2 tablets (10 mg) today and tomorrow and check INR on Wednesday.  Patient is using [employer] home monitoring.  Instructed patient to stop eating dark green leafy greens until INR is therapeutic.  Patient verbalized understanding.

## 2020-02-05 ENCOUNTER — Ambulatory Visit (INDEPENDENT_AMBULATORY_CARE_PROVIDER_SITE_OTHER): Payer: Medicare HMO | Admitting: General Practice

## 2020-02-05 DIAGNOSIS — Z86711 Personal history of pulmonary embolism: Secondary | ICD-10-CM

## 2020-02-05 DIAGNOSIS — Z7901 Long term (current) use of anticoagulants: Secondary | ICD-10-CM

## 2020-02-05 LAB — POCT INR: INR: 1.7 — AB (ref 2.0–3.0)

## 2020-02-05 NOTE — Progress Notes (Signed)
I have reviewed the results and agree with this plan   

## 2020-02-05 NOTE — Patient Instructions (Signed)
Pre visit review using our clinic review tool, if applicable. No additional management support is needed unless otherwise documented below in the visit note.  Take 10 mg today, 5 mg on Thursday, 7.5 mg on Friday, 5 mg Saturday and Sunday, 7.5 mg on Monday and 5 mg on Tuesday.  Re-check in 1 week.  Patient is using [employer] home monitoring.  Instructed patient to stop eating dark green leafy greens until INR is therapeutic.  Patient verbalized understanding.

## 2020-02-13 ENCOUNTER — Ambulatory Visit (INDEPENDENT_AMBULATORY_CARE_PROVIDER_SITE_OTHER): Payer: Medicare HMO | Admitting: General Practice

## 2020-02-13 DIAGNOSIS — Z86711 Personal history of pulmonary embolism: Secondary | ICD-10-CM

## 2020-02-13 DIAGNOSIS — Z7901 Long term (current) use of anticoagulants: Secondary | ICD-10-CM | POA: Diagnosis not present

## 2020-02-13 LAB — POCT INR: INR: 2 (ref 2.0–3.0)

## 2020-02-13 NOTE — Progress Notes (Signed)
Agree with management.  Krystiana Fornes J Raysean Graumann, MD  

## 2020-02-13 NOTE — Patient Instructions (Signed)
Pre visit review using our clinic review tool, if applicable. No additional management support is needed unless otherwise documented below in the visit note.  Take 1 tablet daily except 1 1/2 tablets on Mon Wed and Fridays.  Re-check in 1 week.  Patient is using [employer] home monitoring.  Instructed patient to stop eating dark green leafy greens until INR is therapeutic.  Patient verbalized understanding.

## 2020-02-25 ENCOUNTER — Ambulatory Visit (INDEPENDENT_AMBULATORY_CARE_PROVIDER_SITE_OTHER): Payer: Medicare HMO | Admitting: General Practice

## 2020-02-25 DIAGNOSIS — Z86711 Personal history of pulmonary embolism: Secondary | ICD-10-CM

## 2020-02-25 DIAGNOSIS — Z7901 Long term (current) use of anticoagulants: Secondary | ICD-10-CM | POA: Diagnosis not present

## 2020-02-25 LAB — POCT INR: INR: 2.3 (ref 2.0–3.0)

## 2020-02-25 NOTE — Patient Instructions (Addendum)
Pre visit review using our clinic review tool, if applicable. No additional management support is needed unless otherwise documented below in the visit note.  Take 1 tablet daily except 1 1/2 tablets on Mon Wed and Fridays.  Re-check in 1 week.  Patient is using [employer] home monitoring.  Instructed patient to stop eating dark green leafy greens until INR is therapeutic.  Patient verbalized understanding.

## 2020-02-25 NOTE — Progress Notes (Signed)
Agree with management.  Olanrewaju Osborn J Mallorey Odonell, MD  

## 2020-03-05 ENCOUNTER — Ambulatory Visit (INDEPENDENT_AMBULATORY_CARE_PROVIDER_SITE_OTHER): Payer: Medicare HMO | Admitting: General Practice

## 2020-03-05 DIAGNOSIS — Z7901 Long term (current) use of anticoagulants: Secondary | ICD-10-CM | POA: Diagnosis not present

## 2020-03-05 DIAGNOSIS — Z86718 Personal history of other venous thrombosis and embolism: Secondary | ICD-10-CM | POA: Diagnosis not present

## 2020-03-05 DIAGNOSIS — Z86711 Personal history of pulmonary embolism: Secondary | ICD-10-CM | POA: Diagnosis not present

## 2020-03-05 LAB — POCT INR: INR: 2.6 (ref 2.0–3.0)

## 2020-03-05 NOTE — Patient Instructions (Signed)
Pre visit review using our clinic review tool, if applicable. No additional management support is needed unless otherwise documented below in the visit note.  Take 1 tablet daily except 1 1/2 tablets on Mon Wed and Fridays.  Re-check in 1 week.  Patient is using [employer] home monitoring.  Instructed patient to stop eating dark green leafy greens until INR is therapeutic.  Patient verbalized understanding.

## 2020-03-05 NOTE — Progress Notes (Signed)
Agree with management.  Ikram Riebe J Railey Glad, MD  

## 2020-03-09 ENCOUNTER — Encounter: Payer: Self-pay | Admitting: Internal Medicine

## 2020-03-09 DIAGNOSIS — E6609 Other obesity due to excess calories: Secondary | ICD-10-CM

## 2020-03-18 NOTE — Telephone Encounter (Signed)
Notified pt MD is out of the office will respond once she returns.Marland KitchenJohny [name]

## 2020-03-23 ENCOUNTER — Ambulatory Visit (INDEPENDENT_AMBULATORY_CARE_PROVIDER_SITE_OTHER): Payer: Medicare HMO | Admitting: General Practice

## 2020-03-23 DIAGNOSIS — Z86711 Personal history of pulmonary embolism: Secondary | ICD-10-CM

## 2020-03-23 DIAGNOSIS — Z7901 Long term (current) use of anticoagulants: Secondary | ICD-10-CM

## 2020-03-23 LAB — POCT INR: INR: 1.7 — AB (ref 2.0–3.0)

## 2020-03-23 NOTE — Patient Instructions (Addendum)
Pre visit review using our clinic review tool, if applicable. No additional management support is needed unless otherwise documented below in the visit note.  Take 2 tablets today and then continue to take 1 tablet daily except 1 1/2 tablets on Mon Wed and Fridays.  Re-check in 1 week.  Patient is using [employer] home monitoring.  Instructed patient to stop eating dark green leafy greens until INR is therapeutic.  Patient verbalized understanding.

## 2020-03-27 ENCOUNTER — Other Ambulatory Visit: Payer: Self-pay | Admitting: Internal Medicine

## 2020-03-30 ENCOUNTER — Ambulatory Visit (INDEPENDENT_AMBULATORY_CARE_PROVIDER_SITE_OTHER): Payer: Medicare HMO | Admitting: General Practice

## 2020-03-30 DIAGNOSIS — Z7901 Long term (current) use of anticoagulants: Secondary | ICD-10-CM

## 2020-03-30 DIAGNOSIS — Z86711 Personal history of pulmonary embolism: Secondary | ICD-10-CM

## 2020-03-30 LAB — POCT INR: INR: 1.8 — AB (ref 2.0–3.0)

## 2020-03-30 NOTE — Patient Instructions (Signed)
Pre visit review using our clinic review tool, if applicable. No additional management support is needed unless otherwise documented below in the visit note.  Take 2 tablets today and then change dosage and take 1 tablet daily except 1 1/2 tablets on Mon Wed and Fridays and Saturdays. Re-check in 1 week.  Patient is using [employer] home monitoring. Patient verbalized understanding.

## 2020-03-31 ENCOUNTER — Encounter: Payer: Self-pay | Admitting: Internal Medicine

## 2020-04-07 ENCOUNTER — Other Ambulatory Visit: Payer: Self-pay | Admitting: General Practice

## 2020-04-07 ENCOUNTER — Ambulatory Visit (INDEPENDENT_AMBULATORY_CARE_PROVIDER_SITE_OTHER): Payer: Medicare HMO | Admitting: General Practice

## 2020-04-07 DIAGNOSIS — Z86711 Personal history of pulmonary embolism: Secondary | ICD-10-CM | POA: Diagnosis not present

## 2020-04-07 DIAGNOSIS — Z7901 Long term (current) use of anticoagulants: Secondary | ICD-10-CM | POA: Diagnosis not present

## 2020-04-07 LAB — POCT INR: INR: 1.7 — AB (ref 2.0–3.0)

## 2020-04-07 MED ORDER — WARFARIN SODIUM 5 MG PO TABS: ORAL_TABLET | ORAL | 1 refills | Status: DC

## 2020-04-07 NOTE — Patient Instructions (Signed)
Pre visit review using our clinic review tool, if applicable. No additional management support is needed unless otherwise documented below in the visit note.  Change dosage and take 1 1/2 tablets every day except take 1 on Sundays.  Re-check in 1 week.  Patient is using [employer] home monitoring. Patient verbalized understanding.

## 2020-04-08 NOTE — Progress Notes (Signed)
Agree with management.  Matthe Sloane J Adylene Dlugosz, MD  

## 2020-04-16 ENCOUNTER — Ambulatory Visit (INDEPENDENT_AMBULATORY_CARE_PROVIDER_SITE_OTHER): Payer: Medicare HMO | Admitting: General Practice

## 2020-04-16 DIAGNOSIS — Z86718 Personal history of other venous thrombosis and embolism: Secondary | ICD-10-CM | POA: Diagnosis not present

## 2020-04-16 DIAGNOSIS — Z7901 Long term (current) use of anticoagulants: Secondary | ICD-10-CM | POA: Diagnosis not present

## 2020-04-16 DIAGNOSIS — Z86711 Personal history of pulmonary embolism: Secondary | ICD-10-CM

## 2020-04-16 LAB — POCT INR: INR: 2 (ref 2.0–3.0)

## 2020-04-16 NOTE — Progress Notes (Signed)
Agree with management.  Elleana Stillson J Adalei Novell, MD  

## 2020-04-16 NOTE — Patient Instructions (Signed)
Pre visit review using our clinic review tool, if applicable. No additional management support is needed unless otherwise documented below in the visit note. Continue to take 1 1/2 tablets every day except take 1 on Sundays.  Re-check in 1 week.  Patient is using [employer] home monitoring. Patient verbalized understanding.

## 2020-04-21 ENCOUNTER — Ambulatory Visit (INDEPENDENT_AMBULATORY_CARE_PROVIDER_SITE_OTHER): Payer: Medicare HMO | Admitting: General Practice

## 2020-04-21 DIAGNOSIS — Z86711 Personal history of pulmonary embolism: Secondary | ICD-10-CM

## 2020-04-21 DIAGNOSIS — Z7901 Long term (current) use of anticoagulants: Secondary | ICD-10-CM | POA: Diagnosis not present

## 2020-04-21 LAB — POCT INR: INR: 2.2 (ref 2.0–3.0)

## 2020-04-21 NOTE — Patient Instructions (Signed)
Pre visit review using our clinic review tool, if applicable. No additional management support is needed unless otherwise documented below in the visit note.  Continue to take 1 1/2 tablets every day except take 1 on Sundays.  Re-check in 1 week.  Patient is using [employer] home monitoring. Patient verbalized understanding.

## 2020-04-22 NOTE — Progress Notes (Signed)
Agree with management.  [name] J Driana Dazey, MD  

## 2020-05-04 ENCOUNTER — Ambulatory Visit (INDEPENDENT_AMBULATORY_CARE_PROVIDER_SITE_OTHER): Payer: Medicare HMO | Admitting: General Practice

## 2020-05-04 DIAGNOSIS — Z7901 Long term (current) use of anticoagulants: Secondary | ICD-10-CM | POA: Diagnosis not present

## 2020-05-04 LAB — POCT INR: INR: 2.1 (ref 2.0–3.0)

## 2020-05-04 NOTE — Patient Instructions (Signed)
Pre visit review using our clinic review tool, if applicable. No additional management support is needed unless otherwise documented below in the visit note.  Continue to take 1 1/2 tablets every day except take 1 on Sundays.  Re-check in 2 weeks.  Patient is using [employer] home monitoring. Patient verbalized understanding.

## 2020-05-06 ENCOUNTER — Encounter: Payer: Self-pay | Admitting: Internal Medicine

## 2020-05-19 ENCOUNTER — Ambulatory Visit (INDEPENDENT_AMBULATORY_CARE_PROVIDER_SITE_OTHER): Payer: Medicare HMO | Admitting: General Practice

## 2020-05-19 ENCOUNTER — Encounter: Payer: Self-pay | Admitting: Internal Medicine

## 2020-05-19 DIAGNOSIS — M545 Low back pain, unspecified: Secondary | ICD-10-CM

## 2020-05-19 DIAGNOSIS — Z7901 Long term (current) use of anticoagulants: Secondary | ICD-10-CM

## 2020-05-19 DIAGNOSIS — Z86711 Personal history of pulmonary embolism: Secondary | ICD-10-CM

## 2020-05-19 DIAGNOSIS — R296 Repeated falls: Secondary | ICD-10-CM

## 2020-05-19 DIAGNOSIS — M170 Bilateral primary osteoarthritis of knee: Secondary | ICD-10-CM

## 2020-05-19 DIAGNOSIS — G8929 Other chronic pain: Secondary | ICD-10-CM

## 2020-05-19 LAB — POCT INR: INR: 2.4 (ref 2.0–3.0)

## 2020-05-19 NOTE — Patient Instructions (Signed)
Pre visit review using our clinic review tool, if applicable. No additional management support is needed unless otherwise documented below in the visit note.  Continue to take 1 1/2 tablets every day except take 1 on Sundays.  Re-check in 2 weeks.  Patient is using [employer] home monitoring. Patient verbalized understanding.

## 2020-05-19 NOTE — Progress Notes (Signed)
Agree with management.  Tallin Hart J Dashan Chizmar, MD  

## 2020-05-25 NOTE — Patient Instructions (Addendum)
  Blood work was ordered.     Flu immunization administered today.     Medications reviewed and updated.  Changes include :   Ciclopirox cream for your rash.  Use the powder for prevention.   Your prescription(s) have been submitted to your pharmacy. Please take as directed and contact our office if you believe you are having problem(s) with the medication(s).  A referral was ordered for urogynecology and podiatry.       Someone from their office will call you to schedule an appointment.   A standing walker was ordered.     Please followup in 6 months

## 2020-05-25 NOTE — Progress Notes (Signed)
Subjective:    Patient ID: [name], female    DOB: Dec 08, 1955, 64 y.o.   MRN: [id number]  HPI The patient is here for follow up of their chronic medical problems, including hypothyroidism, LE edema due to venous insufficiency, h/o PE on warfarin, B12 def, COPD  She has oozing rash under her breasts.  The powder helps minimally.  It itches and hurts at times.    Severe b/l knee OA:  Her knees are preventing her from walking - they are getting worse.    Chronic back pain - following with pain management.   Gets around in house uses cane.  Can only go to stores with scooters.  She can not use a typical walker because she has to bend over too much, which increases her back pain.  She would benefit from a stand up walker.  She is concerned she will need a wheelchair soon.      Medications and allergies reviewed with patient and updated if appropriate.  Patient Active Problem List   Diagnosis Date Noted   Tinea corporis 05/26/2020   Lesion of tongue 11/25/2019   Left upper arm pain 10/16/2018   Mixed incontinence 11/10/2017   B12 deficiency 10/12/2017   Microscopic hematuria 10/12/2017   Bilateral primary osteoarthritis of knee 11/18/2016   Female bladder prolapse 10/10/2016   Long term current use of anticoagulant 10/10/2016   Falls frequently 07/14/2016   Syncope 07/14/2016   Hyperlipidemia 06/10/2014   Severe obesity (BMI >= 40) ([place]) 06/10/2014   CAD (coronary artery disease)    Interstitial cystitis 01/31/2013   Chronic narcotic dependence ([place]) 01/30/2013   S/P hip replacement 11/01/2012   Venous insufficiency (chronic) (peripheral)    Atherosclerosis of native arteries of the extremities with ulceration ([place]) 07/21/2011   Esophageal spasm 06/14/2011   Hypothyroidism 01/19/2010   LEG CRAMPS, NOCTURNAL 01/18/2010   Bilateral leg edema 01/18/2010   History of pulmonary embolism 08/10/2007   COPD with emphysema ([place]) 08/10/2007   LOW  BACK PAIN, CHRONIC 08/10/2007   MEDIASTINAL ADENOPATHY CASTLEMANS D 08/10/2007    Current Outpatient Medications on File Prior to Visit  Medication Sig Dispense Refill   bisacodyl (BISACODYL LAXATIVE) 5 MG EC tablet Take 30 mg by mouth daily as needed for moderate constipation.     calcium carbonate (TUMS - DOSED IN MG ELEMENTAL CALCIUM) 500 MG chewable tablet Chew 1 tablet by mouth as needed for indigestion or heartburn.     HYDROmorphone (DILAUDID) 4 MG tablet TAKE 1 TO 2 TABLETS BY MOUTH EVERY 6 HOURS AS NEEDED FOR BREAKTHROUGH PAIN  0   hyoscyamine (LEVSIN SL) 0.125 MG SL tablet Place 1 tablet (0.125 mg total) under the tongue as directed. Take 1-2 daily as needed. 30 tablet 0   levothyroxine (SYNTHROID) 200 MCG tablet TAKE 1 TABLET (200 MCG TOTAL) BY MOUTH DAILY BEFORE BREAKFAST. 90 tablet 0   levothyroxine (SYNTHROID) 25 MCG tablet TAKE 1 TAB BY MOUTH DAILY BEFORE BREAKFAST. TAKE IN ADDITION TO 200 MCG TOTALING 225 MCG DAILY 90 tablet 0   methadone (DOLOPHINE) 10 MG tablet Take 10 mg by mouth every 8 (eight) hours as needed. for pain  0   nystatin (MYCOSTATIN/NYSTOP) powder Apply topically 4 (four) times daily. 60 g 5   Probiotic Product (PROBIOTIC DAILY PO) Take by mouth.     vitamin B-12 (CYANOCOBALAMIN) 1000 MCG tablet Take 1 tablet (1,000 mcg total) by mouth daily.     warfarin (COUMADIN) 5 MG tablet Take 1 1/2  tablets daily except 1 tablet on Sundays or Take as directed by anticoagulation clinic 145 tablet 1   No current facility-administered medications on file prior to visit.    Past Medical History:  Diagnosis Date   Anemia, unspecified    Anxiety    Arthritis    "about q joint i've got" (07/14/2016)   Benign neoplasm of colon 12/2007   Hyperplastic colon polyps   CAD (coronary artery disease)    minimal catheterization, October 2008   Cellulitis    Chest pain    with stress   Childhood asthma    Chronic low back pain    Constipation    and  diarrhea chronic..Dr. 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   Methadone adverse reaction    for chronic leg and back pain   Other pulmonary embolism and infarction 2008   Significant 2008 and coumadin therapy RV dysfunction...echo...2008..EF 50%..right ventricle markedly dilated w marked right ventricular dysfunc and moder tricuspid regurg/echo..March 2010, Ef 50%, mild dilation of right ventricule w mild decrease right ventric function    Peripheral vascular disease ([facility])    Rectus sheath hematoma 07/13/2012   On coumadin    Right bundle branch block    intermittent   Thyroid disease    Tobacco use disorder    Urinary incontinence    Venous insufficiency (chronic) (peripheral)    Patient's legs were wrapped 2013    Past Surgical History:  Procedure Laterality Date   CARDIAC CATHETERIZATION  05/2007   Archie Endo 12/08/2010   CHOLECYSTECTOMY OPEN     COLONOSCOPY WITH PROPOFOL N/A 08/11/2017   Procedure: COLONOSCOPY WITH PROPOFOL;  Surgeon: Doran Stabler, MD;  Location: [facility];  Service: Gastroenterology;  Laterality: N/A;   GASTRIC BYPASS  1980s   JOINT REPLACEMENT     KNEE ARTHROSCOPY Left 05/2001   Archie Endo 12/21/2010   SHOULDER ARTHROSCOPY W/ ROTATOR CUFF REPAIR Right 08/2002   Archie Endo 12/21/2010   TONSILLECTOMY     TOTAL HIP ARTHROPLASTY Right 01/2008    Social History   Socioeconomic History   Marital status: Divorced     Spouse name: Not on file   Number of children: 1   Years of education: Not on file   Highest education level: Not on file  Occupational History   Occupation: Disabled    Employer: UNEMPLOYED  Tobacco Use   Smoking status: Former Smoker    Packs/day: 0.10    Years: 27.00    Pack years: 2.70    Types: Cigarettes    Quit date: 06/09/2011    Years since quitting: 8.9   Smokeless tobacco: Never Used  Vaping Use   Vaping Use: Never used  Substance and Sexual Activity   Alcohol use: No    Alcohol/week: 0.0 standard drinks    Comment: 07/14/2016 "nothing since the early 1990s"   Drug use: No  Sexual activity: Not Currently  Other Topics Concern   Not on file  Social History Narrative   Current smoker wi last 12 mos.    Social Determinants of Health   Financial Resource Strain:    Difficulty of Paying Living Expenses: Not on file  Food Insecurity:    Worried About Charity fundraiser in the Last Year: Not on file   [employer] of Food in the Last Year: Not on file  Transportation Needs:    Lack of Transportation (Medical): Not on file   Lack of Transportation (Non-Medical): Not on file  Physical Activity:    Days of Exercise per Week: Not on file   Minutes of Exercise per Session: Not on file  Stress:    Feeling of Stress : Not on file  Social Connections:    Frequency of Communication with Friends and Family: Not on file   Frequency of Social Gatherings with Friends and Family: Not on file   Attends Religious Services: Not on file   Active Member of Clubs or Organizations: Not on file   Attends Archivist Meetings: Not on file   Marital Status: Not on file    Family History  Problem Relation Age of Onset   Heart disease Mother    Heart disease Father    Crohn's disease Sister     Review of Systems  Constitutional: Negative for chills and fever.  Respiratory: Positive for shortness of breath. Negative for cough and wheezing.     Cardiovascular: Positive for leg swelling. Negative for chest pain and palpitations.  Gastrointestinal: Positive for constipation.  Neurological: Negative for light-headedness and headaches.  Hematological: Does not bruise/bleed easily.       Objective:   Vitals:   05/26/20 1308  BP: 114/76  Pulse: 80  Temp: 98.5 F (36.9 C)  SpO2: 95%   BP Readings from Last 3 Encounters:  05/26/20 114/76  11/25/19 122/72  08/23/19 116/79   Wt Readings from Last 3 Encounters:  08/23/19 (!) 331 lb 3.2 oz (150.2 kg)  06/24/19 (!) 323 lb 3.2 oz (146.6 kg)  05/30/19 (!) 320 lb (145.2 kg)   Body mass index is 55.11 kg/m.   Physical Exam    Constitutional: Appears well-developed and well-nourished. No distress.  HENT:  Head: Normocephalic and atraumatic.  Neck: Neck supple. No tracheal deviation present. No thyromegaly present.  No cervical lymphadenopathy Cardiovascular: Normal rate, regular rhythm and normal heart sounds.   No murmur heard. No carotid bruit .  Chronic b/l LE lymphedema with 1 + pitting edema Pulmonary/Chest: Effort normal and breath sounds normal. No respiratory distress. No has no wheezes. No rales.  Skin: Skin is warm and dry. Not diaphoretic.  Psychiatric: Normal mood and affect. Behavior is normal.      Assessment & Plan:    See Problem List for Assessment and Plan of chronic medical problems.    This visit occurred during the SARS-CoV-2 public health emergency.  Safety protocols were in place, including screening questions prior to the visit, additional usage of staff PPE, and extensive cleaning of exam room while observing appropriate contact time as indicated for disinfecting solutions.

## 2020-05-26 ENCOUNTER — Other Ambulatory Visit: Payer: Self-pay

## 2020-05-26 ENCOUNTER — Encounter: Payer: Self-pay | Admitting: Internal Medicine

## 2020-05-26 ENCOUNTER — Ambulatory Visit (INDEPENDENT_AMBULATORY_CARE_PROVIDER_SITE_OTHER): Payer: Medicare HMO | Admitting: Internal Medicine

## 2020-05-26 VITALS — BP 114/76 | HR 80 | Temp 98.5°F | Ht 65.0 in

## 2020-05-26 DIAGNOSIS — M170 Bilateral primary osteoarthritis of knee: Secondary | ICD-10-CM

## 2020-05-26 DIAGNOSIS — R600 Localized edema: Secondary | ICD-10-CM

## 2020-05-26 DIAGNOSIS — G8929 Other chronic pain: Secondary | ICD-10-CM

## 2020-05-26 DIAGNOSIS — L602 Onychogryphosis: Secondary | ICD-10-CM

## 2020-05-26 DIAGNOSIS — E538 Deficiency of other specified B group vitamins: Secondary | ICD-10-CM

## 2020-05-26 DIAGNOSIS — E039 Hypothyroidism, unspecified: Secondary | ICD-10-CM

## 2020-05-26 DIAGNOSIS — I890 Lymphedema, not elsewhere classified: Secondary | ICD-10-CM

## 2020-05-26 DIAGNOSIS — Z23 Encounter for immunization: Secondary | ICD-10-CM

## 2020-05-26 DIAGNOSIS — Z7901 Long term (current) use of anticoagulants: Secondary | ICD-10-CM

## 2020-05-26 DIAGNOSIS — B354 Tinea corporis: Secondary | ICD-10-CM | POA: Insufficient documentation

## 2020-05-26 DIAGNOSIS — N8110 Cystocele, unspecified: Secondary | ICD-10-CM

## 2020-05-26 DIAGNOSIS — Z86711 Personal history of pulmonary embolism: Secondary | ICD-10-CM | POA: Diagnosis not present

## 2020-05-26 DIAGNOSIS — J439 Emphysema, unspecified: Secondary | ICD-10-CM | POA: Diagnosis not present

## 2020-05-26 DIAGNOSIS — M545 Low back pain, unspecified: Secondary | ICD-10-CM

## 2020-05-26 LAB — CBC WITH DIFFERENTIAL/PLATELET
Basophils Absolute: 0 10*3/uL (ref 0.0–0.1)
Basophils Relative: 0.5 % (ref 0.0–3.0)
Eosinophils Absolute: 0.1 10*3/uL (ref 0.0–0.7)
Eosinophils Relative: 1.9 % (ref 0.0–5.0)
HCT: 38 % (ref 36.0–46.0)
Hemoglobin: 12.5 g/dL (ref 12.0–15.0)
Lymphocytes Relative: 16.1 % (ref 12.0–46.0)
Lymphs Abs: 0.9 10*3/uL (ref 0.7–4.0)
MCHC: 32.8 g/dL (ref 30.0–36.0)
MCV: 89 fl (ref 78.0–100.0)
Monocytes Absolute: 0.5 10*3/uL (ref 0.1–1.0)
Monocytes Relative: 8.2 % (ref 3.0–12.0)
Neutro Abs: 4 10*3/uL (ref 1.4–7.7)
Neutrophils Relative %: 73.3 % (ref 43.0–77.0)
Platelets: 161 10*3/uL (ref 150.0–400.0)
RBC: 4.27 Mil/uL (ref 3.87–5.11)
RDW: 14.6 % (ref 11.5–15.5)
WBC: 5.5 10*3/uL (ref 4.0–10.5)

## 2020-05-26 LAB — COMPREHENSIVE METABOLIC PANEL
ALT: 18 U/L (ref 0–35)
AST: 26 U/L (ref 0–37)
Albumin: 3.7 g/dL (ref 3.5–5.2)
Alkaline Phosphatase: 81 U/L (ref 39–117)
BUN: 7 mg/dL (ref 6–23)
CO2: 34 mEq/L — ABNORMAL HIGH (ref 19–32)
Calcium: 8.9 mg/dL (ref 8.4–10.5)
Chloride: 101 mEq/L (ref 96–112)
Creatinine, Ser: 0.7 mg/dL (ref 0.40–1.20)
GFR: 90 mL/min (ref >60.00)
Glucose, Bld: 90 mg/dL (ref 70–99)
Potassium: 3.6 mEq/L (ref 3.5–5.1)
Sodium: 139 mEq/L (ref 135–145)
Total Bilirubin: 0.6 mg/dL (ref 0.2–1.2)
Total Protein: 7.6 g/dL (ref 6.0–8.3)

## 2020-05-26 LAB — VITAMIN B12: Vitamin B-12: 610 pg/mL (ref 211–911)

## 2020-05-26 LAB — TSH: TSH: 9.52 u[IU]/mL — ABNORMAL HIGH (ref 0.35–4.50)

## 2020-05-26 MED ORDER — TALC EX POWD: CUTANEOUS | 0 refills | Status: DC | PRN

## 2020-05-26 MED ORDER — CICLOPIROX OLAMINE 0.77 % EX CREA: TOPICAL_CREAM | Freq: Two times a day (BID) | CUTANEOUS | 2 refills | Status: DC

## 2020-05-26 NOTE — Assessment & Plan Note (Signed)
Chronic Following with pain management Affects her mobility Will get her a platform walker to help her ambulate - unable to use a regular walker because she can not bend over

## 2020-05-26 NOTE — Assessment & Plan Note (Signed)
Chronic Taking vitamin B12 daily Check level

## 2020-05-26 NOTE — Assessment & Plan Note (Signed)
Chronic Nystatin powder not effective - stop Ciclopirox cream BID until resolved an prn Talc powder for prevention Will avoid diflucan given methadone and warfarin

## 2020-05-26 NOTE — Assessment & Plan Note (Signed)
Chronic Severe in nature significantly affects her ability to walk Uses cane at home or scooter at stores Mobility very limited Can not use a regular walker because bending over to use it causes increased back pain  Will request a platform walker Needs transfer seat for shower and shower chair

## 2020-05-26 NOTE — Assessment & Plan Note (Addendum)
Chronic Lifelong anticoagulation-monitored by Jenny Reichmann our Coumadin nurse She does check her INR at home and prefers to stay on warfarin No abnormal bruising or bleeding Continue warfarin dosing per Coumadin clinic

## 2020-05-26 NOTE — Assessment & Plan Note (Signed)
chronic Sees urology and gyn Will refer to urogyn - would like to see one person only

## 2020-05-26 NOTE — Assessment & Plan Note (Addendum)
Chronic, stable Secondary to chronic venous insufficiency and history of DVT Continue elevating legs, low-sodium diet.  Stressed regular exercise, weight loss She does see vascular surgery as needed

## 2020-05-26 NOTE — Assessment & Plan Note (Signed)
Chronic Continue warfarin lifelong for history of PE, DVT Following with Cindy in our Coumadin clinic

## 2020-05-26 NOTE — Assessment & Plan Note (Signed)
Chronic  Clinically euthyroid Currently taking levothyroxine 225 mcg daily Check tsh  Titrate med dose if needed

## 2020-05-27 DIAGNOSIS — M170 Bilateral primary osteoarthritis of knee: Secondary | ICD-10-CM | POA: Diagnosis not present

## 2020-05-27 DIAGNOSIS — R296 Repeated falls: Secondary | ICD-10-CM | POA: Diagnosis not present

## 2020-05-27 MED ORDER — LEVOTHYROXINE SODIUM 50 MCG PO TABS: 50.0000 ug | ORAL_TABLET | Freq: Every day | ORAL | 3 refills | Status: DC

## 2020-05-27 NOTE — Addendum Note (Signed)
Addended by: Binnie Rail on: 05/27/2020 07:44 AM   Modules accepted: Orders

## 2020-05-29 ENCOUNTER — Telehealth: Payer: Self-pay | Admitting: Internal Medicine

## 2020-05-29 DIAGNOSIS — M545 Low back pain, unspecified: Secondary | ICD-10-CM

## 2020-05-29 DIAGNOSIS — M170 Bilateral primary osteoarthritis of knee: Secondary | ICD-10-CM

## 2020-05-29 DIAGNOSIS — G8929 Other chronic pain: Secondary | ICD-10-CM

## 2020-05-29 NOTE — Telephone Encounter (Signed)
Patient states at her last visit they agreed on a good walker to use and Dr. Quay Burow sent over a request for the walker at [facility] [place] called the patient today and stated that they are not the ones who handle those request and to reach out to her doctor.  Patients # [phone]

## 2020-05-29 NOTE — Telephone Encounter (Signed)
I have reached out to [employer] via community message and waiting for response on how to proceed.

## 2020-06-01 ENCOUNTER — Ambulatory Visit (INDEPENDENT_AMBULATORY_CARE_PROVIDER_SITE_OTHER): Payer: Medicare HMO | Admitting: General Practice

## 2020-06-01 DIAGNOSIS — Z7901 Long term (current) use of anticoagulants: Secondary | ICD-10-CM | POA: Diagnosis not present

## 2020-06-01 DIAGNOSIS — Z86718 Personal history of other venous thrombosis and embolism: Secondary | ICD-10-CM | POA: Diagnosis not present

## 2020-06-01 DIAGNOSIS — Z86711 Personal history of pulmonary embolism: Secondary | ICD-10-CM

## 2020-06-01 LAB — POCT INR: INR: 3.2 — AB (ref 2.0–3.0)

## 2020-06-01 NOTE — Patient Instructions (Signed)
Pre visit review using our clinic review tool, if applicable. No additional management support is needed unless otherwise documented below in the visit note.  Skip dosage today and then continue to take 1 1/2 tablets every day except take 1 on Sundays.  Re-check in 2 weeks.  Patient is using [employer] home monitoring. Patient verbalized understanding.

## 2020-06-04 NOTE — Telephone Encounter (Signed)
Order faxed today.

## 2020-06-04 NOTE — Telephone Encounter (Signed)
printed

## 2020-06-04 NOTE — Addendum Note (Signed)
Addended by: Binnie Rail on: 06/04/2020 02:52 PM   Modules accepted: Orders

## 2020-06-08 ENCOUNTER — Encounter: Payer: Self-pay | Admitting: Obstetrics and Gynecology

## 2020-06-08 ENCOUNTER — Other Ambulatory Visit: Payer: Self-pay | Admitting: Obstetrics and Gynecology

## 2020-06-08 ENCOUNTER — Ambulatory Visit (INDEPENDENT_AMBULATORY_CARE_PROVIDER_SITE_OTHER): Payer: Medicare HMO | Admitting: Obstetrics and Gynecology

## 2020-06-08 ENCOUNTER — Other Ambulatory Visit: Payer: Self-pay

## 2020-06-08 ENCOUNTER — Encounter: Payer: Self-pay | Admitting: Internal Medicine

## 2020-06-08 VITALS — BP 118/76 | Wt 315.0 lb

## 2020-06-08 DIAGNOSIS — R319 Hematuria, unspecified: Secondary | ICD-10-CM

## 2020-06-08 DIAGNOSIS — N8110 Cystocele, unspecified: Secondary | ICD-10-CM

## 2020-06-08 DIAGNOSIS — N812 Incomplete uterovaginal prolapse: Secondary | ICD-10-CM

## 2020-06-08 DIAGNOSIS — N3941 Urge incontinence: Secondary | ICD-10-CM | POA: Diagnosis not present

## 2020-06-08 DIAGNOSIS — K5903 Drug induced constipation: Secondary | ICD-10-CM | POA: Diagnosis not present

## 2020-06-08 DIAGNOSIS — N393 Stress incontinence (female) (male): Secondary | ICD-10-CM

## 2020-06-08 DIAGNOSIS — R351 Nocturia: Secondary | ICD-10-CM

## 2020-06-08 DIAGNOSIS — R3914 Feeling of incomplete bladder emptying: Secondary | ICD-10-CM | POA: Diagnosis not present

## 2020-06-08 DIAGNOSIS — N816 Rectocele: Secondary | ICD-10-CM

## 2020-06-08 LAB — POCT URINALYSIS DIPSTICK
Bilirubin, UA: NEGATIVE
Glucose, UA: NEGATIVE
Ketones, UA: NEGATIVE
Nitrite, UA: NEGATIVE
Odor: POSITIVE
Protein, UA: NEGATIVE
Spec Grav, UA: 1.025 (ref 1.010–1.025)
Urobilinogen, UA: 0.2 E.U./dL
pH, UA: 5 (ref 5.0–8.0)

## 2020-06-08 MED ORDER — MIRABEGRON ER 25 MG PO TB24: 25.0000 mg | ORAL_TABLET | Freq: Every day | ORAL | 3 refills | Status: DC
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
## 2020-06-08 NOTE — Patient Instructions (Addendum)
I have prescribed Myrbetriq 25mg  daily for overactive bladder.   You have a stage 2 (out of 4) prolapse.  We discussed the fact that it is not life threatening but there are several treatment options. For treatment of pelvic organ prolapse, we discussed options for management including expectant management, conservative management, and surgical management, such as Kegels, a pessary, pelvic floor physical therapy, and specific surgical procedures.      Constipation: Our goal is to achieve formed bowel movements daily or every-other-day.  You may need to try different combinations of the following options to find what works best for you - everybody's body works differently so feel free to adjust the dosages as needed.  Some options to help maintain bowel health include:  Marland Kitchen Dietary changes (more leafy greens, vegetables and fruits; less processed foods) . Fiber supplementation (Benefiber, Citrucel, FiberCon, Metamucil or Psyllium). Start slow and increase gradually to full dose. . Over-the-counter agents such as: stool softeners (Docusate or Colace) and/or laxatives (Miralax, milk of magnesia)  . "Power Pudding" is a natural mixture that may help your constipation.  To make blend 1 cup applesauce, 1 cup wheat bran, and 3/4 cup prune juice, refrigerate and then take 1 tablespoon daily with a large glass of water as needed.

## 2020-06-10 LAB — URINALYSIS, ROUTINE W REFLEX MICROSCOPIC
Bilirubin, UA: NEGATIVE
Glucose, UA: NEGATIVE
Ketones, UA: NEGATIVE
Nitrite, UA: NEGATIVE
Protein,UA: NEGATIVE
Specific Gravity, UA: <=1.005 — AB (ref 1.005–1.030)
Urobilinogen, Ur: 0.2 mg/dL (ref 0.2–1.0)
pH, UA: 6.5 (ref 5.0–7.5)

## 2020-06-10 LAB — URINE CULTURE: Organism ID, Bacteria: NO GROWTH

## 2020-06-10 LAB — MICROSCOPIC EXAMINATION: Casts: NONE SEEN /lpf

## 2020-06-12 ENCOUNTER — Encounter: Payer: Self-pay | Admitting: Internal Medicine

## 2020-06-15 ENCOUNTER — Other Ambulatory Visit: Payer: Self-pay

## 2020-06-15 ENCOUNTER — Ambulatory Visit (INDEPENDENT_AMBULATORY_CARE_PROVIDER_SITE_OTHER): Payer: Medicare HMO | Admitting: Podiatry

## 2020-06-15 ENCOUNTER — Encounter: Payer: Self-pay | Admitting: Podiatry

## 2020-06-15 ENCOUNTER — Encounter: Payer: Self-pay | Admitting: Internal Medicine

## 2020-06-15 DIAGNOSIS — M21611 Bunion of right foot: Secondary | ICD-10-CM | POA: Diagnosis not present

## 2020-06-15 DIAGNOSIS — M2041 Other hammer toe(s) (acquired), right foot: Secondary | ICD-10-CM | POA: Diagnosis not present

## 2020-06-15 DIAGNOSIS — M2011 Hallux valgus (acquired), right foot: Secondary | ICD-10-CM

## 2020-06-15 DIAGNOSIS — M79674 Pain in right toe(s): Secondary | ICD-10-CM | POA: Diagnosis not present

## 2020-06-15 DIAGNOSIS — M79675 Pain in left toe(s): Secondary | ICD-10-CM | POA: Diagnosis not present

## 2020-06-15 DIAGNOSIS — B351 Tinea unguium: Secondary | ICD-10-CM

## 2020-06-16 ENCOUNTER — Encounter: Payer: Self-pay | Admitting: Internal Medicine

## 2020-06-16 ENCOUNTER — Ambulatory Visit (INDEPENDENT_AMBULATORY_CARE_PROVIDER_SITE_OTHER): Payer: Medicare HMO | Admitting: General Practice

## 2020-06-16 DIAGNOSIS — Z86711 Personal history of pulmonary embolism: Secondary | ICD-10-CM

## 2020-06-16 DIAGNOSIS — Z7901 Long term (current) use of anticoagulants: Secondary | ICD-10-CM

## 2020-06-16 LAB — POCT INR: INR: 3.2 — AB (ref 2.0–3.0)

## 2020-06-16 NOTE — Patient Instructions (Signed)
Pre visit review using our clinic review tool, if applicable. No additional management support is needed unless otherwise documented below in the visit note.  Skip dosage today and then continue to take 1 1/2 tablets every day except take 1 on Sundays and Thursdays.  Re-check in 2 weeks.  Patient is using [employer] home monitoring. Dosing instruction LOVM.

## 2020-06-17 NOTE — Progress Notes (Signed)
  Subjective:  Patient ID: [name], female    DOB: 11/09/1955,  MRN: [id number]  Chief Complaint  Patient presents with  . Nail Problem    nail trim RFC    Bunion and hammertoe 2nd right, sometimes can ache if overdoes it or hits her toe    64 y.o. female presents with the above complaint. History confirmed with patient.   Objective:  Physical Exam: warm, good capillary refill, no trophic changes or ulcerative lesions and normal DP and PT pulses. Onychomycosis x10 with subungual debris, thickening, and yellow discoloration. Moderate to severe hallux valgus right foot, 2nd hammertoe.   Assessment:   1. Hammertoe of right foot   2. Hallux valgus with bunions, right   3. Onychomycosis   4. Pain due to onychomycosis of toenails of both feet      Plan:  Patient was evaluated and treated and all questions answered.   Discussed the etiology and treatment options for the condition in detail with the patient. Educated patient on the topical and oral treatment options for mycotic nails. Recommended debridement of the nails today. Sharp and mechanical debridement performed of all painful and mycotic nails today. Nails debrided in length and thickness using a nail nipper and a mechanical burr to level of comfort. Discussed treatment options including appropriate shoe gear. Follow up as needed for painful nails.  We discussed the etiology and treatment options of bunions and hammertoes in detail. She would prefer to avoid surgery at this time and that is reasonable. Will continue to monitor  Return in about 3 months (around 09/15/2020).

## 2020-06-18 NOTE — Progress Notes (Signed)
Agree with management.  [name] J Jaquavis Felmlee, MD  

## 2020-06-19 ENCOUNTER — Encounter: Payer: Self-pay | Admitting: Internal Medicine

## 2020-06-19 ENCOUNTER — Other Ambulatory Visit: Payer: Self-pay | Admitting: Internal Medicine

## 2020-06-25 ENCOUNTER — Ambulatory Visit
Admission: RE | Admit: 2020-06-25 | Discharge: 2020-06-25 | Disposition: A | Discharge status: Home / Self Care | Payer: Medicare HMO | Source: Ambulatory Visit | Attending: Acute Care | Admitting: Acute Care

## 2020-06-25 DIAGNOSIS — Z87891 Personal history of nicotine dependence: Secondary | ICD-10-CM

## 2020-06-25 DIAGNOSIS — J439 Emphysema, unspecified: Secondary | ICD-10-CM | POA: Diagnosis not present

## 2020-06-25 DIAGNOSIS — I700 Atherosclerosis of aorta: Secondary | ICD-10-CM | POA: Diagnosis not present

## 2020-06-25 DIAGNOSIS — Z122 Encounter for screening for malignant neoplasm of respiratory organs: Secondary | ICD-10-CM

## 2020-06-25 DIAGNOSIS — K760 Fatty (change of) liver, not elsewhere classified: Secondary | ICD-10-CM | POA: Diagnosis not present

## 2020-06-26 NOTE — Progress Notes (Signed)
Please call patient and let them  know their  low dose Ct was read as a Lung RADS 2: nodules that are benign in appearance and behavior with a very low likelihood of becoming a clinically active cancer due to size or lack of growth. Recommendation per radiology is for a repeat LDCT in 12 months. .Please let them  know we will order and schedule their  annual screening scan for 06/2021. Please let them  know there was notation of CAD on their  scan.  Please remind the patient  that this is a non-gated exam therefore degree or severity of disease  cannot be determined. Please have them  follow up with their PCP regarding potential risk factor modification, dietary therapy or pharmacologic therapy if clinically indicated. Pt.  is not  currently on statin therapy. Please place order for annual  screening scan for  06/2021 and fax results to PCP. Thanks so much. 

## 2020-06-26 NOTE — Progress Notes (Signed)
Please let patient know there was notation of Hepatic steatosis. This  is a term that describes the build up of fat in the liver. It is normal to have small amounts of fat in your liver, but when the proportion of liver cells that contain fat exceeds more than 5% it is indicative of early stage fatty liver.Treatment often involves reducing risk factors through a diet and exercise plan. It is generally a benign condition, but in a small percentage of patients it does require follow up. Please have the patient follow up with PCP regarding potential risk factor modification, dietary therapy or pharmacologic therapy if clinically indicated. Thanks so much   

## 2020-06-27 ENCOUNTER — Encounter: Payer: Self-pay | Admitting: Internal Medicine

## 2020-06-29 ENCOUNTER — Telehealth: Payer: Self-pay | Admitting: Acute Care

## 2020-06-29 DIAGNOSIS — Z87891 Personal history of nicotine dependence: Secondary | ICD-10-CM

## 2020-06-30 LAB — POCT INR: INR: 3 (ref 2.0–3.0)

## 2020-06-30 NOTE — Telephone Encounter (Signed)
Pt informed of CT results per Sarah Groce, NP.  PT verbalized understanding.  Copy sent to PCP.  Order placed for 1 yr f/u CT.  

## 2020-07-05 ENCOUNTER — Encounter: Payer: Self-pay | Admitting: Obstetrics and Gynecology

## 2020-07-09 ENCOUNTER — Encounter: Payer: Self-pay | Admitting: Internal Medicine

## 2020-07-14 ENCOUNTER — Ambulatory Visit (INDEPENDENT_AMBULATORY_CARE_PROVIDER_SITE_OTHER): Payer: Medicare HMO | Admitting: General Practice

## 2020-07-14 ENCOUNTER — Ambulatory Visit: Payer: Self-pay | Admitting: General Practice

## 2020-07-14 DIAGNOSIS — Z7901 Long term (current) use of anticoagulants: Secondary | ICD-10-CM

## 2020-07-14 DIAGNOSIS — Z86711 Personal history of pulmonary embolism: Secondary | ICD-10-CM

## 2020-07-14 LAB — POCT INR: INR: 2.2 (ref 2.0–3.0)

## 2020-07-14 NOTE — Patient Instructions (Signed)
Pre visit review using our clinic review tool, if applicable. No additional management support is needed unless otherwise documented below in the visit note. Continue to take 1 1/2 tablets every day except take 1 on Sundays and Thursdays.  Re-check in 2 weeks.  Patient is using [employer] home monitoring. Dosing instruction LOVM.   

## 2020-07-14 NOTE — Progress Notes (Signed)
Agree with management.  Kenyon Eichelberger J Jamaris Theard, MD  

## 2020-07-14 NOTE — Patient Instructions (Addendum)
Pre visit review using our clinic review tool, if applicable. No additional management support is needed unless otherwise documented below in the visit note.  Continue to take 1 1/2 tablets every day except take 1 on Sundays and Thursdays.  Re-check in 2 weeks.  Patient is using [employer] home monitoring. Dosing instruction LOVM.   * INR was taken on 11/23.  Fax received on 12/7

## 2020-07-14 NOTE — Progress Notes (Signed)
Agree with management.  [name] Carmen Anjolaoluwa Siguenza, MD  

## 2020-07-17 NOTE — Progress Notes (Deleted)
[facility] Return Visit  SUBJECTIVE  History of Present Illness: [name] is a 64 y.o. female seen in follow-up for overactive bladder, prolapse and constipation. Plan at last visit was to start Myrbetriq for OAB symptoms, and to add in a fiber supplement or stool softener.     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   Past Surgical History: She  has a past surgical history that includes Gastric bypass (1980s); Tonsillectomy; Cholecystectomy open; Joint replacement; Total hip arthroplasty (Right, 01/2008); Shoulder arthroscopy w/ rotator cuff repair (Right, 08/2002); Knee arthroscopy (Left, 05/2001); Cardiac catheterization (05/2007); and Colonoscopy with propofol (N/A, 08/11/2017).   Medications: She has a current medication list which includes the following prescription(s): bisacodyl, calcium carbonate, ciclopirox, hydromorphone, hyoscyamine, levothyroxine, levothyroxine, methadone, mirabegron er, probiotic product, talc, vitamin b-12, and warfarin.   Allergies: Patient is allergic to amitiza [lubiprostone], morphine, narcan [naloxone hcl], nsaids,  and venlafaxine.   Social History: Patient  reports that she quit smoking about 9 years ago. Her smoking use included cigarettes. She has a 2.70 pack-year smoking history. She has never used smokeless tobacco. She reports that she does not drink alcohol and does not use drugs.      OBJECTIVE     Physical Exam: There were no vitals filed for this visit. Gen: No apparent distress, A&O x 3.  Detailed Urogynecologic Evaluation:  Deferred. Prior exam showed:  No flowsheet data found.     ASSESSMENT AND PLAN    [name] is a 64 y.o. with:  No diagnosis found.

## 2020-07-20 ENCOUNTER — Encounter: Payer: Self-pay | Admitting: Internal Medicine

## 2020-07-23 ENCOUNTER — Ambulatory Visit: Payer: Medicare HMO | Admitting: Obstetrics and Gynecology

## 2020-07-28 ENCOUNTER — Ambulatory Visit (INDEPENDENT_AMBULATORY_CARE_PROVIDER_SITE_OTHER): Payer: Medicare HMO | Admitting: General Practice

## 2020-07-28 DIAGNOSIS — Z7901 Long term (current) use of anticoagulants: Secondary | ICD-10-CM

## 2020-07-28 DIAGNOSIS — Z86718 Personal history of other venous thrombosis and embolism: Secondary | ICD-10-CM | POA: Diagnosis not present

## 2020-07-28 DIAGNOSIS — Z86711 Personal history of pulmonary embolism: Secondary | ICD-10-CM | POA: Diagnosis not present

## 2020-07-28 LAB — POCT INR: INR: 2.7 (ref 2.0–3.0)

## 2020-07-28 NOTE — Patient Instructions (Signed)
Pre visit review using our clinic review tool, if applicable. No additional management support is needed unless otherwise documented below in the visit note. Continue to take 1 1/2 tablets every day except take 1 on Sundays and Thursdays.  Re-check in 2 weeks.  Patient is using [employer] home monitoring. Dosing instruction LOVM.   

## 2020-07-29 NOTE — Progress Notes (Signed)
Agree with management.  [name] J Tamiya Colello, MD  

## 2020-08-12 ENCOUNTER — Encounter: Payer: Self-pay | Admitting: Internal Medicine

## 2020-08-18 ENCOUNTER — Ambulatory Visit (INDEPENDENT_AMBULATORY_CARE_PROVIDER_SITE_OTHER): Payer: Medicare HMO | Admitting: General Practice

## 2020-08-18 DIAGNOSIS — Z86711 Personal history of pulmonary embolism: Secondary | ICD-10-CM | POA: Diagnosis not present

## 2020-08-18 DIAGNOSIS — Z7901 Long term (current) use of anticoagulants: Secondary | ICD-10-CM | POA: Diagnosis not present

## 2020-08-18 LAB — POCT INR: INR: 2.9 (ref 2.0–3.0)

## 2020-08-18 NOTE — Patient Instructions (Addendum)
Pre visit review using our clinic review tool, if applicable. No additional management support is needed unless otherwise documented below in the visit note.  Continue to take 1 1/2 tablets every day except take 1 on Sundays and Thursdays.  Re-check in 2 weeks.  Patient is using [employer] home monitoring. Dosing instruction LOVM.

## 2020-08-19 NOTE — Progress Notes (Signed)
Agree with management.  Lydie Stammen J Latravion Graves, MD  

## 2020-08-20 DIAGNOSIS — Z6841 Body Mass Index (BMI) 40.0 and over, adult: Secondary | ICD-10-CM | POA: Diagnosis not present

## 2020-08-20 DIAGNOSIS — Z779 Other contact with and (suspected) exposures hazardous to health: Secondary | ICD-10-CM | POA: Diagnosis not present

## 2020-08-20 DIAGNOSIS — Z124 Encounter for screening for malignant neoplasm of cervix: Secondary | ICD-10-CM | POA: Diagnosis not present

## 2020-08-20 DIAGNOSIS — Z1231 Encounter for screening mammogram for malignant neoplasm of breast: Secondary | ICD-10-CM | POA: Diagnosis not present

## 2020-09-01 ENCOUNTER — Other Ambulatory Visit: Payer: Self-pay | Admitting: General Practice

## 2020-09-01 ENCOUNTER — Ambulatory Visit (INDEPENDENT_AMBULATORY_CARE_PROVIDER_SITE_OTHER): Payer: Medicare HMO | Admitting: General Practice

## 2020-09-01 DIAGNOSIS — Z86711 Personal history of pulmonary embolism: Secondary | ICD-10-CM | POA: Diagnosis not present

## 2020-09-01 DIAGNOSIS — Z7901 Long term (current) use of anticoagulants: Secondary | ICD-10-CM

## 2020-09-01 LAB — POCT INR: INR: 3.2 — AB (ref 2.0–3.0)

## 2020-09-01 NOTE — Progress Notes (Signed)
Agree with management.  Damel Querry J Quinterious Walraven, MD  

## 2020-09-01 NOTE — Patient Instructions (Signed)
Pre visit review using our clinic review tool, if applicable. No additional management support is needed unless otherwise documented below in the visit note.  Hold dosage today and then continue to take 1 1/2 tablets every day except take 1 on Sundays and Thursdays.  Re-check in 2 weeks.  Patient is using [employer] home monitoring. Dosing instruction LOVM.

## 2020-09-14 ENCOUNTER — Other Ambulatory Visit: Payer: Self-pay | Admitting: Internal Medicine

## 2020-09-17 ENCOUNTER — Other Ambulatory Visit: Payer: Self-pay

## 2020-09-17 ENCOUNTER — Ambulatory Visit (INDEPENDENT_AMBULATORY_CARE_PROVIDER_SITE_OTHER): Payer: Medicare HMO | Admitting: Podiatry

## 2020-09-17 ENCOUNTER — Ambulatory Visit (INDEPENDENT_AMBULATORY_CARE_PROVIDER_SITE_OTHER): Payer: Medicare HMO | Admitting: General Practice

## 2020-09-17 DIAGNOSIS — M2041 Other hammer toe(s) (acquired), right foot: Secondary | ICD-10-CM

## 2020-09-17 DIAGNOSIS — B351 Tinea unguium: Secondary | ICD-10-CM

## 2020-09-17 DIAGNOSIS — Z7901 Long term (current) use of anticoagulants: Secondary | ICD-10-CM

## 2020-09-17 DIAGNOSIS — M2042 Other hammer toe(s) (acquired), left foot: Secondary | ICD-10-CM | POA: Diagnosis not present

## 2020-09-17 DIAGNOSIS — M79675 Pain in left toe(s): Secondary | ICD-10-CM

## 2020-09-17 DIAGNOSIS — Z86711 Personal history of pulmonary embolism: Secondary | ICD-10-CM

## 2020-09-17 DIAGNOSIS — M79676 Pain in unspecified toe(s): Secondary | ICD-10-CM

## 2020-09-17 LAB — POCT INR: INR: 2.7 (ref 2.0–3.0)

## 2020-09-17 NOTE — Progress Notes (Signed)
Agree with management.  Celinda Dethlefs J Stachia Slutsky, MD  

## 2020-09-17 NOTE — Patient Instructions (Addendum)
Pre visit review using our clinic review tool, if applicable. No additional management support is needed unless otherwise documented below in the visit note.  Continue to take 1 1/2 tablets every day except take 1 on Sundays and Thursdays.  Re-check in 2 weeks.  Patient is using [employer] home monitoring. Dosing instruction LOVM.

## 2020-09-19 ENCOUNTER — Encounter: Payer: Self-pay | Admitting: Internal Medicine

## 2020-09-20 ENCOUNTER — Encounter: Payer: Self-pay | Admitting: Podiatry

## 2020-09-20 ENCOUNTER — Other Ambulatory Visit: Payer: Self-pay | Admitting: Internal Medicine

## 2020-09-20 DIAGNOSIS — Z7901 Long term (current) use of anticoagulants: Secondary | ICD-10-CM

## 2020-09-20 NOTE — Progress Notes (Signed)
  Subjective:  Patient ID: [name], female    DOB: 1956/07/10,  MRN: [id number]  Chief Complaint  Patient presents with  . Hammer Toe    Nail trim     65 y.o. female returns with the above complaint. History confirmed with patient. Her toes hurt on the top in the shoes  Objective:  Physical Exam: warm, good capillary refill, no trophic changes or ulcerative lesions and normal DP and PT pulses. Onychomycosis x10 with subungual debris, thickening, and yellow discoloration. Moderate to severe hallux valgus right foot, hammertoes bilaterally, moderate edema   Assessment:   No diagnosis found.   Plan:  Patient was evaluated and treated and all questions answered.   Discussed the etiology and treatment options for the condition in detail with the patient. Educated patient on the topical and oral treatment options for mycotic nails. Recommended debridement of the nails today. Sharp and mechanical debridement performed of all painful and mycotic nails today. Nails debrided in length and thickness using a nail nipper and a mechanical burr to level of comfort. Discussed treatment options including appropriate shoe gear. Follow up as needed for painful nails.  Silicone toe crests dispensed for hammertoes, I advised her to wear upside down to act as a dorsal bumper against shoe gear.  Surgery would carry high risk with her history of cardiac disease and thrombosis risk  No follow-ups on file.

## 2020-09-22 ENCOUNTER — Encounter: Payer: Self-pay | Admitting: Orthopedic Surgery

## 2020-09-22 ENCOUNTER — Ambulatory Visit: Payer: Self-pay

## 2020-09-22 ENCOUNTER — Ambulatory Visit: Payer: Medicare HMO | Admitting: Orthopedic Surgery

## 2020-09-22 ENCOUNTER — Ambulatory Visit (INDEPENDENT_AMBULATORY_CARE_PROVIDER_SITE_OTHER): Payer: Medicare HMO

## 2020-09-22 DIAGNOSIS — G8929 Other chronic pain: Secondary | ICD-10-CM

## 2020-09-22 DIAGNOSIS — M25561 Pain in right knee: Secondary | ICD-10-CM | POA: Diagnosis not present

## 2020-09-22 DIAGNOSIS — M25562 Pain in left knee: Secondary | ICD-10-CM

## 2020-09-22 NOTE — Progress Notes (Signed)
Office Visit Note   Patient: [name]           Date of Birth: May 10, 1956           MRN: [id number] Visit Date: 09/22/2020              Requested by: Binnie Rail, MD [place],  [place] [id number] PCP: Binnie Rail, MD  Chief Complaint  Patient presents with  . Left Knee - Pain  . Right Knee - Pain      HPI: Patient is a 65 year old woman who presents for follow-up of severe osteoarthritis both knees she states she has had good relief with previous injections she is inquiring regarding hyaluronic acid injection.  Assessment & Plan: Visit Diagnoses:  1. Chronic pain of both knees     Plan: We will proceed with a steroid injection at this time may consider hyaluronic acid injections.  Follow-Up Instructions: No follow-ups on file.   Ortho Exam  Patient is alert, oriented, no adenopathy, well-dressed, normal affect, normal respiratory effort. Examination patient is ambulating in a wheelchair.  She has had episodes of mechanical catching locking and giving way and falling with the arthritis of her knees she complains of pain anteriorly and medially she is globally tender to palpation around the knee with swelling there is no redness no cellulitis collaterals and cruciates are stable.  Her last injection was about 2 to 3 years ago on both knees.  She does have venous stasis swelling and recommended double extra-large stockings.  Imaging: XR Knee 1-2 Views Left  Result Date: 09/22/2020 2 view radiographs of the left knee shows tricompartmental arthritic changes bone-on-bone contact medially osteophytic bone spurs in all 3 compartments.  XR Knee 1-2 Views Right  Result Date: 09/22/2020 2 view radiographs of the right knee shows tricompartmental arthritic changes bone-on-bone contact medial joint line osteophytic bone spurs in all 3 compartments  No images are attached to the encounter.  Labs: Lab Results  Component Value Date   HGBA1C 5.5 04/17/2018    HGBA1C 5.5 10/10/2017   HGBA1C 5.5 06/10/2014   REPTSTATUS 10/11/2015 FINAL 10/10/2015   GRAMSTAIN  07/19/2015    RARE WBC PRESENT, PREDOMINANTLY PMN RARE SQUAMOUS EPITHELIAL CELLS PRESENT ABUNDANT GRAM NEGATIVE RODS ABUNDANT GRAM POSITIVE COCCI IN PAIRS Performed at [facility]    GRAMSTAIN  07/19/2015    RARE WBC PRESENT, PREDOMINANTLY PMN NO SQUAMOUS EPITHELIAL CELLS SEEN ABUNDANT GRAM POSITIVE COCCI IN PAIRS Performed at [facility], SUGGEST RECOLLECTION 10/10/2015   LABORGA PSEUDOMONAS AERUGINOSA 07/19/2015   LABORGA STAPHYLOCOCCUS AUREUS 07/19/2015   LABORGA STAPHYLOCOCCUS AUREUS 07/19/2015   LABORGA PSEUDOMONAS AERUGINOSA 07/19/2015     Lab Results  Component Value Date   ALBUMIN 3.7 05/26/2020   ALBUMIN 3.7 11/25/2019   ALBUMIN 4.0 05/24/2019    Lab Results  Component Value Date   MG 1.7 07/13/2016   MG 1.8 05/23/2007   No results found for: VD25OH  No results found for: PREALBUMIN CBC EXTENDED Latest Ref Rng & Units 05/26/2020 11/25/2019 05/24/2019  WBC 4.0 - 10.5 K/uL 5.5 4.5 5.6  RBC 3.87 - 5.11 Mil/uL 4.27 4.33 4.60  HGB 12.0 - 15.0 g/dL 12.5 12.6 13.6  HCT 36.0 - 46.0 % 38.0 38.4 40.8  PLT 150.0 - 400.0 K/uL 161.0 170.0 182.0  NEUTROABS 1.4 - 7.7 K/uL 4.0 3.2 3.7  LYMPHSABS 0.7 - 4.0 K/uL 0.9 0.8 1.3     There  is no height or weight on file to calculate BMI.  Orders:  Orders Placed This Encounter  Procedures  . XR Knee 1-2 Views Right  . XR Knee 1-2 Views Left   No orders of the defined types were placed in this encounter.    Procedures: Large Joint Inj: bilateral knee on 09/22/2020 3:35 PM Indications: pain and diagnostic evaluation Details: 22 G 1.5 in needle, anteromedial approach  Arthrogram: No  Outcome: tolerated well, no immediate complications Procedure, treatment alternatives, risks and benefits explained, specific risks discussed. Consent was given by the patient.  Immediately prior to procedure a time out was called to verify the correct patient, procedure, equipment, support staff and site/side marked as required. Patient was prepped and draped in the usual sterile fashion.      Clinical Data: No additional findings.  ROS:  All other systems negative, except as noted in the HPI. Review of Systems  Objective: Vital Signs: There were no vitals taken for this visit.  Specialty Comments:  No specialty comments available.  PMFS History: Patient Active Problem List   Diagnosis Date Noted  . Tinea corporis 05/26/2020  . Lesion of tongue 11/25/2019  . Left upper arm pain 10/16/2018  . Mixed incontinence 11/10/2017  . B12 deficiency 10/12/2017  . Microscopic hematuria 10/12/2017  . Bilateral primary osteoarthritis of knee 11/18/2016  . Female bladder prolapse 10/10/2016  . Long term current use of anticoagulant 10/10/2016  . Falls frequently 07/14/2016  . Syncope 07/14/2016  . Hyperlipidemia 06/10/2014  . Severe obesity (BMI >= 40) ([place]) 06/10/2014  . CAD (coronary artery disease)   . Interstitial cystitis 01/31/2013  . Chronic narcotic dependence ([place]) 01/30/2013  . S/P hip replacement 11/01/2012  . Venous insufficiency (chronic) (peripheral)   . Atherosclerosis of native arteries of the extremities with ulceration ([place]) 07/21/2011  . Esophageal spasm 06/14/2011  . Hypothyroidism 01/19/2010  . LEG CRAMPS, NOCTURNAL 01/18/2010  . Chronic acquired lymphedema 01/18/2010  . History of pulmonary embolism 08/10/2007  . 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 LOW BACK PAIN, CHRONIC 08/10/2007  . MEDIASTINAL ADENOPATHY CASTLEMANS D 08/10/2007   Past Medical History:  Diagnosis Date  . Anemia, unspecified   . Anxiety   . Arthritis    "about q joint i've got" (07/14/2016)  . Benign neoplasm of colon 12/2007   Hyperplastic colon polyps  . CAD (coronary artery disease)    minimal catheterization, October 2008  . Cellulitis   . Chest  pain    with stress  . Childhood asthma   . Chronic low back pain   . Constipation    and diarrhea chronic..Dr. Alben Spittle  . Depression   . Diverticulosis of colon (without mention of hemorrhage)   . DVT (deep venous thrombosis) ([facility]) 1990s   LLE  . Family history of adverse reaction to anesthesia    "daughter died having her 1st child cause she got too much anesthesia"   . GERD (gastroesophageal reflux disease)   . History of blood transfusion 01/2008   "related to hip OR"  . 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 Hypopotassemia   . Hypotension, unspecified   . Leg pain   . 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  . Methadone adverse reaction    for chronic leg and back pain  . Other pulmonary embolism and infarction 2008   Significant 2008 and coumadin therapy RV dysfunction...echo...2008..EF 50%..right ventricle markedly dilated  w marked right ventricular dysfunc and moder tricuspid regurg/echo..March 2010, Ef 50%, mild dilation of right ventricule w mild decrease right ventric function   . Peripheral vascular disease ([place])   . Rectus sheath hematoma 07/13/2012   On coumadin   . Right bundle branch block    intermittent  . Thyroid disease   . Tobacco use disorder   . Urinary incontinence   . Venous insufficiency (chronic) (peripheral)    Patient's legs were wrapped 2013    Family History  Problem Relation Age of Onset  . Heart disease Mother   . Heart disease Father   . Crohn's disease Sister     Past Surgical History:  Procedure Laterality Date  . CARDIAC CATHETERIZATION  05/2007   Archie Endo 12/08/2010  . CHOLECYSTECTOMY OPEN    . COLONOSCOPY WITH PROPOFOL N/A 08/11/2017   Procedure: COLONOSCOPY WITH PROPOFOL;  Surgeon: Doran Stabler, MD;  Location: [facility];  Service: Gastroenterology;  Laterality: N/A;  . GASTRIC BYPASS  1980s  . JOINT REPLACEMENT    . KNEE ARTHROSCOPY Left 05/2001   Archie Endo  12/21/2010  . SHOULDER ARTHROSCOPY W/ ROTATOR CUFF REPAIR Right 08/2002   Archie Endo 12/21/2010  . TONSILLECTOMY    . TOTAL HIP ARTHROPLASTY Right 01/2008   Social History   Occupational History  . Occupation: Disabled    Employer: UNEMPLOYED  Tobacco Use  . Smoking status: Former Smoker    Packs/day: 0.10    Years: 27.00    Pack years: 2.70    Types: Cigarettes    Quit date: 06/09/2011    Years since quitting: 9.2  . Smokeless tobacco: Never Used  Vaping Use  . Vaping Use: Never used  Substance and Sexual Activity  . Alcohol use: No    Alcohol/week: 0.0 standard drinks    Comment: 07/14/2016 "nothing since the early 1990s"  . Drug use: No  . Sexual activity: Not Currently

## 2020-09-25 ENCOUNTER — Encounter: Payer: Self-pay | Admitting: Internal Medicine

## 2020-09-29 ENCOUNTER — Ambulatory Visit (INDEPENDENT_AMBULATORY_CARE_PROVIDER_SITE_OTHER): Payer: Medicare HMO

## 2020-09-29 ENCOUNTER — Telehealth: Payer: Self-pay

## 2020-09-29 DIAGNOSIS — Z7901 Long term (current) use of anticoagulants: Secondary | ICD-10-CM | POA: Diagnosis not present

## 2020-09-29 DIAGNOSIS — M170 Bilateral primary osteoarthritis of knee: Secondary | ICD-10-CM

## 2020-09-29 DIAGNOSIS — M545 Low back pain, unspecified: Secondary | ICD-10-CM

## 2020-09-29 DIAGNOSIS — G8929 Other chronic pain: Secondary | ICD-10-CM

## 2020-09-29 DIAGNOSIS — Z0000 Encounter for general adult medical examination without abnormal findings: Secondary | ICD-10-CM | POA: Diagnosis not present

## 2020-09-29 DIAGNOSIS — Z86718 Personal history of other venous thrombosis and embolism: Secondary | ICD-10-CM | POA: Diagnosis not present

## 2020-09-29 LAB — POCT INR: INR: 3.1 — AB (ref 2.0–3.0)

## 2020-09-29 MED ORDER — HYOSCYAMINE SULFATE 0.125 MG SL SUBL: 0.1250 mg | SUBLINGUAL_TABLET | SUBLINGUAL | 0 refills | Status: DC

## 2020-09-29 NOTE — Patient Instructions (Signed)
[name] , Thank you for taking time to come for your Medicare Wellness Visit. I appreciate your ongoing commitment to your health goals. Please review the following plan we discussed and let me know if I can assist you in the future.   Screening recommendations/referrals: Colonoscopy: Cologuard done 2022; will be scheduled colonoscopy due to negative results Mammogram: 2022 (per patient) Bone Density: never done Recommended yearly ophthalmology/optometry visit for glaucoma screening and checkup Recommended yearly dental visit for hygiene and checkup  Vaccinations: Influenza vaccine: 05/26/2020 Pneumococcal vaccine: 06/10/2014, 05/24/2019 Tdap vaccine: 06/22/2012; due every 10 years Shingles vaccine: never done   Covid-19: 09/14/2019, 10/05/2019, 07/28/2020  Advanced directives: Please bring a copy of your health care power of attorney and living will to the office at your convenience.  Conditions/risks identified: Yes. Reviewed health maintenance screenings with patient today and relevant education, vaccines, and/or referrals were provided. Continue doing brain stimulating activities (puzzles, reading, adult coloring books, staying active) to keep memory sharp. Continue to eat heart healthy diet (full of fruits, vegetables, whole grains, lean protein, water--limit salt, fat, and sugar intake) and increase physical activity as tolerated.  Next appointment: Please schedule your next Medicare Wellness Visit with your Nurse Health Advisor in 1 year by calling [phone].   Preventive Care 67 Years and Older, Female Preventive care refers to lifestyle choices and visits with your health care provider that can promote health and wellness. What does preventive care include?  A yearly physical exam. This is also called an annual well check.  Dental exams once or twice a year.  Routine eye exams. Ask your health care provider how often you should have your eyes checked.  Personal lifestyle  choices, including:  Daily care of your teeth and gums.  Regular physical activity.  Eating a healthy diet.  Avoiding tobacco and drug use.  Limiting alcohol use.  Practicing safe sex.  Taking low-dose aspirin every day.  Taking vitamin and mineral supplements as recommended by your health care provider. What happens during an annual well check? The services and screenings done by your health care provider during your annual well check will depend on your age, overall health, lifestyle risk factors, and family history of disease. Counseling  Your health care provider may ask you questions about your:  Alcohol use.  Tobacco use.  Drug use.  Emotional well-being.  Home and relationship well-being.  Sexual activity.  Eating habits.  History of falls.  Memory and ability to understand (cognition).  Work and work Statistician.  Reproductive health. Screening  You may have the following tests or measurements:  Height, weight, and BMI.  Blood pressure.  Lipid and cholesterol levels. These may be checked every 5 years, or more frequently if you are over 62 years old.  Skin check.  Lung cancer screening. You may have this screening every year starting at age 52 if you have a 30-pack-year history of smoking and currently smoke or have quit within the past 15 years.  Fecal occult blood test (FOBT) of the stool. You may have this test every year starting at age 54.  Flexible sigmoidoscopy or colonoscopy. You may have a sigmoidoscopy every 5 years or a colonoscopy every 10 years starting at age 56.  Hepatitis C blood test.  Hepatitis B blood test.  Sexually transmitted disease (STD) testing.  Diabetes screening. This is done by checking your blood sugar (glucose) after you have not eaten for a while (fasting). You may have this done every 1-3 years.  Bone  density scan. This is done to screen for osteoporosis. You may have this done starting at age  44.  Mammogram. This may be done every 1-2 years. Talk to your health care provider about how often you should have regular mammograms. Talk with your health care provider about your test results, treatment options, and if necessary, the need for more tests. Vaccines  Your health care provider may recommend certain vaccines, such as:  Influenza vaccine. This is recommended every year.  Tetanus, diphtheria, and acellular pertussis (Tdap, Td) vaccine. You may need a Td booster every 10 years.  Zoster vaccine. You may need this after age 38.  Pneumococcal 13-valent conjugate (PCV13) vaccine. One dose is recommended after age 3.  Pneumococcal polysaccharide (PPSV23) vaccine. One dose is recommended after age 28. Talk to your health care provider about which screenings and vaccines you need and how often you need them. This information is not intended to replace advice given to you by your health care provider. Make sure you discuss any questions you have with your health care provider. Document Released: 08/21/2015 Document Revised: 04/13/2016 Document Reviewed: 05/26/2015 Elsevier Interactive Patient Education  2017 [place] Prevention in the Home Falls can cause injuries. They can happen to people of all ages. There are many things you can do to make your home safe and to help prevent falls. What can I do on the outside of my home?  Regularly fix the edges of walkways and driveways and fix any cracks.  Remove anything that might make you trip as you walk through a door, such as a raised step or threshold.  Trim any bushes or trees on the path to your home.  Use bright outdoor lighting.  Clear any walking paths of anything that might make someone trip, such as rocks or tools.  Regularly check to see if handrails are loose or broken. Make sure that both sides of any steps have handrails.  Any raised decks and porches should have guardrails on the edges.  Have any  leaves, snow, or ice cleared regularly.  Use sand or salt on walking paths during winter.  Clean up any spills in your garage right away. This includes oil or grease spills. What can I do in the bathroom?  Use night lights.  Install grab bars by the toilet and in the tub and shower. Do not use towel bars as grab bars.  Use non-skid mats or decals in the tub or shower.  If you need to sit down in the shower, use a plastic, non-slip stool.  Keep the floor dry. Clean up any water that spills on the floor as soon as it happens.  Remove soap buildup in the tub or shower regularly.  Attach bath mats securely with double-sided non-slip rug tape.  Do not have throw rugs and other things on the floor that can make you trip. What can I do in the bedroom?  Use night lights.  Make sure that you have a light by your bed that is easy to reach.  Do not use any sheets or blankets that are too big for your bed. They should not hang down onto the floor.  Have a firm chair that has side arms. You can use this for support while you get dressed.  Do not have throw rugs and other things on the floor that can make you trip. What can I do in the kitchen?  Clean up any spills right away.  Avoid walking on  wet floors.  Keep items that you use a lot in easy-to-reach places.  If you need to reach something above you, use a strong step stool that has a grab bar.  Keep electrical cords out of the way.  Do not use floor polish or wax that makes floors slippery. If you must use wax, use non-skid floor wax.  Do not have throw rugs and other things on the floor that can make you trip. What can I do with my stairs?  Do not leave any items on the stairs.  Make sure that there are handrails on both sides of the stairs and use them. Fix handrails that are broken or loose. Make sure that handrails are as long as the stairways.  Check any carpeting to make sure that it is firmly attached to the stairs.  Fix any carpet that is loose or worn.  Avoid having throw rugs at the top or bottom of the stairs. If you do have throw rugs, attach them to the floor with carpet tape.  Make sure that you have a light switch at the top of the stairs and the bottom of the stairs. If you do not have them, ask someone to add them for you. What else can I do to help prevent falls?  Wear shoes that:  Do not have high heels.  Have rubber bottoms.  Are comfortable and fit you well.  Are closed at the toe. Do not wear sandals.  If you use a stepladder:  Make sure that it is fully opened. Do not climb a closed stepladder.  Make sure that both sides of the stepladder are locked into place.  Ask someone to hold it for you, if possible.  Clearly mark and make sure that you can see:  Any grab bars or handrails.  First and last steps.  Where the edge of each step is.  Use tools that help you move around (mobility aids) if they are needed. These include:  Canes.  Walkers.  Scooters.  Crutches.  Turn on the lights when you go into a dark area. Replace any light bulbs as soon as they burn out.  Set up your furniture so you have a clear path. Avoid moving your furniture around.  If any of your floors are uneven, fix them.  If there are any pets around you, be aware of where they are.  Review your medicines with your doctor. Some medicines can make you feel dizzy. This can increase your chance of falling. Ask your doctor what other things that you can do to help prevent falls. This information is not intended to replace advice given to you by your health care provider. Make sure you discuss any questions you have with your health care provider. Document Released: 05/21/2009 Document Revised: 12/31/2015 Document Reviewed: 08/29/2014 Elsevier Interactive Patient Education  2017 [employer].

## 2020-09-29 NOTE — Progress Notes (Signed)
I connected with [name] today by telephone and verified that I am speaking with the correct person using two identifiers. Location patient: home Location provider: work Persons participating in the virtual visit: [name] and Carmen Abu, LPN.   I discussed the limitations, risks, security and privacy concerns of performing an evaluation and management service by telephone and the availability of in person appointments. I also discussed with the patient that there may be a patient responsible charge related to this service. The patient expressed understanding and verbally consented to this telephonic visit.    Interactive audio and video telecommunications were attempted between this provider and patient, however failed, due to patient having technical difficulties OR patient did not have access to video capability.  We continued and completed visit with audio only.  Some vital signs may be absent or patient reported.   Time Spent with patient on telephone encounter: 30 minutes  Subjective:   [name] is a 65 y.o. female who presents for Medicare Annual (Subsequent) preventive examination.  Review of Systems    No ROS. Medicare Wellness Visit. Additional risk factors are reflected in social history. Cardiac Risk Factors include: advanced age (>2men, >84 women);dyslipidemia;family history of premature cardiovascular disease;obesity (BMI >30kg/m2)     Objective:    Today's Vitals   09/29/20 1235  PainSc: 6    There is no height or weight on file to calculate BMI.  Advanced Directives 09/29/2020 04/17/2018 12/07/2017 10/09/2017 08/11/2017 07/13/2016 10/10/2015  Does Patient Have a Medical Advance Directive? Yes Yes Yes Yes No No No  Type of Advance Directive - [place];Living will [place];Living will [place];Living will - - -  Does patient want to make changes to medical advance directive? No - Patient  declined - - - - - -  Copy of [place] in Chart? - No - copy requested - - - - -  Would patient like information on creating a medical advance directive? - - - - - No - Patient declined No - patient declined information    Current Medications (verified) Outpatient Encounter Medications as of 09/29/2020  Medication Sig  . bisacodyl (DULCOLAX) 5 MG EC tablet Take 30 mg by mouth daily as needed for moderate constipation.  . calcium carbonate (TUMS - DOSED IN MG ELEMENTAL CALCIUM) 500 MG chewable tablet Chew 1 tablet by mouth as needed for indigestion or heartburn.  . ciclopirox (LOPROX) 0.77 % cream Apply topically 2 (two) times daily.  Marland Kitchen HYDROmorphone (DILAUDID) 4 MG tablet TAKE 1 TO 2 TABLETS BY MOUTH EVERY 6 HOURS AS NEEDED FOR BREAKTHROUGH PAIN  . hyoscyamine (LEVSIN SL) 0.125 MG SL tablet Place 1 tablet (0.125 mg total) under the tongue as directed. Take 1-2 daily as needed.  Marland Kitchen levothyroxine (SYNTHROID) 200 MCG tablet TAKE 1 TABLET (200 MCG TOTAL) BY MOUTH DAILY BEFORE BREAKFAST.  Marland Kitchen levothyroxine (SYNTHROID) 50 MCG tablet Take 1 tablet (50 mcg total) by mouth daily before breakfast. Take in addition to 200 mcg pill for a total of 250 mcg daily  . methadone (DOLOPHINE) 10 MG tablet Take 10 mg by mouth every 8 (eight) hours as needed. for pain  . Probiotic Product (PROBIOTIC DAILY PO) Take by mouth.  . vitamin B-12 (CYANOCOBALAMIN) 1000 MCG tablet Take 1 tablet (1,000 mcg total) by mouth daily.  Marland Kitchen warfarin (COUMADIN) 5 MG tablet Take 1 1/2 tablets daily except take 1 tablet on Sun and Thursday or Take as directed  by anticoagulation clinic  . talc powder Apply topically as needed. (Patient not taking: Reported on 09/29/2020)  . [DISCONTINUED] mirabegron ER (MYRBETRIQ) 25 MG TB24 tablet Take 1 tablet (25 mg total) by mouth daily. (Patient not taking: Reported on 09/29/2020)   No facility-administered encounter medications on file as of 09/29/2020.    Allergies  (verified) Amitiza [lubiprostone], Morphine, Narcan [naloxone hcl], Nsaids, and Venlafaxine   History: Past Medical History:  Diagnosis Date  . Anemia, unspecified   . Anxiety   . Arthritis    "about q joint i've got" (07/14/2016)  . Benign neoplasm of colon 12/2007   Hyperplastic colon polyps  . CAD (coronary artery disease)    minimal catheterization, October 2008  . Cellulitis   . Chest pain    with stress  . Childhood asthma   . Chronic low back pain   . Constipation    and diarrhea chronic..Dr. Alben Spittle  . Depression   . Diverticulosis of colon (without mention of hemorrhage)   . DVT (deep venous thrombosis) (HCC) 1990s   LLE  . Family history of adverse reaction to anesthesia    "daughter died having her 1st child cause she got too much anesthesia"   . GERD (gastroesophageal reflux disease)   . History of blood transfusion 01/2008   "related to hip OR"  . 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 Hypopotassemia   . Hypotension, unspecified   . Leg pain   . 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  . Methadone adverse reaction    for chronic leg and back pain  . Other pulmonary embolism and infarction 2008   Significant 2008 and coumadin therapy RV dysfunction...echo...2008..EF 50%..right ventricle markedly dilated w marked right ventricular dysfunc and moder tricuspid regurg/echo..March 2010, Ef 50%, mild dilation of right ventricule w mild decrease right ventric function   . Peripheral vascular disease ([place])   . Rectus sheath hematoma 07/13/2012   On coumadin   . Right bundle branch block    intermittent  . Thyroid disease   . Tobacco use disorder   . Urinary incontinence   . Venous insufficiency (chronic) (peripheral)    Patient's legs were wrapped 2013   Past Surgical History:  Procedure Laterality Date  . CARDIAC CATHETERIZATION  05/2007   Archie Endo 12/08/2010  . CHOLECYSTECTOMY OPEN    .  COLONOSCOPY WITH PROPOFOL N/A 08/11/2017   Procedure: COLONOSCOPY WITH PROPOFOL;  Surgeon: Doran Stabler, MD;  Location: [facility];  Service: Gastroenterology;  Laterality: N/A;  . GASTRIC BYPASS  1980s  . JOINT REPLACEMENT    . KNEE ARTHROSCOPY Left 05/2001   Archie Endo 12/21/2010  . SHOULDER ARTHROSCOPY W/ ROTATOR CUFF REPAIR Right 08/2002   Archie Endo 12/21/2010  . TONSILLECTOMY    . TOTAL HIP ARTHROPLASTY Right 01/2008   Family History  Problem Relation Age of Onset  . Heart disease Mother   . Heart disease Father   . Crohn's disease Sister    Social History   Socioeconomic History  . Marital status: Divorced    Spouse name: Not on file  . Number of children: 1  . Years of education: Not on file  . Highest education level: Not on file  Occupational History  . Occupation: Disabled    Employer: UNEMPLOYED  Tobacco Use  . Smoking status: Former Smoker    Packs/day: 0.10    Years: 27.00    Pack years: 2.70  Types: Cigarettes    Quit date: 06/09/2011    Years since quitting: 9.3  . Smokeless tobacco: Never Used  Vaping Use  . Vaping Use: Never used  Substance and Sexual Activity  . Alcohol use: No    Alcohol/week: 0.0 standard drinks    Comment: 07/14/2016 "nothing since the early 1990s"  . Drug use: No  . Sexual activity: Not Currently  Other Topics Concern  . Not on file  Social History Narrative   Current smoker wi last 12 mos.    Social Determinants of Health   Financial Resource Strain: Low Risk   . Difficulty of Paying Living Expenses: Not hard at all  Food Insecurity: No Food Insecurity  . Worried About Charity fundraiser in the Last Year: Never true  . Ran Out of Food in the Last Year: Never true  Transportation Needs: No Transportation Needs  . Lack of Transportation (Medical): No  . Lack of Transportation (Non-Medical): No  Physical Activity: Inactive  . Days of Exercise per Week: 0 days  . Minutes of Exercise per Session: 0 min  Stress: No Stress  Concern Present  . Feeling of Stress : Not at all  Social Connections: Socially Isolated  . Frequency of Communication with Friends and Family: More than three times a week  . Frequency of Social Gatherings with Friends and Family: More than three times a week  . Attends Religious Services: Never  . Active Member of Clubs or Organizations: No  . Attends Archivist Meetings: Never  . Marital Status: Never married    Tobacco Counseling Counseling given: Not Answered   Clinical Intake:  Pre-visit preparation completed: Yes  Pain : 0-10 Pain Score: 6  Pain Type: Chronic pain Pain Location: Back Pain Descriptors / Indicators: Discomfort,Restless,Other (Comment) (not able to stand up straight) Pain Onset: More than a month ago Pain Frequency: Constant Pain Relieving Factors: Hydromorphone & Methadone Effect of Pain on Daily Activities: Pain can diminish job performance, lower motivation to exercise, and prevent you from completing daily tasks. Pain produces disability and affects the quality of life.  Pain Relieving Factors: Hydromorphone & Methadone  Nutritional Status: BMI > 30  Obese Nutritional Risks: None Diabetes: No  How often do you need to have someone help you when you read instructions, pamphlets, or other written materials from your doctor or pharmacy?: 1 - Never What is the last grade level you completed in school?: Associate's Degree  Diabetic? no  Interpreter Needed?: No  Information entered by :: Carmen Abu, LPN   Activities of Daily Living In your present state of health, do you have any difficulty performing the following activities: 09/29/2020 05/26/2020  Hearing? N N  Vision? N N  Difficulty concentrating or making decisions? N N  Walking or climbing stairs? Y Y  Dressing or bathing? N N  Doing errands, shopping? N Y  Conservation officer, nature and eating ? N -  Using the Toilet? N -  In the past six months, have you accidently leaked urine? Y -   Do you have problems with loss of bowel control? N -  Managing your Medications? N -  Managing your Finances? N -  Housekeeping or managing your Housekeeping? N -  Some recent data might be hidden    Patient Care Team: Binnie Rail, MD as PCP - General (Internal Medicine) Wayland Salinas, MD as Referring Physician (Neurosurgery) Newt Minion, MD as Consulting Physician (Orthopedic Surgery) Myrlene Broker, MD as  Attending Physician (Urology) Loletha Carrow, Kirke Corin, MD as Consulting Physician (Gastroenterology)  Indicate any recent Medical Services you may have received from other than [facility] providers in the past year (date may be approximate).     Assessment:   This is a routine wellness examination for Yasmen.  Hearing/Vision screen No exam data present  Dietary issues and exercise activities discussed: Current Exercise Habits: The patient does not participate in regular exercise at present, Exercise limited by: orthopedic condition(s)  Goals    . Patient Stated     Increase my physical activity by doing chair exercises and looking into doing water aerobics.      Depression Screen PHQ 2/9 Scores 09/29/2020 05/26/2020 04/17/2018 12/19/2012  PHQ - 2 Score 0 1 4 0  PHQ- 9 Score - 3 8 -    Fall Risk Fall Risk  09/29/2020 11/25/2019 07/03/2019 04/17/2018  Falls in the past year? 0 1 0 Yes  Comment - - Emmi Telephone Survey: data to providers prior to load -  Number falls in past yr: 0 1 - 2 or more  Injury with Fall? 0 1 - -  Risk Factor Category  - - - High Fall Risk  Risk for fall due to : No Fall Risks - - Impaired balance/gait;Impaired mobility  Follow up Falls evaluation completed Falls evaluation completed - Education provided;Falls prevention discussed    FALL RISK PREVENTION PERTAINING TO THE HOME:  Any stairs in or around the home? No  If so, are there any without handrails? No  Home free of loose throw rugs in walkways, pet beds, electrical cords, etc? Yes   Adequate lighting in your home to reduce risk of falls? Yes   ASSISTIVE DEVICES UTILIZED TO PREVENT FALLS:  Life alert? No  Use of a cane, walker or w/c? Yes  Grab bars in the bathroom? Yes  Shower chair or bench in shower? Yes  Elevated toilet seat or a handicapped toilet? No   TIMED UP AND GO:  Was the test performed? No .  Length of time to ambulate 10 feet: 0 sec.   Gait slow and steady with assistive device  Cognitive Function: No flowsheet data found.         Immunizations Immunization History  Administered Date(s) Administered  . Fluad Quad(high Dose 65+) 05/24/2019, 05/26/2020  . Influenza Split 04/22/2011  . Influenza, High Dose Seasonal PF 04/17/2018  . Influenza,inj,Quad PF,6+ Mos 06/03/2013, 06/10/2014, 07/15/2016, 04/12/2017  . PFIZER(Purple Top)SARS-COV-2 Vaccination 09/14/2019, 10/05/2019  . Pneumococcal Conjugate-13 06/10/2014  . Pneumococcal Polysaccharide-23 05/24/2019  . Tdap 06/22/2012    TDAP status: Up to date  Flu Vaccine status: Up to date  Pneumococcal vaccine status: Up to date  Covid-19 vaccine status: Completed vaccines  Qualifies for Shingles Vaccine? Yes   Zostavax completed No   Shingrix Completed?: No.    Education has been provided regarding the importance of this vaccine. Patient has been advised to call insurance company to determine out of pocket expense if they have not yet received this vaccine. Advised may also receive vaccine at local pharmacy or Health Dept. Verbalized acceptance and understanding.  Screening Tests Health Maintenance  Topic Date Due  . MAMMOGRAM  08/11/2018  . DEXA SCAN  Never done  . Fecal DNA (Cologuard)  06/01/2020  . TETANUS/TDAP  06/22/2022  . INFLUENZA VACCINE  Completed  . COVID-19 Vaccine  Completed  . Hepatitis C Screening  Completed  . PNA vac Low Risk Adult  Completed    Health  Maintenance  Health Maintenance Due  Topic Date Due  . MAMMOGRAM  08/11/2018  . DEXA SCAN  Never done  .  Fecal DNA (Cologuard)  06/01/2020    Colorectal cancer screening: Type of screening: Colonoscopy. Completed 08/11/2017. Repeat every 5 years  Mammogram status: Completed 08/11/2016. Repeat every year  Bone density: never done  Lung Cancer Screening: (Low Dose CT Chest recommended if Age 88-80 years, 30 pack-year currently smoking OR have quit w/in 15years.) does qualify.   Lung Cancer Screening Referral: no; Patient gets yearly lung cancer screenings  Additional Screening:  Hepatitis C Screening: does qualify; Completed yes  Vision Screening: Recommended annual ophthalmology exams for early detection of glaucoma and other disorders of the eye. Is the patient up to date with their annual eye exam?  No  Who is the provider or what is the name of the office in which the patient attends annual eye exams? Refusal If pt is not established with a provider, would they like to be referred to a provider to establish care? No .   Dental Screening: Recommended annual dental exams for proper oral hygiene  Community Resource Referral / Chronic Care Management: CRR required this visit?  No   CCM required this visit?  No      Plan:     I have personally reviewed and noted the following in the patient's chart:   . Medical and social history . Use of alcohol, tobacco or illicit drugs  . Current medications and supplements . Functional ability and status . Nutritional status . Physical activity . Advanced directives . List of other physicians . Hospitalizations, surgeries, and ER visits in previous 12 months . Vitals . Screenings to include cognitive, depression, and falls . Referrals and appointments  In addition, I have reviewed and discussed with patient certain preventive protocols, quality metrics, and best practice recommendations. A written personalized care plan for preventive services as well as general preventive health recommendations were provided to patient.     Sheral Flow, LPN   4/32/7614   Nurse Notes:  Patient is cogitatively intact. There were no vitals filed for this visit. There is no height or weight on file to calculate BMI. Medications reviewed with patient; yes, opioid use noted.

## 2020-09-29 NOTE — Telephone Encounter (Signed)
Hyoscyamine sent to pharmacy.  DME for [employer]

## 2020-09-30 ENCOUNTER — Ambulatory Visit (INDEPENDENT_AMBULATORY_CARE_PROVIDER_SITE_OTHER): Payer: Medicare HMO | Admitting: General Practice

## 2020-09-30 ENCOUNTER — Telehealth: Payer: Self-pay | Admitting: Internal Medicine

## 2020-09-30 DIAGNOSIS — Z86711 Personal history of pulmonary embolism: Secondary | ICD-10-CM

## 2020-09-30 DIAGNOSIS — Z7901 Long term (current) use of anticoagulants: Secondary | ICD-10-CM

## 2020-09-30 NOTE — Patient Instructions (Signed)
Pre visit review using our clinic review tool, if applicable. No additional management support is needed unless otherwise documented below in the visit note.  Take 1 tablet today and then continue to take 1 1/2 tablets every day except take 1 on Sundays and Thursdays.  Re-check in 2 weeks.  Patient is using [employer] home monitoring. Dosing instruction LOVM.

## 2020-09-30 NOTE — Progress Notes (Signed)
  Chronic Care Management   Outreach Note  09/30/2020 Name: [name] MRN: [id number] DOB: Mar 06, 1956  Referred by: Binnie Rail, MD Reason for referral : No chief complaint on file.   An unsuccessful telephone outreach was attempted today. The patient was referred to the pharmacist for assistance with care management and care coordination.   Follow Up Plan:   Carley Perdue [employer] Scheduler

## 2020-10-01 ENCOUNTER — Telehealth: Payer: Self-pay | Admitting: Internal Medicine

## 2020-10-01 ENCOUNTER — Encounter: Payer: Self-pay | Admitting: Internal Medicine

## 2020-10-01 MED ORDER — DICYCLOMINE HCL 20 MG PO TABS: 20.0000 mg | ORAL_TABLET | Freq: Four times a day (QID) | ORAL | 5 refills | Status: DC | PRN

## 2020-10-01 NOTE — Progress Notes (Signed)
I have reviewed the results and agree with this plan   

## 2020-10-01 NOTE — Progress Notes (Signed)
  Chronic Care Management   Outreach Note  10/01/2020 Name: [name] MRN: [id number] DOB: 08-15-1955  Referred by: Binnie Rail, MD Reason for referral : No chief complaint on file.   A second unsuccessful telephone outreach was attempted today. The patient was referred to pharmacist for assistance with care management and care coordination.  Follow Up Plan:   Carley Perdue [employer] Scheduler

## 2020-10-09 ENCOUNTER — Telehealth: Payer: Self-pay | Admitting: Internal Medicine

## 2020-10-09 NOTE — Progress Notes (Signed)
°  Chronic Care Management   Note  10/09/2020 Name: [name] MRN: [id number] DOB: 06-03-1956  [name] is a 65 y.o. year old female who is a primary care patient of Burns, Claudina Lick, MD. I reached out to [name] by phone today in response to a referral sent by [name] PCP, Binnie Rail, MD.   [name] was given information about Chronic Care Management services today including:  1. CCM service includes personalized support from designated clinical staff supervised by her physician, including individualized plan of care and coordination with other care providers 2. 24/7 contact phone numbers for assistance for urgent and routine care needs. 3. Service will only be billed when office clinical staff spend 20 minutes or more in a month to coordinate care. 4. Only one practitioner may furnish and bill the service in a calendar month. 5. The patient may stop CCM services at any time (effective at the end of the month) by phone call to the office staff.   Patient agreed to services and verbal consent obtained.   Follow up plan:   Carley Perdue [employer] Scheduler

## 2020-10-12 ENCOUNTER — Other Ambulatory Visit: Payer: Self-pay | Admitting: Internal Medicine

## 2020-10-14 ENCOUNTER — Ambulatory Visit (INDEPENDENT_AMBULATORY_CARE_PROVIDER_SITE_OTHER): Payer: Medicare HMO | Admitting: General Practice

## 2020-10-14 DIAGNOSIS — Z86711 Personal history of pulmonary embolism: Secondary | ICD-10-CM | POA: Diagnosis not present

## 2020-10-14 DIAGNOSIS — Z7901 Long term (current) use of anticoagulants: Secondary | ICD-10-CM

## 2020-10-14 LAB — POCT INR: INR: 1.9 — AB (ref 2.0–3.0)

## 2020-10-14 NOTE — Patient Instructions (Signed)
Pre visit review using our clinic review tool, if applicable. No additional management support is needed unless otherwise documented below in the visit note.  Take 2 tablets today and then continue to take 1 1/2 tablets every day except take 1 on Sundays and Thursdays.  Re-check in 2 weeks.  Patient is using [employer] home monitoring. Dosing instruction LOVM.

## 2020-10-15 NOTE — Progress Notes (Signed)
I have reviewed the results and agree with this plan   

## 2020-10-18 ENCOUNTER — Encounter: Payer: Self-pay | Admitting: Internal Medicine

## 2020-10-27 ENCOUNTER — Ambulatory Visit (INDEPENDENT_AMBULATORY_CARE_PROVIDER_SITE_OTHER): Payer: Medicare HMO | Admitting: General Practice

## 2020-10-27 DIAGNOSIS — Z86711 Personal history of pulmonary embolism: Secondary | ICD-10-CM | POA: Diagnosis not present

## 2020-10-27 DIAGNOSIS — Z7901 Long term (current) use of anticoagulants: Secondary | ICD-10-CM

## 2020-10-27 LAB — POCT INR: INR: 2 (ref 2.0–3.0)

## 2020-10-27 NOTE — Patient Instructions (Signed)
Pre visit review using our clinic review tool, if applicable. No additional management support is needed unless otherwise documented below in the visit note.  Continue to take 1 1/2 tablets every day except take 1 on Sundays and Thursdays.  Re-check in 2 weeks.  Patient is using [employer] home monitoring. Dosing instruction LOVM.

## 2020-10-28 NOTE — Progress Notes (Signed)
Agree with management.  Stacy J Burns, MD  

## 2020-11-10 ENCOUNTER — Ambulatory Visit (INDEPENDENT_AMBULATORY_CARE_PROVIDER_SITE_OTHER): Payer: Medicare HMO | Admitting: General Practice

## 2020-11-10 DIAGNOSIS — Z86711 Personal history of pulmonary embolism: Secondary | ICD-10-CM | POA: Diagnosis not present

## 2020-11-10 DIAGNOSIS — Z7901 Long term (current) use of anticoagulants: Secondary | ICD-10-CM | POA: Diagnosis not present

## 2020-11-10 LAB — POCT INR: INR: 2.1 (ref 2.0–3.0)

## 2020-11-10 NOTE — Progress Notes (Signed)
Agree with management.   [place] J Nigel Ericsson, MD  

## 2020-11-10 NOTE — Patient Instructions (Signed)
Pre visit review using our clinic review tool, if applicable. No additional management support is needed unless otherwise documented below in the visit note. Continue to take 1 1/2 tablets every day except take 1 on Sundays and Thursdays.  Re-check in 2 weeks.  Patient is using [employer] home monitoring. Dosing instruction LOVM.

## 2020-11-24 ENCOUNTER — Ambulatory Visit (INDEPENDENT_AMBULATORY_CARE_PROVIDER_SITE_OTHER): Payer: Medicare HMO | Admitting: General Practice

## 2020-11-24 DIAGNOSIS — Z7901 Long term (current) use of anticoagulants: Secondary | ICD-10-CM | POA: Diagnosis not present

## 2020-11-24 DIAGNOSIS — Z86711 Personal history of pulmonary embolism: Secondary | ICD-10-CM

## 2020-11-24 DIAGNOSIS — Z86718 Personal history of other venous thrombosis and embolism: Secondary | ICD-10-CM | POA: Diagnosis not present

## 2020-11-24 LAB — POCT INR: INR: 2.1 (ref 2.0–3.0)

## 2020-11-24 NOTE — Patient Instructions (Signed)
Pre visit review using our clinic review tool, if applicable. No additional management support is needed unless otherwise documented below in the visit note.  Continue to take 1 1/2 tablets every day except take 1 on Sundays and Thursdays.  Re-check in 2 weeks.  Patient is using [employer] home monitoring. Dosing instruction LOVM.

## 2020-11-24 NOTE — Progress Notes (Signed)
Agree with management.  Jad Johansson J Suleman Gunning, MD  

## 2020-11-27 ENCOUNTER — Telehealth: Payer: Self-pay

## 2020-11-27 NOTE — Progress Notes (Signed)
Chronic Care Management Pharmacy Assistant   Name: [name]  MRN: [id number] DOB: 1955-12-28  Reason for Encounter: Initial Questions Appointment:  Telephone 11/30/20 @ 2 pm  Recent office visits:  None noted.  Recent consult visits:  3/22 1 mo ago. Dr Marcell Barlow - nerosugeon -  06/08/20 Wannetta Sender (GYN/Urology) - Seen for Urge incontinence, patient started Myrbetriq 25 mg daily, instructed to  f/u 6 wks  06/15/20 McDonald (Podiatry) - Seen for Hammertoe, no changes, instructed to  f/u 3 mos  09/17/20 McDonald (Podiatry) - Seen for Hammertoe, no changes  Hospital visits:  None in previous 6 months  Medications: Outpatient Encounter Medications as of 11/27/2020  Medication Sig  . bisacodyl (DULCOLAX) 5 MG EC tablet Take 30 mg by mouth daily as needed for moderate constipation.  . calcium carbonate (TUMS - DOSED IN MG ELEMENTAL CALCIUM) 500 MG chewable tablet Chew 1 tablet by mouth as needed for indigestion or heartburn.  . ciclopirox (LOPROX) 0.77 % cream Apply topically 2 (two) times daily.  Marland Kitchen dicyclomine (BENTYL) 20 MG tablet Take 1 tablet (20 mg total) by mouth every 6 (six) hours as needed for spasms.  Marland Kitchen HYDROmorphone (DILAUDID) 4 MG tablet TAKE 1 TO 2 TABLETS BY MOUTH EVERY 6 HOURS AS NEEDED FOR BREAKTHROUGH PAIN  . hyoscyamine (LEVSIN SL) 0.125 MG SL tablet PLACE 1 TABLET (0.125 MG TOTAL) UNDER THE TONGUE AS DIRECTED. TAKE 1-2 DAILY AS NEEDED.  Marland Kitchen levothyroxine (SYNTHROID) 200 MCG tablet TAKE 1 TABLET (200 MCG TOTAL) BY MOUTH DAILY BEFORE BREAKFAST.  Marland Kitchen levothyroxine (SYNTHROID) 50 MCG tablet Take 1 tablet (50 mcg total) by mouth daily before breakfast. Take in addition to 200 mcg pill for a total of 250 mcg daily  . methadone (DOLOPHINE) 10 MG tablet Take 10 mg by mouth every 8 (eight) hours as needed. for pain  . Probiotic Product (PROBIOTIC DAILY PO) Take by mouth.  . vitamin B-12 (CYANOCOBALAMIN) 1000 MCG tablet Take 1 tablet (1,000 mcg total) by mouth daily.  Marland Kitchen warfarin  (COUMADIN) 5 MG tablet Take 1 1/2 tablets daily except take 1 tablet on Sun and Thursday or Take as directed by anticoagulation clinic   No facility-administered encounter medications on file as of 11/27/2020.   Have you seen any other providers since your last visit?   Patient states she seen Dr Marcell Barlow, her neurosurgeon in March 2022.  Any changes in your medications or health?  Patient states no changes at this time.  Any side effects from any medications?  Patient states no side effects at this time, she did have to have several trials with her medications before finding the right fit for her.  Do you have an symptoms or problems not managed by your medications?  Patient states she no longer take meds for her Prolapsed bladder, she tried Myrbetriq but was unable to take this medication.  Any concerns about your health right now?  Patient states that she needs to have several surgeries, on her Rotator cuff, Knees and dental work but due to her taking coumadin no one wants to take a risks with blood clots.  Has your provider asked that you check blood pressure, blood sugar, or follow special diet at home?  Patient states not at this time.  Do you get any type of exercise on a regular basis?  Patient states she can barely walk long distance so no exercises at this time.  Can you think of a goal you would like to reach for your health?  Patient states she would love to walk upright again and lose weight.  Do you have any problems getting your medications?  Patient states not at this time.  Is there anything that you would like to discuss during the appointment?  Patient is concerned about her medications being changed and does not want any changes to current regimen.  Please bring medications and supplements to appointment   Orinda Kenner, [place] Clinical Pharmacists Assistant [phone]  Time Spent: [phone]

## 2020-11-28 ENCOUNTER — Encounter: Payer: Self-pay | Admitting: Internal Medicine

## 2020-11-29 ENCOUNTER — Encounter: Payer: Self-pay | Admitting: Internal Medicine

## 2020-11-30 ENCOUNTER — Ambulatory Visit (INDEPENDENT_AMBULATORY_CARE_PROVIDER_SITE_OTHER): Payer: Medicare HMO | Admitting: Pharmacist

## 2020-11-30 ENCOUNTER — Other Ambulatory Visit: Payer: Self-pay

## 2020-11-30 DIAGNOSIS — K224 Dyskinesia of esophagus: Secondary | ICD-10-CM

## 2020-11-30 DIAGNOSIS — I2510 Atherosclerotic heart disease of native coronary artery without angina pectoris: Secondary | ICD-10-CM

## 2020-11-30 DIAGNOSIS — E039 Hypothyroidism, unspecified: Secondary | ICD-10-CM

## 2020-11-30 DIAGNOSIS — E782 Mixed hyperlipidemia: Secondary | ICD-10-CM

## 2020-11-30 DIAGNOSIS — G8929 Other chronic pain: Secondary | ICD-10-CM

## 2020-11-30 DIAGNOSIS — Z86711 Personal history of pulmonary embolism: Secondary | ICD-10-CM

## 2020-11-30 DIAGNOSIS — K5903 Drug induced constipation: Secondary | ICD-10-CM

## 2020-11-30 DIAGNOSIS — T402X5A Adverse effect of other opioids, initial encounter: Secondary | ICD-10-CM

## 2020-11-30 DIAGNOSIS — M545 Low back pain, unspecified: Secondary | ICD-10-CM

## 2020-11-30 NOTE — Telephone Encounter (Signed)
Please Advise

## 2020-11-30 NOTE — Progress Notes (Signed)
Chronic Care Management Pharmacy Note  12/01/2020 Name:  [name] MRN:  [id number] DOB:  Dec 28, 1955  Subjective: [name] is an 65 y.o. year old female who is a primary patient of Burns, Claudina Lick, MD.  The CCM team was consulted for assistance with disease management and care coordination needs.    Engaged with patient by telephone for initial visit in response to provider referral for pharmacy case management and/or care coordination services.   Consent to Services:  The patient was given the following information about Chronic Care Management services today, agreed to services, and gave verbal consent: 1. CCM service includes personalized support from designated clinical staff supervised by the primary care provider, including individualized plan of care and coordination with other care providers 2. 24/7 contact phone numbers for assistance for urgent and routine care needs. 3. Service will only be billed when office clinical staff spend 20 minutes or more in a month to coordinate care. 4. Only one practitioner may furnish and bill the service in a calendar month. 5.The patient may stop CCM services at any time (effective at the end of the month) by phone call to the office staff. 6. The patient will be responsible for cost sharing (co-pay) of up to 20% of the service fee (after annual deductible is met). Patient agreed to services and consent obtained.  Patient Care Team: Binnie Rail, MD as PCP - General (Internal Medicine) Wayland Salinas, MD as Referring Physician (Neurosurgery) Newt Minion, MD as Consulting Physician (Orthopedic Surgery) Myrlene Broker, MD as Attending Physician (Urology) Loletha Carrow Kirke Corin, MD as Consulting Physician (Gastroenterology) Charlton Haws, [facility] as Pharmacist (Pharmacist)   Patient lives alone, she does not drive anymore. Her daughter passed away recently. She uses uber/lyft to get around or she has 2 friends who can drive her places  occasionally.   "If they don't deliver it, I don't eat it"; she uses grocery delivery from [place].  Has several grandchildren, local and in [place].  Recent office visits: 09/25/20 pt message: rx'd dicyclomine d/t hyoscyamine not covered.  05/26/20 Dr Quay Burow OV: chronic f/u. C/o oozing rash on torso. Rec'd platform walker. Referred to urogyn for bladder prolapse. Stopped nystatin powder and rx'd ciclopirox cream. Want to avoid diflucan d/t methadone and warfarin. Increased levothyroxine to 250 mcg daily. Ordered TSH for 2 mos but pt did not go.  Recent consult visits: 06/08/20 Wannetta Sender (GYN/Urology) - Seen for Urge incontinence, patient started Myrbetriq 25 mg daily, instructed to  f/u 6 wks  06/15/20 McDonald (Podiatry) - Seen for Hammertoe, no changes, instructed to  f/u 3 mos  09/17/20 McDonald (Podiatry) - Seen for Hammertoe, no changes  Hospital visits: None in previous 6 months  Objective:  Lab Results  Component Value Date   CREATININE 0.70 05/26/2020   BUN 7 05/26/2020   GFR 90.00 05/26/2020   GFRNONAA >60 07/14/2016   GFRAA >60 07/14/2016   NA 139 05/26/2020   K 3.6 05/26/2020   CALCIUM 8.9 05/26/2020   CO2 34 (H) 05/26/2020   GLUCOSE 90 05/26/2020    Lab Results  Component Value Date/Time   HGBA1C 5.5 04/17/2018 02:29 PM   HGBA1C 5.5 10/10/2017 03:06 PM   GFR 90.00 05/26/2020 01:58 PM   GFR 83.74 11/25/2019 02:56 PM    Last diabetic Eye exam: No results found for: HMDIABEYEEXA  Last diabetic Foot exam: No results found for: HMDIABFOOTEX   Lab Results  Component Value Date   CHOL 169 05/24/2019  HDL 32.50 (L) 05/24/2019   LDLCALC 69 04/17/2018   LDLDIRECT 105.0 05/24/2019   TRIG 212.0 (H) 05/24/2019   CHOLHDL 5 05/24/2019    Hepatic Function Latest Ref Rng & Units 05/26/2020 11/25/2019 05/24/2019  Total Protein 6.0 - 8.3 g/dL 7.6 7.4 8.0  Albumin 3.5 - 5.2 g/dL 3.7 3.7 4.0  AST 0 - 37 U/L '26 19 24  ' ALT 0 - 35 U/L '18 11 17  ' Alk Phosphatase 39 - 117  U/L 81 71 97  Total Bilirubin 0.2 - 1.2 mg/dL 0.6 0.6 0.5  Bilirubin, Direct 0.0 - 0.3 mg/dL - - -    Lab Results  Component Value Date/Time   TSH 9.52 (H) 05/26/2020 01:58 PM   TSH 3.33 11/25/2019 02:56 PM   FREET4 0.56 (L) 07/13/2016 09:24 PM   FREET4 0.69 07/24/2014 10:14 AM    CBC Latest Ref Rng & Units 05/26/2020 11/25/2019 05/24/2019  WBC 4.0 - 10.5 K/uL 5.5 4.5 5.6  Hemoglobin 12.0 - 15.0 g/dL 12.5 12.6 13.6  Hematocrit 36.0 - 46.0 % 38.0 38.4 40.8  Platelets 150.0 - 400.0 K/uL 161.0 170.0 182.0    No results found for: VD25OH  Clinical ASCVD: Yes  The 10-year ASCVD risk score Mikey Bussing DC Jr., et al., 2013) is: 5.4%   Values used to calculate the score:     Age: 70 years     Sex: Female     Is Non-Hispanic African American: No     Diabetic: No     Tobacco smoker: No     Systolic Blood Pressure: 914 mmHg     Is BP treated: No     HDL Cholesterol: 32.5 mg/dL     Total Cholesterol: 169 mg/dL    Depression screen [facility] 2/9 09/29/2020 05/26/2020 04/17/2018  Decreased Interest 0 1 2  Down, Depressed, Hopeless 0 0 2  PHQ - 2 Score 0 1 4  Altered sleeping - 1 0  Tired, decreased energy - 1 2  Change in appetite - 0 1  Feeling bad or failure about yourself  - 0 1  Trouble concentrating - 0 0  Moving slowly or fidgety/restless - 0 0  Suicidal thoughts - 0 0  PHQ-9 Score - 3 8  Difficult doing work/chores - Somewhat difficult Not difficult at all  Some recent data might be hidden    Lab Results  Component Value Date/Time   INR 2.1 11/24/2020 12:00 AM   INR 2.1 11/10/2020 12:00 AM   INR 2.4 (H) 10/10/2016 04:10 PM   INR 4.7 (H) 08/11/2016 11:57 AM   INR 2.1 09/30/2010 03:50 PM   INR 3.8 08/17/2010 02:12 PM    Social History   Tobacco Use  Smoking Status Former Smoker  . Packs/day: 0.10  . Years: 27.00  . Pack years: 2.70  . Types: Cigarettes  . Quit date: 06/09/2011  . Years since quitting: 9.4  Smokeless Tobacco Never Used   BP Readings from Last 3  Encounters:  06/08/20 118/76  05/26/20 114/76  11/25/19 122/72   Pulse Readings from Last 3 Encounters:  05/26/20 80  11/25/19 67  08/23/19 84   Wt Readings from Last 3 Encounters:  06/08/20 (!) 315 lb (142.9 kg)  08/23/19 (!) 331 lb 3.2 oz (150.2 kg)  06/24/19 (!) 323 lb 3.2 oz (146.6 kg)   BMI Readings from Last 3 Encounters:  06/08/20 52.42 kg/m  05/26/20 55.11 kg/m  11/25/19 55.11 kg/m    Assessment/Interventions: Review of patient past medical history, allergies,  medications, health status, including review of consultants reports, laboratory and other test data, was performed as part of comprehensive evaluation and provision of chronic care management services.   SDOH:  (Social Determinants of Health) assessments and interventions performed: Yes  SDOH Screenings   Alcohol Screen: Low Risk   . Last Alcohol Screening Score (AUDIT): 0  Depression (PHQ2-9): Low Risk   . PHQ-2 Score: 0  Financial Resource Strain: Low Risk   . Difficulty of Paying Living Expenses: Not hard at all  Food Insecurity: No Food Insecurity  . Worried About Charity fundraiser in the Last Year: Never true  . Ran Out of Food in the Last Year: Never true  Housing: Low Risk   . Last Housing Risk Score: 0  Physical Activity: Inactive  . Days of Exercise per Week: 0 days  . Minutes of Exercise per Session: 0 min  Social Connections: Socially Isolated  . Frequency of Communication with Friends and Family: More than three times a week  . Frequency of Social Gatherings with Friends and Family: More than three times a week  . Attends Religious Services: Never  . Active Member of Clubs or Organizations: No  . Attends Archivist Meetings: Never  . Marital Status: Never married  Stress: No Stress Concern Present  . Feeling of Stress : Not at all  Tobacco Use: Medium Risk  . Smoking Tobacco Use: Former Smoker  . Smokeless Tobacco Use: Never Used  Transportation Needs: No Transportation  Needs  . Lack of Transportation (Medical): No  . Lack of Transportation (Non-Medical): No    CCM Care Plan  Allergies  Allergen Reactions  . Amitiza [Lubiprostone] Diarrhea and Nausea And Vomiting  . Morphine Anaphylaxis    REACTION: nausea, rash, itching, vomiting  . Narcan [Naloxone Hcl] Other (See Comments)    Pt was not her self  . Nsaids     Bowel irregularity (coffee grounds)  . Venlafaxine Other (See Comments)    itching    Medications Reviewed Today    Reviewed by Charlton Haws, [facility] (Pharmacist) on 11/30/20 at [place] List Status: <None>  Medication Order Taking? Sig Documenting Provider Last Dose Status Informant  bisacodyl (DULCOLAX) 5 MG EC tablet [id number] Yes Take 30 mg by mouth daily as needed for moderate constipation. [provider] Taking Active Self  calcium carbonate (TUMS - DOSED IN MG ELEMENTAL CALCIUM) 500 MG chewable tablet [id number] Yes Chew 1 tablet by mouth as needed for indigestion or heartburn. [provider] Taking Active Self           Med Note Luna Glasgow Nov 30, 2020  3:29 PM) Separate from levothyroxine by at least 4 hours  ciclopirox (LOPROX) 0.77 % cream [id number] Yes Apply topically 2 (two) times daily. Binnie Rail, MD Taking Active   dicyclomine (BENTYL) 20 MG tablet [id number] Yes Take 1 tablet (20 mg total) by mouth every 6 (six) hours as needed for spasms. Binnie Rail, MD Taking Active   HYDROmorphone (DILAUDID) 4 MG tablet [id number] Yes TAKE 1 TO 2 TABLETS BY MOUTH EVERY 6 HOURS AS NEEDED FOR [facility] PAIN [provider] Taking Active Self           Med Note (DAVIS, SOPHIA A   Thu Jun 15, 2017  2:47 PM)    hyoscyamine (LEVSIN SL) 0.125 MG SL tablet [id number] Yes PLACE 1 TABLET (0.125 MG TOTAL) UNDER THE TONGUE AS DIRECTED. TAKE 1-2 DAILY  AS NEEDED. Binnie Rail, MD Taking Active   levothyroxine (SYNTHROID) 200 MCG tablet [id number] Yes TAKE 1 TABLET (200 MCG TOTAL) BY MOUTH DAILY  BEFORE BREAKFAST. Binnie Rail, MD Taking Active   levothyroxine (SYNTHROID) 50 MCG tablet [id number] Yes Take 1 tablet (50 mcg total) by mouth daily before breakfast. Take in addition to 200 mcg pill for a total of 250 mcg daily Burns, Claudina Lick, MD Taking Active   methadone (DOLOPHINE) 10 MG tablet [id number] Yes Take 10 mg by mouth every 8 (eight) hours as needed. for pain [provider] Taking Active Self           Med Note Rosemarie Beath, MELISSA B   Wed Aug 02, 2017  6:37 PM)    Probiotic Product (PROBIOTIC DAILY PO) [id number] Yes Take by mouth. [provider] Taking Active   vitamin B-12 (CYANOCOBALAMIN) 1000 MCG tablet [id number] Yes Take 1 tablet (1,000 mcg total) by mouth daily. Binnie Rail, MD Taking Active   warfarin (COUMADIN) 5 MG tablet [id number] Yes Take 1 1/2 tablets daily except take 1 tablet on Sun and Thursday or Take as directed by anticoagulation clinic Burns, Claudina Lick, MD Taking Active           Patient Active Problem List   Diagnosis Date Noted  . Tinea corporis 05/26/2020  . Lesion of tongue 11/25/2019  . Left upper arm pain 10/16/2018  . Mixed incontinence 11/10/2017  . B12 deficiency 10/12/2017  . Microscopic hematuria 10/12/2017  . Bilateral primary osteoarthritis of knee 11/18/2016  . Female bladder prolapse 10/10/2016  . Long term current use of anticoagulant 10/10/2016  . Falls frequently 07/14/2016  . Syncope 07/14/2016  . Hyperlipidemia 06/10/2014  . Severe obesity (BMI >= 40) ([place]) 06/10/2014  . CAD (coronary artery disease)   . Interstitial cystitis 01/31/2013  . Chronic narcotic dependence ([place]) 01/30/2013  . S/P hip replacement 11/01/2012  . Venous insufficiency (chronic) (peripheral)   . Atherosclerosis of native arteries of the extremities with ulceration ([employer]) 07/21/2011  . Esophageal spasm 06/14/2011  . Hypothyroidism 01/19/2010  . LEG CRAMPS, NOCTURNAL 01/18/2010  . Chronic acquired lymphedema 01/18/2010  . History of  pulmonary embolism 08/10/2007  . 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 LOW BACK PAIN, CHRONIC 08/10/2007  . MEDIASTINAL ADENOPATHY [place] D 08/10/2007    Immunization History  Administered Date(s) Administered  . Fluad Quad(high Dose 65+) 05/24/2019, 05/26/2020  . Influenza Split 04/22/2011  . Influenza, High Dose Seasonal PF 04/17/2018  . Influenza,inj,Quad PF,6+ Mos 06/03/2013, 06/10/2014, 07/15/2016, 04/12/2017  . PFIZER(Purple Top)SARS-COV-2 Vaccination 09/14/2019, 10/05/2019  . Pneumococcal Conjugate-13 06/10/2014  . Pneumococcal Polysaccharide-23 05/24/2019  . Tdap 06/22/2012    Conditions to be addressed/monitored:  Hyperlipidemia, Coronary Artery Disease, GERD, Hypothyroidism and Osteoarthritis, history of PE, Chronic constipation  Care Plan : [place]  Updates made by Charlton Haws, [place] since 12/01/2020 12:00 AM    Problem: Hyperlipidemia, Coronary Artery Disease, GERD, Hypothyroidism and Osteoarthritis, history of PE, Chronic constipation   Priority: High    Long-Range Goal: Disease management   Start Date: 11/30/2020  Expected End Date: 06/02/2021  This Visit's Progress: On track  Priority: High  Note:   Current Barriers:  . Unable to independently afford treatment regimen . Unable to independently monitor therapeutic efficacy  Pharmacist Clinical Goal(s):  Marland Kitchen Patient will verbalize ability to afford treatment regimen . achieve adherence to monitoring guidelines and medication adherence to achieve therapeutic efficacy through collaboration with PharmD and provider.  Interventions: . 1:1 collaboration with Binnie Rail, MD regarding development and update of comprehensive plan of care as evidenced by provider attestation and co-signature . Inter-disciplinary care team collaboration (see longitudinal plan of care) . Comprehensive medication review performed; medication list updated in electronic medical record  Hyperlipidemia: (LDL  goal < 70) -Hx minimal CAD, PAD w/ venous insufficiency -Not ideally controlled - due to hx of atherosclerosis patient would benefit from statin therapy; she tried atorvastatin previously and had significant myalgias; she is not willing to start a different statin today but may consider in the future -Current treatment: no medication -Medications previously tried: atorvastatin (2012, 2014) -Educated on Cholesterol goals;  Benefits of statin for ASCVD risk reduction; Importance of limiting foods high in cholesterol; -Counseled on diet and exercise extensively - may consider hydrophillic statin in the future (pravastatin, rosuvastatin, pitavastatin)   History of PE Goal: prevent recurrent VTE) -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 Managed per PCP coumadin clinic - home INR machine. Lifelong anticoagulation. -Controlled - INR is at goal; pt denies s/sx of bleeding or clot -Current treatment  . Warfarin 5 mg - 1.5 tab daily except 1 tab Sun/Thurs -Medications previously tried: Lovenox -Counseled on potential drug interactions with warfarin - NSAIDs, Tylenol -Recommended to continue current medication  Hypothyroidism (Goal: maintain TSH in goal range) -Not ideally controlled  - most recent TSH was above goal, T4 dose was increased and pt did not return for repeat TSH -Pt takes levothyroxine at 4-5 am, goes back to bed and wakes up at 8-9 -Current treatment  . Levothyroxine 200 mcg daily . Levothyroxine 50 mcg daily  -Advised pt to get TSH checked (order is still active) this week -Recommend to continue current medication  Chronic pain (Goal: manage pain) -managed per Dr Marcell Barlow @ pain mgmt -bilateral osteoarthritis of knees; low back pain; pt reports 80% of pain comes from lower back -Controlled - per pt she does not use any other OTC pain meds due to interactions with warfarin; she does not use topicals because administration to low back is difficult -Current treatment  . Methadone 10 mg  - 2 tab q8h   (#180/month) . Hydromorphone 4 mg 1-2 tab q6h prn (#120/month) -Counseled on using medication as prescribed, advised not to take more than prescribed due to risk for respiratory depression/oversedation -Recommended to continue current medication  Constipation / GI upset (Goal: manage constipation) -Not ideally controlled - pt reports dicyclomine is not effective for GI spasms/pain; hyoscyamine worked well previously however copay is too high for patient -cologard positive - pt is planning to call GI to schedule colonoscopy -Current treatment  . Bisacodyl 5 mg QOD . Dulcolax stool softener 2 per day  . Dicyclomine 20 mg q6h PRN . Hyoscyamine 0.125 mg SL prn - NF -Medications previously tried: Linzess, Amitiza, Miralax, Movantik -Counseled on potential benefits of probiotic - recommended Align, culturelle or similar -Pursue tier exception for Hyoscyamine -Recommend to continue current medication   GERD (Goal: manage symptoms) -Not ideally controlled - pt reports she avoids tums due to drug interaction with levothyroxine; she reports most of her symtoms occur at night -Current treatment  . Tums PRN -Counseled on separating Tums from levothyroxine by at least 4 hours, but it can be used PRN for symptoms -Counseled on benefits of famotidine for PRN use as ewll -Recommended to use OTC famotidine at dinner/bedtime PRN  Rash (Goal: manage symptoms) -Controlled - patient reports currently rash is improving -tried corn starch, varying bathing patterns, listerine -Current treatment  .  Ciclopirox 0.77% cream PRN -Patient is satisfied with current regimen and denies issues -Recommended to continue current medication  Health Maintenance -Vaccine gaps: Shingrix -Has not been able to get upright walker. Shower seat did not fit in her bathroom. -Current therapy:  . Super B complex  -Patient is satisfied with current therapy and denies issues -Recommended to continue current medication    Patient Goals/Self-Care Activities . Patient will:  - take medications as prescribed focus on medication adherence by pill box  -Try famotidine (Pepcid) for heartburn / reflux -Try Probiotic (Align, Culturelle or similar) -Go to [facility] lab for repeat TSH -Schedule PCP visit in next 1-2 months  Follow Up Plan: Telephone follow up appointment with care management team member scheduled for: 3 months      Medication Assistance:  -Attempting tier exception for Hyoscyamine  Patient's preferred pharmacy is:  [id number] [place] [place]Phone: [phone]: [phone] Uses pill box? Yes Pt endorses 100% compliance  We discussed: Current pharmacy is preferred with insurance plan and patient is satisfied with pharmacy services Patient decided to: Continue current medication management strategy  Care Plan and Follow Up Patient Decision:  Patient agrees to Care Plan and Follow-up.  Plan: Telephone follow up appointment with care management team member scheduled for:  3 months  LCharlene Brooke PharmD, BStanford CPP Clinical Pharmacist [phone]

## 2020-12-01 NOTE — Patient Instructions (Addendum)
Visit Information  Phone number for Pharmacist: [phone]  Thank you for meeting with me to discuss your medications! I look forward to working with you to achieve your health care goals. Below is a summary of what we talked about during the visit:  Goals Addressed            This Visit's Progress   . Manage My Medicine       Timeframe:  Long-Range Goal Priority:  Medium Start Date:   11/30/20                          Expected End Date:    06/06/21                   Follow Up Date 03/06/21   - call for medicine refill 2 or 3 days before it runs out - call if I am sick and can't take my medicine - keep a list of all the medicines I take; vitamins and herbals too - use a pillbox to sort medicine  -Try famotidine (Pepcid) for heartburn / reflux -Try Probiotic (Align, Culturelle or similar) -Go to [facility] lab for repeat TSH -Schedule PCP visit in next 1-2 months   Why is this important?   . These steps will help you keep on track with your medicines.   Notes:       Patient Care Plan: CCM Pharmacy Care Plan    Problem Identified: Hyperlipidemia, Coronary Artery Disease, GERD, Hypothyroidism and Osteoarthritis, history of PE, Chronic constipation   Priority: High    Long-Range Goal: Disease management   Start Date: 11/30/2020  Expected End Date: 06/02/2021  This Visit's Progress: On track  Priority: High  Note:   Current Barriers:  . Unable to independently afford treatment regimen . Unable to independently monitor therapeutic efficacy  Pharmacist Clinical Goal(s):  Marland Kitchen Patient will verbalize ability to afford treatment regimen . achieve adherence to monitoring guidelines and medication adherence to achieve therapeutic efficacy through collaboration with PharmD and provider.   Interventions: . 1:1 collaboration with Binnie Rail, MD regarding development and update of comprehensive plan of care as evidenced by provider attestation and co-signature . Inter-disciplinary  care team collaboration (see longitudinal plan of care) . Comprehensive medication review performed; medication list updated in electronic medical record  Hyperlipidemia: (LDL goal < 70) -Hx minimal CAD, PAD w/ venous insufficiency -Not ideally controlled - due to hx of atherosclerosis patient would benefit from statin therapy; she tried atorvastatin previously and had significant myalgias; she is not willing to start a different statin today but may consider in the future -Current treatment: no medication -Medications previously tried: atorvastatin (2012, 2014) -Educated on Cholesterol goals;  Benefits of statin for ASCVD risk reduction; Importance of limiting foods high in cholesterol; -Counseled on diet and exercise extensively - may consider hydrophillic statin in the future (pravastatin, rosuvastatin, pitavastatin)   History of PE Goal: prevent recurrent VTE) -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 Managed per PCP coumadin clinic - home INR machine. Lifelong anticoagulation. -Controlled - INR is at goal; pt denies s/sx of bleeding or clot -Current treatment  . Warfarin 5 mg - 1.5 tab daily except 1 tab Sun/Thurs -Medications previously tried: Lovenox -Counseled on potential drug interactions with warfarin - NSAIDs, Tylenol -Recommended to continue current medication  Hypothyroidism (Goal: maintain TSH in goal range) -Not ideally controlled  - most recent TSH was above goal, T4 dose was increased and pt did not return for  repeat TSH -Pt takes levothyroxine at 4-5 am, goes back to bed and wakes up at 8-9 -Current treatment  . Levothyroxine 200 mcg daily . Levothyroxine 50 mcg daily  -Advised pt to get TSH checked (order is still active) this week -Recommend to continue current medication  Chronic pain (Goal: manage pain) -managed per Dr Marcell Barlow @ pain mgmt -bilateral osteoarthritis of knees; low back pain; pt reports 80% of pain comes from lower back -Controlled - per pt she does not use any  other OTC pain meds due to interactions with warfarin; she does not use topicals because administration to low back is difficult -Current treatment  . Methadone 10 mg  - 2 tab q8h  (#180/month) . Hydromorphone 4 mg 1-2 tab q6h prn (#120/month) -Counseled on using medication as prescribed, advised not to take more than prescribed due to risk for respiratory depression/oversedation -Recommended to continue current medication  Constipation / GI upset (Goal: manage constipation) -Not ideally controlled - pt reports dicyclomine is not effective for GI spasms/pain; hyoscyamine worked well previously however copay is too high for patient -cologard positive - pt is planning to call GI to schedule colonoscopy -Current treatment  . Bisacodyl 5 mg QOD . Dulcolax stool softener 2 per day  . Dicyclomine 20 mg q6h PRN . Hyoscyamine 0.125 mg SL prn - NF -Medications previously tried: Linzess, Amitiza, Miralax, Movantik -Counseled on potential benefits of probiotic - recommended Align, culturelle or similar -Pursue tier exception for Hyoscyamine -Recommend to continue current medication   GERD (Goal: manage symptoms) -Not ideally controlled - pt reports she avoids tums due to drug interaction with levothyroxine; she reports most of her symtoms occur at night -Current treatment  . Tums PRN -Counseled on separating Tums from levothyroxine by at least 4 hours, but it can be used PRN for symptoms -Counseled on benefits of famotidine for PRN use as ewll -Recommended to use OTC famotidine at dinner/bedtime PRN  Rash (Goal: manage symptoms) -Controlled - patient reports currently rash is improving -tried corn starch, varying bathing patterns, listerine -Current treatment  . Ciclopirox 0.77% cream PRN -Patient is satisfied with current regimen and denies issues -Recommended to continue current medication  Health Maintenance -Vaccine gaps: Shingrix -Has not been able to get upright walker. Shower seat  did not fit in her bathroom. -Current therapy:  . Super B complex  -Patient is satisfied with current therapy and denies issues -Recommended to continue current medication   Patient Goals/Self-Care Activities . Patient will:  - take medications as prescribed focus on medication adherence by pill box  -Try famotidine (Pepcid) for heartburn / reflux -Try Probiotic (Align, Culturelle or similar) -Go to [facility] lab for repeat TSH -Schedule PCP visit in next 1-2 months  Follow Up Plan: Telephone follow up appointment with care management team member scheduled for: 3 months      Ms. Eutsler was given information about Chronic Care Management services today including:  1. CCM service includes personalized support from designated clinical staff supervised by her physician, including individualized plan of care and coordination with other care providers 2. 24/7 contact phone numbers for assistance for urgent and routine care needs. 3. Standard insurance, coinsurance, copays and deductibles apply for chronic care management only during months in which we provide at least 20 minutes of these services. Most insurances cover these services at 100%, however patients may be responsible for any copay, coinsurance and/or deductible if applicable. This service may help you avoid the need for more expensive face-to-face services. 4.  Only one practitioner may furnish and bill the service in a calendar month. 5. The patient may stop CCM services at any time (effective at the end of the month) by phone call to the office staff.  Patient agreed to services and verbal consent obtained.   Patient verbalizes understanding of instructions provided today and agrees to view in [place].  Telephone follow up appointment with pharmacy team member scheduled for: 3 months  Charlene Brooke, PharmD, Para March, CPP Clinical Pharmacist [facility] [phone]  Heart-Healthy Eating Plan Many  factors influence your heart (coronary) health, including eating and exercise habits. Coronary risk increases with abnormal blood fat (lipid) levels. Heart-healthy meal planning includes limiting unhealthy fats, increasing healthy fats, and making other diet and lifestyle changes. What is my plan? Your health care provider may recommend that you:  Limit your fat intake to _________% or less of your total calories each day.  Limit your saturated fat intake to _________% or less of your total calories each day.  Limit the amount of cholesterol in your diet to less than _________ mg per day. What are tips for following this plan? Cooking Cook foods using methods other than frying. Baking, boiling, grilling, and broiling are all good options. Other ways to reduce fat include:  Removing the skin from poultry.  Removing all visible fats from meats.  Steaming vegetables in water or broth. Meal planning  At meals, imagine dividing your plate into fourths: ? Fill one-half of your plate with vegetables and green salads. ? Fill one-fourth of your plate with whole grains. ? Fill one-fourth of your plate with lean protein foods.  Eat 4-5 servings of vegetables per day. One serving equals 1 cup raw or cooked vegetable, or 2 cups raw leafy greens.  Eat 4-5 servings of fruit per day. One serving equals 1 medium whole fruit,  cup dried fruit,  cup fresh, frozen, or canned fruit, or  cup 100% fruit juice.  Eat more foods that contain soluble fiber. Examples include apples, broccoli, carrots, beans, peas, and barley. Aim to get 25-30 g of fiber per day.  Increase your consumption of legumes, nuts, and seeds to 4-5 servings per week. One serving of dried beans or legumes equals  cup cooked, 1 serving of nuts is  cup, and 1 serving of seeds equals 1 tablespoon.   Fats  Choose healthy fats more often. Choose monounsaturated and polyunsaturated fats, such as olive and canola oils, flaxseeds, walnuts,  almonds, and seeds.  Eat more omega-3 fats. Choose salmon, mackerel, sardines, tuna, flaxseed oil, and ground flaxseeds. Aim to eat fish at least 2 times each week.  Check food labels carefully to identify foods with trans fats or high amounts of saturated fat.  Limit saturated fats. These are found in animal products, such as meats, butter, and cream. Plant sources of saturated fats include palm oil, palm kernel oil, and coconut oil.  Avoid foods with partially hydrogenated oils in them. These contain trans fats. Examples are stick margarine, some tub margarines, cookies, crackers, and other baked goods.  Avoid fried foods. General information  Eat more home-cooked food and less restaurant, buffet, and fast food.  Limit or avoid alcohol.  Limit foods that are high in starch and sugar.  Lose weight if you are overweight. Losing just 5-10% of your body weight can help your overall health and prevent diseases such as diabetes and heart disease.  Monitor your salt (sodium) intake, especially if you have high blood  pressure. Talk with your health care provider about your sodium intake.  Try to incorporate more vegetarian meals weekly. What foods can I eat? Fruits All fresh, canned (in natural juice), or frozen fruits. Vegetables Fresh or frozen vegetables (raw, steamed, roasted, or grilled). Green salads. Grains Most grains. Choose whole wheat and whole grains most of the time. Rice and pasta, including brown rice and pastas made with whole wheat. Meats and other proteins Lean, well-trimmed beef, veal, pork, and lamb. Chicken and [place] without skin. All fish and shellfish. Wild duck, rabbit, pheasant, and venison. Egg whites or low-cholesterol egg substitutes. Dried beans, peas, lentils, and tofu. Seeds and most nuts. Dairy Low-fat or nonfat cheeses, including ricotta and mozzarella. Skim or 1% milk (liquid, powdered, or evaporated). Buttermilk made with low-fat milk. Nonfat or low-fat  yogurt. Fats and oils Non-hydrogenated (trans-free) margarines. Vegetable oils, including soybean, sesame, sunflower, olive, peanut, safflower, corn, canola, and cottonseed. Salad dressings or mayonnaise made with a vegetable oil. Beverages Water (mineral or sparkling). Coffee and tea. Diet carbonated beverages. Sweets and desserts Sherbet, gelatin, and fruit ice. Small amounts of dark chocolate. Limit all sweets and desserts. Seasonings and condiments All seasonings and condiments. The items listed above may not be a complete list of foods and beverages you can eat. Contact a dietitian for more options. What foods are not recommended? Fruits Canned fruit in heavy syrup. Fruit in cream or butter sauce. Fried fruit. Limit coconut. Vegetables Vegetables cooked in cheese, cream, or butter sauce. Fried vegetables. Grains Breads made with saturated or trans fats, oils, or whole milk. Croissants. Sweet rolls. Donuts. High-fat crackers, such as cheese crackers. Meats and other proteins Fatty meats, such as hot dogs, ribs, sausage, bacon, rib-eye roast or steak. High-fat deli meats, such as salami and bologna. Caviar. Domestic duck and goose. Organ meats, such as liver. Dairy Cream, sour cream, cream cheese, and creamed cottage cheese. Whole milk cheeses. Whole or 2% milk (liquid, evaporated, or condensed). Whole buttermilk. Cream sauce or high-fat cheese sauce. Whole-milk yogurt. Fats and oils Meat fat, or shortening. Cocoa butter, hydrogenated oils, palm oil, coconut oil, palm kernel oil. Solid fats and shortenings, including bacon fat, salt pork, lard, and butter. Nondairy cream substitutes. Salad dressings with cheese or sour cream. Beverages Regular sodas and any drinks with added sugar. Sweets and desserts Frosting. Pudding. Cookies. Cakes. Pies. Milk chocolate or white chocolate. Buttered syrups. Full-fat ice cream or ice cream drinks. The items listed above may not be a complete list of  foods and beverages to avoid. Contact a dietitian for more information. Summary  Heart-healthy meal planning includes limiting unhealthy fats, increasing healthy fats, and making other diet and lifestyle changes.  Lose weight if you are overweight. Losing just 5-10% of your body weight can help your overall health and prevent diseases such as diabetes and heart disease.  Focus on eating a balance of foods, including fruits and vegetables, low-fat or nonfat dairy, lean protein, nuts and legumes, whole grains, and heart-healthy oils and fats. This information is not intended to replace advice given to you by your health care provider. Make sure you discuss any questions you have with your health care provider. Document Revised: 09/01/2017 Document Reviewed: 09/01/2017 Elsevier Patient Education  2021 [employer].

## 2020-12-05 ENCOUNTER — Encounter: Payer: Self-pay | Admitting: Internal Medicine

## 2020-12-08 ENCOUNTER — Ambulatory Visit (INDEPENDENT_AMBULATORY_CARE_PROVIDER_SITE_OTHER): Payer: Medicare HMO | Admitting: General Practice

## 2020-12-08 DIAGNOSIS — Z7901 Long term (current) use of anticoagulants: Secondary | ICD-10-CM | POA: Diagnosis not present

## 2020-12-08 NOTE — Patient Instructions (Addendum)
Pre visit review using our clinic review tool, if applicable. No additional management support is needed unless otherwise documented below in the visit note.  Continue to take 1 1/2 tablets every day except take 1 on Sundays and Thursdays.  Re-check in 2 weeks.  Patient is using [employer] home monitoring. Dosing instruction LOVM.

## 2020-12-10 NOTE — Progress Notes (Signed)
Agree with management.  [name] Carmen Violetta Lavalle, MD  

## 2020-12-12 ENCOUNTER — Other Ambulatory Visit: Payer: Self-pay | Admitting: Internal Medicine

## 2020-12-17 ENCOUNTER — Telehealth: Payer: Self-pay | Admitting: Gastroenterology

## 2020-12-17 ENCOUNTER — Other Ambulatory Visit: Payer: Self-pay

## 2020-12-17 ENCOUNTER — Ambulatory Visit: Payer: Medicare HMO | Admitting: Podiatry

## 2020-12-17 DIAGNOSIS — Z86010 Personal history of colonic polyps: Secondary | ICD-10-CM

## 2020-12-17 NOTE — Telephone Encounter (Signed)
Inbound call from patient. Had colonoscopy 08/2017 at hospital. Believes she should be due this year, but the recall states 2024. Could you please verify? Best contact number [phone]

## 2020-12-17 NOTE — Telephone Encounter (Addendum)
Per 08/2017 pathology report, recall colonoscopy in 3 years. Patient is due for colonoscopy now at [facility].   Spoke with patient, she has been scheduled for a pre-visit on Thursday, 01/14/21 at 4:30 PM. Pre-screening COVID test is scheduled for Monday, 01/25/21 at 3:00 PM. Colonoscopy is scheduled at [facility] on Thursday, 01/28/21 at 9:30 AM, arriving at 8 AM with a care partner. Patient verbalized understanding of all information and had no concerns at the end of the call.  CASE ID: [id number]  Clearance request to hold Coumadin 5 days prior to colonoscopy sent to Dr. Billey Gosling.

## 2020-12-17 NOTE — Telephone Encounter (Signed)
Correction: Per 08/2019 office note, recall was adjusted to 5 years. Patient will be due for recall colonoscopy in 2024. No clearance request sent. All appointments have been cancelled and patient has been notified.

## 2020-12-22 LAB — POCT INR: INR: 2.1 (ref 2.0–3.0)

## 2021-01-05 ENCOUNTER — Ambulatory Visit (INDEPENDENT_AMBULATORY_CARE_PROVIDER_SITE_OTHER): Payer: Medicare HMO | Admitting: General Practice

## 2021-01-05 DIAGNOSIS — Z86711 Personal history of pulmonary embolism: Secondary | ICD-10-CM | POA: Diagnosis not present

## 2021-01-05 DIAGNOSIS — Z7901 Long term (current) use of anticoagulants: Secondary | ICD-10-CM | POA: Diagnosis not present

## 2021-01-05 LAB — POCT INR: INR: 2.1 (ref 2.0–3.0)

## 2021-01-05 NOTE — Patient Instructions (Signed)
Pre visit review using our clinic review tool, if applicable. No additional management support is needed unless otherwise documented below in the visit note.  Continue to take 1 1/2 tablets every day except take 1 on Sundays and Thursdays.  Re-check in 2 weeks.  Patient is using [employer] home monitoring. Dosing instruction LOVM.

## 2021-01-06 NOTE — Progress Notes (Signed)
Agree with management.  Lionell Matuszak J Cheri Ayotte, MD  

## 2021-01-19 ENCOUNTER — Ambulatory Visit (INDEPENDENT_AMBULATORY_CARE_PROVIDER_SITE_OTHER): Payer: Medicare HMO | Admitting: General Practice

## 2021-01-19 DIAGNOSIS — Z86711 Personal history of pulmonary embolism: Secondary | ICD-10-CM | POA: Diagnosis not present

## 2021-01-19 DIAGNOSIS — Z7901 Long term (current) use of anticoagulants: Secondary | ICD-10-CM

## 2021-01-19 DIAGNOSIS — Z86718 Personal history of other venous thrombosis and embolism: Secondary | ICD-10-CM | POA: Diagnosis not present

## 2021-01-19 LAB — POCT INR: INR: 2.7 (ref 2.0–3.0)

## 2021-01-19 NOTE — Patient Instructions (Addendum)
Pre visit review using our clinic review tool, if applicable. No additional management support is needed unless otherwise documented below in the visit note.  Continue to take 1 1/2 tablets every day except take 1 on Sundays and Thursdays.  Re-check in 2 weeks.  Patient is using [employer] home monitoring. Dosing instruction LOVM.

## 2021-01-20 NOTE — Progress Notes (Signed)
Agree with management.  [name] [name], MD  

## 2021-01-25 ENCOUNTER — Other Ambulatory Visit (HOSPITAL_COMMUNITY): Payer: Medicare HMO

## 2021-01-28 ENCOUNTER — Encounter (HOSPITAL_COMMUNITY): Payer: Self-pay

## 2021-01-28 ENCOUNTER — Ambulatory Visit (HOSPITAL_COMMUNITY): Admit: 2021-01-28 | Payer: Medicare HMO | Admitting: Gastroenterology

## 2021-01-28 SURGERY — COLONOSCOPY WITH PROPOFOL
Anesthesia: Monitor Anesthesia Care

## 2021-02-02 ENCOUNTER — Ambulatory Visit (INDEPENDENT_AMBULATORY_CARE_PROVIDER_SITE_OTHER): Payer: Medicare HMO | Admitting: General Practice

## 2021-02-02 DIAGNOSIS — Z7901 Long term (current) use of anticoagulants: Secondary | ICD-10-CM | POA: Diagnosis not present

## 2021-02-02 DIAGNOSIS — Z86711 Personal history of pulmonary embolism: Secondary | ICD-10-CM

## 2021-02-02 LAB — POCT INR: INR: 2 (ref 2.0–3.0)

## 2021-02-02 NOTE — Patient Instructions (Signed)
Pre visit review using our clinic review tool, if applicable. No additional management support is needed unless otherwise documented below in the visit note.  Continue to take 1 1/2 tablets every day except take 1 on Sundays and Thursdays.  Re-check in 2 weeks.  Patient is using [employer] home monitoring. Dosing instruction LOVM.

## 2021-02-03 NOTE — Progress Notes (Signed)
Agree with management.  Bettyjo Lundblad J Athen Riel, MD  

## 2021-02-09 ENCOUNTER — Other Ambulatory Visit: Payer: Self-pay

## 2021-02-09 ENCOUNTER — Ambulatory Visit (INDEPENDENT_AMBULATORY_CARE_PROVIDER_SITE_OTHER): Payer: Medicare HMO | Admitting: Pharmacist

## 2021-02-09 DIAGNOSIS — Z86711 Personal history of pulmonary embolism: Secondary | ICD-10-CM

## 2021-02-09 DIAGNOSIS — E039 Hypothyroidism, unspecified: Secondary | ICD-10-CM

## 2021-02-09 DIAGNOSIS — E782 Mixed hyperlipidemia: Secondary | ICD-10-CM

## 2021-02-09 DIAGNOSIS — I2510 Atherosclerotic heart disease of native coronary artery without angina pectoris: Secondary | ICD-10-CM | POA: Diagnosis not present

## 2021-02-09 DIAGNOSIS — K219 Gastro-esophageal reflux disease without esophagitis: Secondary | ICD-10-CM

## 2021-02-09 DIAGNOSIS — M545 Low back pain, unspecified: Secondary | ICD-10-CM

## 2021-02-09 DIAGNOSIS — G8929 Other chronic pain: Secondary | ICD-10-CM

## 2021-02-09 NOTE — Progress Notes (Signed)
Chronic Care Management Pharmacy Note  02/11/2021 Name:  [name] MRN:  [id number] DOB:  May 28, 1956  Summary: -GERD/GI symptoms improved with rabeprazole (prescribed by neurologist); no longer needing dicyclomine -Pt c/o recurring rash on bottom for past 6+ months, currently active again -Pt never came back for repeat TSH after 9.52 level and T4 increase -Pt is overdue for DEXA scan, mammogram, colonoscopy (cologard was positive in 2018)  Recommendations/Changes made from today's visit: -Advised patient to f/u with PCP regarding rash - visit scheduled 7/29 -Advised patient TSH order is still active and she should go to [place] lab prior to PCP visit    Subjective: [name] is an 65 y.o. year old female who is a primary patient of Burns, Claudina Lick, MD.  The CCM team was consulted for assistance with disease management and care coordination needs.    Engaged with patient by telephone for follow up visit in response to provider referral for pharmacy case management and/or care coordination services.   Consent to Services:  The patient was given information about Chronic Care Management services, agreed to services, and gave verbal consent prior to initiation of services.  Please see initial visit note for detailed documentation.   Patient Care Team: Binnie Rail, MD as PCP - General (Internal Medicine) Wayland Salinas, MD as Referring Physician (Neurosurgery) Newt Minion, MD as Consulting Physician (Orthopedic Surgery) Myrlene Broker, MD as Attending Physician (Urology) Loletha Carrow Kirke Corin, MD as Consulting Physician (Gastroenterology) Charlton Haws, [facility] as Pharmacist (Pharmacist)   Patient lives alone, she does not drive anymore. Her daughter passed away recently. She uses uber/lyft to get around or she has 2 friends who can drive her places occasionally.   "If they don't deliver it, I don't eat it"; she uses grocery delivery from [employer].  Has several  grandchildren, local and in [place].  Recent office visits: 09/25/20 pt message: rx'd dicyclomine d/t hyoscyamine not covered.  05/26/20 Dr Quay Burow OV: chronic f/u. C/o oozing rash on torso. Rec'd platform walker. Referred to urogyn for bladder prolapse. Stopped nystatin powder and rx'd ciclopirox cream. Want to avoid diflucan d/t methadone and warfarin. Increased levothyroxine to 250 mcg daily. Ordered TSH for 2 mos but pt did not go.  Recent consult visits: 06/08/20 Wannetta Sender (GYN/Urology) - Seen for Urge incontinence, patient started Myrbetriq 25 mg daily, instructed to  f/u 6 wks   06/15/20 McDonald (Podiatry) - Seen for Hammertoe, no changes, instructed to  f/u 3 mos   09/17/20 McDonald (Podiatry) - Seen for Hammertoe, no changes  Hospital visits: None in previous 6 months  Objective:  Lab Results  Component Value Date   CREATININE 0.70 05/26/2020   BUN 7 05/26/2020   GFR 90.00 05/26/2020   GFRNONAA >60 07/14/2016   GFRAA >60 07/14/2016   NA 139 05/26/2020   K 3.6 05/26/2020   CALCIUM 8.9 05/26/2020   CO2 34 (H) 05/26/2020   GLUCOSE 90 05/26/2020    Lab Results  Component Value Date/Time   HGBA1C 5.5 04/17/2018 02:29 PM   HGBA1C 5.5 10/10/2017 03:06 PM   GFR 90.00 05/26/2020 01:58 PM   GFR 83.74 11/25/2019 02:56 PM    Last diabetic Eye exam: No results found for: HMDIABEYEEXA  Last diabetic Foot exam: No results found for: HMDIABFOOTEX   Lab Results  Component Value Date   CHOL 169 05/24/2019   HDL 32.50 (L) 05/24/2019   LDLCALC 69 04/17/2018   LDLDIRECT 105.0 05/24/2019   TRIG 212.0 (H) 05/24/2019  CHOLHDL 5 05/24/2019    Hepatic Function Latest Ref Rng & Units 05/26/2020 11/25/2019 05/24/2019  Total Protein 6.0 - 8.3 g/dL 7.6 7.4 8.0  Albumin 3.5 - 5.2 g/dL 3.7 3.7 4.0  AST 0 - 37 U/L '26 19 24  ' ALT 0 - 35 U/L '18 11 17  ' Alk Phosphatase 39 - 117 U/L 81 71 97  Total Bilirubin 0.2 - 1.2 mg/dL 0.6 0.6 0.5  Bilirubin, Direct 0.0 - 0.3 mg/dL - - -    Lab  Results  Component Value Date/Time   TSH 9.52 (H) 05/26/2020 01:58 PM   TSH 3.33 11/25/2019 02:56 PM   FREET4 0.56 (L) 07/13/2016 09:24 PM   FREET4 0.69 07/24/2014 10:14 AM    CBC Latest Ref Rng & Units 05/26/2020 11/25/2019 05/24/2019  WBC 4.0 - 10.5 K/uL 5.5 4.5 5.6  Hemoglobin 12.0 - 15.0 g/dL 12.5 12.6 13.6  Hematocrit 36.0 - 46.0 % 38.0 38.4 40.8  Platelets 150.0 - 400.0 K/uL 161.0 170.0 182.0    No results found for: VD25OH  Clinical ASCVD: Yes  The 10-year ASCVD risk score Mikey Bussing DC Jr., et al., 2013) is: 5.9%   Values used to calculate the score:     Age: 72 years     Sex: Female     Is Non-Hispanic African American: No     Diabetic: No     Tobacco smoker: No     Systolic Blood Pressure: 768 mmHg     Is BP treated: No     HDL Cholesterol: 32.5 mg/dL     Total Cholesterol: 169 mg/dL    Depression screen [facility] 2/9 09/29/2020 05/26/2020 04/17/2018  Decreased Interest 0 1 2  Down, Depressed, Hopeless 0 0 2  PHQ - 2 Score 0 1 4  Altered sleeping - 1 0  Tired, decreased energy - 1 2  Change in appetite - 0 1  Feeling bad or failure about yourself  - 0 1  Trouble concentrating - 0 0  Moving slowly or fidgety/restless - 0 0  Suicidal thoughts - 0 0  PHQ-9 Score - 3 8  Difficult doing work/chores - Somewhat difficult Not difficult at all  Some recent data might be hidden    Lab Results  Component Value Date/Time   INR 2.0 02/02/2021 12:00 AM   INR 2.7 01/19/2021 12:00 AM   INR 2.4 (H) 10/10/2016 04:10 PM   INR 4.7 (H) 08/11/2016 11:57 AM   INR 2.1 09/30/2010 03:50 PM   INR 3.8 08/17/2010 02:12 PM    Social History   Tobacco Use  Smoking Status Former   Packs/day: 0.10   Years: 27.00   Pack years: 2.70   Types: Cigarettes   Quit date: 06/09/2011   Years since quitting: 9.6  Smokeless Tobacco Never   BP Readings from Last 3 Encounters:  06/08/20 118/76  05/26/20 114/76  11/25/19 122/72   Pulse Readings from Last 3 Encounters:  05/26/20 80  11/25/19 67   08/23/19 84   Wt Readings from Last 3 Encounters:  06/08/20 (!) 315 lb (142.9 kg)  08/23/19 (!) 331 lb 3.2 oz (150.2 kg)  06/24/19 (!) 323 lb 3.2 oz (146.6 kg)   BMI Readings from Last 3 Encounters:  06/08/20 52.42 kg/m  05/26/20 55.11 kg/m  11/25/19 55.11 kg/m    Assessment/Interventions: Review of patient past medical history, allergies, medications, health status, including review of consultants reports, laboratory and other test data, was performed as part of comprehensive evaluation and provision of chronic  care management services.   SDOH:  (Social Determinants of Health) assessments and interventions performed: Yes  SDOH Screenings   Alcohol Screen: Low Risk    Last Alcohol Screening Score (AUDIT): 0  Depression (PHQ2-9): Low Risk    PHQ-2 Score: 0  Financial Resource Strain: Low Risk    Difficulty of Paying Living Expenses: Not hard at all  Food Insecurity: No Food Insecurity   Worried About Charity fundraiser in the Last Year: Never true   Ran Out of Food in the Last Year: Never true  Housing: Low Risk    Last Housing Risk Score: 0  Physical Activity: Inactive   Days of Exercise per Week: 0 days   Minutes of Exercise per Session: 0 min  Social Connections: Socially Isolated   Frequency of Communication with Friends and Family: More than three times a week   Frequency of Social Gatherings with Friends and Family: More than three times a week   Attends Religious Services: Never   Marine scientist or Organizations: No   Attends Music therapist: Never   Marital Status: Never married  Stress: No Stress Concern Present   Feeling of Stress : Not at all  Tobacco Use: Medium Risk   Smoking Tobacco Use: Former   Smokeless Tobacco Use: Never  Transportation Needs: No Data processing manager (Medical): No   Lack of Transportation (Non-Medical): No    CCM Care Plan  Allergies  Allergen Reactions   Amitiza [Lubiprostone]  Diarrhea and Nausea And Vomiting   Morphine Anaphylaxis    REACTION: nausea, rash, itching, vomiting   Narcan [Naloxone Hcl] Other (See Comments)    Pt was not her self   Nsaids     Bowel irregularity (coffee grounds)   Venlafaxine Other (See Comments)    itching    Medications Reviewed Today     Reviewed by Charlton Haws, [facility] (Pharmacist) on 02/09/21 at [place] List Status: <None>   Medication Order Taking? Sig Documenting Provider Last Dose Status Informant  bisacodyl (DULCOLAX) 5 MG EC tablet [id number] Yes Take 30 mg by mouth daily as needed for moderate constipation. [provider] Taking Active Self  calcium carbonate (TUMS - DOSED IN MG ELEMENTAL CALCIUM) 500 MG chewable tablet [id number] Yes Chew 1 tablet by mouth as needed for indigestion or heartburn. [provider] Taking Active Self           Med Note Luna Glasgow Nov 30, 2020  3:29 PM) Separate from levothyroxine by at least 4 hours  ciclopirox (LOPROX) 0.77 % cream [id number] Yes Apply topically 2 (two) times daily. Binnie Rail, MD Taking Active   HYDROmorphone (DILAUDID) 4 MG tablet [id number] Yes TAKE 1 TO 2 TABLETS BY MOUTH EVERY 6 HOURS AS NEEDED FOR [facility] PAIN [provider] Taking Active Self           Med Note (DAVIS, SOPHIA A   Thu Jun 15, 2017  2:47 PM)    levothyroxine (SYNTHROID) 200 MCG tablet [id number] Yes TAKE 1 TABLET BY MOUTH DAILY BEFORE BREAKFAST. Binnie Rail, MD Taking Active   levothyroxine (SYNTHROID) 50 MCG tablet [id number] Yes Take 1 tablet (50 mcg total) by mouth daily before breakfast. Take in addition to 200 mcg pill for a total of 250 mcg daily Burns, Claudina Lick, MD Taking Active   methadone (DOLOPHINE) 10 MG tablet [id number] Yes Take 10 mg by mouth every 8 (eight)  hours as needed. for pain [provider] Taking Active Self           Med Note Rosemarie Beath, MELISSA B   Wed Aug 02, 2017  6:37 PM)    Probiotic Product (PROBIOTIC DAILY PO)  [id number] Yes Take by mouth. [provider] Taking Active   RABEprazole (ACIPHEX) 20 MG tablet [id number] Yes Take 20 mg by mouth daily. [provider] Taking Active   vitamin B-12 (CYANOCOBALAMIN) 1000 MCG tablet [id number] Yes Take 1 tablet (1,000 mcg total) by mouth daily. Binnie Rail, MD Taking Active   warfarin (COUMADIN) 5 MG tablet [id number] Yes Take 1 1/2 tablets daily except take 1 tablet on Sun and Thursday or Take as directed by anticoagulation clinic Binnie Rail, MD Taking Active             Patient Active Problem List   Diagnosis Date Noted   Tinea corporis 05/26/2020   Lesion of tongue 11/25/2019   Left upper arm pain 10/16/2018   Mixed incontinence 11/10/2017   B12 deficiency 10/12/2017   Microscopic hematuria 10/12/2017   Bilateral primary osteoarthritis of knee 11/18/2016   Female bladder prolapse 10/10/2016   Long term current use of anticoagulant 10/10/2016   Falls frequently 07/14/2016   Syncope 07/14/2016   Hyperlipidemia 06/10/2014   Severe obesity (BMI >= 40) ([place]) 06/10/2014   CAD (coronary artery disease)    Interstitial cystitis 01/31/2013   Chronic narcotic dependence ([place]) 01/30/2013   S/P hip replacement 11/01/2012   Venous insufficiency (chronic) (peripheral)    Atherosclerosis of native arteries of the extremities with ulceration ([place]) 07/21/2011   Esophageal spasm 06/14/2011   Hypothyroidism 01/19/2010   LEG CRAMPS, NOCTURNAL 01/18/2010   Chronic acquired lymphedema 01/18/2010   History of pulmonary embolism 08/10/2007   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/10/2007   LOW BACK PAIN, CHRONIC 08/10/2007   MEDIASTINAL ADENOPATHY CASTLEMANS D 08/10/2007    Immunization History  Administered Date(s) Administered   Fluad Quad(high Dose 65+) 05/24/2019, 05/26/2020   Influenza Split 04/22/2011   Influenza, High Dose Seasonal PF 04/17/2018   Influenza,inj,Quad PF,6+ Mos 06/03/2013, 06/10/2014, 07/15/2016, 04/12/2017   PFIZER(Purple  Top)SARS-COV-2 Vaccination 09/14/2019, 10/05/2019   Pneumococcal Conjugate-13 06/10/2014   Pneumococcal Polysaccharide-23 05/24/2019   Tdap 06/22/2012   Unspecified SARS-COV-2 Vaccination 07/28/2020    Conditions to be addressed/monitored:  Hyperlipidemia, Coronary Artery Disease, GERD, Hypothyroidism and Osteoarthritis, history of PE, Chronic constipation  Care Plan : [place]  Updates made by Charlton Haws, [place] since 02/11/2021 12:00 AM     Problem: Hyperlipidemia, Coronary Artery Disease, GERD, Hypothyroidism and Osteoarthritis, history of PE, Chronic constipation   Priority: High     Long-Range Goal: Disease management   Start Date: 11/30/2020  Expected End Date: 06/02/2021  Recent Progress: On track  Priority: High  Note:   Current Barriers:  Unable to independently monitor therapeutic efficacy Suboptimal therapeutic regimen for HLD  Pharmacist Clinical Goal(s):  Patient will verbalize ability to afford treatment regimen achieve adherence to monitoring guidelines and medication adherence to achieve therapeutic efficacy through collaboration with PharmD and provider.   Interventions: 1:1 collaboration with Binnie Rail, MD regarding development and update of comprehensive plan of care as evidenced by provider attestation and co-signature Inter-disciplinary care team collaboration (see longitudinal plan of care) Comprehensive medication review performed; medication list updated in electronic medical record  Hyperlipidemia: (LDL goal < 70) -Hx minimal CAD, PAD w/ venous insufficiency -Not ideally controlled - due to hx of atherosclerosis  patient would benefit from statin therapy; she tried atorvastatin previously and had significant myalgias; she is not willing to start a different statin today but may consider in the future -Current treatment: no medication -Medications previously tried: atorvastatin (2012, 2014) -Educated on Cholesterol goals;  Benefits of statin for ASCVD risk reduction; Importance of limiting foods high in cholesterol; -Counseled on diet and exercise extensively - may consider hydrophillic statin in the future (pravastatin, rosuvastatin, pitavastatin)   History of PE Goal: prevent recurrent VTE) -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 Managed per PCP coumadin clinic - home INR machine. Lifelong anticoagulation. -Controlled - INR is at goal; pt denies s/sx of bleeding or clot -Current treatment  Warfarin 5 mg - 1.5 tab daily except 1 tab Sun/Thurs -Medications previously tried: Lovenox -Counseled on potential drug interactions with warfarin - NSAIDs, Tylenol -Recommended to continue current medication  Hypothyroidism (Goal: maintain TSH in goal range) -Not ideally controlled  - most recent TSH was above goal, T4 dose was increased and pt did not return for repeat TSH -Pt takes levothyroxine at 4-5 am, goes back to bed and wakes up at 8-9 -Current treatment  Levothyroxine 200 mcg daily Levothyroxine 50 mcg daily  -Advised pt to get TSH checked (order is still active) prior to PCP follow up visit later this month  Chronic pain (Goal: manage pain) -managed per Dr Marcell Barlow @ pain mgmt -bilateral osteoarthritis of knees; low back pain; pt reports 80% of pain comes from lower back -Controlled - per pt she does not use any other OTC pain meds due to interactions with warfarin; she does not use topicals because administration to low back is difficult -Current treatment  Methadone 10 mg  - 2 tab q8h  (#180/month) Hydromorphone 4 mg 1-2 tab q6h prn (#120/month) -Counseled on using medication as prescribed, advised not to take more than prescribed due to risk for respiratory depression/oversedation -Recommended to continue current medication  Constipation / GI upset (Goal: manage constipation) -Controlled - pt reports symptoms are well controlled after starting rabeprazole; she is no longer using dicyclomine -cologard positive - pt is  planning to call GI to schedule colonoscopy -Current treatment  Bisacodyl 5 mg QOD Dulcolax stool softener 2 per day  Dicyclomine 20 mg q6h PRN - not taking -Medications previously tried: Linzess, Amitiza, Miralax, Movantik -Removed dicyclomine from med list -Recommend to continue current medication   GERD (Goal: manage symptoms) -Controlled - pt reports symptoms are much improved after starting rabeprazole; she reports her neurologist prescribed this for her when she mentioned stomach issues -Current treatment  Tums PRN Rabeprazole 20 mg daily -Counseled on separating Tums from levothyroxine by at least 4 hours, but it can be used PRN for symptoms -Recommend to continue current medication  Rash (Goal: manage symptoms) -Uncontrolled - patient reports rash on bottom has been on and off for over 6 months, currently it is active -tried corn starch, varying bathing patterns, listerine -Current treatment  Ciclopirox 0.77% cream PRN -Advised f/u with PCP to address rash  Health Maintenance -Vaccine gaps: Shingrix, covid booster -DEXA scan is overdue (never done) -Has not been able to get upright walker. Shower seat did not fit in her bathroom. -Advised patient get Shingrix, covid booster  Patient Goals/Self-Care Activities Patient will:  - take medications as prescribed -focus on medication adherence by pill box  -Go to [place] lab for repeat TSH -Keep PCP visit 03/05/21 for rash -Schedule DEXA scan, colonoscopy, mammogram -Get Shingrix vaccine and covid booster    Medication Assistance:  None  needed  Compliance/Adherence/Medication fill history: Care Gaps: Shingrix Covid booster (due 11/26/20) Mammogram (due 08/11/18) DEXA scan (never done) Cologuard (due 06/01/20)  Star-Rating Drugs: None  Patient's preferred pharmacy is:  [id number] [place], [place] [id number]:  [phone]: [phone] Uses pill box? Yes Pt endorses 100% compliance  We discussed: Current pharmacy is preferred with insurance plan and patient is satisfied with pharmacy services Patient decided to: Continue current medication management strategy  Care Plan and Follow Up Patient Decision:  Patient agrees to Care Plan and Follow-up.  Plan: Telephone follow up appointment with care management team member scheduled for:  3 months  LCharlene Brooke PharmD, [place]aj[place] CPP Clinical Pharmacist [phone]

## 2021-02-11 NOTE — Patient Instructions (Signed)
Visit Information  Phone number for Pharmacist: [phone]   Goals Addressed             This Visit's Progress    Manage My Medicine       Timeframe:  Long-Range Goal Priority:  Medium Start Date:   11/30/20                          Expected End Date:    06/06/21                   Follow Up Date 03/06/21   - call for medicine refill 2 or 3 days before it runs out - call if I am sick and can't take my medicine - keep a list of all the medicines I take; vitamins and herbals too - use a pillbox to sort medicine  -Go to [place] lab for repeat TSH -Keep PCP appt 7/29 for rash -Schedule mammogram, DEXA scan, colonoscopy -Get Shingrix vaccine and covid booster   Why is this important?   These steps will help you keep on track with your medicines.   Notes:          Patient verbalizes understanding of instructions provided today and agrees to view in [place].  Telephone follow up appointment with pharmacy team member scheduled for: 3 months  Charlene Brooke, PharmD, [place], CPP Clinical Pharmacist [facility] [phone]

## 2021-02-16 ENCOUNTER — Ambulatory Visit (INDEPENDENT_AMBULATORY_CARE_PROVIDER_SITE_OTHER): Payer: Medicare HMO | Admitting: General Practice

## 2021-02-16 DIAGNOSIS — Z7901 Long term (current) use of anticoagulants: Secondary | ICD-10-CM

## 2021-02-16 DIAGNOSIS — Z86711 Personal history of pulmonary embolism: Secondary | ICD-10-CM

## 2021-02-16 LAB — POCT INR: INR: 2.2 (ref 2.0–3.0)

## 2021-02-16 NOTE — Patient Instructions (Signed)
Pre visit review using our clinic review tool, if applicable. No additional management support is needed unless otherwise documented below in the visit note. Continue to take 1 1/2 tablets every day except take 1 on Sundays and Thursdays.  Re-check in 2 weeks.  Patient is using [employer] home monitoring. Dosing instruction LOVM.

## 2021-02-17 NOTE — Progress Notes (Signed)
Agree with management.  [name] J Nekesha Font, MD  

## 2021-03-02 ENCOUNTER — Ambulatory Visit (INDEPENDENT_AMBULATORY_CARE_PROVIDER_SITE_OTHER): Payer: Medicare HMO | Admitting: General Practice

## 2021-03-02 DIAGNOSIS — Z86711 Personal history of pulmonary embolism: Secondary | ICD-10-CM | POA: Diagnosis not present

## 2021-03-02 DIAGNOSIS — Z7901 Long term (current) use of anticoagulants: Secondary | ICD-10-CM

## 2021-03-02 LAB — POCT INR: INR: 2 (ref 2.0–3.0)

## 2021-03-02 NOTE — Patient Instructions (Signed)
Pre visit review using our clinic review tool, if applicable. No additional management support is needed unless otherwise documented below in the visit note.  Continue to take 1 1/2 tablets every day except take 1 on Sundays and Thursdays.  Re-check in 2 weeks.  Patient is using [employer] home monitoring. Dosing instruction LOVM.

## 2021-03-03 NOTE — Progress Notes (Signed)
Agree with management.  Hanako Tipping J Erin Uecker, MD  

## 2021-03-04 ENCOUNTER — Other Ambulatory Visit: Payer: Self-pay

## 2021-03-04 NOTE — Patient Instructions (Addendum)
Look into motorized wheelchairs and make an appointment for that.       Preventing Pressure Injuries  A pressure injury, sometimes called a bedsore or a pressure ulcer, is an injury to the skin and underlying tissue caused by pressure. A pressure injury can happen when your skin presses against a surface, such as a mattress or wheelchair seat, for too long. The pressure on the blood vessels causes reduced blood flow to your skin. This can eventually cause the skin tissue to die andbreak down into a wound. Pressure injuries usually develop: Over bony parts of the body, such as the tailbone, shoulders, elbows, hips, and heels. Under medical devices, such as respiratory equipment, stockings, tubes, and splints. How can this condition affect me? Pressure injuries are caused by a lack of blood supply to an area of skin. These injuries begin as a reddened area on the skin and can become an open sore. They can result from intense pressure over a short period of time or fromless pressure over a long period of time. Pressure injuries can vary in severity. They can cause pain, muscle damage, andinfection. What can increase my risk? This condition is more likely to develop in people who: Are in the hospital or an extended care facility. Are bedridden or in a wheelchair. Have an injury or disease that keeps them from: Moving normally. Feeling pain or pressure. Communicating if they feel pain or pressure. Have a condition that: Makes them sleepy or less alert. Causes poor blood flow. Need to wear a medical device. Have poor control of their bladder or bowel functions (incontinence). Have poor nutrition (malnutrition). Have had this condition before. Are of certain ethnicities. People of African American, Latino, or Hispanic descent are at higher risk compared to other ethnic groups. What actions can I take to prevent pressure injuries? Reducing and redistributing pressure Do not lie or sit in  one position for a long time. Move or change position: Every hour when out of bed in a chair. Every two hours when in bed. As often as told by your health care provider. Use pillows, wedges, or cushions to redistribute pressure. Ask your health care provider to recommend a mattress, cushions, or pads for you. Use medical devices that do not rub your skin. Tell your health care provider if one of your medical devices is causing pain or irritation. Skin care If you are in the hospital, your health care providers: Will inspect your skin, including areas under or around medical devices, at least twice a day. May recommend that you use certain types of bedding to help prevent pressure injuries. These may include a pad, mattress, or chair cushion that is filled with gel, air, water, or foam. Will evaluate your nutrition and consult a dietitian if needed. Will inspect and change any wound dressings regularly. May help you move into different positions every few hours. Will adjust any medical devices and braces as needed to limit pressure on your skin. Will keep your skin clean and dry. May use gentle cleansers and skin protectants if you are incontinent. Will moisturize any dry skin. In general, at home: Keep your skin clean and dry. Gently pat your skin dry. Do not rub or massage bony areas of your skin. Moisturize dry skin. Use gentle cleansers and skin protectants routinely if you are incontinent. Check your skin at least once a day for any changes in color and for any new blisters or sores. Make sure to check under and around any medical  devices and between skin folds. Have a caregiver do this for you if you are not able.  Lifestyle Be as active as you can every day. Ask your health care provider to suggest safe exercises or activities. Do not abuse drugs or alcohol. Do not use any products that contain nicotine or tobacco, such as cigarettes, e-cigarettes, and chewing tobacco. If you need  help quitting, ask your health care provider. General instructions  Take over-the-counter and prescription medicines only as told by your health care provider. Work with your health care provider to manage any chronic health conditions. Eat a healthy diet that includes protein, vitamins, and minerals. Ask your health care provider what types of food you should eat. Drink enough fluid to keep your urine pale yellow. Keep all follow-up visits as told by your health care provider. This is important.  Contact a health care provider if you: Feel or see any changes in your skin. Summary A pressure injury, sometimes called a bedsore or a pressure ulcer, is an injury to the skin and underlying tissue caused by pressure. Do not lie or sit in one position for a long time. Check your skin at least once a day for any changes in color and for any new blisters or sores. Make sure to check under and around any medical devices and between skin folds. Have a caregiver do this for you if you are not able. Eat a healthy diet that includes protein, vitamins, and minerals. Ask your health care provider what types of food you should eat. This information is not intended to replace advice given to you by your health care provider. Make sure you discuss any questions you have with your healthcare provider. Document Revised: 11/16/2018 Document Reviewed: 04/17/2018 Elsevier Patient Education  [employer].

## 2021-03-04 NOTE — Progress Notes (Signed)
Subjective:    Patient ID: [name], female    DOB: 12/25/1955, 65 y.o.   MRN: [id number]  HPI The patient is here for follow up of their chronic medical problems, including hypothyroidism, LE edema d/t venous insuff, h/o PE on warfarin, B12 def, COPD, severe b/l knee OA, chronic back pain ( pain management)  She saw her neurosurgeon and needs a chair - motorized wheelchair.  She is not a surgical candidate.    Her leg swelling is awful.    She has a rash on her back - sometimes it Liyah Higham.  It started months ago and it is intermittent.  It came back 4 days ago.  It does not itch.  Sitting up in the bed and leaning against the bed aggravates it.  A cold wet cloth helps.     Medications and allergies reviewed with patient and updated if appropriate.  Patient Active Problem List   Diagnosis Date Noted   Tinea corporis 05/26/2020   Lesion of tongue 11/25/2019   Left upper arm pain 10/16/2018   Mixed incontinence 11/10/2017   B12 deficiency 10/12/2017   Microscopic hematuria 10/12/2017   Bilateral primary osteoarthritis of knee 11/18/2016   Female bladder prolapse 10/10/2016   Long term current use of anticoagulant 10/10/2016   Falls frequently 07/14/2016   Syncope 07/14/2016   Hyperlipidemia 06/10/2014   Severe obesity (BMI >= 40) (HCC) 06/10/2014   CAD (coronary artery disease)    Interstitial cystitis 01/31/2013   Chronic narcotic dependence (HCC) 01/30/2013   S/P hip replacement 11/01/2012   Venous insufficiency (chronic) (peripheral)    Atherosclerosis of native arteries of the extremities with ulceration (HCC) 07/21/2011   Esophageal spasm 06/14/2011   Hypothyroidism 01/19/2010   LEG CRAMPS, NOCTURNAL 01/18/2010   Chronic acquired lymphedema 01/18/2010   History of pulmonary embolism 08/10/2007   COPD with emphysema (HCC) 08/10/2007   LOW BACK PAIN, CHRONIC 08/10/2007   MEDIASTINAL ADENOPATHY CASTLEMANS D 08/10/2007    Current Outpatient Medications on File  Prior to Visit  Medication Sig Dispense Refill   bisacodyl (DULCOLAX) 5 MG EC tablet Take 30 mg by mouth daily as needed for moderate constipation.     calcium carbonate (TUMS - DOSED IN MG ELEMENTAL CALCIUM) 500 MG chewable tablet Chew 1 tablet by mouth as needed for indigestion or heartburn.     ciclopirox (LOPROX) 0.77 % cream Apply topically 2 (two) times daily. 90 g 2   HYDROmorphone (DILAUDID) 4 MG tablet TAKE 1 TO 2 TABLETS BY MOUTH EVERY 6 HOURS AS NEEDED FOR BREAKTHROUGH PAIN  0   levothyroxine (SYNTHROID) 200 MCG tablet TAKE 1 TABLET BY MOUTH DAILY BEFORE BREAKFAST. 90 tablet 0   levothyroxine (SYNTHROID) 50 MCG tablet Take 1 tablet (50 mcg total) by mouth daily before breakfast. Take in addition to 200 mcg pill for a total of 250 mcg daily 90 tablet 3   methadone (DOLOPHINE) 10 MG tablet Take 10 mg by mouth every 8 (eight) hours as needed. for pain  0   Probiotic Product (PROBIOTIC DAILY PO) Take by mouth.     RABEprazole (ACIPHEX) 20 MG tablet Take 20 mg by mouth daily.     vitamin B-12 (CYANOCOBALAMIN) 1000 MCG tablet Take 1 tablet (1,000 mcg total) by mouth daily.     warfarin (COUMADIN) 5 MG tablet Take 1 1/2 tablets daily except take 1 tablet on Sun and Thursday or Take as directed by anticoagulation clinic 120 tablet 1   No current facility-administered medications  on file prior to visit.    Past Medical History:  Diagnosis Date   Anemia, unspecified    Anxiety    Arthritis    "about q joint i've got" (07/14/2016)   Benign neoplasm of colon 12/2007   Hyperplastic colon polyps   CAD (coronary artery disease)    minimal catheterization, October 2008   Cellulitis    Chest pain    with stress   Childhood asthma    Chronic low back pain    Constipation    and diarrhea chronic..Dr. Barnet Pall   Depression    Diverticulosis of colon (without mention of hemorrhage)    DVT (deep venous thrombosis) (HCC) 1990s   LLE   Family history of adverse reaction to anesthesia     "daughter died having her 1st child cause she got too much anesthesia"    GERD (gastroesophageal reflux disease)    History of blood transfusion 01/2008   "related to hip OR"   Homocystinemia (HCC)    signif elevation in the past...plan folic acid, B6, B12   Hypopotassemia    Hypotension, unspecified    Leg pain    Lymphoproliferative disease (HCC)    disorder in the past??   Methadone adverse reaction    for chronic leg and back pain   Other pulmonary embolism and infarction 2008   Significant 2008 and coumadin therapy RV dysfunction...echo...2008..EF 50%..right ventricle markedly dilated w marked right ventricular dysfunc and moder tricuspid regurg/echo..March 2010, Ef 50%, mild dilation of right ventricule w mild decrease right ventric function    Peripheral vascular disease (HCC)    Rectus sheath hematoma 07/13/2012   On coumadin    Right bundle branch block    intermittent   Thyroid disease    Tobacco use disorder    Urinary incontinence    Venous insufficiency (chronic) (peripheral)    Patient's legs were wrapped 2013    Past Surgical History:  Procedure Laterality Date   CARDIAC CATHETERIZATION  05/2007   Hattie Perch 12/08/2010   CHOLECYSTECTOMY OPEN     COLONOSCOPY WITH PROPOFOL N/A 08/11/2017   Procedure: COLONOSCOPY WITH PROPOFOL;  Surgeon: Sherrilyn Rist, MD;  Location: [facility];  Service: Gastroenterology;  Laterality: N/A;   GASTRIC BYPASS  1980s   JOINT REPLACEMENT     KNEE ARTHROSCOPY Left 05/2001   Hattie Perch 12/21/2010   SHOULDER ARTHROSCOPY W/ ROTATOR CUFF REPAIR Right 08/2002   Hattie Perch 12/21/2010   TONSILLECTOMY     TOTAL HIP ARTHROPLASTY Right 01/2008    Social History   Socioeconomic History   Marital status: Divorced    Spouse name: Not on file   Number of children: 1   Years of education: Not on file   Highest education level: Not on file  Occupational History   Occupation: Disabled    Employer: UNEMPLOYED  Tobacco Use   Smoking status: Former     Packs/day: 0.10    Years: 27.00    Pack years: 2.70    Types: Cigarettes    Quit date: 06/09/2011    Years since quitting: 9.7   Smokeless tobacco: Never  Vaping Use   Vaping Use: Never used  Substance and Sexual Activity   Alcohol use: No    Alcohol/week: 0.0 standard drinks    Comment: 07/14/2016 "nothing since the early 1990s"   Drug use: No   Sexual activity: Not Currently  Other Topics Concern   Not on file  Social History Narrative   Current smoker wi last  12 mos.    Social Determinants of Health   Financial Resource Strain: Low Risk    Difficulty of Paying Living Expenses: Not hard at all  Food Insecurity: No Food Insecurity   Worried About Programme researcher, broadcasting/film/video in the Last Year: Never true   Ran Out of Food in the Last Year: Never true  Transportation Needs: No Transportation Needs   Lack of Transportation (Medical): No   Lack of Transportation (Non-Medical): No  Physical Activity: Inactive   Days of Exercise per Week: 0 days   Minutes of Exercise per Session: 0 min  Stress: No Stress Concern Present   Feeling of Stress : Not at all  Social Connections: Socially Isolated   Frequency of Communication with Friends and Family: More than three times a week   Frequency of Social Gatherings with Friends and Family: More than three times a week   Attends Religious Services: Never   Database administrator or Organizations: No   Attends Engineer, structural: Never   Marital Status: Never married    Family History  Problem Relation Age of Onset   Heart disease Mother    Heart disease Father    Crohn's disease Sister     Review of Systems  Constitutional:  Negative for chills and fever.  Respiratory:  Positive for shortness of breath. Negative for cough and wheezing.   Cardiovascular:  Positive for leg swelling. Negative for chest pain and palpitations.  Skin:  Positive for rash.  Neurological:  Negative for light-headedness and headaches.      Objective:    Vitals:   03/05/21 1412  BP: 126/80  Pulse: (!) 58  Temp: 98.4 F (36.9 C)  SpO2: 96%   BP Readings from Last 3 Encounters:  03/05/21 126/80  06/08/20 118/76  05/26/20 114/76   Wt Readings from Last 3 Encounters:  06/08/20 (!) 315 lb (142.9 kg)  08/23/19 (!) 331 lb 3.2 oz (150.2 kg)  06/24/19 (!) 323 lb 3.2 oz (146.6 kg)   Body mass index is 52.42 kg/m.   Physical Exam    Constitutional: Appears well-developed and well-nourished. No distress.  HENT:  Head: Normocephalic and atraumatic.  Neck: Neck supple. No tracheal deviation present. No thyromegaly present.  No cervical lymphadenopathy Cardiovascular: Normal rate, regular rhythm and normal heart sounds.   No murmur heard. No carotid bruit .  Bilateral lower extremity lymphedema-skin is tight with chronic skin changes, no pitting Pulmonary/Chest: Effort normal and breath sounds normal. No respiratory distress. No has no wheezes. No rales. Skin: Skin is warm and dry. Not diaphoretic.  Stage I sacral pressure ulcer Psychiatric: Normal mood and affect. Behavior is normal.      Assessment & Plan:    See Problem List for Assessment and Plan of chronic medical problems.    This visit occurred during the SARS-CoV-2 public health emergency.  Safety protocols were in place, including screening questions prior to the visit, additional usage of staff PPE, and extensive cleaning of exam room while observing appropriate contact time as indicated for disinfecting solutions.

## 2021-03-05 ENCOUNTER — Ambulatory Visit (INDEPENDENT_AMBULATORY_CARE_PROVIDER_SITE_OTHER): Payer: Medicare HMO | Admitting: Internal Medicine

## 2021-03-05 ENCOUNTER — Encounter: Payer: Self-pay | Admitting: Internal Medicine

## 2021-03-05 ENCOUNTER — Other Ambulatory Visit (INDEPENDENT_AMBULATORY_CARE_PROVIDER_SITE_OTHER): Payer: Medicare HMO

## 2021-03-05 VITALS — BP 126/80 | HR 58 | Temp 98.4°F | Ht 65.0 in

## 2021-03-05 DIAGNOSIS — L8991 Pressure ulcer of unspecified site, stage 1: Secondary | ICD-10-CM | POA: Insufficient documentation

## 2021-03-05 DIAGNOSIS — E039 Hypothyroidism, unspecified: Secondary | ICD-10-CM

## 2021-03-05 DIAGNOSIS — G8929 Other chronic pain: Secondary | ICD-10-CM

## 2021-03-05 DIAGNOSIS — E538 Deficiency of other specified B group vitamins: Secondary | ICD-10-CM

## 2021-03-05 DIAGNOSIS — J432 Centrilobular emphysema: Secondary | ICD-10-CM | POA: Diagnosis not present

## 2021-03-05 DIAGNOSIS — Z86711 Personal history of pulmonary embolism: Secondary | ICD-10-CM | POA: Diagnosis not present

## 2021-03-05 DIAGNOSIS — L8992 Pressure ulcer of unspecified site, stage 2: Secondary | ICD-10-CM | POA: Insufficient documentation

## 2021-03-05 DIAGNOSIS — I872 Venous insufficiency (chronic) (peripheral): Secondary | ICD-10-CM | POA: Diagnosis not present

## 2021-03-05 DIAGNOSIS — L89151 Pressure ulcer of sacral region, stage 1: Secondary | ICD-10-CM | POA: Diagnosis not present

## 2021-03-05 DIAGNOSIS — M545 Low back pain, unspecified: Secondary | ICD-10-CM | POA: Diagnosis not present

## 2021-03-05 DIAGNOSIS — J439 Emphysema, unspecified: Secondary | ICD-10-CM | POA: Diagnosis not present

## 2021-03-05 DIAGNOSIS — M170 Bilateral primary osteoarthritis of knee: Secondary | ICD-10-CM

## 2021-03-05 DIAGNOSIS — Z6841 Body Mass Index (BMI) 40.0 and over, adult: Secondary | ICD-10-CM | POA: Diagnosis not present

## 2021-03-05 LAB — TSH: TSH: 1.87 u[IU]/mL (ref 0.35–5.50)

## 2021-03-05 NOTE — Assessment & Plan Note (Signed)
Chronic tsh today is in normal range Continue levothyroxine 250 mcg daily

## 2021-03-06 NOTE — Assessment & Plan Note (Signed)
Chronic Following with pain management/neurosurgery She is not a surgical candidate She has significant pain and decreased mobility with recurrent falls She was advised by neurosurgery that she needs a motorized wheelchair She will make a separate visit to discuss her motorized wheelchair Pain management per neurosurgery

## 2021-03-06 NOTE — Assessment & Plan Note (Signed)
New She thought she had a rash in her sacral region, but it is a stage I pressure injury Because of her decreased mobility and chronic pain she is very sedentary.  Discussed etiology of pressure injuries and discussed that she needs to avoid too much pressure on the area-needs to sit on soft surfaces-extra pillows and avoid too much pressure on the area by shifting weight and changing positions

## 2021-03-06 NOTE — Assessment & Plan Note (Signed)
Chronic Lifelong anticoagulation with Coumadin-monitored by her Coumadin nurse-[name] She checks her INR at home Recent INR therapeutic-continue current Coumadin dosing per [facility]

## 2021-03-06 NOTE — Assessment & Plan Note (Signed)
Chronic bilateral lower extremity edema with chronic skin changes-lymphedema Her swelling and skin issues are getting worse Stressed weight loss She is not able to exercise at this time

## 2021-03-11 ENCOUNTER — Other Ambulatory Visit: Payer: Self-pay | Admitting: Internal Medicine

## 2021-03-15 ENCOUNTER — Other Ambulatory Visit: Payer: Self-pay | Admitting: Internal Medicine

## 2021-03-18 DIAGNOSIS — Z86718 Personal history of other venous thrombosis and embolism: Secondary | ICD-10-CM | POA: Diagnosis not present

## 2021-03-18 DIAGNOSIS — Z7901 Long term (current) use of anticoagulants: Secondary | ICD-10-CM | POA: Diagnosis not present

## 2021-03-18 LAB — POCT INR: INR: 2.4 (ref 2.0–3.0)

## 2021-03-19 ENCOUNTER — Ambulatory Visit (INDEPENDENT_AMBULATORY_CARE_PROVIDER_SITE_OTHER): Payer: Medicare HMO

## 2021-03-19 DIAGNOSIS — Z7901 Long term (current) use of anticoagulants: Secondary | ICD-10-CM | POA: Diagnosis not present

## 2021-03-19 DIAGNOSIS — Z86711 Personal history of pulmonary embolism: Secondary | ICD-10-CM

## 2021-03-19 NOTE — Patient Instructions (Addendum)
Pre visit review using our clinic review tool, if applicable. No additional management support is needed unless otherwise documented below in the visit note.  Continue to take 1 1/2 tablets every day except take 1 on Sundays and Thursdays.  Re-check in 2 weeks.

## 2021-03-20 NOTE — Progress Notes (Signed)
Agree with management.  [name] [name], MD  

## 2021-03-30 ENCOUNTER — Ambulatory Visit (INDEPENDENT_AMBULATORY_CARE_PROVIDER_SITE_OTHER): Payer: Medicare HMO

## 2021-03-30 DIAGNOSIS — Z7901 Long term (current) use of anticoagulants: Secondary | ICD-10-CM | POA: Diagnosis not present

## 2021-03-30 LAB — POCT INR: INR: 2.5 (ref 2.0–3.0)

## 2021-03-30 NOTE — Patient Instructions (Addendum)
Pre visit review using our clinic review tool, if applicable. No additional management support is needed unless otherwise documented below in the visit note.  Continue to take 1 1/2 tablets every day except take 1 on Sundays and Thursdays.  Re-check in 2 weeks.

## 2021-04-01 NOTE — Progress Notes (Signed)
Agree with management.  Herman Mell J Ndia Sampath, MD  

## 2021-04-07 ENCOUNTER — Other Ambulatory Visit: Payer: Self-pay | Admitting: Internal Medicine

## 2021-04-07 ENCOUNTER — Encounter: Payer: Self-pay | Admitting: Internal Medicine

## 2021-04-07 DIAGNOSIS — Z7901 Long term (current) use of anticoagulants: Secondary | ICD-10-CM

## 2021-04-13 ENCOUNTER — Ambulatory Visit (INDEPENDENT_AMBULATORY_CARE_PROVIDER_SITE_OTHER): Payer: Medicare HMO

## 2021-04-13 DIAGNOSIS — Z86711 Personal history of pulmonary embolism: Secondary | ICD-10-CM

## 2021-04-13 DIAGNOSIS — Z7901 Long term (current) use of anticoagulants: Secondary | ICD-10-CM | POA: Diagnosis not present

## 2021-04-13 LAB — POCT INR: INR: 2.6 (ref 2.0–3.0)

## 2021-04-13 NOTE — Patient Instructions (Addendum)
Pre visit review using our clinic review tool, if applicable. No additional management support is needed unless otherwise documented below in the visit note.  Continue to take 1 1/2 tablets every day except take 1 on Sundays and Thursdays.  Re-check in 2 weeks.

## 2021-04-15 NOTE — Progress Notes (Signed)
Agree with management.  Jaque Dacy J Alexius Ellington, MD  

## 2021-04-22 ENCOUNTER — Encounter: Payer: Self-pay | Admitting: Internal Medicine

## 2021-04-24 ENCOUNTER — Encounter: Payer: Self-pay | Admitting: Internal Medicine

## 2021-04-27 ENCOUNTER — Ambulatory Visit (INDEPENDENT_AMBULATORY_CARE_PROVIDER_SITE_OTHER): Payer: Medicare HMO

## 2021-04-27 DIAGNOSIS — Z7901 Long term (current) use of anticoagulants: Secondary | ICD-10-CM | POA: Diagnosis not present

## 2021-04-27 DIAGNOSIS — Z86711 Personal history of pulmonary embolism: Secondary | ICD-10-CM | POA: Diagnosis not present

## 2021-04-27 LAB — POCT INR: INR: 3.2 — AB (ref 2.0–3.0)

## 2021-04-27 NOTE — Patient Instructions (Addendum)
Pre visit review using our clinic review tool, if applicable. No additional management support is needed unless otherwise documented below in the visit note.  Take 1 tablet today and then continue to take 1 1/2 tablets every day except take 1 on Sundays and Thursdays.  Re-check in 2 weeks.

## 2021-04-29 ENCOUNTER — Other Ambulatory Visit: Payer: Self-pay | Admitting: Internal Medicine

## 2021-04-29 DIAGNOSIS — Z7901 Long term (current) use of anticoagulants: Secondary | ICD-10-CM

## 2021-04-29 NOTE — Telephone Encounter (Signed)
Pt is consistent with coumadin management. Sent in warfarin.

## 2021-04-29 NOTE — Progress Notes (Signed)
Agree with management.  [name] J Samaria Anes, MD  

## 2021-05-11 ENCOUNTER — Ambulatory Visit (INDEPENDENT_AMBULATORY_CARE_PROVIDER_SITE_OTHER): Payer: Medicare HMO

## 2021-05-11 ENCOUNTER — Ambulatory Visit (INDEPENDENT_AMBULATORY_CARE_PROVIDER_SITE_OTHER): Payer: Medicare HMO | Admitting: Pharmacist

## 2021-05-11 ENCOUNTER — Other Ambulatory Visit: Payer: Self-pay

## 2021-05-11 DIAGNOSIS — I2510 Atherosclerotic heart disease of native coronary artery without angina pectoris: Secondary | ICD-10-CM

## 2021-05-11 DIAGNOSIS — Z86711 Personal history of pulmonary embolism: Secondary | ICD-10-CM | POA: Diagnosis not present

## 2021-05-11 DIAGNOSIS — E782 Mixed hyperlipidemia: Secondary | ICD-10-CM

## 2021-05-11 DIAGNOSIS — Z86718 Personal history of other venous thrombosis and embolism: Secondary | ICD-10-CM | POA: Diagnosis not present

## 2021-05-11 DIAGNOSIS — M545 Low back pain, unspecified: Secondary | ICD-10-CM

## 2021-05-11 DIAGNOSIS — Z7901 Long term (current) use of anticoagulants: Secondary | ICD-10-CM

## 2021-05-11 DIAGNOSIS — E039 Hypothyroidism, unspecified: Secondary | ICD-10-CM

## 2021-05-11 DIAGNOSIS — Z599 Problem related to housing and economic circumstances, unspecified: Secondary | ICD-10-CM

## 2021-05-11 DIAGNOSIS — K219 Gastro-esophageal reflux disease without esophagitis: Secondary | ICD-10-CM

## 2021-05-11 DIAGNOSIS — R296 Repeated falls: Secondary | ICD-10-CM

## 2021-05-11 LAB — POCT INR: INR: 2.4 (ref 2.0–3.0)

## 2021-05-11 NOTE — Progress Notes (Signed)
Chronic Care Management Pharmacy Note  05/14/2021 Name:  Maralee Higuchi MRN:  [id number] DOB:  Nov 23, 1955  Summary: -Pt will have to leave her current rental home due to sewer issues and is in process of finding a new place to live; she is not sure she can afford moving expenses/a new apartment right now  Recommendations/Changes made from today's visit: -Referred to Care Guide for housing difficulties   Subjective: Falicity Sheets is an 65 y.o. year old female who is a primary patient of Burns, Claudina Lick, MD.  The CCM team was consulted for assistance with disease management and care coordination needs.    Engaged with patient by telephone for follow up visit in response to provider referral for pharmacy case management and/or care coordination services.   Consent to Services:  The patient was given information about Chronic Care Management services, agreed to services, and gave verbal consent prior to initiation of services.  Please see initial visit note for detailed documentation.   Patient Care Team: Binnie Rail, MD as PCP - General (Internal Medicine) Wayland Salinas, MD as Referring Physician (Neurosurgery) Newt Minion, MD as Consulting Physician (Orthopedic Surgery) Myrlene Broker, MD as Attending Physician (Urology) Loletha Carrow Kirke Corin, MD as Consulting Physician (Gastroenterology) Charlton Haws, [facility] as Pharmacist (Pharmacist)   Patient lives alone, she does not drive anymore. Her daughter passed away recently. She uses uber/lyft to get around or she has 2 friends who can drive her places occasionally.   "If they don't deliver it, I don't eat it"; she uses grocery delivery from [place].  Has several grandchildren, local and in [place].  Recent office visits: 03/05/21 Dr Quay Burow OV: c/o rash - actually a pressure injury. Advised wt loss.  09/25/20 pt message: rx'd dicyclomine d/t hyoscyamine not covered.  05/26/20 Dr Quay Burow OV: chronic f/u. C/o oozing rash on  torso. Rec'd platform walker. Referred to urogyn for bladder prolapse. Stopped nystatin powder and rx'd ciclopirox cream. Want to avoid diflucan d/t methadone and warfarin. Increased levothyroxine to 250 mcg daily. Ordered TSH for 2 mos but pt did not go.  Recent consult visits: 06/08/20 Wannetta Sender (GYN/Urology) - Seen for Urge incontinence, patient started Myrbetriq 25 mg daily, instructed to  f/u 6 wks   06/15/20 McDonald (Podiatry) - Seen for Hammertoe, no changes, instructed to  f/u 3 mos   09/17/20 McDonald (Podiatry) - Seen for Hammertoe, no changes  Hospital visits: None in previous 6 months  Objective:  Lab Results  Component Value Date   CREATININE 0.70 05/26/2020   BUN 7 05/26/2020   GFR 90.00 05/26/2020   GFRNONAA >60 07/14/2016   GFRAA >60 07/14/2016   NA 139 05/26/2020   K 3.6 05/26/2020   CALCIUM 8.9 05/26/2020   CO2 34 (H) 05/26/2020   GLUCOSE 90 05/26/2020    Lab Results  Component Value Date/Time   HGBA1C 5.5 04/17/2018 02:29 PM   HGBA1C 5.5 10/10/2017 03:06 PM   GFR 90.00 05/26/2020 01:58 PM   GFR 83.74 11/25/2019 02:56 PM    Last diabetic Eye exam: No results found for: HMDIABEYEEXA  Last diabetic Foot exam: No results found for: HMDIABFOOTEX   Lab Results  Component Value Date   CHOL 169 05/24/2019   HDL 32.50 (L) 05/24/2019   LDLCALC 69 04/17/2018   LDLDIRECT 105.0 05/24/2019   TRIG 212.0 (H) 05/24/2019   CHOLHDL 5 05/24/2019    Hepatic Function Latest Ref Rng & Units 05/26/2020 11/25/2019 05/24/2019  Total Protein 6.0 -  8.3 g/dL 7.6 7.4 8.0  Albumin 3.5 - 5.2 g/dL 3.7 3.7 4.0  AST 0 - 37 U/L '26 19 24  ' ALT 0 - 35 U/L '18 11 17  ' Alk Phosphatase 39 - 117 U/L 81 71 97  Total Bilirubin 0.2 - 1.2 mg/dL 0.6 0.6 0.5  Bilirubin, Direct 0.0 - 0.3 mg/dL - - -    Lab Results  Component Value Date/Time   TSH 1.87 03/05/2021 11:22 AM   TSH 9.52 (H) 05/26/2020 01:58 PM   FREET4 0.56 (L) 07/13/2016 09:24 PM   FREET4 0.69 07/24/2014 10:14 AM    CBC  Latest Ref Rng & Units 05/26/2020 11/25/2019 05/24/2019  WBC 4.0 - 10.5 K/uL 5.5 4.5 5.6  Hemoglobin 12.0 - 15.0 g/dL 12.5 12.6 13.6  Hematocrit 36.0 - 46.0 % 38.0 38.4 40.8  Platelets 150.0 - 400.0 K/uL 161.0 170.0 182.0    No results found for: VD25OH  Clinical ASCVD: Yes  The 10-year ASCVD risk score (Arnett DK, et al., 2019) is: 7.4%   Values used to calculate the score:     Age: 26 years     Sex: Female     Is Non-Hispanic African American: No     Diabetic: No     Tobacco smoker: No     Systolic Blood Pressure: 630 mmHg     Is BP treated: No     HDL Cholesterol: 32.5 mg/dL     Total Cholesterol: 169 mg/dL    Depression screen [facility] 2/9 09/29/2020 05/26/2020 04/17/2018  Decreased Interest 0 1 2  Down, Depressed, Hopeless 0 0 2  PHQ - 2 Score 0 1 4  Altered sleeping - 1 0  Tired, decreased energy - 1 2  Change in appetite - 0 1  Feeling bad or failure about yourself  - 0 1  Trouble concentrating - 0 0  Moving slowly or fidgety/restless - 0 0  Suicidal thoughts - 0 0  PHQ-9 Score - 3 8  Difficult doing work/chores - Somewhat difficult Not difficult at all  Some recent data might be hidden    Lab Results  Component Value Date/Time   INR 2.4 05/11/2021 12:00 AM   INR 3.1 (A) 04/27/2021 12:00 AM   INR 2.4 (H) 10/10/2016 04:10 PM   INR 4.7 (H) 08/11/2016 11:57 AM   INR 2.1 09/30/2010 03:50 PM   INR 3.8 08/17/2010 02:12 PM    Social History   Tobacco Use  Smoking Status Former   Packs/day: 0.10   Years: 27.00   Pack years: 2.70   Types: Cigarettes   Quit date: 06/09/2011   Years since quitting: 9.9  Smokeless Tobacco Never   BP Readings from Last 3 Encounters:  03/05/21 126/80  06/08/20 118/76  05/26/20 114/76   Pulse Readings from Last 3 Encounters:  03/05/21 (!) 58  05/26/20 80  11/25/19 67   Wt Readings from Last 3 Encounters:  06/08/20 (!) 315 lb (142.9 kg)  08/23/19 (!) 331 lb 3.2 oz (150.2 kg)  06/24/19 (!) 323 lb 3.2 oz (146.6 kg)   BMI Readings  from Last 3 Encounters:  03/05/21 52.42 kg/m  06/08/20 52.42 kg/m  05/26/20 55.11 kg/m    Assessment/Interventions: Review of patient past medical history, allergies, medications, health status, including review of consultants reports, laboratory and other test data, was performed as part of comprehensive evaluation and provision of chronic care management services.   SDOH:  (Social Determinants of Health) assessments and interventions performed: Yes  SDOH Screenings  Alcohol Screen: Low Risk    Last Alcohol Screening Score (AUDIT): 0  Depression (PHQ2-9): Low Risk    PHQ-2 Score: 0  Financial Resource Strain: Low Risk    Difficulty of Paying Living Expenses: Not hard at all  Food Insecurity: No Food Insecurity   Worried About Charity fundraiser in the Last Year: Never true   Ran Out of Food in the Last Year: Never true  Housing: Low Risk    Last Housing Risk Score: 0  Physical Activity: Inactive   Days of Exercise per Week: 0 days   Minutes of Exercise per Session: 0 min  Social Connections: Socially Isolated   Frequency of Communication with Friends and Family: More than three times a week   Frequency of Social Gatherings with Friends and Family: More than three times a week   Attends Religious Services: Never   Marine scientist or Organizations: No   Attends Music therapist: Never   Marital Status: Never married  Stress: No Stress Concern Present   Feeling of Stress : Not at all  Tobacco Use: Medium Risk   Smoking Tobacco Use: Former   Smokeless Tobacco Use: Never  Transportation Needs: No Data processing manager (Medical): No   Lack of Transportation (Non-Medical): No    CCM Care Plan  Allergies  Allergen Reactions   Amitiza [Lubiprostone] Diarrhea and Nausea And Vomiting   Morphine Anaphylaxis    REACTION: nausea, rash, itching, vomiting   Narcan [Naloxone Hcl] Other (See Comments)    Pt was not her self   Nsaids      Bowel irregularity (coffee grounds)   Venlafaxine Other (See Comments)    itching    Medications Reviewed Today     Reviewed by Charlton Haws, [facility] (Pharmacist) on 05/11/21 at 1427  Med List Status: <None>   Medication Order Taking? Sig Documenting Provider Last Dose Status Informant  bisacodyl (DULCOLAX) 5 MG EC tablet [id number] Yes Take 30 mg by mouth daily as needed for moderate constipation. [provider] Taking Active Self  HYDROmorphone (DILAUDID) 4 MG tablet [id number] Yes TAKE 1 TO 2 TABLETS BY MOUTH EVERY 6 HOURS AS NEEDED FOR BREAKTHROUGH PAIN [provider] Taking Active Self           Med Note (DAVIS, SOPHIA A   Thu Jun 15, 2017  2:47 PM)    levothyroxine (SYNTHROID) 200 MCG tablet [id number] Yes TAKE 1 TABLET BY MOUTH EVERY DAY BEFORE BREAKFAST Burns, Claudina Lick, MD Taking Active   levothyroxine (SYNTHROID) 50 MCG tablet [id number] Yes TAKE 1 TABLET (50 MCG TOTAL) BY MOUTH DAILY BEFORE BREAKFAST. TAKE IN ADDITION TO 200 MCG PILL FOR A TOTAL OF 250 MCG DAILY Burns, Claudina Lick, MD Taking Active   methadone (DOLOPHINE) 10 MG tablet [id number] Yes Take 10 mg by mouth every 8 (eight) hours as needed. for pain [provider] Taking Active Self           Med Note Rosemarie Beath, MELISSA B   Wed Aug 02, 2017  6:37 PM)    RABEprazole (ACIPHEX) 20 MG tablet [id number] Yes Take 20 mg by mouth daily. [provider] Taking Active   vitamin B-12 (CYANOCOBALAMIN) 1000 MCG tablet [id number] Yes Take 1 tablet (1,000 mcg total) by mouth daily. Binnie Rail, MD Taking Active   warfarin (COUMADIN) 5 MG tablet [id number] Yes TAKE 1 AND 1/2 TABLETS DAILY EXCEPT TAKE 1 TABLET ON SUN AND THURSDAY OR TAKE  AS DIRECTED BY ANTICOAGULATION CLINIC Binnie Rail, MD Taking Active             Patient Active Problem List   Diagnosis Date Noted   Pressure injury of sacral region, stage 1 03/05/2021   Tinea corporis 05/26/2020   Lesion of tongue 11/25/2019   Left upper arm  pain 10/16/2018   Mixed incontinence 11/10/2017   B12 deficiency 10/12/2017   Microscopic hematuria 10/12/2017   Bilateral primary osteoarthritis of knee 11/18/2016   Female bladder prolapse 10/10/2016   Long term current use of anticoagulant 10/10/2016   Falls frequently 07/14/2016   Syncope 07/14/2016   Hyperlipidemia 06/10/2014   Severe obesity (BMI >= 40) ([place]) 06/10/2014   CAD (coronary artery disease)    Interstitial cystitis 01/31/2013   Chronic narcotic dependence ([place]) 01/30/2013   S/P hip replacement 11/01/2012   Venous insufficiency (chronic) (peripheral)    Atherosclerosis of native arteries of the extremities with ulceration ([place]) 07/21/2011   Esophageal spasm 06/14/2011   Hypothyroidism 01/19/2010   LEG CRAMPS, NOCTURNAL 01/18/2010   Chronic acquired lymphedema 01/18/2010   History of pulmonary embolism 08/10/2007   COPD with emphysema ([place]) 08/10/2007   LOW BACK PAIN, CHRONIC 08/10/2007   MEDIASTINAL ADENOPATHY CASTLEMANS D 08/10/2007    Immunization History  Administered Date(s) Administered   Fluad Quad(high Dose 65+) 05/24/2019, 05/26/2020   Influenza Split 04/22/2011   Influenza, High Dose Seasonal PF 04/17/2018   Influenza,inj,Quad PF,6+ Mos 06/03/2013, 06/10/2014, 07/15/2016, 04/12/2017   PFIZER Comirnaty(Gray Top)Covid-19 Tri-Sucrose Vaccine 02/27/2021   PFIZER(Purple Top)SARS-COV-2 Vaccination 09/14/2019, 10/05/2019, 02/27/2021   Pneumococcal Conjugate-13 06/10/2014   Pneumococcal Polysaccharide-23 05/24/2019   Tdap 06/22/2012   Unspecified SARS-COV-2 Vaccination 09/14/2019, 10/05/2019, 07/28/2020    Conditions to be addressed/monitored:  Hyperlipidemia, Coronary Artery Disease, GERD, Hypothyroidism and Osteoarthritis, history of PE, Chronic constipation  Care Plan : [place]  Updates made by Charlton Haws, [place] since 05/14/2021 12:00 AM     Problem: Hyperlipidemia, Coronary Artery Disease, GERD, Hypothyroidism and  Osteoarthritis, history of PE, Chronic constipation   Priority: High     Long-Range Goal: Disease management   Start Date: 11/30/2020  Expected End Date: 05/14/2022  This Visit's Progress: On track  Recent Progress: On track  Priority: High  Note:   Current Barriers:  Unable to independently monitor therapeutic efficacy Suboptimal therapeutic regimen for HLD  Pharmacist Clinical Goal(s):  Patient will verbalize ability to afford treatment regimen achieve adherence to monitoring guidelines and medication adherence to achieve therapeutic efficacy through collaboration with PharmD and provider.   Interventions: 1:1 collaboration with Binnie Rail, MD regarding development and update of comprehensive plan of care as evidenced by provider attestation and co-signature Inter-disciplinary care team collaboration (see longitudinal plan of care) Comprehensive medication review performed; medication list updated in electronic medical record  Hyperlipidemia: (LDL goal < 70) -Hx minimal CAD, PAD w/ venous insufficiency -Not ideally controlled - due to hx of atherosclerosis patient would benefit from statin therapy; she tried atorvastatin previously and had significant myalgias; she is not willing to start a different statin today but may consider in the future -Current treatment: no medication -Medications previously tried: atorvastatin (2012, 2014) -Educated on Benefits of statin for ASCVD risk reduction; Importance of limiting foods high in cholesterol; -Counseled on diet and exercise extensively - may consider hydrophillic statin in the future (pravastatin, rosuvastatin, pitavastatin)   History of PE Goal: prevent recurrent VTE) -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 Managed per PCP coumadin clinic - home INR machine.  Lifelong anticoagulation. -Controlled - INR is at goal; pt denies s/sx of bleeding or clot -Current treatment  Warfarin 5 mg - 1.5 tab daily except 1 tab Sun/Thurs -Medications previously  tried: Lovenox -Counseled on potential drug interactions with warfarin - NSAIDs, Tylenol -Recommended to continue current medication  Hypothyroidism (Goal: maintain TSH in goal range) -Controlled- repeat TSH is WNL -Pt takes levothyroxine at 4-5 am, goes back to bed and wakes up at 8-9 -Current treatment  Levothyroxine 200 mcg daily Levothyroxine 50 mcg daily  -Recommend to continue current medication  Chronic pain (Goal: manage pain) -managed per Dr Marcell Barlow @ pain mgmt -bilateral osteoarthritis of knees; low back pain; pt reports 80% of pain comes from lower back -Controlled - per pt she does not use any other OTC pain meds due to interactions with warfarin; she does not use topicals because administration to low back is difficult -Current treatment  Methadone 10 mg  - 2 tab q8h  (#180/month) Hydromorphone 4 mg 1-2 tab q6h prn (#120/month) -Counseled on using medication as prescribed, advised not to take more than prescribed due to risk for respiratory depression/oversedation -Recommended to continue current medication   GERD (Goal: manage symptoms) -Controlled - pt reports symptoms are much improved after starting rabeprazole; she reports her neurologist prescribed this for her when she mentioned stomach issues -Current treatment  Rabeprazole 20 mg daily -Recommend to continue current medication  Housing difficulties  -Uncontrolled - pt reports she is being kicked out of her rental apartment due to sewer problems, the home will be shut down for at least 90 days for repairs and her landlord told her he will also be raising the rent, therefore she is looking to relocate; she reports she has several friends who she can ask to stay with temporarily but she will need help finding a permanent new home; she has struggled financially and is not sure where to start -Plan: referred to Care Guide for housing   Health Maintenance -Vaccine gaps: Shingrix, Flu -DEXA scan is overdue (never  done) -Pt reports Medicare will not pay for a wheelchair unless she cannot get around her home; she struggles mainly with  -Advised patient get Shingrix, Flu vaccine  Patient Goals/Self-Care Activities Patient will:  - take medications as prescribed -focus on medication adherence by pill box  -Speak with Care Guide about housing options -Schedule DEXA scan, mammogram     Medication Assistance:  None needed  Compliance/Adherence/Medication fill history: Care Gaps: Mammogram (due 08/11/18) DEXA scan (never done) Cologuard (due 06/01/20)  Star-Rating Drugs: None  Patient's preferred pharmacy is:  [id number] [place] [id number]: [phone]: [phone] Uses pill box? Yes Pt endorses 100% compliance  We discussed: Current pharmacy is preferred with insurance plan and patient is satisfied with pharmacy services Patient decided to: Continue current medication management strategy  Care Plan and Follow Up Patient Decision:  Patient agrees to Care Plan and Follow-up.  Plan: Telephone follow up appointment with care management team member scheduled for:  3 months  LCharlene Brooke PharmD, [place] CPP Clinical Pharmacist [phone]

## 2021-05-11 NOTE — Patient Instructions (Addendum)
Pre visit review using our clinic review tool, if applicable. No additional management support is needed unless otherwise documented below in the visit note. Continue to take 1 1/2 tablets every day except take 1 on Sundays and Thursdays.  Re-check in 2 weeks.

## 2021-05-11 NOTE — Progress Notes (Signed)
Agree with management.  Carmen Hult J Latorya Bautch, MD  

## 2021-05-12 ENCOUNTER — Telehealth: Payer: Self-pay | Admitting: *Deleted

## 2021-05-12 NOTE — Telephone Encounter (Signed)
   Telephone encounter was:  Successful.  05/12/2021 Name: [name] MRN: [id number] DOB: 03/21/1956  [name] is a 65 y.o. year old female who is a primary care patient of Burns, Claudina Lick, MD . The community resource team was consulted for assistance with [email or web address]  Care guide performed the following interventions: Patient provided with information about care guide support team and interviewed to confirm resource needs Follow up call placed to community resources to determine status of patients referral.  Follow Up Plan:  No further follow up planned at this time. The patient has been provided with needed resources. [place], Care Management  [phone] [address] , [place] [address] Email : [email or web address]

## 2021-05-14 NOTE — Patient Instructions (Signed)
Visit Information  Phone number for Pharmacist: [phone]   Goals Addressed             This Visit's Progress    Manage My Medicine       Timeframe:  Long-Range Goal Priority:  Medium Start Date:   11/30/20                          Expected End Date:    06/06/22                 Follow Up Date Jan 2023   - call for medicine refill 2 or 3 days before it runs out - call if I am sick and can't take my medicine - keep a list of all the medicines I take; vitamins and herbals too - use a pillbox to sort medicine  -Schedule mammogram, DEXA scan, colonoscopy -Get Shingrix vaccine and covid booster   Why is this important?   These steps will help you keep on track with your medicines.   Notes:         Care Plan : [place]  Updates made by Charlton Haws, RPH since 05/14/2021 12:00 AM     Problem: Hyperlipidemia, Coronary Artery Disease, GERD, Hypothyroidism and Osteoarthritis, history of PE, Chronic constipation   Priority: High     Long-Range Goal: Disease management   Start Date: 11/30/2020  Expected End Date: 05/14/2022  This Visit's Progress: On track  Recent Progress: On track  Priority: High  Note:   Current Barriers:  Unable to independently monitor therapeutic efficacy Suboptimal therapeutic regimen for HLD  Pharmacist Clinical Goal(s):  Patient will verbalize ability to afford treatment regimen achieve adherence to monitoring guidelines and medication adherence to achieve therapeutic efficacy through collaboration with PharmD and provider.   Interventions: 1:1 collaboration with Binnie Rail, MD regarding development and update of comprehensive plan of care as evidenced by provider attestation and co-signature Inter-disciplinary care team collaboration (see longitudinal plan of care) Comprehensive medication review performed; medication list updated in electronic medical record  Hyperlipidemia: (LDL goal < 70) -Hx minimal CAD, PAD w/  venous insufficiency -Not ideally controlled - due to hx of atherosclerosis patient would benefit from statin therapy; she tried atorvastatin previously and had significant myalgias; she is not willing to start a different statin today but may consider in the future -Current treatment: no medication -Medications previously tried: atorvastatin (2012, 2014) -Educated on Benefits of statin for ASCVD risk reduction; Importance of limiting foods high in cholesterol; -Counseled on diet and exercise extensively - may consider hydrophillic statin in the future (pravastatin, rosuvastatin, pitavastatin)   History of PE Goal: prevent recurrent VTE) -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 Managed per PCP coumadin clinic - home INR machine. Lifelong anticoagulation. -Controlled - INR is at goal; pt denies s/sx of bleeding or clot -Current treatment  Warfarin 5 mg - 1.5 tab daily except 1 tab Sun/Thurs -Medications previously tried: Lovenox -Counseled on potential drug interactions with warfarin - NSAIDs, Tylenol -Recommended to continue current medication  Hypothyroidism (Goal: maintain TSH in goal range) -Controlled- repeat TSH is WNL -Pt takes levothyroxine at 4-5 am, goes back to bed and wakes up at 8-9 -Current treatment  Levothyroxine 200 mcg daily Levothyroxine 50 mcg daily  -Recommend to continue current medication  Chronic pain (Goal: manage pain) -managed per Dr Marcell Barlow @ pain mgmt -bilateral osteoarthritis of knees; low back pain; pt reports 80% of pain comes from lower  back -Controlled - per pt she does not use any other OTC pain meds due to interactions with warfarin; she does not use topicals because administration to low back is difficult -Current treatment  Methadone 10 mg  - 2 tab q8h  (#180/month) Hydromorphone 4 mg 1-2 tab q6h prn (#120/month) -Counseled on using medication as prescribed, advised not to take more than prescribed due to risk for respiratory depression/oversedation -Recommended to  continue current medication   GERD (Goal: manage symptoms) -Controlled - pt reports symptoms are much improved after starting rabeprazole; she reports her neurologist prescribed this for her when she mentioned stomach issues -Current treatment  Rabeprazole 20 mg daily -Recommend to continue current medication  Housing difficulties  -Uncontrolled - pt reports she is being kicked out of her rental apartment due to sewer problems, the home will be shut down for at least 90 days for repairs and her landlord told her he will also be raising the rent, therefore she is looking to relocate; she reports she has several friends who she can ask to stay with temporarily but she will need help finding a permanent new home; she has struggled financially and is not sure where to start -Plan: referred to Care Guide for housing   Health Maintenance -Vaccine gaps: Shingrix, Flu -DEXA scan is overdue (never done) -Pt reports Medicare will not pay for a wheelchair unless she cannot get around her home; she struggles mainly with  -Advised patient get Shingrix, Flu vaccine  Patient Goals/Self-Care Activities Patient will:  - take medications as prescribed -focus on medication adherence by pill box  -Speak with Care Guide about housing options -Schedule DEXA scan, mammogram      Patient verbalizes understanding of instructions provided today and agrees to view in [place].  Telephone follow up appointment with pharmacy team member scheduled for: 3 months  Charlene Brooke, PharmD, [place], CPP Clinical Pharmacist [place] [facility] [phone]

## 2021-05-25 ENCOUNTER — Ambulatory Visit (INDEPENDENT_AMBULATORY_CARE_PROVIDER_SITE_OTHER): Payer: Medicare HMO

## 2021-05-25 ENCOUNTER — Encounter: Payer: Self-pay | Admitting: Internal Medicine

## 2021-05-25 DIAGNOSIS — Z7901 Long term (current) use of anticoagulants: Secondary | ICD-10-CM

## 2021-05-25 DIAGNOSIS — Z86711 Personal history of pulmonary embolism: Secondary | ICD-10-CM | POA: Diagnosis not present

## 2021-05-25 LAB — POCT INR: INR: 2.4 (ref 2.0–3.0)

## 2021-05-25 NOTE — Patient Instructions (Addendum)
Pre visit review using our clinic review tool, if applicable. No additional management support is needed unless otherwise documented below in the visit note.  Continue to take 1 1/2 tablets every day except take 1 on Sundays and Thursdays.  Re-check in 2 weeks.

## 2021-05-30 NOTE — Progress Notes (Signed)
Agree with management.  Carmen Chambless J Neira Bentsen, MD  

## 2021-06-01 ENCOUNTER — Other Ambulatory Visit: Payer: Self-pay | Admitting: Internal Medicine

## 2021-06-05 ENCOUNTER — Encounter: Payer: Self-pay | Admitting: Internal Medicine

## 2021-06-05 DIAGNOSIS — F439 Reaction to severe stress, unspecified: Secondary | ICD-10-CM

## 2021-06-05 DIAGNOSIS — Z5989 Other problems related to housing and economic circumstances: Secondary | ICD-10-CM

## 2021-06-07 ENCOUNTER — Encounter: Payer: Self-pay | Admitting: Internal Medicine

## 2021-06-07 DIAGNOSIS — I2510 Atherosclerotic heart disease of native coronary artery without angina pectoris: Secondary | ICD-10-CM

## 2021-06-07 DIAGNOSIS — E782 Mixed hyperlipidemia: Secondary | ICD-10-CM | POA: Diagnosis not present

## 2021-06-07 DIAGNOSIS — E039 Hypothyroidism, unspecified: Secondary | ICD-10-CM

## 2021-06-08 ENCOUNTER — Telehealth: Payer: Self-pay | Admitting: *Deleted

## 2021-06-08 ENCOUNTER — Ambulatory Visit (INDEPENDENT_AMBULATORY_CARE_PROVIDER_SITE_OTHER): Payer: Medicare HMO

## 2021-06-08 DIAGNOSIS — Z7901 Long term (current) use of anticoagulants: Secondary | ICD-10-CM

## 2021-06-08 LAB — POCT INR: INR: 2.1 (ref 2.0–3.0)

## 2021-06-08 NOTE — Patient Instructions (Addendum)
Pre visit review using our clinic review tool, if applicable. No additional management support is needed unless otherwise documented below in the visit note.  Continue to take 1 1/2 tablets every day except take 1 tablet on Sundays and Thursdays.  Re-check in 2 weeks.

## 2021-06-08 NOTE — Progress Notes (Signed)
Continue to take 1 1/2 tablets every day except take 1 on Sundays and Thursdays.  Re-check in 2 weeks.

## 2021-06-08 NOTE — Chronic Care Management (AMB) (Signed)
  Chronic Care Management   Note  06/08/2021 Name: [name] MRN: [id number] DOB: July 04, 1956  [name] is a 65 y.o. year old female who is a primary care patient of Burns, Claudina Lick, MD. [name] is currently enrolled in care management services. An additional referral for Social Worker was placed.   Follow up plan: Telephone appointment with care management team member scheduled for:06/10/21  [place] Management  Direct Dial: [phone]

## 2021-06-10 ENCOUNTER — Ambulatory Visit (INDEPENDENT_AMBULATORY_CARE_PROVIDER_SITE_OTHER): Payer: Medicare HMO | Admitting: *Deleted

## 2021-06-10 ENCOUNTER — Other Ambulatory Visit: Payer: Self-pay | Admitting: *Deleted

## 2021-06-10 DIAGNOSIS — E782 Mixed hyperlipidemia: Secondary | ICD-10-CM

## 2021-06-10 DIAGNOSIS — F439 Reaction to severe stress, unspecified: Secondary | ICD-10-CM

## 2021-06-10 DIAGNOSIS — K5903 Drug induced constipation: Secondary | ICD-10-CM

## 2021-06-10 DIAGNOSIS — I2510 Atherosclerotic heart disease of native coronary artery without angina pectoris: Secondary | ICD-10-CM

## 2021-06-10 DIAGNOSIS — Z7901 Long term (current) use of anticoagulants: Secondary | ICD-10-CM

## 2021-06-10 DIAGNOSIS — E039 Hypothyroidism, unspecified: Secondary | ICD-10-CM

## 2021-06-10 DIAGNOSIS — R296 Repeated falls: Secondary | ICD-10-CM

## 2021-06-10 NOTE — Chronic Care Management (AMB) (Signed)
Chronic Care Management    Clinical Social Work Note  06/10/2021 Name: Donnae Michels MRN: [id number] DOB: 12/17/1955  Sherby Moncayo is a 65 y.o. year old female who is a primary care patient of Burns, Claudina Lick, MD. The CCM team was consulted to assist the patient with chronic disease management and/or care coordination needs related to: [employer] .   Engaged with patient by telephone for initial visit in response to provider referral for social work chronic care management and care coordination services.   Consent to Services:  The patient was given information about Chronic Care Management services, agreed to services, and gave verbal consent prior to initiation of services.  Please see initial visit note for detailed documentation.   Patient agreed to services and consent obtained.   Assessment: Review of patient past medical history, allergies, medications, and health status, including review of relevant consultants reports was performed today as part of a comprehensive evaluation and provision of chronic care management and care coordination services.     SDOH (Social Determinants of Health) assessments and interventions performed:  SDOH Interventions    Flowsheet Row Most Recent Value  SDOH Interventions   Housing Interventions Other (Comment)  [Community Care Guide referral]  Stress Interventions Other (Comment)  [housing related]        Advanced Directives Status: Not addressed in this encounter.  CCM Care Plan  Allergies  Allergen Reactions   Amitiza [Lubiprostone] Diarrhea and Nausea And Vomiting   Morphine Anaphylaxis    REACTION: nausea, rash, itching, vomiting   Narcan [Naloxone Hcl] Other (See Comments)    Pt was not her self   Nsaids     Bowel irregularity (coffee grounds)   Venlafaxine Other (See Comments)    itching    Outpatient Encounter Medications as of 06/10/2021  Medication Sig   bisacodyl (DULCOLAX) 5 MG EC tablet Take 30 mg by mouth  daily as needed for moderate constipation.   HYDROmorphone (DILAUDID) 4 MG tablet TAKE 1 TO 2 TABLETS BY MOUTH EVERY 6 HOURS AS NEEDED FOR BREAKTHROUGH PAIN   levothyroxine (SYNTHROID) 200 MCG tablet TAKE 1 TABLET BY MOUTH EVERY DAY BEFORE BREAKFAST   levothyroxine (SYNTHROID) 50 MCG tablet TAKE 1 TABLET (50 MCG TOTAL) BY MOUTH DAILY BEFORE BREAKFAST. TAKE IN ADDITION TO 200 MCG PILL FOR A TOTAL OF 250 MCG DAILY   methadone (DOLOPHINE) 10 MG tablet Take 10 mg by mouth every 8 (eight) hours as needed. for pain   RABEprazole (ACIPHEX) 20 MG tablet Take 20 mg by mouth daily.   vitamin B-12 (CYANOCOBALAMIN) 1000 MCG tablet Take 1 tablet (1,000 mcg total) by mouth daily.   warfarin (COUMADIN) 5 MG tablet TAKE 1 AND 1/2 TABLETS DAILY EXCEPT TAKE 1 TABLET ON SUN AND THURSDAY OR TAKE AS DIRECTED BY ANTICOAGULATION CLINIC   No facility-administered encounter medications on file as of 06/10/2021.    Patient Active Problem List   Diagnosis Date Noted   Pressure injury of sacral region, stage 1 03/05/2021   Tinea corporis 05/26/2020   Lesion of tongue 11/25/2019   Left upper arm pain 10/16/2018   Mixed incontinence 11/10/2017   B12 deficiency 10/12/2017   Microscopic hematuria 10/12/2017   Bilateral primary osteoarthritis of knee 11/18/2016   Female bladder prolapse 10/10/2016   Long term current use of anticoagulant 10/10/2016   Falls frequently 07/14/2016   Syncope 07/14/2016   Hyperlipidemia 06/10/2014   Severe obesity (BMI >= 40) ([place]) 06/10/2014   CAD (coronary artery disease)  Interstitial cystitis 01/31/2013   Chronic narcotic dependence ([place]) 01/30/2013   S/P hip replacement 11/01/2012   Venous insufficiency (chronic) (peripheral)    Atherosclerosis of native arteries of the extremities with ulceration ([place]) 07/21/2011   Esophageal spasm 06/14/2011   Hypothyroidism 01/19/2010   LEG CRAMPS, NOCTURNAL 01/18/2010   Chronic acquired lymphedema 01/18/2010   History of pulmonary embolism  08/10/2007   COPD with emphysema ([place]) 08/10/2007   LOW BACK PAIN, CHRONIC 08/10/2007   MEDIASTINAL ADENOPATHY CASTLEMANS D 08/10/2007    Conditions to be addressed/monitored:  acute stress ; Housing barriers and Mental Health Concerns   Care Plan : LCSW Plan of Care  Updates made by Deirdre Peer, LCSW since 06/10/2021 12:00 AM     Problem: Home and Family Safety (Wellness)      Goal: Home safety maintained   Start Date: 06/10/2021  Expected End Date: 08/06/2021  This Visit's Progress: On track  Priority: High  Note:   Current barriers:    Housing barriers Clinical Goals: Patient will work with CSW and Care Guide to address needs related to housing needs Clinical Interventions:  Pt reports her rental home is being temporarily condemned due to some plumbing issues and the landlord is not renewing the lease. Pt has a Section 8 voucher and reports she has spoken with a complex where she can move mid December. She has to be out of her place by this Sunday and will be staying with a friend who has a room she can use.  Pt states this has all caused her new onset stress and denies any other stressors or any past mental health concerns. She is coping well with the situation and is in good spirits today.  Assessment of needs, barriers , agencies contacted, as well as how impacting  Solution-Focused Strategies  Active listening / Reflection utilized   Review various resources, discussed options and provided patient information about  Housing resources   Referral to care guide (housing, [employer] referral)  1:1 collaboration with primary care provider regarding development and update of comprehensive plan of care as evidenced by provider attestation and co-signature Inter-disciplinary care team collaboration (see longitudinal plan of care) Patient Goals/Self-Care Activities: Over the next 30 days Coordinate with housing options to assist with new apartment options Collaborate with  the community resource care guide        Follow Up Plan: SW will follow up with patient by phone 06/28/21      .  Eduard Clos MSW, LCSW Licensed Clinical Social Worker [facility] [place] [phone]

## 2021-06-10 NOTE — Patient Instructions (Signed)
Visit Information   PATIENT GOALS/PLAN OF CARE:  Care Plan : LCSW Plan of Care  Updates made by Deirdre Peer, LCSW since 06/10/2021 12:00 AM     Problem: Home and Family Safety (Wellness)      Goal: Home safety maintained   Start Date: 06/10/2021  Expected End Date: 08/06/2021  This Visit's Progress: On track  Priority: High  Note:   Current barriers:    Housing barriers Clinical Goals: Patient will work with CSW and Care Guide to address needs related to housing needs Clinical Interventions:  Pt reports her rental home is being temporarily condemned due to some plumbing issues and the landlord is not renewing the lease. Pt has a Section 8 voucher and reports she has spoken with a complex where she can move mid December. She has to be out of her place by this Sunday and will be staying with a friend who has a room she can use.  Pt states this has all caused her new onset stress and denies any other stressors or any past mental health concerns. She is coping well with the situation and is in good spirits today.  Assessment of needs, barriers , agencies contacted, as well as how impacting  Solution-Focused Strategies  Active listening / Reflection utilized   Review various resources, discussed options and provided patient information about  Housing resources   Referral to care guide (housing, [employer] referral)  1:1 collaboration with primary care provider regarding development and update of comprehensive plan of care as evidenced by provider attestation and co-signature Inter-disciplinary care team collaboration (see longitudinal plan of care) Patient Goals/Self-Care Activities: Over the next 30 days Coordinate with housing options to assist with new apartment options Collaborate with the community resource care guide       Consent to CCM Services: Ms. Broccoli was given information about Chronic Care Management services including:  CCM service includes personalized  support from designated clinical staff supervised by her physician, including individualized plan of care and coordination with other care providers 24/7 contact phone numbers for assistance for urgent and routine care needs. Service will only be billed when office clinical staff spend 20 minutes or more in a month to coordinate care. Only one practitioner may furnish and bill the service in a calendar month. The patient may stop CCM services at any time (effective at the end of the month) by phone call to the office staff. The patient will be responsible for cost sharing (co-pay) of up to 20% of the service fee (after annual deductible is met).  Patient agreed to services and verbal consent obtained.   The patient verbalized understanding of instructions, educational materials, and care plan provided today and declined offer to receive copy of patient instructions, educational materials, and care plan.   Telephone follow up appointment with care management team member scheduled for:06/28/21  Eduard Clos MSW, LCSW Licensed Clinical Social Worker [place] [phone]

## 2021-06-14 ENCOUNTER — Telehealth: Payer: Self-pay | Admitting: *Deleted

## 2021-06-14 NOTE — Telephone Encounter (Signed)
   Telephone encounter was:  Successful.  06/14/2021 Name: Katherene Dinino MRN: [id number] DOB: 05-29-1956  Zori Benbrook is a 65 y.o. year old female who is a primary care patient of Burns, Claudina Lick, MD . The community resource team was consulted for assistance with Patient is a bit overwelmed with the moving to the old [place] he going to call the apartment to confirm move in date , syaing with a friend until the 1st of [place] guide performed the following interventions: Patient provided with information about care guide support team and interviewed to confirm resource needs Follow up call placed to community resources to determine status of patients referral.  Follow Up Plan:  Care guide will follow up with patient by phone over the next days  [place], Care Management  [phone] [address] , [place] [address] Email : [email or web address]

## 2021-06-20 ENCOUNTER — Encounter: Payer: Self-pay | Admitting: Internal Medicine

## 2021-06-22 ENCOUNTER — Ambulatory Visit (INDEPENDENT_AMBULATORY_CARE_PROVIDER_SITE_OTHER): Payer: Medicare HMO

## 2021-06-22 DIAGNOSIS — Z86711 Personal history of pulmonary embolism: Secondary | ICD-10-CM | POA: Diagnosis not present

## 2021-06-22 DIAGNOSIS — Z7901 Long term (current) use of anticoagulants: Secondary | ICD-10-CM | POA: Diagnosis not present

## 2021-06-22 LAB — POCT INR: INR: 3.1 — AB (ref 2.0–3.0)

## 2021-06-22 NOTE — Patient Instructions (Addendum)
Pre visit review using our clinic review tool, if applicable. No additional management support is needed unless otherwise documented below in the visit note.  Only take 1 tablet today and then continue to take 1 1/2 tablets every day except take 1 tablet on Sundays and Thursdays.  Re-check in 2 weeks.

## 2021-06-22 NOTE — Progress Notes (Signed)
Only take 1 tablet today and then continue to take 1 1/2 tablets every day except take 1 tablet on Sundays and Thursdays.  Re-check in 2 weeks.

## 2021-06-23 ENCOUNTER — Telehealth: Payer: Self-pay | Admitting: *Deleted

## 2021-06-23 NOTE — Telephone Encounter (Signed)
   Telephone encounter was:  Successful.  06/23/2021 Name: [name] MRN: [id number] DOB: 1955/12/09  [name] is a 65 y.o. year old female who is a primary care patient of Burns, Claudina Lick, MD . The community resource team was consulted for assistance with Patient has had her housing taken back but i have refered her to [place] . She is continuing to look to for housing sent more housing options as well as [employer] , section 8 information  Care guide performed the following interventions: Patient provided with information about care guide support team and interviewed to confirm resource needs Follow up call placed to community resources to determine status of patients referral.  Follow Up Plan:  No further follow up planned at this time. The patient has been provided with needed resources.  [place], Care Management  [phone] [address] , [place] [address] Email : [email or web address] [email or web address]

## 2021-06-28 ENCOUNTER — Ambulatory Visit: Payer: Medicare HMO | Admitting: *Deleted

## 2021-06-28 DIAGNOSIS — F439 Reaction to severe stress, unspecified: Secondary | ICD-10-CM

## 2021-06-28 DIAGNOSIS — I2510 Atherosclerotic heart disease of native coronary artery without angina pectoris: Secondary | ICD-10-CM

## 2021-06-28 DIAGNOSIS — Z86711 Personal history of pulmonary embolism: Secondary | ICD-10-CM

## 2021-06-28 DIAGNOSIS — Z5989 Other problems related to housing and economic circumstances: Secondary | ICD-10-CM

## 2021-06-28 NOTE — Chronic Care Management (AMB) (Signed)
Chronic Care Management    Clinical Social Work Note  06/28/2021 Name: [name] MRN: [id number] DOB: July 02, 1956  [name] is a 65 y.o. year old female who is a primary care patient of Burns, Claudina Lick, MD. The CCM team was consulted to assist the patient with chronic disease management and/or care coordination needs related to:  housing .   Engaged with patient by telephone for follow up visit in response to provider referral for social work chronic care management and care coordination services.   Consent to Services:  The patient was given information about Chronic Care Management services, agreed to services, and gave verbal consent prior to initiation of services.  Please see initial visit note for detailed documentation.   Patient agreed to services and consent obtained.   Assessment: Review of patient past medical history, allergies, medications, and health status, including review of relevant consultants reports was performed today as part of a comprehensive evaluation and provision of chronic care management and care coordination services.     SDOH (Social Determinants of Health) assessments and interventions performed:    Advanced Directives Status: Not addressed in this encounter.  CCM Care Plan  Allergies  Allergen Reactions   Amitiza [Lubiprostone] Diarrhea and Nausea And Vomiting   Morphine Anaphylaxis    REACTION: nausea, rash, itching, vomiting   Narcan [Naloxone Hcl] Other (See Comments)    Pt was not her self   Nsaids     Bowel irregularity (coffee grounds)   Venlafaxine Other (See Comments)    itching    Outpatient Encounter Medications as of 06/28/2021  Medication Sig   bisacodyl (DULCOLAX) 5 MG EC tablet Take 30 mg by mouth daily as needed for moderate constipation.   HYDROmorphone (DILAUDID) 4 MG tablet TAKE 1 TO 2 TABLETS BY MOUTH EVERY 6 HOURS AS NEEDED FOR BREAKTHROUGH PAIN   levothyroxine (SYNTHROID) 200 MCG tablet TAKE 1 TABLET BY MOUTH  EVERY DAY BEFORE BREAKFAST   levothyroxine (SYNTHROID) 50 MCG tablet TAKE 1 TABLET (50 MCG TOTAL) BY MOUTH DAILY BEFORE BREAKFAST. TAKE IN ADDITION TO 200 MCG PILL FOR A TOTAL OF 250 MCG DAILY   methadone (DOLOPHINE) 10 MG tablet Take 10 mg by mouth every 8 (eight) hours as needed. for pain   RABEprazole (ACIPHEX) 20 MG tablet Take 20 mg by mouth daily.   vitamin B-12 (CYANOCOBALAMIN) 1000 MCG tablet Take 1 tablet (1,000 mcg total) by mouth daily.   warfarin (COUMADIN) 5 MG tablet TAKE 1 AND 1/2 TABLETS DAILY EXCEPT TAKE 1 TABLET ON SUN AND THURSDAY OR TAKE AS DIRECTED BY ANTICOAGULATION CLINIC   No facility-administered encounter medications on file as of 06/28/2021.    Patient Active Problem List   Diagnosis Date Noted   Pressure injury of sacral region, stage 1 03/05/2021   Tinea corporis 05/26/2020   Lesion of tongue 11/25/2019   Left upper arm pain 10/16/2018   Mixed incontinence 11/10/2017   B12 deficiency 10/12/2017   Microscopic hematuria 10/12/2017   Bilateral primary osteoarthritis of knee 11/18/2016   Female bladder prolapse 10/10/2016   Long term current use of anticoagulant 10/10/2016   Falls frequently 07/14/2016   Syncope 07/14/2016   Hyperlipidemia 06/10/2014   Severe obesity (BMI >= 40) ([place]) 06/10/2014   CAD (coronary artery disease)    Interstitial cystitis 01/31/2013   Chronic narcotic dependence ([place]) 01/30/2013   S/P hip replacement 11/01/2012   Venous insufficiency (chronic) (peripheral)    Atherosclerosis of native arteries of the extremities with ulceration ([place]) 07/21/2011  Esophageal spasm 06/14/2011   Hypothyroidism 01/19/2010   LEG CRAMPS, NOCTURNAL 01/18/2010   Chronic acquired lymphedema 01/18/2010   History of pulmonary embolism 08/10/2007   COPD with emphysema ([place]) 08/10/2007   LOW BACK PAIN, CHRONIC 08/10/2007   MEDIASTINAL ADENOPATHY CASTLEMANS D 08/10/2007    Conditions to be addressed/monitored: Anxiety and housing concerns ; Limited  social support and Lacks knowledge of community resource:    Care Plan : LCSW Plan of Care  Updates made by Deirdre Peer, LCSW since 06/28/2021 12:00 AM     Problem: Home and Family Safety (Wellness)      Goal: Home safety maintained   Start Date: 06/10/2021  Expected End Date: 08/06/2021  This Visit's Progress: On track  Recent Progress: On track  Priority: High  Note:   Current barriers:    Housing barriers Clinical Goals: Patient will work with CSW and Care Guide to address needs related to housing needs Clinical Interventions:  Pt reports she was forced to move out of her rental home where she has lived for 12 years and seeking new housing that will accept her Section 8 voucher. She remains optimistic and seeking options; hopeful for a couple rental properties to pan out. She is staying temporarily with a friend and is grateful to them offering a room to stay temporarily.   She is also thankful for the assistance provided by our [employer], Bonnielee Haff, for helping to get her linked and approved for furnishings through the [employer]. She looks forward to getting her new place and being able to get these donations in her new place....  CSW offered support and offered to reach out to her Section 8 worker to seek updates/support. Pt will call back with #.  Encouraged pt to continue to check into any options she hears of.   Assessment of needs, barriers , agencies contacted, as well as how impacting  Solution-Focused Strategies  Active listening / Reflection utilized   Review various resources, discussed options and provided patient information about  Housing resources   Referral to care guide (housing, [employer] referral)  1:1 collaboration with primary care provider regarding development and update of comprehensive plan of care as evidenced by provider attestation and co-signature Inter-disciplinary care team collaboration (see longitudinal  plan of care) Patient Goals/Self-Care Activities: Over the next 30 days Coordinate with housing options to assist with new apartment options Collaborate with the community resource care guide        Follow Up Plan: Appointment scheduled for SW follow up with client by phone on: 07/29/21      Eduard Clos MSW, [place] Licensed Clinical Social Worker [place] [phone]

## 2021-06-28 NOTE — Patient Instructions (Signed)
Visit Information  Thank you for taking time to visit with me today. Please don't hesitate to contact me if I can be of assistance to you before our next scheduled telephone appointment.  Following are the goals we discussed today:  (Copy and paste patient goals from clinical care plan here)  Our next appointment is by telephone on 07/29/21 at 11:30  Please call the care guide team at [phone] if you need to cancel or reschedule your appointment.   Please call 911 go to [facility] Urgent [facility] [address] [place] [phone]) call the [facility]: [phone] call the Suicide and Crisis Lifeline: 72 if you are experiencing a Mental Health or [place] or need someone to talk to.  The patient verbalized understanding of instructions, educational materials, and care plan provided today and declined offer to receive copy of patient instructions, educational materials, and care plan.   Eduard Clos MSW, LCSW Licensed Clinical Social Worker [facility] [place] [phone]

## 2021-07-03 ENCOUNTER — Encounter: Payer: Self-pay | Admitting: Internal Medicine

## 2021-07-06 ENCOUNTER — Ambulatory Visit (INDEPENDENT_AMBULATORY_CARE_PROVIDER_SITE_OTHER): Payer: Medicare HMO

## 2021-07-06 DIAGNOSIS — Z7901 Long term (current) use of anticoagulants: Secondary | ICD-10-CM | POA: Diagnosis not present

## 2021-07-06 DIAGNOSIS — Z86711 Personal history of pulmonary embolism: Secondary | ICD-10-CM

## 2021-07-06 LAB — POCT INR: INR: 3 (ref 2.0–3.0)

## 2021-07-06 NOTE — Progress Notes (Signed)
Continue to take 1 1/2 tablets every day except take 1 tablet on Sundays and Thursdays.  Re-check in 2 weeks.

## 2021-07-06 NOTE — Patient Instructions (Addendum)
Pre visit review using our clinic review tool, if applicable. No additional management support is needed unless otherwise documented below in the visit note.  Continue to take 1 1/2 tablets every day except take 1 tablet on Sundays and Thursdays.  Re-check in 2 weeks.

## 2021-07-07 DIAGNOSIS — I2510 Atherosclerotic heart disease of native coronary artery without angina pectoris: Secondary | ICD-10-CM

## 2021-07-09 ENCOUNTER — Encounter: Payer: Self-pay | Admitting: Internal Medicine

## 2021-07-10 ENCOUNTER — Encounter: Payer: Self-pay | Admitting: Internal Medicine

## 2021-07-16 ENCOUNTER — Encounter: Payer: Self-pay | Admitting: Internal Medicine

## 2021-07-20 ENCOUNTER — Ambulatory Visit (INDEPENDENT_AMBULATORY_CARE_PROVIDER_SITE_OTHER): Payer: Medicare HMO

## 2021-07-20 DIAGNOSIS — Z7901 Long term (current) use of anticoagulants: Secondary | ICD-10-CM

## 2021-07-20 LAB — POCT INR: INR: 2.6 (ref 2.0–3.0)

## 2021-07-20 NOTE — Progress Notes (Signed)
Patient ID: [name], female   DOB: 01-Feb-1956, 65 y.o.   MRN: [id number]  Medical screening examination/treatment/procedure(s) were performed by non-physician practitioner and as supervising physician I was immediately available for consultation/collaboration.  I agree with above. Cathlean Cower, MD

## 2021-07-20 NOTE — Patient Instructions (Addendum)
Pre visit review using our clinic review tool, if applicable. No additional management support is needed unless otherwise documented below in the visit note.  Continue to take 1 1/2 tablets every day except take 1 tablet on Sundays and Thursdays.  Re-check in 2 weeks.

## 2021-07-20 NOTE — Progress Notes (Signed)
Continue to take 1 1/2 tablets every day except take 1 tablet on Sundays and Thursdays.  Re-check in 2 weeks.

## 2021-07-25 ENCOUNTER — Emergency Department (HOSPITAL_COMMUNITY)
Admission: EM | Admit: 2021-07-25 | Discharge: 2021-07-25 | Disposition: A | Discharge status: Home / Self Care | Payer: Medicare HMO | Attending: Emergency Medicine | Admitting: Emergency Medicine

## 2021-07-25 ENCOUNTER — Emergency Department (HOSPITAL_COMMUNITY): Payer: Medicare HMO

## 2021-07-25 ENCOUNTER — Encounter (HOSPITAL_COMMUNITY): Payer: Self-pay | Admitting: Emergency Medicine

## 2021-07-25 ENCOUNTER — Other Ambulatory Visit: Payer: Self-pay

## 2021-07-25 DIAGNOSIS — E039 Hypothyroidism, unspecified: Secondary | ICD-10-CM | POA: Diagnosis not present

## 2021-07-25 DIAGNOSIS — N390 Urinary tract infection, site not specified: Secondary | ICD-10-CM

## 2021-07-25 DIAGNOSIS — M79605 Pain in left leg: Secondary | ICD-10-CM | POA: Diagnosis not present

## 2021-07-25 DIAGNOSIS — J449 Chronic obstructive pulmonary disease, unspecified: Secondary | ICD-10-CM | POA: Insufficient documentation

## 2021-07-25 DIAGNOSIS — Z87891 Personal history of nicotine dependence: Secondary | ICD-10-CM | POA: Insufficient documentation

## 2021-07-25 DIAGNOSIS — X58XXXA Exposure to other specified factors, initial encounter: Secondary | ICD-10-CM | POA: Diagnosis not present

## 2021-07-25 DIAGNOSIS — Z96641 Presence of right artificial hip joint: Secondary | ICD-10-CM | POA: Insufficient documentation

## 2021-07-25 DIAGNOSIS — M1612 Unilateral primary osteoarthritis, left hip: Secondary | ICD-10-CM | POA: Diagnosis not present

## 2021-07-25 DIAGNOSIS — M25752 Osteophyte, left hip: Secondary | ICD-10-CM | POA: Diagnosis not present

## 2021-07-25 DIAGNOSIS — S46912A Strain of unspecified muscle, fascia and tendon at shoulder and upper arm level, left arm, initial encounter: Secondary | ICD-10-CM | POA: Insufficient documentation

## 2021-07-25 DIAGNOSIS — J45909 Unspecified asthma, uncomplicated: Secondary | ICD-10-CM | POA: Diagnosis not present

## 2021-07-25 DIAGNOSIS — I2510 Atherosclerotic heart disease of native coronary artery without angina pectoris: Secondary | ICD-10-CM | POA: Insufficient documentation

## 2021-07-25 DIAGNOSIS — S76012A Strain of muscle, fascia and tendon of left hip, initial encounter: Secondary | ICD-10-CM | POA: Insufficient documentation

## 2021-07-25 DIAGNOSIS — Z7901 Long term (current) use of anticoagulants: Secondary | ICD-10-CM | POA: Diagnosis not present

## 2021-07-25 DIAGNOSIS — Z79899 Other long term (current) drug therapy: Secondary | ICD-10-CM | POA: Insufficient documentation

## 2021-07-25 DIAGNOSIS — S39012A Strain of muscle, fascia and tendon of lower back, initial encounter: Secondary | ICD-10-CM | POA: Diagnosis not present

## 2021-07-25 DIAGNOSIS — M19012 Primary osteoarthritis, left shoulder: Secondary | ICD-10-CM | POA: Diagnosis not present

## 2021-07-25 DIAGNOSIS — S79912A Unspecified injury of left hip, initial encounter: Secondary | ICD-10-CM | POA: Diagnosis present

## 2021-07-25 DIAGNOSIS — M79652 Pain in left thigh: Secondary | ICD-10-CM | POA: Diagnosis not present

## 2021-07-25 LAB — COMPREHENSIVE METABOLIC PANEL
ALT: 15 U/L (ref 0–44)
AST: 22 U/L (ref 15–41)
Albumin: 3.3 g/dL — ABNORMAL LOW (ref 3.5–5.0)
Alkaline Phosphatase: 77 U/L (ref 38–126)
Anion gap: 5 (ref 5–15)
BUN: 5 mg/dL — ABNORMAL LOW (ref 8–23)
CO2: 32 mmol/L (ref 22–32)
Calcium: 8.6 mg/dL — ABNORMAL LOW (ref 8.9–10.3)
Chloride: 99 mmol/L (ref 98–111)
Creatinine, Ser: 0.79 mg/dL (ref 0.44–1.00)
GFR, Estimated: >60 mL/min (ref >60)
Glucose, Bld: 93 mg/dL (ref 70–99)
Potassium: 3.1 mmol/L — ABNORMAL LOW (ref 3.5–5.1)
Sodium: 136 mmol/L (ref 135–145)
Total Bilirubin: 1.2 mg/dL (ref 0.3–1.2)
Total Protein: 7.3 g/dL (ref 6.5–8.1)

## 2021-07-25 LAB — CBC
HCT: 41.1 % (ref 36.0–46.0)
Hemoglobin: 13.2 g/dL (ref 12.0–15.0)
MCH: 29.3 pg (ref 26.0–34.0)
MCHC: 32.1 g/dL (ref 30.0–36.0)
MCV: 91.3 fL (ref 80.0–100.0)
Platelets: 142 10*3/uL — ABNORMAL LOW (ref 150–400)
RBC: 4.5 MIL/uL (ref 3.87–5.11)
RDW: 13.7 % (ref 11.5–15.5)
WBC: 6.6 10*3/uL (ref 4.0–10.5)
nRBC: 0 % (ref 0.0–0.2)

## 2021-07-25 LAB — URINALYSIS, MICROSCOPIC (REFLEX): WBC, UA: >50 WBC/hpf (ref 0–5)

## 2021-07-25 LAB — URINALYSIS, ROUTINE W REFLEX MICROSCOPIC
Bilirubin Urine: NEGATIVE
Glucose, UA: NEGATIVE mg/dL
Ketones, ur: NEGATIVE mg/dL
Nitrite: NEGATIVE
Protein, ur: NEGATIVE mg/dL
Specific Gravity, Urine: 1.015 (ref 1.005–1.030)
pH: 7 (ref 5.0–8.0)

## 2021-07-25 MED ORDER — LIDOCAINE 5 % EX PTCH: 1.0000 | MEDICATED_PATCH | CUTANEOUS | 0 refills | Status: DC

## 2021-07-25 MED ORDER — CEPHALEXIN 500 MG PO CAPS
ORAL_CAPSULE | ORAL | 0 refills | Status: DC
Start: 2021-07-25 — End: 2021-09-09

## 2021-07-25 MED ORDER — GABAPENTIN 100 MG PO CAPS: 100.0000 mg | ORAL_CAPSULE | Freq: Every day | ORAL | 0 refills | Status: DC

## 2021-07-25 MED ORDER — GABAPENTIN 300 MG PO CAPS
300.0000 mg | ORAL_CAPSULE | Freq: Once | ORAL | Status: AC
  Administered 2021-07-25: 17:00:00 300 mg via ORAL
  Filled 2021-07-25: qty 1

## 2021-07-25 MED ORDER — POTASSIUM CHLORIDE CRYS ER 20 MEQ PO TBCR
40.0000 meq | EXTENDED_RELEASE_TABLET | Freq: Once | ORAL | Status: AC
  Administered 2021-07-25: 17:00:00 40 meq via ORAL
  Filled 2021-07-25: qty 2

## 2021-07-25 MED ORDER — HYDROMORPHONE HCL 1 MG/ML IJ SOLN
1.0000 mg | Freq: Once | INTRAMUSCULAR | Status: AC
  Administered 2021-07-25: 19:00:00 1 mg via INTRAMUSCULAR
  Filled 2021-07-25: qty 1

## 2021-07-25 NOTE — ED Provider Notes (Signed)
Emergency Medicine Provider Triage Evaluation Note  [name] , a 65 y.o. female  was evaluated in triage.  Pt complains of left shoulder and hip pain. States same began 1 week ago when she was touring an apartment. States the landlord had to pull her into the apartment while she walked up stairs. States pain has been getting worse to the point where she is no longer able to walk. She normally walks with a cane. Also endorses 1 week of dysuria and some increasing fatigue. No fevers or chills  Review of Systems  Positive: L shoulder, L hip pain, dysuria Negative: Fevers, chills  Physical Exam  BP (!) 124/53 (BP Location: Right Arm)    Pulse 68    Temp 98.4 F (36.9 C) (Oral)    Resp 20    SpO2 97%  Gen:   Awake, no distress   Resp:  Normal effort  MSK:   Moves extremities without difficulty  Other:   Medical Decision Making  Medically screening exam initiated at 10:11 AM.  Appropriate orders placed.  [name] was informed that the remainder of the evaluation will be completed by another provider, this initial triage assessment does not replace that evaluation, and the importance of remaining in the ED until their evaluation is complete.     Nestor Lewandowsky 07/25/21 1013    Pattricia Boss, MD 08/03/21 [phone]

## 2021-07-25 NOTE — ED Triage Notes (Addendum)
Pt to triage via [facility] from home.  Reports L thigh and hip pain x 1 week that is progressively getting worse.  States pain started when she stepped up into an apt she went to see last week.  States she also has L shoulder pain because she had to pull herself up into the apt as people were pushing her from behind.  Normally ambulates with cane as needed but is having difficulty ambulating.    Also reports strong smelling and dark colored urine x 1 week.
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
## 2021-07-25 NOTE — ED Notes (Signed)
Able to stand up and walk in hallway with walker. Very slow and some unsteady, but able to ambulate.

## 2021-07-25 NOTE — Discharge Instructions (Signed)
Your shoulder and hip pain is likely due to muscle strain.  Take gabapentin nightly for symptom control.  Apply lidoderm patches to affected area for comfort.  Take keflex as treatment of UTI.  Follow up with your doctor for further care.

## 2021-07-26 ENCOUNTER — Encounter: Payer: Self-pay | Admitting: Internal Medicine

## 2021-07-29 ENCOUNTER — Ambulatory Visit (INDEPENDENT_AMBULATORY_CARE_PROVIDER_SITE_OTHER): Payer: Medicare HMO | Admitting: *Deleted

## 2021-07-29 ENCOUNTER — Other Ambulatory Visit: Payer: Self-pay

## 2021-07-29 DIAGNOSIS — M545 Low back pain, unspecified: Secondary | ICD-10-CM

## 2021-07-29 DIAGNOSIS — F439 Reaction to severe stress, unspecified: Secondary | ICD-10-CM

## 2021-07-29 DIAGNOSIS — Z86711 Personal history of pulmonary embolism: Secondary | ICD-10-CM

## 2021-07-29 DIAGNOSIS — I2510 Atherosclerotic heart disease of native coronary artery without angina pectoris: Secondary | ICD-10-CM

## 2021-07-29 DIAGNOSIS — L89151 Pressure ulcer of sacral region, stage 1: Secondary | ICD-10-CM

## 2021-07-29 DIAGNOSIS — Z599 Problem related to housing and economic circumstances, unspecified: Secondary | ICD-10-CM

## 2021-07-29 DIAGNOSIS — R296 Repeated falls: Secondary | ICD-10-CM

## 2021-07-29 NOTE — Patient Instructions (Signed)
Visit Information  Thank you for taking time to visit with me today. Please don't hesitate to contact me if I can be of assistance to you before our next scheduled telephone appointment.  Following are the goals we discussed today:  (Copy and paste patient goals from clinical care plan here)  Our next appointment is by telephone on 08/16/21 at [facility]  Please call the care guide team at [phone] if you need to cancel or reschedule your appointment.   If you are experiencing a Mental Health or [place] or need someone to talk to, please call the Suicide and Crisis Lifeline: 988 go to [facility] [phone]) call 911   The patient verbalized understanding of instructions, educational materials, and care plan provided today and declined offer to receive copy of patient instructions, educational materials, and care plan.   Eduard Clos MSW, LCSW Licensed Clinical Social Worker [facility] [phone]
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
## 2021-08-03 ENCOUNTER — Ambulatory Visit (INDEPENDENT_AMBULATORY_CARE_PROVIDER_SITE_OTHER): Payer: Medicare HMO

## 2021-08-03 ENCOUNTER — Encounter: Payer: Self-pay | Admitting: Internal Medicine

## 2021-08-03 DIAGNOSIS — Z7901 Long term (current) use of anticoagulants: Secondary | ICD-10-CM | POA: Diagnosis not present

## 2021-08-03 LAB — POCT INR: INR: 3.1 — AB (ref 2.0–3.0)

## 2021-08-03 NOTE — Patient Instructions (Addendum)
Pre visit review using our clinic review tool, if applicable. No additional management support is needed unless otherwise documented below in the visit note.  Only take 1 tablet today and then continue to take 1 1/2 tablets every day except take 1 tablet on Sundays and Thursdays.  Re-check in 2 weeks.

## 2021-08-03 NOTE — Progress Notes (Signed)
Only take 1 tablet today and then continue to take 1 1/2 tablets every day except take 1 tablet on Sundays and Thursdays.  Re-check in 2 weeks.

## 2021-08-03 NOTE — Progress Notes (Signed)
Patient ID: [name], female   DOB: 03-09-1956, 65 y.o.   MRN: [id number]  Medical screening examination/treatment/procedure(s) were performed by non-physician practitioner and as supervising physician I was immediately available for consultation/collaboration.  I agree with above. Cathlean Cower, MD

## 2021-08-07 DIAGNOSIS — I2510 Atherosclerotic heart disease of native coronary artery without angina pectoris: Secondary | ICD-10-CM

## 2021-08-09 ENCOUNTER — Encounter: Payer: Self-pay | Admitting: Internal Medicine

## 2021-08-10 ENCOUNTER — Ambulatory Visit: Payer: Medicare HMO | Admitting: Internal Medicine

## 2021-08-10 DIAGNOSIS — L304 Erythema intertrigo: Secondary | ICD-10-CM | POA: Insufficient documentation

## 2021-08-11 ENCOUNTER — Telehealth: Payer: Medicare HMO

## 2021-08-11 NOTE — Progress Notes (Unsigned)
Chronic Care Management Pharmacy Note  08/11/2021 Name:  [name] MRN:  [id number] DOB:  August 22, 1955  Summary: -Pt will have to leave her current rental home due to sewer issues and is in process of finding a new place to live; she is not sure she can afford moving expenses/a new apartment right now  Recommendations/Changes made from today's visit: -Referred to Care Guide for housing difficulties   Subjective: [name] is an 66 y.o. year old female who is a primary patient of Burns, Claudina Lick, MD.  The CCM team was consulted for assistance with disease management and care coordination needs.    Engaged with patient by telephone for follow up visit in response to provider referral for pharmacy case management and/or care coordination services.   Consent to Services:  The patient was given information about Chronic Care Management services, agreed to services, and gave verbal consent prior to initiation of services.  Please see initial visit note for detailed documentation.   Patient Care Team: Binnie Rail, MD as PCP - General (Internal Medicine) Wayland Salinas, MD as Referring Physician (Neurosurgery) Newt Minion, MD as Consulting Physician (Orthopedic Surgery) Myrlene Broker, MD as Attending Physician (Urology) Loletha Carrow Kirke Corin, MD as Consulting Physician (Gastroenterology) Charlton Haws, [facility] as Pharmacist (Pharmacist) Deirdre Peer, LCSW as Social Worker (Licensed Clinical Social Worker)   Patient lives alone, she does not drive anymore. Her daughter passed away recently. She uses uber/lyft to get around or she has 2 friends who can drive her places occasionally.   "If they don't deliver it, I don't eat it"; she uses grocery delivery from [place].  Has several grandchildren, local and in [place].  Recent office visits: 03/05/21 Dr Quay Burow OV: c/o rash - actually a pressure injury. Advised wt loss.  09/25/20 pt message: rx'd dicyclomine d/t  hyoscyamine not covered.  05/26/20 Dr Quay Burow OV: chronic f/u. C/o oozing rash on torso. Rec'd platform walker. Referred to urogyn for bladder prolapse. Stopped nystatin powder and rx'd ciclopirox cream. Want to avoid diflucan d/t methadone and warfarin. Increased levothyroxine to 250 mcg daily. Ordered TSH for 2 mos but pt did not go.  Recent consult visits: None since last visit   Hospital visits: 07/25/2021 - ED visit - UTI , and left shoulder and hip strain - prescribed cephalexin, gabapentin, and lidocaine patches   Objective:  Lab Results  Component Value Date   CREATININE 0.79 07/25/2021   BUN 5 (L) 07/25/2021   GFR 90.00 05/26/2020   GFRNONAA >60 07/25/2021   GFRAA >60 07/14/2016   NA 136 07/25/2021   K 3.1 (L) 07/25/2021   CALCIUM 8.6 (L) 07/25/2021   CO2 32 07/25/2021   GLUCOSE 93 07/25/2021    Lab Results  Component Value Date/Time   HGBA1C 5.5 04/17/2018 02:29 PM   HGBA1C 5.5 10/10/2017 03:06 PM   GFR 90.00 05/26/2020 01:58 PM   GFR 83.74 11/25/2019 02:56 PM    Last diabetic Eye exam: No results found for: HMDIABEYEEXA  Last diabetic Foot exam: No results found for: HMDIABFOOTEX   Lab Results  Component Value Date   CHOL 169 05/24/2019   HDL 32.50 (L) 05/24/2019   LDLCALC 69 04/17/2018   LDLDIRECT 105.0 05/24/2019   TRIG 212.0 (H) 05/24/2019   CHOLHDL 5 05/24/2019    Hepatic Function Latest Ref Rng & Units 07/25/2021 05/26/2020 11/25/2019  Total Protein 6.5 - 8.1 g/dL 7.3 7.6 7.4  Albumin 3.5 - 5.0 g/dL 3.3(L) 3.7 3.7  AST 15 - 41 U/L _0 ALT 0 - 44 U/L _1 Alk Phosphatase 38 - 126 U/L 77 81 71  Total Bilirubin 0.3 - 1.2 mg/dL 1.2 0.6 0.6  Bilirubin, Direct 0.0 - 0.3 mg/dL - - -    Lab Results  Component Value Date/Time   TSH 1.87 03/05/2021 11:22 AM   TSH 9.52 (H) 05/26/2020 01:58 PM   FREET4 0.56 (L) 07/13/2016 09:24 PM   FREET4 0.69 07/24/2014 10:14 AM    CBC Latest Ref Rng & Units 07/25/2021 05/26/2020 11/25/2019  WBC 4.0 - 10.5  K/uL 6.6 5.5 4.5  Hemoglobin 12.0 - 15.0 g/dL 13.2 12.5 12.6  Hematocrit 36.0 - 46.0 % 41.1 38.0 38.4  Platelets 150 - 400 K/uL 142(L) 161.0 170.0    No results found for: VD25OH  Clinical ASCVD: Yes  The 10-year ASCVD risk score (Arnett DK, et al., 2019) is: 8.1%   Values used to calculate the score:     Age: 42 years     Sex: Female     Is Non-Hispanic African American: No     Diabetic: No     Tobacco smoker: No     Systolic Blood Pressure: 383 mmHg     Is BP treated: No     HDL Cholesterol: 32.5 mg/dL     Total Cholesterol: 169 mg/dL    Depression screen [facility] 2/9 09/29/2020 05/26/2020 04/17/2018  Decreased Interest 0 1 2  Down, Depressed, Hopeless 0 0 2  PHQ - 2 Score 0 1 4  Altered sleeping - 1 0  Tired, decreased energy - 1 2  Change in appetite - 0 1  Feeling bad or failure about yourself  - 0 1  Trouble concentrating - 0 0  Moving slowly or fidgety/restless - 0 0  Suicidal thoughts - 0 0  PHQ-9 Score - 3 8  Difficult doing work/chores - Somewhat difficult Not difficult at all  Some recent data might be hidden    Lab Results  Component Value Date/Time   INR 3.1 (A) 08/03/2021 12:00 AM   INR 2.6 07/20/2021 12:00 AM   INR 2.4 (H) 10/10/2016 04:10 PM   INR 4.7 (H) 08/11/2016 11:57 AM   INR 2.1 09/30/2010 03:50 PM   INR 3.8 08/17/2010 02:12 PM    Social History   Tobacco Use  Smoking Status Former   Packs/day: 0.10   Years: 27.00   Pack years: 2.70   Types: Cigarettes   Quit date: 06/09/2011   Years since quitting: 10.1  Smokeless Tobacco Never   BP Readings from Last 3 Encounters:  07/25/21 132/60  03/05/21 126/80  06/08/20 118/76   Pulse Readings from Last 3 Encounters:  07/25/21 (!) 58  03/05/21 (!) 58  05/26/20 80   Wt Readings from Last 3 Encounters:  07/25/21 (!) 315 lb (142.9 kg)  06/08/20 (!) 315 lb (142.9 kg)  08/23/19 (!) 331 lb 3.2 oz (150.2 kg)   BMI Readings from Last 3 Encounters:  07/25/21 52.42 kg/m  03/05/21 52.42 kg/m   06/08/20 52.42 kg/m    Assessment/Interventions: Review of patient past medical history, allergies, medications, health status, including review of consultants reports, laboratory and other test data, was performed as part of comprehensive evaluation and provision of chronic care management services.   SDOH:  (Social Determinants of Health) assessments and interventions performed: Yes  SDOH Screenings   Alcohol Screen: Low Risk    Last Alcohol Screening Score (AUDIT): 0  Depression (  PHQ2-9): Low Risk    PHQ-2 Score: 0  Financial Resource Strain: Low Risk    Difficulty of Paying Living Expenses: Not hard at all  Food Insecurity: No Food Insecurity   Worried About Charity fundraiser in the Last Year: Never true   Ran Out of Food in the Last Year: Never true  Housing: Medium Risk   Last Housing Risk Score: 1  Physical Activity: Inactive   Days of Exercise per Week: 0 days   Minutes of Exercise per Session: 0 min  Social Connections: Socially Isolated   Frequency of Communication with Friends and Family: More than three times a week   Frequency of Social Gatherings with Friends and Family: More than three times a week   Attends Religious Services: Never   Marine scientist or Organizations: No   Attends Music therapist: Never   Marital Status: Never married  Stress: Stress Concern Present   Feeling of Stress : Rather much  Tobacco Use: Medium Risk   Smoking Tobacco Use: Former   Smokeless Tobacco Use: Never   Passive Exposure: Not on file  Transportation Needs: No Transportation Needs   Lack of Transportation (Medical): No   Lack of Transportation (Non-Medical): No    CCM Care Plan  Allergies  Allergen Reactions   Amitiza [Lubiprostone] Diarrhea and Nausea And Vomiting   Morphine Anaphylaxis    REACTION: nausea, rash, itching, vomiting   Narcan [Naloxone Hcl] Other (See Comments)    Pt was not her self   Nsaids     Bowel irregularity (coffee  grounds)   Venlafaxine Other (See Comments)    itching    Medications Reviewed Today     Reviewed by Charlton Haws, [facility] (Pharmacist) on 05/11/21 at 1427  Med List Status: <None>   Medication Order Taking? Sig Documenting Provider Last Dose Status Informant  bisacodyl (DULCOLAX) 5 MG EC tablet [id number] Yes Take 30 mg by mouth daily as needed for moderate constipation. [provider] Taking Active Self  HYDROmorphone (DILAUDID) 4 MG tablet [id number] Yes TAKE 1 TO 2 TABLETS BY MOUTH EVERY 6 HOURS AS NEEDED FOR BREAKTHROUGH PAIN [provider] Taking Active Self           Med Note (DAVIS, SOPHIA A   Thu Jun 15, 2017  2:47 PM)    levothyroxine (SYNTHROID) 200 MCG tablet [id number] Yes TAKE 1 TABLET BY MOUTH EVERY DAY BEFORE BREAKFAST Burns, Claudina Lick, MD Taking Active   levothyroxine (SYNTHROID) 50 MCG tablet [id number] Yes TAKE 1 TABLET (50 MCG TOTAL) BY MOUTH DAILY BEFORE BREAKFAST. TAKE IN ADDITION TO 200 MCG PILL FOR A TOTAL OF 250 MCG DAILY Burns, Claudina Lick, MD Taking Active   methadone (DOLOPHINE) 10 MG tablet [id number] Yes Take 10 mg by mouth every 8 (eight) hours as needed. for pain [provider] Taking Active Self           Med Note Rosemarie Beath, MELISSA B   Wed Aug 02, 2017  6:37 PM)    RABEprazole (ACIPHEX) 20 MG tablet [id number] Yes Take 20 mg by mouth daily. [provider] Taking Active   vitamin B-12 (CYANOCOBALAMIN) 1000 MCG tablet [id number] Yes Take 1 tablet (1,000 mcg total) by mouth daily. Binnie Rail, MD Taking Active   warfarin (COUMADIN) 5 MG tablet [id number] Yes TAKE 1 AND 1/2 TABLETS DAILY EXCEPT TAKE 1 TABLET ON SUN AND THURSDAY OR TAKE AS DIRECTED BY ANTICOAGULATION CLINIC Burns, Claudina Lick, MD Taking Active  Patient Active Problem List   Diagnosis Date Noted   Pressure injury of sacral region, stage 1 03/05/2021   Tinea corporis 05/26/2020   Lesion of tongue 11/25/2019   Left upper arm pain 10/16/2018   Mixed  incontinence 11/10/2017   B12 deficiency 10/12/2017   Microscopic hematuria 10/12/2017   Bilateral primary osteoarthritis of knee 11/18/2016   Female bladder prolapse 10/10/2016   Long term current use of anticoagulant 10/10/2016   Falls frequently 07/14/2016   Syncope 07/14/2016   Hyperlipidemia 06/10/2014   Severe obesity (BMI >= 40) ([place]) 06/10/2014   CAD (coronary artery disease)    Interstitial cystitis 01/31/2013   Chronic narcotic dependence ([place]) 01/30/2013   S/P hip replacement 11/01/2012   Venous insufficiency (chronic) (peripheral)    Atherosclerosis of native arteries of the extremities with ulceration ([place]) 07/21/2011   Esophageal spasm 06/14/2011   Hypothyroidism 01/19/2010   LEG CRAMPS, NOCTURNAL 01/18/2010   Chronic acquired lymphedema 01/18/2010   History of pulmonary embolism 08/10/2007   COPD with emphysema ([place]) 08/10/2007   LOW BACK PAIN, CHRONIC 08/10/2007   MEDIASTINAL ADENOPATHY CASTLEMANS D 08/10/2007    Immunization History  Administered Date(s) Administered   Fluad Quad(high Dose 65+) 05/24/2019, 05/26/2020   Influenza Split 04/22/2011   Influenza, High Dose Seasonal PF 04/17/2018   Influenza,inj,Quad PF,6+ Mos 06/03/2013, 06/10/2014, 07/15/2016, 04/12/2017   PFIZER Comirnaty(Gray Top)Covid-19 Tri-Sucrose Vaccine 02/27/2021   PFIZER(Purple Top)SARS-COV-2 Vaccination 09/14/2019, 10/05/2019, 02/27/2021   Pneumococcal Conjugate-13 06/10/2014   Pneumococcal Polysaccharide-23 05/24/2019   Tdap 06/22/2012   Unspecified SARS-COV-2 Vaccination 09/14/2019, 10/05/2019, 07/28/2020    Conditions to be addressed/monitored:  Hyperlipidemia, Coronary Artery Disease, GERD, Hypothyroidism and Osteoarthritis, history of PE, Chronic constipation  There are no care plans that you recently modified to display for this patient.    Medication Assistance:  None needed  Compliance/Adherence/Medication fill history: Care Gaps: Mammogram (due 08/11/18) DEXA scan  (never done) Cologuard (due 06/01/20)  Star-Rating Drugs: None  Patient's preferred pharmacy is:  [id number] [address] [address]Phone: [phone]:: [phone]  Uses pill box? Yes Pt endorses 100% compliance  We discussed: Current pharmacy is preferred with insurance plan and patient is satisfied with pharmacy services Patient decided to: Continue current medication management strategy  Care Plan and Follow Up Patient Decision:  Patient agrees to Care Plan and Follow-up.  Plan: Telephone follow up appointment with care management team member scheduled for:  3 months ***

## 2021-08-16 ENCOUNTER — Ambulatory Visit (INDEPENDENT_AMBULATORY_CARE_PROVIDER_SITE_OTHER): Payer: Medicare HMO | Admitting: Licensed Clinical Social Worker

## 2021-08-16 DIAGNOSIS — I890 Lymphedema, not elsewhere classified: Secondary | ICD-10-CM

## 2021-08-16 DIAGNOSIS — Z599 Problem related to housing and economic circumstances, unspecified: Secondary | ICD-10-CM

## 2021-08-16 DIAGNOSIS — E039 Hypothyroidism, unspecified: Secondary | ICD-10-CM

## 2021-08-16 NOTE — Chronic Care Management (AMB) (Signed)
Chronic Care Management  Clinical Social Work Note  08/16/2021 Name: [name] MRN: [id number] DOB: 05-31-1956  [name] is a 66 y.o. year old female who is a primary care patient of Burns, Claudina Lick, MD. The CCM team was consulted for assistance with care coordination needs: [employer]  and stress     Engaged with patient by telephone for follow up visit in response to provider referral for social care coordination services. Assessed patient's current treatment, progress, coping skills, support system and barriers to care.  She continues to maintain positive progress with care plan goals. LCSW has provided patient with all available resources .   Consent to Services:  The patient was given information about Chronic Care Management services, agreed to services, and gave verbal consent prior to initiation of services.  Please see initial visit note for detailed documentation.   Assessment includes: Review of patient past medical history, allergies, medications, and health status, including review of pertinent consultant reports was performed as part of comprehensive evaluation and provision of care management/care coordination services.See Care Plan below for interventions and patient self-care actives.  SDOH (Social Determinants of Health) screening and interventions performed today:  SDOH Interventions    Flowsheet Row Most Recent Value  SDOH Interventions   Housing Interventions Other (Comment)  [provided phone number to [employer],  Did not want a formal referral]  Stress Interventions Provide Counseling       Advanced Directives Status:Not addressed in this encounter.     CCM Care Plan    Conditions to be addressed/monitored per PCP order:  stress , Housing barriers  Care Plan : LCSW Plan of Care  Updates made by [name], LCSW since 08/16/2021 12:00 AM     Problem: Home and Family Safety (Wellness)   Priority: High     Goal: Manage  stress by obtaining new housing   Start Date: 06/10/2021  Expected End Date: 09/06/2021  This Visit's Progress: On track  Recent Progress: On track  Priority: High  Note:   Current Barriers:  Housing   CSW Clinical Goal(s):  Patient  will work with housing agencies discussed today to address needs related to obtaining new housing  through collaboration with [employer] representative, provider, and care team.   Interventions: 1:1 collaboration with primary care provider regarding development and update of comprehensive plan of care as evidenced by provider attestation and co-signature Inter-disciplinary care team collaboration (see longitudinal plan of care) Evaluation of current treatment plan related to  self management and patient's adherence to plan as established by provider Review resources, discussed options and provided patient information about  Housing resources (with Sec. 8 voucher) Discussed possible counseling - patient declined at this time   Social Determinants of Health in Patient with CAD, HLD, and GERD:  (Status: Goal on Track (progressing): YES. Goal Not Met. Patient is very resourceful, she has made many calls and connect with landlords in the community, has all needed information and resources, housing options continue to be limited in [facility].  Housing is stable as she continue to live with her friend.) SDOH assessments completed: Housing stress Evaluation of current treatment plan related to Stress at home / Adjustment reaction  Provided contact information for Affordable Housing Management:  [phone]  and [employer]: [phone]    Declined referral via [place]:  (Status: Goal on Track (progressing): YES. Patient declined further engagement on this goal.) Evaluation of current treatment plan related to  Stress Active listening / Reflection utilized  Emotional Support Provided Participation in counseling encouraged   Provided EMMI education information on Relieving Stress  Patient Self-Care Activities: Follow up with [place]  and  Affordable Housing Management:  [phone]    Follow up Plan:  Patient does not require continued follow-up by CCM LCSW. Will contact the office if needed Patient may benefit from and is in agreement for CCM LCSW to remain part of care team for the next 30 days.  If no needs are identified in the next 30 days, CCM LSCW will disconnect from the care team.  Casimer Lanius, LCSW Licensed Clinical Social Worker [facility]  [place]  [phone]

## 2021-08-16 NOTE — Patient Instructions (Signed)
Visit Information  Thank you for taking time to visit with me today. Please don't hesitate to contact me if I can be of assistance to you before our next scheduled telephone appointment.  Following are the goals we discussed today: housing resources Patient Self-Care Activities: Follow up with [place]  and  Affordable Housing Management:  [phone]   Please call the care guide team at [phone] if you need to cancel, make or reschedule an appointment.   If you are experiencing a Mental Health or [place] or need someone to talk to, please call the [place] National Suicide Prevention Lifeline: (657) 538-1031 or TTY: 905-017-5112 TTY (321)888-4907) to talk to a trained counselor call 1-800-273-TALK (toll free, 24 hour hotline)   Patient verbalizes understanding of instructions provided today and agrees to view in [place].   Please call the office if needed No follow up scheduled, per our conversation you do not require continued follow up Per our conversation I will remain part of your care team for the next 30 days.  If no needs are identified in the next 30 days, I will disconnect from the care team.  Casimer Lanius, [place] [facility]  [phone]

## 2021-08-17 ENCOUNTER — Ambulatory Visit (INDEPENDENT_AMBULATORY_CARE_PROVIDER_SITE_OTHER): Payer: Medicare HMO

## 2021-08-17 DIAGNOSIS — Z7901 Long term (current) use of anticoagulants: Secondary | ICD-10-CM | POA: Diagnosis not present

## 2021-08-17 LAB — POCT INR: INR: 5.3 — AB (ref 2.0–3.0)

## 2021-08-17 NOTE — Patient Instructions (Addendum)
Pre visit review using our clinic review tool, if applicable. No additional management support is needed unless otherwise documented below in the visit note.  Hold dose today and tomorrow and take normal dose on Thurs and then recheck on Fri morning. If any signs or symptoms of bleeding or abnormal bruising to contact the office or go to the ER.

## 2021-08-17 NOTE — Progress Notes (Signed)
Pt reports she noticed when she opened her case that holds her INR machine today that the machine was on. She also noticed when she was done testing that the battery icon came on and she is not sure if the batteries in the machine are low. Contacted pt and advised to change batteries and retest.  Pt retested and the result was still 5.3 Inquired if pt knew of any changes that would effect her INR. PT reported she just finished Keflex. Advised this can interact and if she is placed on antibiotics to contact the coumadin clinic immediately for dosing instructions. Hold dose today and tomorrow and take normal dose on Thurs and then recheck on Fri morning. If any signs or symptoms of bleeding or abnormal bruising to contact the office or go to the ER. Pt verbalized understanding and denied any current bleeding.

## 2021-08-18 ENCOUNTER — Ambulatory Visit: Payer: Self-pay | Admitting: Licensed Clinical Social Worker

## 2021-08-18 DIAGNOSIS — E782 Mixed hyperlipidemia: Secondary | ICD-10-CM

## 2021-08-18 DIAGNOSIS — F439 Reaction to severe stress, unspecified: Secondary | ICD-10-CM

## 2021-08-18 NOTE — Chronic Care Management (AMB) (Signed)
Chronic Care Management    Clinical Social Work Note  08/18/2021 Name: [name] MRN: [id number] DOB: March 27, 1956  [name] is a 66 y.o. year old female who is a primary care patient of Burns, Claudina Lick, MD. The CCM team was consulted to assist the patient with chronic disease management and/or care coordination needs related to:  Connecting for counseling .   Engaged with patient by telephone for follow up visit in response to provider referral for social work chronic care management and care coordination services.   Summary: Assessed patient's current treatment, progress, coping skills, support system and barriers to care.  She continues to experience difficulty with managing stressors..  See Care Plan below for interventions and patient self-care actives.  Recommendation: Patient may benefit from, and is in agreement to allow LCSW to make referral for counseling.   Follow up Plan:  Patient would like continued follow-up from CCM LCSW.  per patient's request will follow up in 4 weeks.  Will call office if needed prior to next encounter. CCM LCSW will continue to collaborate with [facility] in order to meet patient's needs .    Consent to Services:  The patient was given information about Chronic Care Management services, agreed to services, and gave verbal consent prior to initiation of services.  Please see initial visit note for detailed documentation.   Patient agreed to services and consent obtained.   Assessment: Review of patient past medical history, allergies, medications, and health status, including review of relevant consultants reports was performed today as part of a comprehensive evaluation and provision of chronic care management and care coordination services.     SDOH (Social Determinants of Health) assessments and interventions performed:    Advanced Directives Status: Not addressed in this encounter.  CCM Care Plan   Conditions to be  addressed/monitored:  Stress ;   Care Plan : LCSW Plan of Care  Updates made by [name], LCSW since 08/18/2021 12:00 AM     Problem: Home and Family Safety (Wellness)   Priority: High     Goal: Manage stress by connecting with therapist   Start Date: 06/10/2021  Expected End Date: 09/06/2021  This Visit's Progress: On track  Recent Progress: On track  Priority: High  Note:   Current Barriers:  Housing   CSW Clinical Goal(s):  Patient  will work with housing agencies discussed today to address needs related to obtaining new housing  through collaboration with [employer] representative, provider, and care team.   Interventions: 1:1 collaboration with primary care provider regarding development and update of comprehensive plan of care as evidenced by provider attestation and co-signature Inter-disciplinary care team collaboration (see longitudinal plan of care) Evaluation of current treatment plan related to  self management and patient's adherence to plan as established by provider Review resources, discussed options and provided patient information about  Housing resources (with Sec. 8 voucher) Discussed possible counseling - patient declined at this time   Social Determinants of Health in Patient with CAD, HLD, and GERD:  (Status: Goal on Track (progressing): YES. Goal Not Met. Patient is very resourceful, she has made many calls and connect with landlords in the community, has all needed information and resources, housing options continue to be limited in [facility].  Housing is stable as she continue to live with her friend.) SDOH assessments completed: Housing stress Evaluation of current treatment plan related to Stress at home / Adjustment reaction  Provided contact information for [employer]  Management:  [phone]  and [employer]: [phone]    Declined referral via [place]:  (Status: Goal on Track (progressing): YES.)  Patient has decided she would like to participate in counseling Evaluation of current treatment plan related to  Stress Solution-Focused Strategies employed: with therapy options Active listening / Reflection utilized  Emotional Support Provided Provided EMMI education information on Relieving Stress Made referral to [facility]  Patient Self-Care Activities: Follow up with [place]  and  Affordable Housing Management:  [phone] I have placed a referral to [facility] Please look for an e-mail from [place] for your Relieving Stress video    Casimer Lanius, [place] Licensed Clinical Social Worker [facility]  [place]  [phone]

## 2021-08-18 NOTE — Patient Instructions (Addendum)
Visit Information  Thank you for taking time to visit with me today. Please don't hesitate to contact me if I can be of assistance to you before our next scheduled telephone appointment.  Following are the goals we discussed today: Getting you connected for counseling Patient Self-Care Activities: Follow up with [place]  and  Affordable Housing Management:  [phone] I have placed a referral to [facility] Please look for an e-mail from [place] for your Relieving Stress video  Our next appointment is by telephone on Feb 7th at 1:30  Please call the care guide team at [phone] if you need to cancel or reschedule your appointment.   If you are experiencing a Mental Health or [place] or need someone to talk to, please call the Suicide and Crisis Lifeline: 988 call the [place] National Suicide Prevention Lifeline: 819 164 4807 or TTY: [phone] TTY [phone]) to talk to a trained counselor call 1-800-273-TALK (toll free, 24 hour hotline)   Patient verbalizes understanding of instructions and care plan provided today and agrees to view in [place]. Active MyChart status confirmed with patient.    Casimer Lanius, LCSW Licensed Clinical Social Worker [facility]  [place]  [phone]

## 2021-08-20 ENCOUNTER — Ambulatory Visit (INDEPENDENT_AMBULATORY_CARE_PROVIDER_SITE_OTHER): Payer: Medicare HMO

## 2021-08-20 DIAGNOSIS — Z7901 Long term (current) use of anticoagulants: Secondary | ICD-10-CM | POA: Diagnosis not present

## 2021-08-20 LAB — POCT INR: INR: 2.5 (ref 2.0–3.0)

## 2021-08-20 NOTE — Progress Notes (Signed)
Continue 1 1/2 tablets daily except take 1 tablet on Sundays and Thursdays. Recheck in 1 week.  Pt has been stable on prior dosing for many months. Returning dosing to prior dosing before 5.3 result.

## 2021-08-20 NOTE — Patient Instructions (Addendum)
Pre visit review using our clinic review tool, if applicable. No additional management support is needed unless otherwise documented below in the visit note.  Continue 1 1/2 tablets daily except take 1 tablet on Sundays and Thursdays. Recheck in 1 week.  Pt has been stable on prior dosing for many months. Returning dosing to prior dosing before 5.3 result.

## 2021-08-26 ENCOUNTER — Other Ambulatory Visit: Payer: Self-pay | Admitting: Internal Medicine

## 2021-09-02 ENCOUNTER — Other Ambulatory Visit: Payer: Self-pay | Admitting: *Deleted

## 2021-09-02 DIAGNOSIS — Z87891 Personal history of nicotine dependence: Secondary | ICD-10-CM

## 2021-09-03 ENCOUNTER — Ambulatory Visit (INDEPENDENT_AMBULATORY_CARE_PROVIDER_SITE_OTHER): Payer: Medicare HMO

## 2021-09-03 DIAGNOSIS — Z7901 Long term (current) use of anticoagulants: Secondary | ICD-10-CM | POA: Diagnosis not present

## 2021-09-03 LAB — POCT INR: INR: 3.5 — AB (ref 2.0–3.0)

## 2021-09-03 NOTE — Patient Instructions (Addendum)
Pre visit review using our clinic review tool, if applicable. No additional management support is needed unless otherwise documented below in the visit note.  Hold dose today and then change weekly dose to take 1 1/2 tablets daily except take 1 tablet on Mondays, Wednesdays and Fridays. Recheck in 1 week.

## 2021-09-03 NOTE — Progress Notes (Signed)
Hold dose today and then change weekly dose to take 1 1/2 tablets daily except take 1 tablet on Mondays, Wednesdays and Fridays. Recheck in 1 week.

## 2021-09-06 DIAGNOSIS — L89309 Pressure ulcer of unspecified buttock, unspecified stage: Secondary | ICD-10-CM | POA: Diagnosis not present

## 2021-09-06 DIAGNOSIS — Z6841 Body Mass Index (BMI) 40.0 and over, adult: Secondary | ICD-10-CM | POA: Diagnosis not present

## 2021-09-06 DIAGNOSIS — N8110 Cystocele, unspecified: Secondary | ICD-10-CM | POA: Diagnosis not present

## 2021-09-06 DIAGNOSIS — K439 Ventral hernia without obstruction or gangrene: Secondary | ICD-10-CM | POA: Diagnosis not present

## 2021-09-06 DIAGNOSIS — R32 Unspecified urinary incontinence: Secondary | ICD-10-CM | POA: Diagnosis not present

## 2021-09-06 DIAGNOSIS — Z124 Encounter for screening for malignant neoplasm of cervix: Secondary | ICD-10-CM | POA: Diagnosis not present

## 2021-09-06 DIAGNOSIS — R87610 Atypical squamous cells of undetermined significance on cytologic smear of cervix (ASC-US): Secondary | ICD-10-CM | POA: Diagnosis not present

## 2021-09-07 DIAGNOSIS — E782 Mixed hyperlipidemia: Secondary | ICD-10-CM

## 2021-09-07 DIAGNOSIS — E039 Hypothyroidism, unspecified: Secondary | ICD-10-CM

## 2021-09-07 LAB — HM MAMMOGRAPHY

## 2021-09-09 ENCOUNTER — Telehealth: Payer: Self-pay | Admitting: Internal Medicine

## 2021-09-09 ENCOUNTER — Ambulatory Visit (INDEPENDENT_AMBULATORY_CARE_PROVIDER_SITE_OTHER): Payer: Medicare HMO

## 2021-09-09 DIAGNOSIS — R87619 Unspecified abnormal cytological findings in specimens from cervix uteri: Secondary | ICD-10-CM | POA: Insufficient documentation

## 2021-09-09 DIAGNOSIS — Z7901 Long term (current) use of anticoagulants: Secondary | ICD-10-CM

## 2021-09-09 LAB — POCT INR: INR: 2.9 (ref 2.0–3.0)

## 2021-09-09 NOTE — Telephone Encounter (Signed)
Pt called to report her GYN, Dr. Royston Sinner at [facility], just received her PAP results and is wanting to schedule a colposcopy as soon as time will allow. She reported Dr. Royston Sinner does not have an opening until the end of Feb but she is going to try to get her in sooner. She requested pt hold coumadin for 3 days. Advised coumadin clinic will talk with PCP and contact her next week with dosing instructions.

## 2021-09-09 NOTE — Telephone Encounter (Signed)
Yes, lets bridge. Thank you

## 2021-09-09 NOTE — Patient Instructions (Addendum)
Pre visit review using our clinic review tool, if applicable. No additional management support is needed unless otherwise documented below in the visit note.  Continue 1 1/2 tablets daily except take 1 tablet on Mondays, Wednesdays and Fridays. Recheck in 2 week.

## 2021-09-09 NOTE — Telephone Encounter (Signed)
Pt requesting a call back regarding  warfarin (COUMADIN) 5 MG tablet medication

## 2021-09-09 NOTE — Progress Notes (Signed)
Continue 1 1/2 tablets daily except take 1 tablet on Mondays, Wednesdays and Fridays. Recheck in 2 week.

## 2021-09-12 ENCOUNTER — Encounter: Payer: Self-pay | Admitting: Internal Medicine

## 2021-09-13 NOTE — Telephone Encounter (Signed)
Will create bridge when date for surgery has been set due to any fluctuations in warfarin dosing.  Actual Wt: 143 kg Adjusted Body Wt: 92kg; used to calculate CrCl= 100.67mL/min Diagnosis: PE Lovenox,150mg  BID  Contacted pt and advised of need for lovenox bridge. When ready pt would like the script sent to [facility], on [address]. Advised she is already supposed to test on 2/16. At that time this nurse will call her with the dosing instructions. Pt appreciative and verbalized understanding.

## 2021-09-14 ENCOUNTER — Ambulatory Visit (INDEPENDENT_AMBULATORY_CARE_PROVIDER_SITE_OTHER): Payer: Medicare HMO | Admitting: Licensed Clinical Social Worker

## 2021-09-14 DIAGNOSIS — F439 Reaction to severe stress, unspecified: Secondary | ICD-10-CM

## 2021-09-14 DIAGNOSIS — I2510 Atherosclerotic heart disease of native coronary artery without angina pectoris: Secondary | ICD-10-CM

## 2021-09-14 DIAGNOSIS — E039 Hypothyroidism, unspecified: Secondary | ICD-10-CM

## 2021-09-14 NOTE — Chronic Care Management (AMB) (Signed)
Chronic Care Management   Clinical Social Work Note  09/14/2021 Name: [name] MRN: [id number] DOB: [birth year]  [name] is a 66 y.o. year old female who is a primary care patient of Burns, Carmen Lick, MD. The CCM team was consulted to assist the patient with chronic disease management and/or care coordination needs related to: Mental Health Counseling and Resources.   Engaged with patient by telephone for follow up visit in response to provider referral for social work chronic care management and care coordination services.   Consent to Services:  The patient was given information about Chronic Care Management services, agreed to services, and gave verbal consent prior to initiation of services.  Please see initial visit note for detailed documentation.  Patient agreed to services and consent obtained.   Summary:  Patient is making progress with managing stressor, has therapy appointment with [facility] Feb 8th . Will see Dr. Michail Sermon for therapy.  See Care Plan below for interventions and patient self-care actives.   Follow up Plan: Patient would like continued follow-up from CCM LCSW.  per patient's request will follow up in 30 days.  Will call office if needed prior to next encounter.   Assessment: Review of patient past medical history, allergies, medications, and health status, including review of relevant consultants reports was performed today as part of a comprehensive evaluation and provision of chronic care management and care coordination services.     SDOH (Social Determinants of Health) assessments and interventions performed:  SDOH Interventions    Flowsheet Row Most Recent Value  SDOH Interventions   SDOH Interventions for the Following Domains Stress  Stress Interventions Provide Counseling  [has appointment with [facility]]        Advanced Directives Status: Not addressed in this encounter.  CCM Care Plan Conditions to be  addressed/monitored:  stress reaction  ; Housing barriers  Care Plan : LCSW Plan of Care  Updates made by [name], LCSW since 09/14/2021 12:00 AM     Problem: managing mental health   Priority: High     Goal: Manage stress by connecting with therapist   Start Date: 06/10/2021  Expected End Date: 12/05/2021  This Visit's Progress: On track  Recent Progress: On track  Priority: High  Note:   Current Barriers:  Housing  Unmet mental health  CSW Clinical Goal(s):  Patient  will work with housing agencies discussed today to address needs related to obtaining new housing  through collaboration with [place] representative, provider, and care team.   Interventions: 1:1 collaboration with primary care provider regarding development and update of comprehensive plan of care as evidenced by provider attestation and co-signature Inter-disciplinary care team collaboration (see longitudinal plan of care) Evaluation of current treatment plan related to  self management and patient's adherence to plan as established by provider Review resources, discussed options and provided patient information about  Housing resources (has Field seism[employer]. 8 voucher; continues to live with friend) Discussed possible counseling - willing to connect  Social Determinants of Health in Patient with CAD, HLD, and GERD:  (Status: Goal on Track (progressing): YES. Housing is stable as she continue to live with her friend.) followed up with [employer] and [employer] no options at this time) SDOH assessments completed: Housing stress Evaluation of current treatment plan related to Stress at home / Adjustment reaction  Provided contact information for Affordable Housing Management:  [phone]  and [employer]: [place] referral via  [facility]   Mental Health:  (Status: Goal on Track (progressing): YES.) Has therapy appointment Feb. 8th  Evaluation of current treatment  plan related to  Stress Solution-Focused Strategies employed:  Active listening / Reflection utilized  Emotional Support Provided Provided EMMI education information on Relieving Stress  Patient Self-Care Activities: Keep appoint with [facility] on Feb. 8th  Please look for an e-mail from [place] for your Relieving Stress video      Casimer Lanius, [place] Licensed Clinical Social Worker Warehouse manager [facility]  [phone]

## 2021-09-14 NOTE — Patient Instructions (Addendum)
Visit Information  Thank you for taking time to visit with me today. Please don't hesitate to contact me if I can be of assistance to you before our next scheduled telephone appointment.  Following are the goals we discussed today: Connecting for therapy and managing stress Patient Self-Care Activities: Keep appoint with [facility] on Feb. 8th  Please look for an e-mail from [facility] for your Relieving Stress video  Our next appointment is by telephone on March 7th  at 1:30  Please call the care guide team at [phone] if you need to cancel or reschedule your appointment.   If you are experiencing a Mental Health or [place] or need someone to talk to, please call 1-800-273-TALK (toll free, 24 hour hotline) go to [facility] [address], [place] [phone])   Patient verbalizes understanding of instructions and care plan provided today and agrees to view in [place]. Active MyChart status confirmed with patient.    Casimer Lanius, LCSW Licensed Clinical Social Worker [facility]  [place]  [phone]

## 2021-09-15 ENCOUNTER — Ambulatory Visit (INDEPENDENT_AMBULATORY_CARE_PROVIDER_SITE_OTHER): Payer: Medicare HMO | Admitting: Psychologist

## 2021-09-15 DIAGNOSIS — F331 Major depressive disorder, recurrent, moderate: Secondary | ICD-10-CM | POA: Diagnosis not present

## 2021-09-15 MED ORDER — ENOXAPARIN SODIUM 150 MG/ML IJ SOSY: 150.0000 mg | PREFILLED_SYRINGE | Freq: Two times a day (BID) | INTRAMUSCULAR | 0 refills | Status: DC

## 2021-09-15 NOTE — Telephone Encounter (Signed)
Looks good. Thank you.

## 2021-09-15 NOTE — Telephone Encounter (Signed)
Sent in lovenox.

## 2021-09-15 NOTE — Telephone Encounter (Signed)
Per pt, the colposcopy is scheduled for 2/28.    Dosing schedule  2/25: Take last dose of coumadin 2/26: NO coumadin, NO lovenox 2/27: NO coumadin, Lovenox AM ONLY(before 7am)  2/28: SURGERY: NO COUMADIN, NO LOVENOX  3/1: Take 1 1/2 tablets coumadin(7.5 mg), Lovenox every 12 hours 3/2: Take 2 1/2 tablets coumadin(12.5 mg), Lovenox every 12 hours 3/3: Hold coumadin and lovenox until you check INR   Pt requested to check INR on Friday to see if she could stop lovenox at that time. Advised it will normally take longer to build warfarin to therapeutic range, but  pt still requested to test on Friday, 3/3. She will check in the AM so adjustments can be made to dosing if needed and more lovenox can be called in if necessary.  Lovenox, 150 mg, Q12H

## 2021-09-15 NOTE — Plan of Care (Signed)
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
## 2021-09-15 NOTE — Progress Notes (Signed)
                Jarrod Bodkins, PsyD 

## 2021-09-21 ENCOUNTER — Ambulatory Visit: Payer: Medicare HMO

## 2021-09-21 DIAGNOSIS — I2510 Atherosclerotic heart disease of native coronary artery without angina pectoris: Secondary | ICD-10-CM

## 2021-09-21 DIAGNOSIS — Z7901 Long term (current) use of anticoagulants: Secondary | ICD-10-CM

## 2021-09-21 DIAGNOSIS — E039 Hypothyroidism, unspecified: Secondary | ICD-10-CM

## 2021-09-21 DIAGNOSIS — G8929 Other chronic pain: Secondary | ICD-10-CM

## 2021-09-21 NOTE — Patient Instructions (Signed)
Visit Information  Following are the goals we discussed today:   Manage My Medicine   Timeframe:  Long-Range Goal Priority:  Medium Start Date:   11/30/20                          Expected End Date:    06/06/22                 Follow Up Date June 2023   - call for medicine refill 2 or 3 days before it runs out - call if I am sick and can't take my medicine - keep a list of all the medicines I take; vitamins and herbals too - use a pillbox to sort medicine  -Get Shingrix vaccine   Why is this important?   These steps will help you keep on track with your medicines.  Plan: Telephone follow up appointment with care management team member scheduled for:  4 months The patient has been provided with contact information for the care management team and has been advised to call with any health related questions or concerns.   Tomasa Blase, PharmD Clinical Pharmacist, [facility]   Please call the care guide team at [phone] if you need to cancel or reschedule your appointment.   Patient verbalizes understanding of instructions and care plan provided today and agrees to view in [place]. Active MyChart status confirmed with patient.

## 2021-09-21 NOTE — Progress Notes (Signed)
Chronic Care Management Pharmacy Note  09/21/2021 Name:  [name] MRN:  [id number] DOB:  [birth year]  Summary: -Patient reports compliance with current medications, denies any issues or concerns  -Reports that pain overall has been well controlled, occasionally has a pain in her left arm - feels as if she has been punched - has follow up with Dr. Marcell Barlow (Pain Management) next month -working with care guide about finding housing - currently living with friend per notes   Recommendations/Changes made from today's visit: -Recommending no changes to medications - patient planning to make annual appointment with PCP - patient open to possible retrial of statin should LDL be elevated with next check  -Patient educated to complete shingrix vaccination    Subjective: [name] is an 66 y.o. year old female who is a primary patient of Burns, Claudina Lick, MD.  The CCM team was consulted for assistance with disease management and care coordination needs.    Engaged with patient by telephone for follow up visit in response to provider referral for pharmacy case management and/or care coordination services.   Consent to Services:  The patient was given information about Chronic Care Management services, agreed to services, and gave verbal consent prior to initiation of services.  Please see initial visit note for detailed documentation.   Patient Care Team: Binnie Rail, MD as PCP - General (Internal Medicine) Wayland Salinas, MD as Referring Physician (Neurosurgery) Newt Minion, MD as Consulting Physician (Orthopedic Surgery) Myrlene Broker, MD as Attending Physician (Urology) Loletha Carrow Kirke Corin, MD as Consulting Physician (Gastroenterology) [name], LCSW as Social Worker (Licensed Clinical Social Worker)   Patient lives alone, she does not drive anymore. Her daughter passed away recently. She uses uber/lyft to get around or she has 2 friends who can drive her places  occasionally.   "If they don't deliver it, I don't eat it"; she uses grocery delivery from [place].  Has several grandchildren, local and in [place].  Recent office visits: None since last visit   Recent consult visits: 09/06/2021 - Dr. Royston Sinner - OB/GYN - note not available   Hospital visits: 07/25/2021 - ED visit - UTI , and left shoulder and hip strain - prescribed cephalexin, gabapentin, and lidocaine patches   Objective:  Lab Results  Component Value Date   CREATININE 0.79 07/25/2021   BUN 5 (L) 07/25/2021   GFR 90.00 05/26/2020   GFRNONAA >60 07/25/2021   GFRAA >60 07/14/2016   NA 136 07/25/2021   K 3.1 (L) 07/25/2021   CALCIUM 8.6 (L) 07/25/2021   CO2 32 07/25/2021   GLUCOSE 93 07/25/2021    Lab Results  Component Value Date/Time   HGBA1C 5.5 04/17/2018 02:29 PM   HGBA1C 5.5 10/10/2017 03:06 PM   GFR 90.00 05/26/2020 01:58 PM   GFR 83.74 11/25/2019 02:56 PM    Last diabetic Eye exam: No results found for: HMDIABEYEEXA  Last diabetic Foot exam: No results found for: HMDIABFOOTEX   Lab Results  Component Value Date   CHOL 169 05/24/2019   HDL 32.50 (L) 05/24/2019   LDLCALC 69 04/17/2018   LDLDIRECT 105.0 05/24/2019   TRIG 212.0 (H) 05/24/2019   CHOLHDL 5 05/24/2019    Hepatic Function Latest Ref Rng & Units 07/25/2021 05/26/2020 11/25/2019  Total Protein 6.5 - 8.1 g/dL 7.3 7.6 7.4  Albumin 3.5 - 5.0 g/dL 3.3(L) 3.7 3.7  AST 15 - 41 U/L '22 26 19  ' ALT 0 - 44 U/L 15 18  11  Alk Phosphatase 38 - 126 U/L 77 81 71  Total Bilirubin 0.3 - 1.2 mg/dL 1.2 0.6 0.6  Bilirubin, Direct 0.0 - 0.3 mg/dL - - -    Lab Results  Component Value Date/Time   TSH 1.87 03/05/2021 11:22 AM   TSH 9.52 (H) 05/26/2020 01:58 PM   FREET4 0.56 (L) 07/13/2016 09:24 PM   FREET4 0.69 07/24/2014 10:14 AM    CBC Latest Ref Rng & Units 07/25/2021 05/26/2020 11/25/2019  WBC 4.0 - 10.5 K/uL 6.6 5.5 4.5  Hemoglobin 12.0 - 15.0 g/dL 13.2 12.5 12.6  Hematocrit 36.0 - 46.0 % 41.1 38.0 38.4   Platelets 150 - 400 K/uL 142(L) 161.0 170.0    No results found for: VD25OH  Clinical ASCVD: Yes  The 10-year ASCVD risk score (Arnett DK, et al., 2019) is: 6.1%   Values used to calculate the score:     Age: 54 years     Sex: Female     Is Non-Hispanic African American: No     Diabetic: No     Tobacco smoker: No     Systolic Blood Pressure: 161 mmHg     Is BP treated: No     HDL Cholesterol: 32.5 mg/dL     Total Cholesterol: 169 mg/dL    Depression screen [facility] 2/9 09/29/2020 05/26/2020 04/17/2018  Decreased Interest 0 1 2  Down, Depressed, Hopeless 0 0 2  PHQ - 2 Score 0 1 4  Altered sleeping - 1 0  Tired, decreased energy - 1 2  Change in appetite - 0 1  Feeling bad or failure about yourself  - 0 1  Trouble concentrating - 0 0  Moving slowly or fidgety/restless - 0 0  Suicidal thoughts - 0 0  PHQ-9 Score - 3 8  Difficult doing work/chores - Somewhat difficult Not difficult at all  Some recent data might be hidden    Lab Results  Component Value Date/Time   INR 2.9 09/09/2021 12:00 AM   INR 3.5 (A) 09/03/2021 12:00 AM   INR 2.4 (H) 10/10/2016 04:10 PM   INR 4.7 (H) 08/11/2016 11:57 AM   INR 2.1 09/30/2010 03:50 PM   INR 3.8 08/17/2010 02:12 PM    Social History   Tobacco Use  Smoking Status Former   Packs/day: 0.10   Years: 27.00   Pack years: 2.70   Types: Cigarettes   Quit date: 06/09/2011   Years since quitting: 10.2  Smokeless Tobacco Never   BP Readings from Last 3 Encounters:  07/25/21 132/60  03/05/21 126/80  06/08/20 118/76   Pulse Readings from Last 3 Encounters:  07/25/21 (!) 58  03/05/21 (!) 58  05/26/20 80   Wt Readings from Last 3 Encounters:  07/25/21 (!) 315 lb (142.9 kg)  06/08/20 (!) 315 lb (142.9 kg)  08/23/19 (!) 331 lb 3.2 oz (150.2 kg)   BMI Readings from Last 3 Encounters:  07/25/21 52.42 kg/m  03/05/21 52.42 kg/m  06/08/20 52.42 kg/m    Assessment/Interventions: Review of patient past medical history, allergies,  medications, health status, including review of consultants reports, laboratory and other test data, was performed as part of comprehensive evaluation and provision of chronic care management services.   SDOH:  (Social Determinants of Health) assessments and interventions performed: Yes  SDOH Screenings   Alcohol Screen: Low Risk    Last Alcohol Screening Score (AUDIT): 0  Depression (PHQ2-9): Low Risk    PHQ-2 Score: 0  Financial Resource Strain: Low Risk  Difficulty of Paying Living Expenses: Not hard at all  Food Insecurity: No Food Insecurity   Worried About Charity fundraiser in the Last Year: Never true   Ran Out of Food in the Last Year: Never true  Housing: Medium Risk   Last Housing Risk Score: 1  Physical Activity: Inactive   Days of Exercise per Week: 0 days   Minutes of Exercise per Session: 0 min  Social Connections: Socially Isolated   Frequency of Communication with Friends and Family: More than three times a week   Frequency of Social Gatherings with Friends and Family: More than three times a week   Attends Religious Services: Never   Marine scientist or Organizations: No   Attends Music therapist: Never   Marital Status: Never married  Stress: Stress Concern Present   Feeling of Stress : Rather much  Tobacco Use: Medium Risk   Smoking Tobacco Use: Former   Smokeless Tobacco Use: Never   Passive Exposure: Not on file  Transportation Needs: No Transportation Needs   Lack of Transportation (Medical): No   Lack of Transportation (Non-Medical): No    CCM Care Plan  Allergies  Allergen Reactions   Amitiza [Lubiprostone] Diarrhea and Nausea And Vomiting   Morphine Anaphylaxis    REACTION: nausea, rash, itching, vomiting   Narcan [Naloxone Hcl] Other (See Comments)    Pt was not her self   Nsaids     Bowel irregularity (coffee grounds)   Venlafaxine Other (See Comments)    itching    Medications Reviewed Today     Reviewed by  Tomasa Blase, [facility] (Pharmacist) on 09/21/21 at 1305  Med List Status: <None>   Medication Order Taking? Sig Documenting Provider Last Dose Status Informant  bisacodyl (DULCOLAX) 5 MG EC tablet [id number] Yes Take 30 mg by mouth daily as needed for moderate constipation. [provider] Taking Active Self  enoxaparin (LOVENOX) 150 MG/ML injection [id number]  Inject 1 mL (150 mg total) into the skin every 12 (twelve) hours. AS DIRECTED BY ANTICOAGULATION CLINIC Burns, Claudina Lick, MD  Active   HYDROmorphone (DILAUDID) 4 MG tablet [id number] Yes TAKE 1 TO 2 TABLETS BY MOUTH EVERY 6 HOURS AS NEEDED FOR [facility] PAIN [provider] Taking Active Self           Med Note (DAVIS, SOPHIA A   Thu Jun 15, 2017  2:47 PM)    levothyroxine (SYNTHROID) 200 MCG tablet [id number] Yes TAKE 1 TABLET BY MOUTH EVERY DAY BEFORE BREAKFAST Burns, Claudina Lick, MD Taking Active   levothyroxine (SYNTHROID) 50 MCG tablet [id number] Yes TAKE 1 TABLET (50 MCG TOTAL) BY MOUTH DAILY BEFORE BREAKFAST. TAKE IN ADDITION TO 200 MCG PILL FOR A TOTAL OF 250 MCG DAILY Burns, Claudina Lick, MD Taking Active   methadone (DOLOPHINE) 10 MG tablet [id number] Yes Take 10 mg by mouth every 8 (eight) hours as needed. for pain [provider] Taking Active Self           Med Note Rosemarie Beath, MELISSA B   Wed Aug 02, 2017  6:37 PM)    RABEprazole (ACIPHEX) 20 MG tablet [id number] Yes Take 20 mg by mouth daily as needed. [provider] Taking Active   vitamin B-12 (CYANOCOBALAMIN) 1000 MCG tablet [id number] Yes Take 1 tablet (1,000 mcg total) by mouth daily. Binnie Rail, MD Taking Active   warfarin (COUMADIN) 5 MG tablet [id number] Yes TAKE 1 AND 1/2 TABLETS DAILY EXCEPT TAKE 1 TABLET  ON SUN AND THURSDAY OR TAKE AS DIRECTED BY ANTICOAGULATION CLINIC Binnie Rail, MD Taking Active             Patient Active Problem List   Diagnosis Date Noted   Pressure injury of sacral region, stage 1 03/05/2021   Tinea corporis  05/26/2020   Lesion of tongue 11/25/2019   Left upper arm pain 10/16/2018   Mixed incontinence 11/10/2017   B12 deficiency 10/12/2017   Microscopic hematuria 10/12/2017   Bilateral primary osteoarthritis of knee 11/18/2016   Female bladder prolapse 10/10/2016   Long term current use of anticoagulant 10/10/2016   Falls frequently 07/14/2016   Syncope 07/14/2016   Hyperlipidemia 06/10/2014   Severe obesity (BMI >= 40) ([place]) 06/10/2014   CAD (coronary artery disease)    Interstitial cystitis 01/31/2013   Chronic narcotic dependence ([place]) 01/30/2013   S/P hip replacement 11/01/2012   Venous insufficiency (chronic) (peripheral)    Atherosclerosis of native arteries of the extremities with ulceration ([place]) 07/21/2011   Esophageal spasm 06/14/2011   Hypothyroidism 01/19/2010   LEG CRAMPS, NOCTURNAL 01/18/2010   Chronic acquired lymphedema 01/18/2010   History of pulmonary embolism 08/10/2007   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/10/2007   LOW BACK PAIN, CHRONIC 08/10/2007   MEDIASTINAL ADENOPATHY CASTLEMANS D 08/10/2007    Immunization History  Administered Date(s) Administered   Fluad Quad(high Dose 65+) 05/24/2019, 05/26/2020   Influenza Split 04/22/2011   Influenza, High Dose Seasonal PF 04/17/2018   Influenza,inj,Quad PF,6+ Mos 06/03/2013, 06/10/2014, 07/15/2016, 04/12/2017   PFIZER Comirnaty(Gray Top)Covid-19 Tri-Sucrose Vaccine 02/27/2021   PFIZER(Purple Top)SARS-COV-2 Vaccination 09/14/2019, 10/05/2019, 02/27/2021   Pneumococcal Conjugate-13 06/10/2014   Pneumococcal Polysaccharide-23 05/24/2019   Tdap 06/22/2012   Unspecified SARS-COV-2 Vaccination 09/14/2019, 10/05/2019, 07/28/2020    Conditions to be addressed/monitored:  Hyperlipidemia, Coronary Artery Disease, GERD, Hypothyroidism and Osteoarthritis, history of PE, Chronic constipation  Care Plan : [place]  Updates made by Tomasa Blase, [place] since 09/21/2021 12:00 AM     Problem: Hyperlipidemia,  Coronary Artery Disease, GERD, Hypothyroidism and Osteoarthritis, history of PE, Chronic constipation   Priority: High     Long-Range Goal: Disease management   Start Date: 11/30/2020  Expected End Date: 05/14/2022  This Visit's Progress: On track  Recent Progress: On track  Priority: High  Note:   Current Barriers:  Unable to independently monitor therapeutic efficacy Suboptimal therapeutic regimen for HLD  Pharmacist Clinical Goal(s):  Patient will verbalize ability to afford treatment regimen achieve adherence to monitoring guidelines and medication adherence to achieve therapeutic efficacy through collaboration with PharmD and provider.   Interventions: 1:1 collaboration with Binnie Rail, MD regarding development and update of comprehensive plan of care as evidenced by provider attestation and co-signature Inter-disciplinary care team collaboration (see longitudinal plan of care) Comprehensive medication review performed; medication list updated in electronic medical record  Hyperlipidemia: (LDL goal < 70) -Hx minimal CAD, PAD w/ venous insufficiency -Not ideally controlled - due to hx of atherosclerosis patient would benefit from statin therapy; she tried atorvastatin previously and had significant myalgias; she is not willing to start a different statin today - planning to schedule annual visit with PCP - open to retrial of statin should LDL remain elevated with next check  Last LDL 105 mg/dL (05/24/2019) -Current treatment: no medication -Medications previously tried: atorvastatin (2012, 2014) -Educated on Benefits of statin for ASCVD risk reduction; Importance of limiting foods high in cholesterol; -Counseled on diet and exercise extensively - would recommend trial of rosuvastatin  should LDL be >70   History of PE Goal: prevent recurrent VTE) -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 Managed per PCP coumadin clinic - home INR machine. Lifelong anticoagulation. -Controlled - INR is at goal;  pt denies s/sx of bleeding or clot -Current treatment  Warfarin 5 mg - 1.5 tab daily except 1 tab Sun/Thurs -Medications previously tried: n/a -Counseled on potential drug interactions with warfarin - NSAIDs, Tylenol -Recommended to continue current medication -Patient reports that she has reviewed lovenox bridge for upcoming procedure with anticoagulation team   Hypothyroidism (Goal: maintain TSH in goal range) -Controlled- repeat TSH is WNL Lab Results  Component Value Date   TSH 1.87 03/05/2021  -Pt takes levothyroxine at 4-5 am, goes back to bed and wakes up at 8-9 -Current treatment  Levothyroxine 200 mcg daily Levothyroxine 50 mcg daily  -Recommend to continue current medication  Chronic pain (Goal: manage pain) -managed per Dr Marcell Barlow @ pain mgmt -bilateral osteoarthritis of knees; low back pain; pt reports 80% of pain comes from lower back -Controlled - per pt she does not use any other OTC pain meds due to interactions with warfarin; she does not use topicals because administration to low back is difficult -Current treatment  Methadone 10 mg  - 2 tab q8h  (#180/month) Hydromorphone 4 mg 1-2 tab q6h prn (#120/month) -Counseled on using medication as prescribed, advised not to take more than prescribed due to risk for respiratory depression/oversedation -Recommended to continue current medication -Follow up with Dr. Marcell Barlow next month    GERD (Goal: manage symptoms) -Controlled - pt reports symptoms are much improved after starting rabeprazole -Current treatment  Rabeprazole 20 mg daily prn  -Recommend to continue current medication  Health Maintenance -Vaccine gaps: Shingrix, Flu -DEXA scan is overdue -Advised patient get Shingrix, Flu vaccine  Patient Goals/Self-Care Activities Patient will:  - take medications as prescribed -focus on medication adherence by pill box        Medication Assistance:  None needed  Care Gaps: Mammogram  DEXA scan  Cologuard   Shingles vaccine Flu vaccine   Star-Rating Drugs: None  Patient's preferred pharmacy is:  [id number] [id number]: [phone]:: [phone] [id number] [place] [id number]: [phone]: [phone] Uses pill box? Yes Pt endorses 100% compliance  We discussed: Current pharmacy is preferred with insurance plan and patient is satisfied with pharmacy services Patient decided to: Continue current medication management strategy  Care Plan and Follow Up Patient Decision:  Patient agrees to Care Plan and Follow-up.  Plan: Telephone follow up appointment with care management team member scheduled for:  4 months  DaTomasa BlasePharmD Clinical Pharmacist, [place]

## 2021-09-22 ENCOUNTER — Ambulatory Visit
Admission: RE | Admit: 2021-09-22 | Discharge: 2021-09-22 | Disposition: A | Discharge status: Home / Self Care | Payer: Medicare HMO | Source: Ambulatory Visit | Attending: Acute Care | Admitting: Acute Care

## 2021-09-22 ENCOUNTER — Other Ambulatory Visit: Payer: Self-pay

## 2021-09-22 DIAGNOSIS — I700 Atherosclerosis of aorta: Secondary | ICD-10-CM | POA: Diagnosis not present

## 2021-09-22 DIAGNOSIS — J432 Centrilobular emphysema: Secondary | ICD-10-CM | POA: Diagnosis not present

## 2021-09-22 DIAGNOSIS — I2510 Atherosclerotic heart disease of native coronary artery without angina pectoris: Secondary | ICD-10-CM | POA: Diagnosis not present

## 2021-09-22 DIAGNOSIS — Z87891 Personal history of nicotine dependence: Secondary | ICD-10-CM | POA: Diagnosis not present

## 2021-09-23 ENCOUNTER — Ambulatory Visit (INDEPENDENT_AMBULATORY_CARE_PROVIDER_SITE_OTHER): Payer: Medicare HMO

## 2021-09-23 DIAGNOSIS — Z7901 Long term (current) use of anticoagulants: Secondary | ICD-10-CM

## 2021-09-23 LAB — POCT INR: INR: 3.4 — AB (ref 2.0–3.0)

## 2021-09-23 NOTE — Progress Notes (Signed)
[employer] result is 3.4 for today's INR. Hold dose today and then change weekly dosing to take 1 tablet daily except take 1 1/2 tablets on Sun, Tues, and Thurs.  Recheck in 1 week.  Contacted pt by phone and advised of dosing instructions. Pt verbalized understanding.

## 2021-09-23 NOTE — Patient Instructions (Addendum)
Pre visit review using our clinic review tool, if applicable. No additional management support is needed unless otherwise documented below in the visit note.  Hold dose today and then change weekly dosing to take 1 tablet daily except take 1 1/2 tablets on Sun, Tues, and Thurs.  Recheck in 1 week.

## 2021-09-27 ENCOUNTER — Telehealth: Payer: Self-pay

## 2021-09-27 ENCOUNTER — Other Ambulatory Visit: Payer: Self-pay

## 2021-09-27 DIAGNOSIS — Z87891 Personal history of nicotine dependence: Secondary | ICD-10-CM

## 2021-09-27 NOTE — Telephone Encounter (Signed)
Pt called wondering when her last dose of coumadin would be before her procedure on 2/28. She reported she could not find the directions on mychart and was worried. Advised pt she is to retest on 2/23 and that is when we can go over the instructions for her bridge. Pt appreciative and voiced understanding.   Dosing schedule   2/25: Take last dose of coumadin 2/26: NO coumadin, NO lovenox 2/27: NO coumadin, Lovenox AM ONLY(before 7am)   2/28: SURGERY: NO COUMADIN, NO LOVENOX   3/1: Take 1 1/2 tablets coumadin(7.5 mg), Lovenox every 12 hours 3/2: Take 2 1/2 tablets coumadin(12.5 mg), Lovenox every 12 hours 3/3: Hold coumadin and lovenox until you check INR

## 2021-09-30 ENCOUNTER — Ambulatory Visit (INDEPENDENT_AMBULATORY_CARE_PROVIDER_SITE_OTHER): Payer: Medicare HMO

## 2021-09-30 DIAGNOSIS — Z7901 Long term (current) use of anticoagulants: Secondary | ICD-10-CM

## 2021-09-30 LAB — POCT INR: INR: 3.6 — AB (ref 2.0–3.0)

## 2021-09-30 NOTE — Patient Instructions (Addendum)
Pre visit review using our clinic review tool, if applicable. No additional management support is needed unless otherwise documented below in the visit note.  Hold dose today and then change weekly dosing to take 1 tablet daily except take 1 1/2 tablets on Sundays.  Recheck in 1 week.

## 2021-09-30 NOTE — Progress Notes (Addendum)
Acelis result is 3.6 for today's INR. Colposcopy is RS for March, 23. A new dosing schedule will be given to pot before procedure.  Hold dose today and then change weekly dosing to take 1 tablet daily except take 1 1/2 tablets on Sundays.  Recheck in 1 week.  Contacted pt by phone and advised of dosing instructions. Pt verbalized understanding.  Patient is using [employer] home monitoring.

## 2021-10-04 ENCOUNTER — Ambulatory Visit (INDEPENDENT_AMBULATORY_CARE_PROVIDER_SITE_OTHER): Payer: Medicare HMO | Admitting: Psychologist

## 2021-10-04 DIAGNOSIS — F331 Major depressive disorder, recurrent, moderate: Secondary | ICD-10-CM | POA: Diagnosis not present

## 2021-10-04 NOTE — Progress Notes (Signed)
[name] Counselor/Therapist Progress Note  Patient ID: [name], MRN: [id number],    Date: 10/04/2021  Time Spent: 2:05 pm to 2:43 pm; total time: 38 minutes   This session was held via video webex teletherapy due to the coronavirus risk at this time. The patient consented to video teletherapy and was located at her home during this session. She is aware it is the responsibility of the patient to secure confidentiality on her end of the session. The provider was in a private home office for the duration of this session. Limits of confidentiality were discussed with the patient.   Treatment Type: Individual Therapy  Reported Symptoms: Depression  Mental Status Exam: Appearance:  Casual     Behavior: Appropriate  Motor: Normal  Speech/Language:  Clear and Coherent  Affect: Appropriate  Mood: normal  Thought process: normal  Thought content:   WNL  Sensory/Perceptual disturbances:   WNL  Orientation: oriented to person, place, and time/date  Attention: Good  Concentration: Good  Memory: WNL  Fund of knowledge:  Good  Insight:   Fair  Judgment:  Fair  Impulse Control: Good   Risk Assessment: Danger to Self:  No Self-injurious Behavior: No Danger to Others: No Duty to Warn:no Physical Aggression / Violence:No  Access to Firearms a concern: No  Gang Involvement:No   Subjective: Beginning the session, the patient described herself as doing well. After reviewing the treatment plan, patient voiced what has been going well for her. Patient spent the session reflecting on where she can get social support from in her life. She processed thoughts and emotions. She denied suicidal and homicidal ideation. She asked to follow up.    Interventions:  Worked on developing a therapeutic relationship with the patient using active listening and reflective statements. Provided emotional support using empathy and validation. Reviewed the treatment plan with the patient.  Reviewed events since the last session. Praised patient for doing better and explored what has assisted the patient. Used socratic questions to assist the patient gain insight into self. Explored level of social support patient has in life. Identified different people that could increase the level of social support. Assisted in problem solving. Processed thoughts and emotions. Assessed for suicidal and homicidal ideation.   Homework: NA  Next Session: Emotional support  Diagnosis: F33.1 major depressive affective disorder, recurrent, moderate  Plan:  Client Abilities: Friendly and easy to develop rapport  Client Preferences: ACT and CBT  Client statement of Needs: Coping skills and emotional support  Treatment Level: Outpatient  Goals Alleviate depressive symptoms Recognize, accept, and cope with depressive feelings Develop healthy thinking patterns Develop healthy interpersonal relationships  Objectives target date for all objectives is 09/15/2022 Cooperate with a medication evaluation by a physician Verbalize an accurate understanding of depression Verbalize an understanding of the treatment Identify and replace thoughts that support depression Learn and implement behavioral strategies Verbalize an understanding and resolution of current interpersonal problems Learn and implement problem solving and decision making skills Learn and implement conflict resolution skills to resolve interpersonal problems Verbalize an understanding of healthy and unhealthy emotions verbalize insight into how past relationships may be influence current experiences with depression Use mindfulness and acceptance strategies and increase value based behavior  Increase hopeful statements about the future.  Interventions Consistent with treatment model, discuss how change in cognitive, behavioral, and interpersonal can help client alleviate depression CBT Behavioral activation help the client explore the  relationship, nature of the dispute,  Help the client develop new interpersonal  skills and relationships Conduct Problem so living therapy Teach conflict resolution skills Use a process-experiential approach Conduct TLDP Conduct ACT Evaluate need for psychotropic medication Monitor adherence to medication   The patient and clinician reviewed the treatment plan on 10/04/2021. The patient approved of the treatment plan.     Conception Chancy, PsyD

## 2021-10-04 NOTE — Progress Notes (Signed)
                Daley Mooradian, PsyD 

## 2021-10-05 DIAGNOSIS — I2510 Atherosclerotic heart disease of native coronary artery without angina pectoris: Secondary | ICD-10-CM

## 2021-10-05 DIAGNOSIS — E039 Hypothyroidism, unspecified: Secondary | ICD-10-CM

## 2021-10-07 ENCOUNTER — Ambulatory Visit (INDEPENDENT_AMBULATORY_CARE_PROVIDER_SITE_OTHER): Payer: Medicare HMO

## 2021-10-07 DIAGNOSIS — Z7901 Long term (current) use of anticoagulants: Secondary | ICD-10-CM | POA: Diagnosis not present

## 2021-10-07 LAB — POCT INR: INR: 2.6 (ref 2.0–3.0)

## 2021-10-07 NOTE — Patient Instructions (Addendum)
Pre visit review using our clinic review tool, if applicable. No additional management support is needed unless otherwise documented below in the visit note. ? ?Continue 1 tablet daily except take 1 1/2 tablets on Sundays.  Recheck in 2 week.  ?

## 2021-10-07 NOTE — Progress Notes (Signed)
Acelis result is 2.6 for today's INR. ?Colposcopy is RS for March, 23. A new dosing schedule will be given to pot before procedure.  ?Continue 1 tablet daily except take 1 1/2 tablets on Sundays.  Recheck in 2 week.  ?Contacted pt by phone and advised of dosing instructions. Pt verbalized understanding.  ?Patient is using [employer] home monitoring.  ?

## 2021-10-07 NOTE — Telephone Encounter (Signed)
Procedure RS for 3/23. ? ?New schedule for new date of procedure below. ? ?  ?3/20: Take last dose of coumadin ?3/21: NO coumadin, NO lovenox ?3/22: NO coumadin, Lovenox AM ONLY(before 7am) ?  ?3/23: SURGERY: NO COUMADIN, NO LOVENOX ?  ?3/24: Take 1 1/2 tablets coumadin(7.5 mg), Lovenox every 12 hours ?3/25: Take 1 1/2 tablets coumadin(12.5 mg), Lovenox every 12 hours ?3/26: No lovenox and restart normal dosing of coumadin. Continue 1 tablet daily except take 1 1/2 tablets on Sundays. ? ?3/30: Recheck INR ?

## 2021-10-11 ENCOUNTER — Ambulatory Visit (INDEPENDENT_AMBULATORY_CARE_PROVIDER_SITE_OTHER): Payer: Medicare HMO

## 2021-10-11 ENCOUNTER — Telehealth: Payer: Self-pay | Admitting: *Deleted

## 2021-10-11 DIAGNOSIS — Z1211 Encounter for screening for malignant neoplasm of colon: Secondary | ICD-10-CM | POA: Diagnosis not present

## 2021-10-11 DIAGNOSIS — Z0000 Encounter for general adult medical examination without abnormal findings: Secondary | ICD-10-CM

## 2021-10-11 DIAGNOSIS — Z780 Asymptomatic menopausal state: Secondary | ICD-10-CM | POA: Diagnosis not present

## 2021-10-11 NOTE — Progress Notes (Signed)
? ? ?Subjective:  ? ? Patient ID: [name], female    DOB: November 30, 1955, 66 y.o.   MRN: [id number] ? ? ?This visit occurred during the SARS-CoV-2 public health emergency.  Safety protocols were in place, including screening questions prior to the visit, additional usage of staff PPE, and extensive cleaning of exam room while observing appropriate contact time as indicated for disinfecting solutions. ? ? ? ?HPI ?[name] is here for  ?Chief Complaint  ?Patient presents with  ? Annual Exam  ? ?She found a place to live.   ? ?Left shoulder/upper arm pain - it started a couple of years ago, worse now.  Lifting her arm and moving are arm makes the pain worse.  She is limited with what she can do with the arm. ? ? ?She is trying to eat more healthy.  She is trying to eliminate some processed foods from her diet. ? ? ? ?Medications and allergies reviewed with patient and updated if appropriate. ? ?Current Outpatient Medications on File Prior to Visit  ?Medication Sig Dispense Refill  ? bisacodyl (DULCOLAX) 5 MG EC tablet Take 30 mg by mouth daily as needed for moderate constipation.    ? dicyclomine (BENTYL) 20 MG tablet dicyclomine 20 mg tablet ? TAKE 1 TABLET (20 MG TOTAL) BY MOUTH EVERY 6 (SIX) HOURS AS NEEDED FOR SPASMS.    ? enoxaparin (LOVENOX) 150 MG/ML injection Inject 1 mL (150 mg total) into the skin every 12 (twelve) hours. AS DIRECTED BY ANTICOAGULATION CLINIC 5 mL 0  ? HYDROmorphone (DILAUDID) 4 MG tablet TAKE 1 TO 2 TABLETS BY MOUTH EVERY 6 HOURS AS NEEDED FOR BREAKTHROUGH PAIN  0  ? levothyroxine (SYNTHROID) 200 MCG tablet TAKE 1 TABLET BY MOUTH EVERY DAY BEFORE BREAKFAST 90 tablet 0  ? levothyroxine (SYNTHROID) 50 MCG tablet TAKE 1 TABLET (50 MCG TOTAL) BY MOUTH DAILY BEFORE BREAKFAST. TAKE IN ADDITION TO 200 MCG PILL FOR A TOTAL OF 250 MCG DAILY 90 tablet 3  ? methadone (DOLOPHINE) 10 MG tablet Take 10 mg by mouth every 8 (eight) hours as needed. for pain  0  ? RABEprazole (ACIPHEX) 20 MG tablet Take 20  mg by mouth daily as needed.    ? vitamin B-12 (CYANOCOBALAMIN) 1000 MCG tablet Take 1 tablet (1,000 mcg total) by mouth daily.    ? warfarin (COUMADIN) 5 MG tablet TAKE 1 AND 1/2 TABLETS DAILY EXCEPT TAKE 1 TABLET ON SUN AND THURSDAY OR TAKE AS DIRECTED BY ANTICOAGULATION CLINIC 120 tablet 1  ? naloxone (NARCAN) nasal spray 4 mg/0.1 mL SMARTSIG:Both Nares    ? ?No current facility-administered medications on file prior to visit.  ? ? ?Review of Systems  ?Constitutional:  Negative for fever.  ?Eyes:  Negative for visual disturbance.  ?Respiratory:  Positive for shortness of breath (with activity). Negative for cough and wheezing.   ?Cardiovascular:  Positive for palpitations (occ - resolves with cough) and leg swelling. Negative for chest pain.  ?Gastrointestinal:  Positive for abdominal pain (from constipation), anal bleeding (hemorrhoids - constipation) and constipation. Negative for blood in stool, diarrhea and nausea.  ?Genitourinary:  Positive for dysuria.  ?     Bladder pressure  ?Musculoskeletal:  Positive for arthralgias and back pain.  ?Skin:  Positive for wound (pressure ulcer - sacral). Negative for rash.  ?Neurological:  Negative for light-headedness and headaches.  ?Psychiatric/Behavioral:  Negative for dysphoric mood. The patient is not nervous/anxious.   ? ?   ?Objective:  ? ?Vitals:  ? 10/12/21  1050  ?BP: 114/68  ?Pulse: 71  ?Temp: 98.4 ?F (36.9 ?C)  ?SpO2: 95%  ? ?There were no vitals filed for this visit. ?Body mass index is 52.42 kg/m?. ? ?BP Readings from Last 3 Encounters:  ?10/12/21 114/68  ?07/25/21 132/60  ?03/05/21 126/80  ? ? ?Wt Readings from Last 3 Encounters:  ?07/25/21 (!) 315 lb (142.9 kg)  ?06/08/20 (!) 315 lb (142.9 kg)  ?08/23/19 (!) 331 lb 3.2 oz (150.2 kg)  ? ? ?Depression screen [facility] 2/9 10/11/2021 10/11/2021 09/29/2020 05/26/2020 04/17/2018  ?Decreased Interest 0 0 0 1 2  ?Down, Depressed, Hopeless 0 0 0 0 2  ?PHQ - 2 Score 0 0 0 1 4  ?Altered sleeping - - - 1 0  ?Tired, decreased  energy - - - 1 2  ?Change in appetite - - - 0 1  ?Feeling bad or failure about yourself  - - - 0 1  ?Trouble concentrating - - - 0 0  ?Moving slowly or fidgety/restless - - - 0 0  ?Suicidal thoughts - - - 0 0  ?PHQ-9 Score - - - 3 8  ?Difficult doing work/chores - - - Somewhat difficult Not difficult at all  ?Some recent data might be hidden  ? ? ? ?No flowsheet data found. ? ? ? ?  ?Physical Exam ?Constitutional:   ?   General: She is not in acute distress. ?   Appearance: Normal appearance. She is not ill-appearing.  ?HENT:  ?   Head: Normocephalic.  ?   Right Ear: External ear normal.  ?   Left Ear: External ear normal.  ?   Mouth/Throat:  ?   Mouth: Mucous membranes are moist.  ?   Pharynx: No posterior oropharyngeal erythema.  ?Eyes:  ?   Conjunctiva/sclera: Conjunctivae normal.  ?Cardiovascular:  ?   Rate and Rhythm: Normal rate and regular rhythm.  ?   Heart sounds: No murmur heard. ?Pulmonary:  ?   Effort: Pulmonary effort is normal. No respiratory distress.  ?   Breath sounds: Normal breath sounds. No wheezing or rales.  ?Abdominal:  ?   General: There is no distension.  ?   Palpations: Abdomen is soft.  ?   Tenderness: There is no abdominal tenderness.  ?Musculoskeletal:  ?   Cervical back: Neck supple. No tenderness.  ?   Right lower leg: Edema (mild edema - chronic skin changes - lymphedema) present.  ?   Left lower leg: Edema (mild edema - chronic skin changes - lymphedema) present.  ?Lymphadenopathy:  ?   Cervical: No cervical adenopathy.  ?Skin: ?   General: Skin is warm and dry.  ?   Findings: No rash.  ?   Comments: Stage 2 pressure ulcer b/l buttock region  ?Neurological:  ?   Mental Status: She is alert. Mental status is at baseline.  ?Psychiatric:     ?   Mood and Affect: Mood normal.  ? ? ? ?Lab Results  ?Component Value Date  ? WBC 6.6 07/25/2021  ? HGB 13.2 07/25/2021  ? HCT 41.1 07/25/2021  ? PLT 142 (L) 07/25/2021  ? GLUCOSE 93 07/25/2021  ? CHOL 169 05/24/2019  ? TRIG 212.0 (H) 05/24/2019   ? HDL 32.50 (L) 05/24/2019  ? LDLDIRECT 105.0 05/24/2019  ? [place] 69 04/17/2018  ? ALT 15 07/25/2021  ? AST 22 07/25/2021  ? NA 136 07/25/2021  ? K 3.1 (L) 07/25/2021  ? CL 99 07/25/2021  ? CREATININE 0.79 07/25/2021  ?  BUN 5 (L) 07/25/2021  ? CO2 32 07/25/2021  ? TSH 1.87 03/05/2021  ? INR 2.6 10/07/2021  ? HGBA1C 5.5 04/17/2018  ? ? ? ? ?   ?Assessment & Plan:  ? ?Physical exam: ?Screening blood work  ordered ?Exercise  encouraged as much activity as possible - can only walk a few steps.   ?Weight  encouraged weight loss ?Substance abuse  none ? ? ?Reviewed recommended immunizations. ? ? ?Health Maintenance  ?Topic Date Due  ? Zoster Vaccines- Shingrix (1 of 2) Never done  ? MAMMOGRAM  08/11/2018  ? DEXA SCAN  Never done  ? Fecal DNA (Cologuard)  06/01/2020  ? TETANUS/TDAP  06/22/2022  ? Pneumonia Vaccine 42+ Years old  Completed  ? INFLUENZA VACCINE  Completed  ? COVID-19 Vaccine  Completed  ? Hepatitis C Screening  Completed  ? HPV VACCINES  Aged Out  ?  ? ? ? ? ? ? ?See Problem List for Assessment and Plan of chronic medical problems. ? ? ? ? ?

## 2021-10-11 NOTE — Patient Instructions (Addendum)
[name] , Thank you for taking time to come for your Medicare Wellness Visit. I appreciate your ongoing commitment to your health goals. Please review the following plan we discussed and let me know if I can assist you in the future.   Screening recommendations/referrals: Colonoscopy: ordered 10/11/2021 Mammogram: ordered 01/31/2023per patient  Bone Density: ordered 10/11/2021 Recommended yearly ophthalmology/optometry visit for glaucoma screening and checkup Recommended yearly dental visit for hygiene and checkup  Vaccinations: Influenza vaccine: completed  Pneumococcal vaccine: completed  Tdap vaccine: 06/22/2012 Shingles vaccine: will consider     Advanced directives: none   Conditions/risks identified: none   Next appointment: none    Preventive Care 60 Years and Older, Female Preventive care refers to lifestyle choices and visits with your health care provider that can promote health and wellness. What does preventive care include? A yearly physical exam. This is also called an annual well check. Dental exams once or twice a year. Routine eye exams. Ask your health care provider how often you should have your eyes checked. Personal lifestyle choices, including: Daily care of your teeth and gums. Regular physical activity. Eating a healthy diet. Avoiding tobacco and drug use. Limiting alcohol use. Practicing safe sex. Taking low-dose aspirin every day. Taking vitamin and mineral supplements as recommended by your health care provider. What happens during an annual well check? The services and screenings done by your health care provider during your annual well check will depend on your age, overall health, lifestyle risk factors, and family history of disease. Counseling  Your health care provider may ask you questions about your: Alcohol use. Tobacco use. Drug use. Emotional well-being. Home and relationship well-being. Sexual activity. Eating habits. History  of falls. Memory and ability to understand (cognition). Work and work Statistician. Reproductive health. Screening  You may have the following tests or measurements: Height, weight, and BMI. Blood pressure. Lipid and cholesterol levels. These may be checked every 5 years, or more frequently if you are over 55 years old. Skin check. Lung cancer screening. You may have this screening every year starting at age 33 if you have a 30-pack-year history of smoking and currently smoke or have quit within the past 15 years. Fecal occult blood test (FOBT) of the stool. You may have this test every year starting at age 55. Flexible sigmoidoscopy or colonoscopy. You may have a sigmoidoscopy every 5 years or a colonoscopy every 10 years starting at age 21. Hepatitis C blood test. Hepatitis B blood test. Sexually transmitted disease (STD) testing. Diabetes screening. This is done by checking your blood sugar (glucose) after you have not eaten for a while (fasting). You may have this done every 1-3 years. Bone density scan. This is done to screen for osteoporosis. You may have this done starting at age 72. Mammogram. This may be done every 1-2 years. Talk to your health care provider about how often you should have regular mammograms. Talk with your health care provider about your test results, treatment options, and if necessary, the need for more tests. Vaccines  Your health care provider may recommend certain vaccines, such as: Influenza vaccine. This is recommended every year. Tetanus, diphtheria, and acellular pertussis (Tdap, Td) vaccine. You may need a Td booster every 10 years. Zoster vaccine. You may need this after age 70. Pneumococcal 13-valent conjugate (PCV13) vaccine. One dose is recommended after age 26. Pneumococcal polysaccharide (PPSV23) vaccine. One dose is recommended after age 10. Talk to your health care provider about which screenings and vaccines  you need and how often you need  them. This information is not intended to replace advice given to you by your health care provider. Make sure you discuss any questions you have with your health care provider. Document Released: 08/21/2015 Document Revised: 04/13/2016 Document Reviewed: 05/26/2015 Elsevier Interactive Patient Education  2017 [place] Prevention in the Home Falls can cause injuries. They can happen to people of all ages. There are many things you can do to make your home safe and to help prevent falls. What can I do on the outside of my home? Regularly fix the edges of walkways and driveways and fix any cracks. Remove anything that might make you trip as you walk through a door, such as a raised step or threshold. Trim any bushes or trees on the path to your home. Use bright outdoor lighting. Clear any walking paths of anything that might make someone trip, such as rocks or tools. Regularly check to see if handrails are loose or broken. Make sure that both sides of any steps have handrails. Any raised decks and porches should have guardrails on the edges. Have any leaves, snow, or ice cleared regularly. Use sand or salt on walking paths during winter. Clean up any spills in your garage right away. This includes oil or grease spills. What can I do in the bathroom? Use night lights. Install grab bars by the toilet and in the tub and shower. Do not use towel bars as grab bars. Use non-skid mats or decals in the tub or shower. If you need to sit down in the shower, use a plastic, non-slip stool. Keep the floor dry. Clean up any water that spills on the floor as soon as it happens. Remove soap buildup in the tub or shower regularly. Attach bath mats securely with double-sided non-slip rug tape. Do not have throw rugs and other things on the floor that can make you trip. What can I do in the bedroom? Use night lights. Make sure that you have a light by your bed that is easy to reach. Do not use  any sheets or blankets that are too big for your bed. They should not hang down onto the floor. Have a firm chair that has side arms. You can use this for support while you get dressed. Do not have throw rugs and other things on the floor that can make you trip. What can I do in the kitchen? Clean up any spills right away. Avoid walking on wet floors. Keep items that you use a lot in easy-to-reach places. If you need to reach something above you, use a strong step stool that has a grab bar. Keep electrical cords out of the way. Do not use floor polish or wax that makes floors slippery. If you must use wax, use non-skid floor wax. Do not have throw rugs and other things on the floor that can make you trip. What can I do with my stairs? Do not leave any items on the stairs. Make sure that there are handrails on both sides of the stairs and use them. Fix handrails that are broken or loose. Make sure that handrails are as long as the stairways. Check any carpeting to make sure that it is firmly attached to the stairs. Fix any carpet that is loose or worn. Avoid having throw rugs at the top or bottom of the stairs. If you do have throw rugs, attach them to the floor with carpet tape. Make sure  that you have a light switch at the top of the stairs and the bottom of the stairs. If you do not have them, ask someone to add them for you. What else can I do to help prevent falls? Wear shoes that: Do not have high heels. Have rubber bottoms. Are comfortable and fit you well. Are closed at the toe. Do not wear sandals. If you use a stepladder: Make sure that it is fully opened. Do not climb a closed stepladder. Make sure that both sides of the stepladder are locked into place. Ask someone to hold it for you, if possible. Clearly mark and make sure that you can see: Any grab bars or handrails. First and last steps. Where the edge of each step is. Use tools that help you move around (mobility aids)  if they are needed. These include: Canes. Walkers. Scooters. Crutches. Turn on the lights when you go into a dark area. Replace any light bulbs as soon as they burn out. Set up your furniture so you have a clear path. Avoid moving your furniture around. If any of your floors are uneven, fix them. If there are any pets around you, be aware of where they are. Review your medicines with your doctor. Some medicines can make you feel dizzy. This can increase your chance of falling. Ask your doctor what other things that you can do to help prevent falls. This information is not intended to replace advice given to you by your health care provider. Make sure you discuss any questions you have with your health care provider. Document Released: 05/21/2009 Document Revised: 12/31/2015 Document Reviewed: 08/29/2014 Elsevier Interactive Patient Education  2017 [employer].

## 2021-10-11 NOTE — Telephone Encounter (Signed)
? ?  Telephone encounter was:  Successful.  ?10/11/2021 ?Name: [name] MRN: [id number] DOB: 11-20-1955 ? ?[name] is a 66 y.o. year old female who is a primary care patient of Burns, Bobette Mo, MD . The community resource team was consulted for assistance with Patient emailed update has found new housing needed to be reconnected to [facility] and will reach out to patient to reestablish if there are additional needs at this time. ? ?Care guide performed the following interventions: Patient emailed update has found new housing needed to be reconnected to [facility] and will reach out to patient to reestablish if there are additional needs at this time.. ? ?Follow Up Plan:  Care guide will follow up with patient by phone over the next days ? ?[name] ?Care Guide , Embedded Care Coordination ?[facility], Care Management  ?[phone] ?[address] [place] , [place] [address] ?Email : [name] ?  ?

## 2021-10-11 NOTE — Patient Instructions (Addendum)
? ? ? ?Blood work was ordered.   ? ? ?Medications changes include :   none ? ? ? ? ?Return in about 1 year (around 10/13/2022) for CPE. ? ? ?Health Maintenance, Female ?Adopting a healthy lifestyle and getting preventive care are important in promoting health and wellness. Ask your health care provider about: ?The right schedule for you to have regular tests and exams. ?Things you can do on your own to prevent diseases and keep yourself healthy. ?What should I know about diet, weight, and exercise? ?Eat a healthy diet ? ?Eat a diet that includes plenty of vegetables, fruits, low-fat dairy products, and lean protein. ?Do not eat a lot of foods that are high in solid fats, added sugars, or sodium. ?Maintain a healthy weight ?Body mass index (BMI) is used to identify weight problems. It estimates body fat based on height and weight. Your health care provider can help determine your BMI and help you achieve or maintain a healthy weight. ?Get regular exercise ?Get regular exercise. This is one of the most important things you can do for your health. Most adults should: ?Exercise for at least 150 minutes each week. The exercise should increase your heart rate and make you sweat (moderate-intensity exercise). ?Do strengthening exercises at least twice a week. This is in addition to the moderate-intensity exercise. ?Spend less time sitting. Even light physical activity can be beneficial. ?Watch cholesterol and blood lipids ?Have your blood tested for lipids and cholesterol at 68 years of age, then have this test every 5 years. ?Have your cholesterol levels checked more often if: ?Your lipid or cholesterol levels are high. ?You are older than 68 years of age. ?You are at high risk for heart disease. ?What should I know about cancer screening? ?Depending on your health history and family history, you may need to have cancer screening at various ages. This may include screening for: ?Breast cancer. ?Cervical cancer. ?Colorectal  cancer. ?Skin cancer. ?Lung cancer. ?What should I know about heart disease, diabetes, and high blood pressure? ?Blood pressure and heart disease ?High blood pressure causes heart disease and increases the risk of stroke. This is more likely to develop in people who have high blood pressure readings or are overweight. ?Have your blood pressure checked: ?Every 3-5 years if you are 51-45 years of age. ?Every year if you are 39 years old or older. ?Diabetes ?Have regular diabetes screenings. This checks your fasting blood sugar level. Have the screening done: ?Once every three years after age 52 if you are at a normal weight and have a low risk for diabetes. ?More often and at a younger age if you are overweight or have a high risk for diabetes. ?What should I know about preventing infection? ?Hepatitis B ?If you have a higher risk for hepatitis B, you should be screened for this virus. Talk with your health care provider to find out if you are at risk for hepatitis B infection. ?Hepatitis C ?Testing is recommended for: ?Everyone born from [birth year] through 1967. ?Anyone with known risk factors for hepatitis C. ?Sexually transmitted infections (STIs) ?Get screened for STIs, including gonorrhea and chlamydia, if: ?You are sexually active and are younger than 68 years of age. ?You are older than 68 years of age and your health care provider tells you that you are at risk for this type of infection. ?Your sexual activity has changed since you were last screened, and you are at increased risk for chlamydia or gonorrhea. Ask your health care  provider if you are at risk. ?Ask your health care provider about whether you are at high risk for HIV. Your health care provider may recommend a prescription medicine to help prevent HIV infection. If you choose to take medicine to prevent HIV, you should first get tested for HIV. You should then be tested every 3 months for as long as you are taking the medicine. ?Pregnancy ?If you are  about to stop having your period (premenopausal) and you may become pregnant, seek counseling before you get pregnant. ?Take 400 to 800 micrograms (mcg) of folic acid every day if you become pregnant. ?Ask for birth control (contraception) if you want to prevent pregnancy. ?Osteoporosis and menopause ?Osteoporosis is a disease in which the bones lose minerals and strength with aging. This can result in bone fractures. If you are 40 years old or older, or if you are at risk for osteoporosis and fractures, ask your health care provider if you should: ?Be screened for bone loss. ?Take a calcium or vitamin D supplement to lower your risk of fractures. ?Be given hormone replacement therapy (HRT) to treat symptoms of menopause. ?Follow these instructions at home: ?Alcohol use ?Do not drink alcohol if: ?Your health care provider tells you not to drink. ?You are pregnant, may be pregnant, or are planning to become pregnant. ?If you drink alcohol: ?Limit how much you have to: ?0-1 drink a day. ?Know how much alcohol is in your drink. In the [place] one drink equals one 12 oz bottle of beer (355 mL), one 5 oz glass of wine (148 mL), or one 1? oz glass of hard liquor (44 mL). ?Lifestyle ?Do not use any products that contain nicotine or tobacco. These products include cigarettes, chewing tobacco, and vaping devices, such as e-cigarettes. If you need help quitting, ask your health care provider. ?Do not use street drugs. ?Do not share needles. ?Ask your health care provider for help if you need support or information about quitting drugs. ?General instructions ?Schedule regular health, dental, and eye exams. ?Stay current with your vaccines. ?Tell your health care provider if: ?You often feel depressed. ?You have ever been abused or do not feel safe at home. ?Summary ?Adopting a healthy lifestyle and getting preventive care are important in promoting health and wellness. ?Follow your health care provider's instructions about  healthy diet, exercising, and getting tested or screened for diseases. ?Follow your health care provider's instructions on monitoring your cholesterol and blood pressure. ?This information is not intended to replace advice given to you by your health care provider. Make sure you discuss any questions you have with your health care provider. ?Document Revised: 12/14/2020 Document Reviewed: 12/14/2020 ?Elsevier Patient Education ? [name]. ? ?

## 2021-10-11 NOTE — Progress Notes (Signed)
Subjective:   [name] is a 66 y.o. female who presents for Medicare Annual (Subsequent) preventive examination.   I connected with [name] today by telephone and verified that I am speaking with the correct person using two identifiers. Location patient: home Location provider: work Persons participating in the virtual visit: patient, provider.   I discussed the limitations, risks, security and privacy concerns of performing an evaluation and management service by telephone and the availability of in person appointments. I also discussed with the patient that there may be a patient responsible charge related to this service. The patient expressed understanding and verbally consented to this telephonic visit.    Interactive audio and video telecommunications were attempted between this provider and patient, however failed, due to patient having technical difficulties OR patient did not have access to video capability.  We continued and completed visit with audio only.    Review of Systems     Cardiac Risk Factors include: advanced age (>19mn, >>58women);dyslipidemia;hypertension     Objective:    Today's Vitals   10/11/21 1434  PainSc: 4    There is no height or weight on file to calculate BMI.  Advanced Directives 10/11/2021 07/25/2021 09/29/2020 04/17/2018 12/07/2017 10/09/2017 08/11/2017  Does Patient Have a Medical Advance Directive? No No Yes Yes Yes Yes No  Type of Advance Directive - - -Public librarianLiving will HRed CloudLiving will [place]Living will -  Does patient want to make changes to medical advance directive? - - No - Patient declined - - - -  Copy of [place] Chart? - - - No - copy requested - - -  Would patient like information on creating a medical advance directive? No - Patient declined No - Patient declined - - - - -    Current Medications (verified) Outpatient Encounter  Medications as of 10/11/2021  Medication Sig   bisacodyl (DULCOLAX) 5 MG EC tablet Take 30 mg by mouth daily as needed for moderate constipation.   enoxaparin (LOVENOX) 150 MG/ML injection Inject 1 mL (150 mg total) into the skin every 12 (twelve) hours. AS DIRECTED BY ANTICOAGULATION CLINIC   HYDROmorphone (DILAUDID) 4 MG tablet TAKE 1 TO 2 TABLETS BY MOUTH EVERY 6 HOURS AS NEEDED FOR BREAKTHROUGH PAIN   levothyroxine (SYNTHROID) 200 MCG tablet TAKE 1 TABLET BY MOUTH EVERY DAY BEFORE BREAKFAST   levothyroxine (SYNTHROID) 50 MCG tablet TAKE 1 TABLET (50 MCG TOTAL) BY MOUTH DAILY BEFORE BREAKFAST. TAKE IN ADDITION TO 200 MCG PILL FOR A TOTAL OF 250 MCG DAILY   methadone (DOLOPHINE) 10 MG tablet Take 10 mg by mouth every 8 (eight) hours as needed. for pain   RABEprazole (ACIPHEX) 20 MG tablet Take 20 mg by mouth daily as needed.   vitamin B-12 (CYANOCOBALAMIN) 1000 MCG tablet Take 1 tablet (1,000 mcg total) by mouth daily.   warfarin (COUMADIN) 5 MG tablet TAKE 1 AND 1/2 TABLETS DAILY EXCEPT TAKE 1 TABLET ON SUN AND THURSDAY OR TAKE AS DIRECTED BY ANTICOAGULATION CLINIC   No facility-administered encounter medications on file as of 10/11/2021.    Allergies (verified) Amitiza [lubiprostone], Morphine, Narcan [naloxone hcl], Nsaids, and Venlafaxine   History: Past Medical History:  Diagnosis Date   Anemia, unspecified    Anxiety    Arthritis    "about q joint i've got" (07/14/2016)   Benign neoplasm of colon 12/2007   Hyperplastic colon polyps   CAD (coronary artery disease)  minimal catheterization, October 2008   Cellulitis    Chest pain    with stress   Childhood asthma    Chronic low back pain    Constipation    and diarrhea chronic..Dr. 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   Methadone adverse reaction    for chronic leg and back pain   Other pulmonary embolism and infarction 2008   Significant 2008 and coumadin therapy RV dysfunction...echo...2008..EF 50%..right ventricle markedly dilated w marked right ventricular dysfunc and moder tricuspid regurg/echo..March 2010, Ef 50%, mild dilation of right ventricule w mild decrease right ventric function    Peripheral vascular disease (HCC)    Rectus sheath hematoma 07/13/2012   On coumadin    Right bundle branch block    intermittent   Thyroid disease    Tobacco use disorder    Urinary incontinence    Venous insufficiency (chronic) (peripheral)    Patient's legs were wrapped 2013   Past Surgical History:  Procedure Laterality Date   CARDIAC CATHETERIZATION  05/2007   Archie Endo 12/08/2010   CHOLECYSTECTOMY OPEN     COLONOSCOPY WITH PROPOFOL N/A 08/11/2017   Procedure: COLONOSCOPY WITH PROPOFOL;  Surgeon: Doran Stabler, MD;  Location: [facility];  Service: Gastroenterology;  Laterality: N/A;   GASTRIC BYPASS  1980s   JOINT REPLACEMENT     KNEE ARTHROSCOPY Left 05/2001   Archie Endo 12/21/2010   SHOULDER ARTHROSCOPY W/ ROTATOR CUFF REPAIR Right 08/2002   Archie Endo 12/21/2010   TONSILLECTOMY     TOTAL HIP ARTHROPLASTY Right 01/2008   Family History  Problem Relation Age of Onset   Heart disease Mother    Heart disease Father    Crohn's disease Sister    Social History   Socioeconomic History   Marital status: Divorced    Spouse name: Not on file   Number of children: 1   Years of education: Not on file   Highest education level: Not on file  Occupational History   Occupation: Disabled     Employer: UNEMPLOYED  Tobacco Use   Smoking status: Former    Packs/day: 0.10    Years: 27.00    Pack years: 2.70    Types: Cigarettes    Quit date: 06/09/2011    Years since quitting: 10.3   Smokeless tobacco: Never  Vaping Use   Vaping Use: Never used  Substance and Sexual Activity   Alcohol use: No    Alcohol/week: 0.0 standard drinks    Comment: 07/14/2016 "nothing since the early 1990s"   Drug use: No   Sexual activity: Not Currently  Other Topics Concern   Not on file  Social History Narrative   Current smoker wi last 12 mos.    Social Determinants of Health   Financial Resource Strain: Low Risk    Difficulty of Paying Living Expenses: Not hard at all  Food  Insecurity: No Food Insecurity   Worried About Charity fundraiser in the Last Year: Never true   Ran Out of Food in the Last Year: Never true  Transportation Needs: No Transportation Needs   Lack of Transportation (Medical): No   Lack of Transportation (Non-Medical): No  Physical Activity: Inactive   Days of Exercise per Week: 0 days   Minutes of Exercise per Session: 0 min  Stress: No Stress Concern Present   Feeling of Stress : Only a little  Social Connections: Socially Isolated   Frequency of Communication with Friends and Family: Twice a week   Frequency of Social Gatherings with Friends and Family: Twice a week   Attends Religious Services: Never   Printmaker: No   Attends Music therapist: Never   Marital Status: Divorced    Tobacco Counseling Counseling given: Not Answered   Clinical Intake:  Pre-visit preparation completed: Yes  Pain : 0-10 Pain Score: 4  Pain Type: Chronic pain Pain Location: Shoulder Pain Orientation: Left Pain Onset: More than a month ago Pain Frequency: Occasional Pain Relieving Factors: tylenol  Pain Relieving Factors: tylenol  Nutritional Risks: None Diabetes: No  How often do you need to have someone help you when  you read instructions, pamphlets, or other written materials from your doctor or pharmacy?: 1 - Never What is the last grade level you completed in school?: college  Diabetic?no   Interpreter Needed?: No  Information entered by :: [place] of Daily Living In your present state of health, do you have any difficulty performing the following activities: 10/11/2021  Hearing? N  Vision? N  Difficulty concentrating or making decisions? N  Walking or climbing stairs? N  Dressing or bathing? N  Doing errands, shopping? N  Preparing Food and eating ? N  Using the Toilet? N  In the past six months, have you accidently leaked urine? N  Do you have problems with loss of bowel control? N  Managing your Medications? N  Managing your Finances? N  Housekeeping or managing your Housekeeping? N  Some recent data might be hidden    Patient Care Team: Binnie Rail, MD as PCP - General (Internal Medicine) Wayland Salinas, MD as Referring Physician (Neurosurgery) Newt Minion, MD as Consulting Physician (Orthopedic Surgery) Myrlene Broker, MD as Attending Physician (Urology) Loletha Carrow Kirke Corin, MD as Consulting Physician (Gastroenterology) [name], LCSW as Social Worker (Licensed Clinical Social Worker)  Indicate any recent Medical Services you may have received from other than [facility] providers in the past year (date may be approximate).     Assessment:   This is a routine wellness examination for [name].  Hearing/Vision screen Vision Screening - Comments:: Annual eye exams wear glasses  Dietary issues and exercise activities discussed: Current Exercise Habits: The patient does not participate in regular exercise at present, Exercise limited by: orthopedic condition(s)   Goals Addressed             This Visit's Progress    Manage My Medicine   On track    Timeframe:  Long-Range Goal Priority:  Medium Start Date:   11/30/20                           Expected End Date:    06/06/22                 Follow Up  Date June 2023   - call for medicine refill 2 or 3 days before it runs out - call if I am sick and can't take my medicine - keep a list of all the medicines I take; vitamins and herbals too - use a pillbox to sort medicine  -Get Shingrix vaccine   Why is this important?   These steps will help you keep on track with your medicines.   Notes:        Depression Screen PHQ 2/9 Scores 10/11/2021 10/11/2021 09/29/2020 05/26/2020 04/17/2018 12/19/2012  PHQ - 2 Score 0 0 0 1 4 0  PHQ- 9 Score - - - 3 8 -    Fall Risk Fall Risk  10/11/2021 09/29/2020 11/25/2019 07/03/2019 04/17/2018  Falls in the past year? 0 0 1 0 Yes  Comment - - - Emmi Telephone Survey: data to providers prior to load -  Number falls in past yr: 0 0 1 - 2 or more  Injury with Fall? 0 0 1 - -  Risk Factor Category  - - - - High Fall Risk  Risk for fall due to : No Fall Risks;Impaired balance/gait No Fall Risks - - Impaired balance/gait;Impaired mobility  Risk for fall due to: Comment walker/cane - - - -  Follow up Education provided;Falls evaluation completed Falls evaluation completed Falls evaluation completed - Education provided;Falls prevention discussed    FALL RISK PREVENTION PERTAINING TO THE HOME:  Any stairs in or around the home? No  If so, are there any without handrails? No  Home free of loose throw rugs in walkways, pet beds, electrical cords, etc? Yes  Adequate lighting in your home to reduce risk of falls? Yes   ASSISTIVE DEVICES UTILIZED TO PREVENT FALLS:  Life alert? No  Use of a cane, walker or w/c? Yes  Grab bars in the bathroom? Yes  Shower chair or bench in shower? Yes  Elevated toilet seat or a handicapped toilet? Yes    Cognitive Function:    Normal cognitive status assessed by direct observation by this Nurse Health Advisor. No abnormalities found.      Immunizations Immunization History  Administered Date(s) Administered    Fluad Quad(high Dose 65+) 05/24/2019, 05/26/2020   Influenza Split 04/22/2011   Influenza, High Dose Seasonal PF 04/17/2018   Influenza,inj,Quad PF,6+ Mos 06/03/2013, 06/10/2014, 07/15/2016, 04/12/2017   PFIZER Comirnaty(Gray Top)Covid-19 Tri-Sucrose Vaccine 02/27/2021   PFIZER(Purple Top)SARS-COV-2 Vaccination 09/14/2019, 10/05/2019, 02/27/2021   Pneumococcal Conjugate-13 06/10/2014   Pneumococcal Polysaccharide-23 05/24/2019   Tdap 06/22/2012   Unspecified SARS-COV-2 Vaccination 09/14/2019, 10/05/2019, 07/28/2020    TDAP status: Up to date  Flu Vaccine status: Up to date  Pneumococcal vaccine status: Up to date  Covid-19 vaccine status: Completed vaccines  Qualifies for Shingles Vaccine? Yes   Zostavax completed Yes   Shingrix Completed?: No.    Education has been provided regarding the importance of this vaccine. Patient has been advised to call insurance company to determine out of pocket expense if they have not yet received this vaccine. Advised may also receive vaccine at local pharmacy or Health Dept. Verbalized acceptance and understanding.  Screening Tests Health Maintenance  Topic Date Due   Zoster Vaccines- Shingrix (1 of 2) Never done   MAMMOGRAM  08/11/2018   DEXA SCAN  Never done   Fecal DNA (Cologuard)  06/01/2020   INFLUENZA VACCINE  03/08/2021   TETANUS/TDAP  06/22/2022   Pneumonia Vaccine 76+ Years old  Completed   COVID-19 Vaccine  Completed  Hepatitis C Screening  Completed   HPV VACCINES  Aged Out    Health Maintenance  Health Maintenance Due  Topic Date Due   Zoster Vaccines- Shingrix (1 of 2) Never done   MAMMOGRAM  08/11/2018   DEXA SCAN  Never done   Fecal DNA (Cologuard)  06/01/2020   INFLUENZA VACCINE  03/08/2021    Colorectal cancer screening: Referral to GI placed 10/11/2021. Pt aware the office will call re: appt.  Mammogram status: Completed 09/07/2021. Repeat every year  Bone Density status: Ordered 10/11/2020. Pt provided with  contact info and advised to call to schedule appt.  Lung Cancer Screening: (Low Dose CT Chest recommended if Age 47-80 years, 30 pack-year currently smoking OR have quit w/in 15years.) does not qualify.   Lung Cancer Screening Referral: n/a  Additional Screening:  Hepatitis C Screening: does not qualify; Completed 06/10/2014  Vision Screening: Recommended annual ophthalmology exams for early detection of glaucoma and other disorders of the eye. Is the patient up to date with their annual eye exam?  Yes  Who is the provider or what is the name of the office in which the patient attends annual eye exams? Dr.Tanner  If pt is not established with a provider, would they like to be referred to a provider to establish care? No .   Dental Screening: Recommended annual dental exams for proper oral hygiene  Community Resource Referral / Chronic Care Management: CRR required this visit?  No   CCM required this visit?  No      Plan:     I have personally reviewed and noted the following in the patients chart:   Medical and social history Use of alcohol, tobacco or illicit drugs  Current medications and supplements including opioid prescriptions.  Functional ability and status Nutritional status Physical activity Advanced directives List of other physicians Hospitalizations, surgeries, and ER visits in previous 12 months Vitals Screenings to include cognitive, depression, and falls Referrals and appointments  In addition, I have reviewed and discussed with patient certain preventive protocols, quality metrics, and best practice recommendations. A written personalized care plan for preventive services as well as general preventive health recommendations were provided to patient.     Randel Pigg, LPN   08/13/1094   Nurse Notes: none

## 2021-10-12 ENCOUNTER — Ambulatory Visit (INDEPENDENT_AMBULATORY_CARE_PROVIDER_SITE_OTHER): Payer: Medicare HMO | Admitting: Licensed Clinical Social Worker

## 2021-10-12 ENCOUNTER — Ambulatory Visit (INDEPENDENT_AMBULATORY_CARE_PROVIDER_SITE_OTHER): Payer: Medicare HMO | Admitting: Internal Medicine

## 2021-10-12 ENCOUNTER — Encounter: Payer: Self-pay | Admitting: Internal Medicine

## 2021-10-12 VITALS — BP 114/68 | HR 71 | Temp 98.4°F | Ht 65.0 in

## 2021-10-12 DIAGNOSIS — J439 Emphysema, unspecified: Secondary | ICD-10-CM | POA: Diagnosis not present

## 2021-10-12 DIAGNOSIS — Z0000 Encounter for general adult medical examination without abnormal findings: Secondary | ICD-10-CM | POA: Diagnosis not present

## 2021-10-12 DIAGNOSIS — M25512 Pain in left shoulder: Secondary | ICD-10-CM | POA: Diagnosis not present

## 2021-10-12 DIAGNOSIS — I2510 Atherosclerotic heart disease of native coronary artery without angina pectoris: Secondary | ICD-10-CM

## 2021-10-12 DIAGNOSIS — I890 Lymphedema, not elsewhere classified: Secondary | ICD-10-CM

## 2021-10-12 DIAGNOSIS — E782 Mixed hyperlipidemia: Secondary | ICD-10-CM | POA: Diagnosis not present

## 2021-10-12 DIAGNOSIS — L89152 Pressure ulcer of sacral region, stage 2: Secondary | ICD-10-CM

## 2021-10-12 DIAGNOSIS — E039 Hypothyroidism, unspecified: Secondary | ICD-10-CM

## 2021-10-12 DIAGNOSIS — Z7901 Long term (current) use of anticoagulants: Secondary | ICD-10-CM

## 2021-10-12 DIAGNOSIS — G8929 Other chronic pain: Secondary | ICD-10-CM

## 2021-10-12 DIAGNOSIS — F439 Reaction to severe stress, unspecified: Secondary | ICD-10-CM

## 2021-10-12 LAB — CBC WITH DIFFERENTIAL/PLATELET
Basophils Absolute: 0 10*3/uL (ref 0.0–0.1)
Basophils Relative: 0.6 % (ref 0.0–3.0)
Eosinophils Absolute: 0.1 10*3/uL (ref 0.0–0.7)
Eosinophils Relative: 1.5 % (ref 0.0–5.0)
HCT: 38.9 % (ref 36.0–46.0)
Hemoglobin: 12.9 g/dL (ref 12.0–15.0)
Lymphocytes Relative: 25.1 % (ref 12.0–46.0)
Lymphs Abs: 1.1 10*3/uL (ref 0.7–4.0)
MCHC: 33.1 g/dL (ref 30.0–36.0)
MCV: 88.5 fl (ref 78.0–100.0)
Monocytes Absolute: 0.3 10*3/uL (ref 0.1–1.0)
Monocytes Relative: 8.1 % (ref 3.0–12.0)
Neutro Abs: 2.8 10*3/uL (ref 1.4–7.7)
Neutrophils Relative %: 64.7 % (ref 43.0–77.0)
Platelets: 153 10*3/uL (ref 150.0–400.0)
RBC: 4.39 Mil/uL (ref 3.87–5.11)
RDW: 14.8 % (ref 11.5–15.5)
WBC: 4.3 10*3/uL (ref 4.0–10.5)

## 2021-10-12 LAB — LIPID PANEL
Cholesterol: 119 mg/dL (ref 0–200)
HDL: 29.4 mg/dL — ABNORMAL LOW (ref >39.00)
LDL Cholesterol: 70 mg/dL (ref 0–99)
NonHDL: 89.93
Total CHOL/HDL Ratio: 4
Triglycerides: 102 mg/dL (ref 0.0–149.0)
VLDL: 20.4 mg/dL (ref 0.0–40.0)

## 2021-10-12 LAB — COMPREHENSIVE METABOLIC PANEL
ALT: 14 U/L (ref 0–35)
AST: 22 U/L (ref 0–37)
Albumin: 3.6 g/dL (ref 3.5–5.2)
Alkaline Phosphatase: 85 U/L (ref 39–117)
BUN: 7 mg/dL (ref 6–23)
CO2: 33 mEq/L — ABNORMAL HIGH (ref 19–32)
Calcium: 8.9 mg/dL (ref 8.4–10.5)
Chloride: 101 mEq/L (ref 96–112)
Creatinine, Ser: 0.69 mg/dL (ref 0.40–1.20)
GFR: 89.55 mL/min (ref >60.00)
Glucose, Bld: 88 mg/dL (ref 70–99)
Potassium: 3.5 mEq/L (ref 3.5–5.1)
Sodium: 138 mEq/L (ref 135–145)
Total Bilirubin: 0.5 mg/dL (ref 0.2–1.2)
Total Protein: 7.5 g/dL (ref 6.0–8.3)

## 2021-10-12 LAB — TSH: TSH: 3.21 u[IU]/mL (ref 0.35–5.50)

## 2021-10-12 NOTE — Chronic Care Management (AMB) (Signed)
?Chronic Care Management  ? Clinical Social Work Note ? ?10/12/2021 ?Name: [name] MRN: [id number] DOB: 13-Nov-1955 ? ?[name] is a 66 y.o. year old female who is a primary care patient of Burns, Claudina Lick, MD. The CCM team was consulted to assist the patient with chronic disease management and/or care coordination needs related to: Holiday representative and Mental Health Counseling and Resources.  ? ?Engaged with patient by telephone for follow up visit in response to provider referral for social work chronic care management and care coordination services.  ? ?Consent to Services:  ?The patient was given information about Chronic Care Management services, agreed to services, and gave verbal consent prior to initiation of services.  Please see initial visit note for detailed documentation.  ? ?Patient agreed to services and consent obtained.  ? ?Summary: Assessed patient's current treatment, progress, coping skills, support system and barriers to care.  She is making progress with managing stressors and has been able to located housing, .  See Care Plan below for interventions and patient self-care actives. ? ?Recommendation: Patient may benefit from, and is in agreement to work on new goal of advance care planning for [place]..  ? ?Follow up Plan: Patient would like continued follow-up from CCM LCSW.  per patient's request will follow up in 30 days.  Will call office if needed prior to next encounter. ?  ? ?Assessment: Review of patient past medical history, allergies, medications, and health status, including review of relevant consultants reports was performed today as part of a comprehensive evaluation and provision of chronic care management and care coordination services.    ? ?SDOH (Social Determinants of Health) assessments and interventions performed:   ? ?Advanced Directives Status: See Care Plan for related entries. ? ?CCM Care Plan ?Conditions to be addressed/monitored:  stress ;  ? ?Care  Plan : LCSW Plan of Care  ?Updates made by [name], LCSW since 10/12/2021 12:00 AM  ?  ? ?Problem: managing mental health   ?Priority: High  ?  ? ?Goal: Manage stress by connecting with therapist   ?Start Date: 06/10/2021  ?Expected End Date: 12/05/2021  ?This Visit's Progress: On track  ?Recent Progress: On track  ?Priority: High  ?Note:   ?Current Barriers:  ?Housing  ?Unmet mental health ? ?CSW Clinical Goal(s):  ?Patient  will work with housing agencies discussed today to address needs related to obtaining new housing  through collaboration with Holiday representative, provider, and care team.  ? ?Interventions: ?1:1 collaboration with primary care provider regarding development and update of comprehensive plan of care as evidenced by provider attestation and co-signature ?Inter-disciplinary care team collaboration (see longitudinal plan of care) ?Evaluation of current treatment plan related to  self management and patient's adherence to plan as established by provider ?Review resources, discussed options and provided patient information about  ?Housing resources (has Sec[employer] voucher; continues to live with friend) ?Discussed possible counseling - willing to connect ? ?Social Determinants of Health in Patient with CAD, HLD, and GERD:  (Status: Goal on Track (progressing): YES. Goal Met. Housing located housing ?SDOH assessments completed: Housing stress ?Evaluation of current treatment plan related to Stress at home / Adjustment reaction  ? ?Mental Health:  (Status: Goal on Track (progressing): YES. Goal Met.)  ?Evaluation of current treatment plan related to  Stress ?Solution-Focused Strategies employed:  ?Active listening / Reflection utilized  ?Emotional Support Provided ? ?Level of Care Concerns in a patient with CAD, HTN, and GERD:  (Status:  New goal.) ?Current level of care:  lives with Friend ?Evaluation of patient safety in current living environment ?Assessed understanding of Advance Directives. , A  voluntary discussion about advanced care planning including importance of advanced directives, healthcare proxy and living will was discussed., EMMI educational information on Advance Directives sent via e-mail , and Advance directive packet mailed   ?  ?Patient Self-Care Activities: ?Keep appoint with [place] ?Please look for an e-mail from [facility] for your Relieving Stress video ?Look for advance directive information in the mail ?  ? Carmen Lanius, LCSW ?Licensed Clinical Social Worker [facility]  ?[place]  ?[phone]  ? ?

## 2021-10-12 NOTE — Assessment & Plan Note (Signed)
Chronic ?B/l buttock pressure sore with erythema and a couple of scabbed areas - no open wounds / discharge - no infection ?Stressed trying to change positions frequently to prevent pressure on this area ?

## 2021-10-12 NOTE — Assessment & Plan Note (Signed)
Chronic ?Stressed diet changes - she is starting to do that -- discussed importance of weight loss ?Encouraged her to be as active as possible ?

## 2021-10-12 NOTE — Assessment & Plan Note (Addendum)
Chronic Stable 

## 2021-10-12 NOTE — Patient Instructions (Signed)
Visit Information ? ?Thank you for taking time to visit with me today. Please don't hesitate to contact me if I can be of assistance to you before our next scheduled telephone appointment. ? ?Following are the goals we discussed today: Advance Directives ?Patient Self-Care Activities: ?Keep appoint with [place] ?Please look for an e-mail from [facility] for your Relieving Stress video ?Look for advance directive information in the mail ? ?Our next appointment is by telephone on April 11th at 1:30 ? ?Please call the care guide team at [phone] if you need to cancel or reschedule your appointment.  ? ?If you are experiencing a Mental Health or [place] or need someone to talk to, please call 1-800-273-TALK (toll free, 24 hour hotline)  ? ?Patient verbalizes understanding of instructions and care plan provided today and agrees to view in [place]. Active MyChart status confirmed with patient.   ? ?Casimer Lanius, LCSW ?Licensed Clinical Social Worker [facility]  ?[place]  ?[phone]  ?

## 2021-10-12 NOTE — Assessment & Plan Note (Addendum)
Chronic ?Regular exercise and healthy diet encouraged ?Check lipid panel  ?Currently lifestyle controlled, statin intolerant - defers statin ? ?Lab Results  ?Component Value Date  ? [place] 69 04/17/2018  ? ? ?

## 2021-10-12 NOTE — Assessment & Plan Note (Signed)
chronic ?Started after flu shot in 2020 ?Has gotten worse recently ?Will see ortho ?

## 2021-10-12 NOTE — Assessment & Plan Note (Signed)
Chronic ?No change pain ?Defers statin ?Stressed weight loss, decrease portions, adjust timing of food ?

## 2021-10-12 NOTE — Assessment & Plan Note (Signed)
Chronic  ?Clinically euthyroid ?Currently taking levothyroxine 250 mcg daily ?Check tsh  ?Titrate med dose if needed ?

## 2021-10-12 NOTE — Assessment & Plan Note (Signed)
Chronic ?On warfarin lifelong for history of PE, DVT ?Management per our Coumadin clinic ?CBC, CMP ?

## 2021-10-19 DIAGNOSIS — R6889 Other general symptoms and signs: Secondary | ICD-10-CM | POA: Diagnosis not present

## 2021-10-21 ENCOUNTER — Ambulatory Visit (INDEPENDENT_AMBULATORY_CARE_PROVIDER_SITE_OTHER): Payer: Medicare HMO

## 2021-10-21 DIAGNOSIS — Z7901 Long term (current) use of anticoagulants: Secondary | ICD-10-CM

## 2021-10-21 DIAGNOSIS — Z86711 Personal history of pulmonary embolism: Secondary | ICD-10-CM | POA: Diagnosis not present

## 2021-10-21 LAB — POCT INR: INR: 2 (ref 2.0–3.0)

## 2021-10-21 NOTE — Progress Notes (Addendum)
Continue 1 tablet daily except take 1 1/2 tablets on Sundays.  Recheck in 2 week.  ?Unsure until pt returns call if her colposcopy is still scheduled for 3/23. ? ?3/20: Take last dose of coumadin ?3/21: NO coumadin, NO lovenox ?3/22: NO coumadin, Lovenox AM ONLY(before 7am) ?  ?3/23: SURGERY: NO COUMADIN, NO LOVENOX ?  ?3/24: Take 1 1/2 tablets coumadin(7.5 mg), Lovenox every 12 hours ?3/25: Take 1 1/2 tablets coumadin(7.5 mg), Lovenox every 12 hours ?3/26: No lovenox and restart normal dosing of coumadin. Continue 1 tablet daily except take 1 1/2 tablets on Sundays. ?  ?3/30: Recheck INR ?Went over dosing with pt on the phone and am also sending dosing schedule through [place] for pt reference. Pt read back instructions and voiced understanding. ?

## 2021-10-21 NOTE — Patient Instructions (Addendum)
Pre visit review using our clinic review tool, if applicable. No additional management support is needed unless otherwise documented below in the visit note. ? ?Continue 1 tablet daily except take 1 1/2 tablets on Sundays.  Recheck in 2 week.  ?3/20: Take last dose of coumadin ?3/21: NO coumadin, NO lovenox ?3/22: NO coumadin, Lovenox AM ONLY(before 7am) ?  ?3/23: SURGERY: NO COUMADIN, NO LOVENOX ?  ?3/24: Take 1 1/2 tablets coumadin(7.5 mg), Lovenox every 12 hours ?3/25: Take 1 1/2 tablets coumadin(7.5 mg), Lovenox every 12 hours ?3/26: No lovenox and restart normal dosing of coumadin. Continue 1 tablet daily except take 1 1/2 tablets on Sundays. ?  ?3/30: Recheck INR ?

## 2021-10-22 ENCOUNTER — Encounter: Payer: Self-pay | Admitting: Internal Medicine

## 2021-10-22 NOTE — Progress Notes (Signed)
Agree with management.  [name] J Carmen Maffei, MD  

## 2021-10-22 NOTE — Progress Notes (Signed)
Outside notes received. Information abstracted. Notes sent to scan.  

## 2021-10-28 DIAGNOSIS — R87610 Atypical squamous cells of undetermined significance on cytologic smear of cervix (ASC-US): Secondary | ICD-10-CM | POA: Diagnosis not present

## 2021-10-28 DIAGNOSIS — R6889 Other general symptoms and signs: Secondary | ICD-10-CM | POA: Diagnosis not present

## 2021-10-28 DIAGNOSIS — N870 Mild cervical dysplasia: Secondary | ICD-10-CM | POA: Diagnosis not present

## 2021-11-01 ENCOUNTER — Ambulatory Visit (INDEPENDENT_AMBULATORY_CARE_PROVIDER_SITE_OTHER): Payer: Medicare HMO | Admitting: Psychologist

## 2021-11-01 DIAGNOSIS — F331 Major depressive disorder, recurrent, moderate: Secondary | ICD-10-CM

## 2021-11-01 NOTE — Telephone Encounter (Signed)
Received VM that pt has been following dosing schedule and has not had any problems with the procedure. She will check iNR on 3/30. ?

## 2021-11-01 NOTE — Progress Notes (Signed)
[facility] Counselor/Therapist Progress Note ? ?Patient ID: [name], MRN: [id number],   ? ?Date: 11/01/2021 ? ?Time Spent: 1:05 pm to 1:44 pm; total time: 39 minutes ? ? This session was held via video webex teletherapy due to the coronavirus risk at this time. The patient consented to video teletherapy and was located at her home during this session. She is aware it is the responsibility of the patient to secure confidentiality on her end of the session. The provider was in a private home office for the duration of this session. Limits of confidentiality were discussed with the patient.  ? ?Treatment Type: Individual Therapy ? ?Reported Symptoms: Less Depression ? ?Mental Status Exam: ?Appearance:  Casual     ?Behavior: Appropriate  ?Motor: Normal  ?Speech/Language:  Clear and Coherent  ?Affect: Appropriate  ?Mood: normal  ?Thought process: normal  ?Thought content:   WNL  ?Sensory/Perceptual disturbances:   WNL  ?Orientation: oriented to person, place, and time/date  ?Attention: Good  ?Concentration: Good  ?Memory: WNL  ?Fund of knowledge:  Good  ?Insight:   Fair  ?Judgment:  Fair  ?Impulse Control: Good  ? ?Risk Assessment: ?Danger to Self:  No ?Self-injurious Behavior: No ?Danger to Others: No ?Duty to Warn:no ?Physical Aggression / Violence:No  ?Access to Firearms a concern: No  ?Gang Involvement:No  ? ?Subjective: Beginning the session, the patient was excited to disclose that her housing issue has been resolved. She reflected on this exciting news. She also reflected indicated that many of her concerns related to her health have been addressed. She asked for strategies to help with experiencing distressing thoughts. Patient attempted defusion, but found it difficult. She found guided imagery to be much more beneficial. She processed thoughts and emotions. She denied suicidal and homicidal ideation. She asked to follow up.   ? ?Interventions:  Worked on developing a therapeutic  relationship with the patient using active listening and reflective statements. Provided emotional support using empathy and validation. Used summary statements. Explored what has assisted the patient praised patient for the positive news that she has received related to housing. Reflected on other stressors and how they have been resolved. Used socratic questions to assist the patient gain insight into self. Provided psychoeducation about defusion and attempted defusion. Normalized patient's experience. Practiced guided imagery and praised patient for experiencing less distress. Encouraged patient to practice that exercise consistently. Discussed next steps for counseling. Assessed for suicidal and homicidal ideation.  ? ?Homework: NA ? ?Next Session: Emotional support ? ?Diagnosis: F33.1 major depressive affective disorder, recurrent, moderate ? ?Plan:  ?Client Abilities: Friendly and easy to develop rapport ? ?Client Preferences: ACT and CBT ? ?Client statement of Needs: Coping skills and emotional support ? ?Treatment Level: Outpatient ? ?Goals ?Alleviate depressive symptoms ?Recognize, accept, and cope with depressive feelings ?Develop healthy thinking patterns ?Develop healthy interpersonal relationships ? ?Objectives target date for all objectives is 09/15/2022 ?Cooperate with a medication evaluation by a physician ?Verbalize an accurate understanding of depression ?Verbalize an understanding of the treatment ?Identify and replace thoughts that support depression ?Learn and implement behavioral strategies ?Verbalize an understanding and resolution of current interpersonal problems ?Learn and implement problem solving and decision making skills ?Learn and implement conflict resolution skills to resolve interpersonal problems ?Verbalize an understanding of healthy and unhealthy emotions verbalize insight into how past relationships may be influence current experiences with depression ?Use mindfulness and  acceptance strategies and increase value based behavior  ?Increase hopeful statements about the future.  ?Interventions ?Consistent  with treatment model, discuss how change in cognitive, behavioral, and interpersonal can help client alleviate depression ?CBT ?Behavioral activation help the client explore the relationship, nature of the dispute,  ?Help the client develop new interpersonal skills and relationships ?Conduct Problem so living therapy ?Teach conflict resolution skills ?Use a process-experiential approach ?Conduct TLDP ?Conduct ACT ?Evaluate need for psychotropic medication ?Monitor adherence to medication  ? ?The patient and clinician reviewed the treatment plan on 10/04/2021. The patient approved of the treatment plan.  ? ? ? ?Conception Chancy, PsyD ? ? ? ? ? ? ? ? ? ? ? ? ? ? ? ? ? ?Conception Chancy, PsyD ?

## 2021-11-02 ENCOUNTER — Telehealth: Payer: Self-pay

## 2021-11-02 NOTE — Congregational Nurse Program (Signed)
Client has been approved for apt.  [address] but had been waiting, several calls to her caseworker were unanswered, got hold of the supervisor of unit and was able to get approval.  Only waiting for inspection then will be able to move in.  Sending her copy of [place] application for her to look at and I will contact her tomorrow to complete and fill out as much as can without pics.    Vinnie Langton, RN  [phone]  ? ?

## 2021-11-04 ENCOUNTER — Ambulatory Visit (INDEPENDENT_AMBULATORY_CARE_PROVIDER_SITE_OTHER): Payer: Medicare HMO

## 2021-11-04 DIAGNOSIS — Z7901 Long term (current) use of anticoagulants: Secondary | ICD-10-CM

## 2021-11-04 LAB — POCT INR: INR: 1.6 — AB (ref 2.0–3.0)

## 2021-11-04 NOTE — Patient Instructions (Addendum)
Pre visit review using our clinic review tool, if applicable. No additional management support is needed unless otherwise documented below in the visit note. ? ?Increase dose today to take 1 1/2 tablets and increase dose tomorrow to take 1 1/2 tablets and then continue 1 tablet daily except take 1 1/2 tablets on Sundays.  Recheck in 1 week.  ?

## 2021-11-04 NOTE — Progress Notes (Signed)
Pt had surgery on 3/23 and was placed on a lovenox bridge. This is first INR check since finishing lovenox bridge. ? ?[employer] home monitoring result is 1.6 for today's INR. ?Increase dose today to take 1 1/2 tablets and increase dose tomorrow to take 1 1/2 tablets and then continue 1 tablet daily except take 1 1/2 tablets on Sundays.  Recheck in 1 week.  ?Contacted pt by phone with instructions. Pt reported the surgery went fine and they did not find anything abnormal. Pt verbalized understanding. ?

## 2021-11-05 DIAGNOSIS — I2510 Atherosclerotic heart disease of native coronary artery without angina pectoris: Secondary | ICD-10-CM

## 2021-11-05 DIAGNOSIS — E782 Mixed hyperlipidemia: Secondary | ICD-10-CM

## 2021-11-09 ENCOUNTER — Telehealth: Payer: Self-pay | Admitting: *Deleted

## 2021-11-09 NOTE — Telephone Encounter (Signed)
? ?  Telephone encounter was:  Successful.  ?11/09/2021 ?Name: [name] MRN: [id number] DOB: 11/12/1955 ? ?[name] is a 67 y.o. year old female who is a primary care patient of Burns, Claudina Lick, MD . The community resource team was consulted for assistance with Food Insecurity and moving ? ?Care guide performed the following interventions: patient called in and is  waiting inspection of her new unit and is worried about the expense of having deposit plus rent and money for food so will enter an [facility] referral for these resources and provide what I am aware of  ? ?Follow Up Plan:  Client will reach out as needed  ? ?L[facility]ett Sox -Selinda Eon ?Care Guide , Embedded Care Coordination ?[facility], Care Management  ?[phone] ?[address] , [place] [address] ?Email : [email or web address] ?  ?

## 2021-11-10 ENCOUNTER — Encounter: Payer: Self-pay | Admitting: Internal Medicine

## 2021-11-10 DIAGNOSIS — L89301 Pressure ulcer of unspecified buttock, stage 1: Secondary | ICD-10-CM

## 2021-11-11 ENCOUNTER — Ambulatory Visit (INDEPENDENT_AMBULATORY_CARE_PROVIDER_SITE_OTHER): Payer: Medicare HMO

## 2021-11-11 DIAGNOSIS — Z7901 Long term (current) use of anticoagulants: Secondary | ICD-10-CM | POA: Diagnosis not present

## 2021-11-11 DIAGNOSIS — Z86711 Personal history of pulmonary embolism: Secondary | ICD-10-CM

## 2021-11-11 LAB — POCT INR: INR: 1.6 — AB (ref 2.0–3.0)

## 2021-11-11 NOTE — Patient Instructions (Addendum)
Pre visit review using our clinic review tool, if applicable. No additional management support is needed unless otherwise documented below in the visit note. ? ? ?Increase dose today to take 1 1/2 tablets and increase dose tomorrow to take 1 1/2 tablets and then change weekly dose to take 1 tablet daily except take 1 1/2 tablets on Sundays and Thursdays. Recheck in 2 week.  ?

## 2021-11-11 NOTE — Progress Notes (Signed)
Increase dose today to take 1 1/2 tablets and increase dose tomorrow to take 1 1/2 tablets and then change weekly dose to take 1 tablet daily except take 1 1/2 tablets on Sundays and Thursdays. Recheck in 2 week.  ?

## 2021-11-16 ENCOUNTER — Ambulatory Visit (INDEPENDENT_AMBULATORY_CARE_PROVIDER_SITE_OTHER): Payer: Medicare HMO | Admitting: Licensed Clinical Social Worker

## 2021-11-16 DIAGNOSIS — F439 Reaction to severe stress, unspecified: Secondary | ICD-10-CM

## 2021-11-16 DIAGNOSIS — I2510 Atherosclerotic heart disease of native coronary artery without angina pectoris: Secondary | ICD-10-CM

## 2021-11-16 DIAGNOSIS — E782 Mixed hyperlipidemia: Secondary | ICD-10-CM

## 2021-11-16 NOTE — Chronic Care Management (AMB) (Signed)
?Chronic Care Management  ? Clinical Social Work Note ? ?11/16/2021 ?Name: [name] MRN: [id number] DOB: 16-Apr-1956 ? ?[name] is a 66 y.o. year old female who is a primary care patient of Burns, Claudina Lick, MD. The CCM team was consulted to assist the patient with chronic disease management and/or care coordination needs related to: Holiday representative.  ? ?Engaged with patient by telephone for follow up visit in response to provider referral for social work chronic care management and care coordination services.  ? ?Consent to Services:  ?The patient was given information about Chronic Care Management services, agreed to services, and gave verbal consent prior to initiation of services.  Please see initial visit note for detailed documentation.  ?Patient agreed to services and consent obtained.  ? ?Summary: Assessed patient's current treatment, progress, coping skills, support system and barriers to care.  She has located housing, continues to wait for section 8 to inspect the property. She is making progress with and continues to meet with her therapist. .  See Care Plan below for interventions and patient self-care actives. ? ?Recommendation: Patient may benefit from, and is in agreement to continue to work to get advance directive complete.  ? ?Follow up Plan: Patient would like continued follow-up from CCM LCSW.  per patient's request will follow up in 60 days.  Will call office if needed prior to next encounter. ?  ?Assessment: Review of patient past medical history, allergies, medications, and health status, including review of relevant consultants reports was performed today as part of a comprehensive evaluation and provision of chronic care management and care coordination services.    ? ?SDOH (Social Determinants of Health) assessments and interventions performed:   ? ?Advanced Directives Status: See Care Plan for related entries. ? ?CCM Care Plan ?Conditions to be addressed/monitored:   Stress ;  ? ?Care Plan : LCSW Plan of Care  ?Updates made by [name], LCSW since 11/16/2021 12:00 AM  ?  ? ?Problem: managing mental health   ?Priority: High  ?  ? ?Goal: Manage stress by connecting with therapist   ?Start Date: 06/10/2021  ?This Visit's Progress: On track  ?Recent Progress: On track  ?Priority: High  ?Note:   ?Current Barriers:  ?Housing  ?Unmet mental health ? ?CSW Clinical Goal(s):  ?Patient  will work with housing agencies discussed today to address needs related to obtaining new housing  through collaboration with Holiday representative, provider, and care team.  ? ?Interventions: ?1:1 collaboration with primary care provider regarding development and update of comprehensive plan of care as evidenced by provider attestation and co-signature ?Inter-disciplinary care team collaboration (see longitudinal plan of care) ?Evaluation of current treatment plan related to  self management and patient's adherence to plan as established by provider ?Review resources, discussed options and provided patient information about  ?Housing resources (has [address] voucher; continues to live with friend) ?Discussed possible counseling - (Continues to meet with therapist at [facility]) ? ?Social Determinants of Health in Patient with CAD, HLD, and GERD:  (Status: Goal on Track (progressing): YES. Has located sec. 8 housing ?SDOH assessments completed: Housing stress ?Evaluation of current treatment plan related to Stress at home / Adjustment reaction  ? ?Mental Health:  (Status: Goal Met. No Needs Identified this visit) Continues to meet with therapist ?Evaluation of current treatment plan related to  Stress ?Solution-Focused Strategies employed:  ?Active listening / Reflection utilized  ?Emotional Support Provided ? ?Level of Care Concerns in a patient  with CAD, HTN, and GERD: Goal on Track (progressing): YES. Has spoken with family ?Current level of care:  lives with Friend ?Evaluation of  patient safety in current living environment ?Assessed understanding of Advance Directives. , A voluntary discussion about advanced care planning including importance of advanced directives, healthcare proxy and living will was discussed., EMMI educational information on Advance Directives sent via e-mail , and Advance directive packet mailed   ?  ?Patient Self-Care Activities: ?Keep appoints with [place] ?Complete Advance Directive  ?  ? ?Casimer Lanius, LCSW ?Licensed Clinical Social Worker [facility]  ?[place]  ?[phone]  ?

## 2021-11-16 NOTE — Patient Instructions (Signed)
Visit Information ? ?Thank you for taking time to visit with me today. Please don't hesitate to contact me if I can be of assistance to you before our next scheduled telephone appointment. ? ?Following are the goals we discussed today:  ?Patient Self-Care Activities: ?Keep appoints with [place] ?Complete Advance Directive  ?   ? Our next appointment is by telephone on June 12th at 1:15 ? ?Please call the care guide team at [phone] if you need to cancel or reschedule your appointment.  ? ?If you are experiencing a Mental Health or [place] or need someone to talk to, please call 1-800-273-TALK (toll free, 24 hour hotline)  ? ?Patient verbalizes understanding of instructions and care plan provided today and agrees to view in [place]. Active MyChart status confirmed with patient.   ? ?Casimer Lanius, LCSW ?Licensed Clinical Social Worker [facility]  ?[place]  ?[phone] ?

## 2021-11-21 ENCOUNTER — Other Ambulatory Visit: Payer: Self-pay | Admitting: Internal Medicine

## 2021-11-25 ENCOUNTER — Ambulatory Visit (INDEPENDENT_AMBULATORY_CARE_PROVIDER_SITE_OTHER): Payer: Medicare HMO

## 2021-11-25 DIAGNOSIS — Z7901 Long term (current) use of anticoagulants: Secondary | ICD-10-CM

## 2021-11-25 LAB — POCT INR: INR: 1.8 — AB (ref 2.0–3.0)

## 2021-11-25 NOTE — Progress Notes (Signed)
[employer] home monitoring result is 1.8 for today's INR. ?Increase dose today to take 2 tablets and increase dose tomorrow to take 1 1/2 tablets and then change weekly dose to take 1 1/2 tablet daily except take 1 tablets on Mondays, Wednesdays and Fridays.. Recheck in 1 week.  ?

## 2021-11-25 NOTE — Patient Instructions (Addendum)
Pre visit review using our clinic review tool, if applicable. No additional management support is needed unless otherwise documented below in the visit note. ? ?Increase dose today to take 2 tablets and increase dose tomorrow to take 1 1/2 tablets and then change weekly dose to take 1 1/2 tablet daily except take 1 tablets on Mondays, Wednesdays and Fridays.. Recheck in 1 week.  ?

## 2021-11-29 ENCOUNTER — Ambulatory Visit (INDEPENDENT_AMBULATORY_CARE_PROVIDER_SITE_OTHER): Payer: Medicare HMO | Admitting: Psychologist

## 2021-11-29 DIAGNOSIS — F331 Major depressive disorder, recurrent, moderate: Secondary | ICD-10-CM

## 2021-11-29 NOTE — Progress Notes (Signed)
[facility] Counselor/Therapist Progress Note ? ?Patient ID: [name], MRN: [id number],   ? ?Date: 11/29/2021 ? ?Time Spent: 1:15 pm to 1:35 pm; total time: 20 minutes ? ? This session was held via video webex teletherapy due to the coronavirus risk at this time. The patient consented to video teletherapy and was located at her home during this session. She is aware it is the responsibility of the patient to secure confidentiality on her end of the session. The provider was in a private home office for the duration of this session. Limits of confidentiality were discussed with the patient.  ? ?Treatment Type: Individual Therapy ? ?Reported Symptoms: Less Depression ? ?Mental Status Exam: ?Appearance:  Casual     ?Behavior: Appropriate  ?Motor: Normal  ?Speech/Language:  Clear and Coherent  ?Affect: Appropriate  ?Mood: normal  ?Thought process: normal  ?Thought content:   WNL  ?Sensory/Perceptual disturbances:   WNL  ?Orientation: oriented to person, place, and time/date  ?Attention: Good  ?Concentration: Good  ?Memory: WNL  ?Fund of knowledge:  Good  ?Insight:   Fair  ?Judgment:  Fair  ?Impulse Control: Good  ? ?Risk Assessment: ?Danger to Self:  No ?Self-injurious Behavior: No ?Danger to Others: No ?Duty to Warn:no ?Physical Aggression / Violence:No  ?Access to Firearms a concern: No  ?Gang Involvement:No  ? ?Subjective: Beginning the session, the patient described herself as doing well and denied any immediate concerns. She reflected on what has helped her make improvement so quickly. She voiced looking forward moving into her own place. She processed thoughts and emotions. She denied suicidal and homicidal ideation. She asked to follow up.   ? ?Interventions:  Worked on developing a therapeutic relationship with the patient using active listening and reflective statements. Provided emotional support using empathy and validation. Praised patient for doing well and explored what has assisted the  patient. Normalized and validated expressed thoughts and emotions. Reflected on what will continue moving the patient in a positive direction. Used socratic questions to assist the patient gain insight into self. Normalized and validated expressed thoughts. Provided empathic statements.  Assessed for suicidal and homicidal ideation.  ? ?Homework: NA ? ?Next Session: Emotional support. Possibly terminate  ? ?Diagnosis: F33.1 major depressive affective disorder, recurrent, moderate ? ?Plan:  ?Client Abilities: Friendly and easy to develop rapport ? ?Client Preferences: ACT and CBT ? ?Client statement of Needs: Coping skills and emotional support ? ?Treatment Level: Outpatient ? ?Goals ?Alleviate depressive symptoms ?Recognize, accept, and cope with depressive feelings ?Develop healthy thinking patterns ?Develop healthy interpersonal relationships ? ?Objectives target date for all objectives is 09/15/2022 ?Cooperate with a medication evaluation by a physician ?Verbalize an accurate understanding of depression ?Verbalize an understanding of the treatment ?Identify and replace thoughts that support depression ?Learn and implement behavioral strategies ?Verbalize an understanding and resolution of current interpersonal problems ?Learn and implement problem solving and decision making skills ?Learn and implement conflict resolution skills to resolve interpersonal problems ?Verbalize an understanding of healthy and unhealthy emotions verbalize insight into how past relationships may be influence current experiences with depression ?Use mindfulness and acceptance strategies and increase value based behavior  ?Increase hopeful statements about the future.  ?Interventions ?Consistent with treatment model, discuss how change in cognitive, behavioral, and interpersonal can help client alleviate depression ?CBT ?Behavioral activation help the client explore the relationship, nature of the dispute,  ?Help the client develop new  interpersonal skills and relationships ?Conduct Problem so living therapy ?Teach conflict resolution skills ?Use a  process-experiential approach ?Conduct TLDP ?Conduct ACT ?Evaluate need for psychotropic medication ?Monitor adherence to medication  ? ?The patient and clinician reviewed the treatment plan on 10/04/2021. The patient approved of the treatment plan.  ? ? ? ?Conception Chancy, PsyD ? ? ?

## 2021-11-30 ENCOUNTER — Telehealth: Payer: Self-pay

## 2021-11-30 MED ORDER — "INTERDRY 10""X36"" EX SHEE": MEDICATED_PATCH | CUTANEOUS | 5 refills | Status: DC

## 2021-11-30 MED ORDER — NYSTATIN 100000 UNIT/GM EX POWD: 1.0000 "application " | Freq: Three times a day (TID) | CUTANEOUS | 0 refills | Status: DC

## 2021-11-30 NOTE — Telephone Encounter (Signed)
[name] is calling to get verbal order for: ?Skilled nursing ?1 time week for 1 week ?2 times a week  for 1 week  ?1 time a week for 7 weeks ? ?OT, PT, Medical Social worker ? ?Pt has reoccurring rash, [name] wants Nystatin powder ordered to use and interdry. ? ?Pharmacy: ?[id number] [place] [place] - [address] AT [address]? ?Pt advise ?

## 2021-11-30 NOTE — Telephone Encounter (Signed)
Spoke with Diane and verbal given ?

## 2021-11-30 NOTE — Telephone Encounter (Signed)
Prescriptions sent. ? ?[place] for orders ?

## 2021-12-01 ENCOUNTER — Telehealth: Payer: Self-pay | Admitting: *Deleted

## 2021-12-01 NOTE — Telephone Encounter (Signed)
Pt was on cover-my-meds.. need PA for Nystatin powder. Completed PA w/ (Key: [id number]). Rec'd msg stating "Your information has been sent to [facility]".Marland KitchenJohny Chess  ?

## 2021-12-02 ENCOUNTER — Ambulatory Visit (INDEPENDENT_AMBULATORY_CARE_PROVIDER_SITE_OTHER): Payer: Medicare HMO

## 2021-12-02 DIAGNOSIS — Z7901 Long term (current) use of anticoagulants: Secondary | ICD-10-CM | POA: Diagnosis not present

## 2021-12-02 LAB — POCT INR: INR: 2.3 (ref 2.0–3.0)

## 2021-12-02 NOTE — Patient Instructions (Addendum)
Pre visit review using our clinic review tool, if applicable. No additional management support is needed unless otherwise documented below in the visit note. ? ?Continue 1 1/2 tablet daily except take 1 tablets on Mondays, Wednesdays and Fridays.. Recheck in 1 week.  ?

## 2021-12-02 NOTE — Telephone Encounter (Signed)
Rec;d fax from [facility] on [place]. Med was approved authorization good through 08/07/22. Faxed info to [facility].Marland KitchenJohny Chess ?

## 2021-12-02 NOTE — Progress Notes (Addendum)
[employer] home monitoring result is 2.3 for today's INR. ?Continue 1 1/2 tablet daily except take 1 tablets on Mondays, Wednesdays and Fridays.. Recheck in 1 week.  ?Contacted pt by phone and advised to continue current dosing and recheck in 1 week. Pt verbalized understanding.  ?

## 2021-12-05 DIAGNOSIS — I2510 Atherosclerotic heart disease of native coronary artery without angina pectoris: Secondary | ICD-10-CM

## 2021-12-05 DIAGNOSIS — E782 Mixed hyperlipidemia: Secondary | ICD-10-CM

## 2021-12-07 DIAGNOSIS — E782 Mixed hyperlipidemia: Secondary | ICD-10-CM | POA: Diagnosis not present

## 2021-12-07 DIAGNOSIS — Z86711 Personal history of pulmonary embolism: Secondary | ICD-10-CM

## 2021-12-07 DIAGNOSIS — K649 Unspecified hemorrhoids: Secondary | ICD-10-CM | POA: Diagnosis not present

## 2021-12-07 DIAGNOSIS — G8929 Other chronic pain: Secondary | ICD-10-CM | POA: Diagnosis not present

## 2021-12-07 DIAGNOSIS — Z9181 History of falling: Secondary | ICD-10-CM

## 2021-12-07 DIAGNOSIS — Z86718 Personal history of other venous thrombosis and embolism: Secondary | ICD-10-CM

## 2021-12-07 DIAGNOSIS — Z5181 Encounter for therapeutic drug level monitoring: Secondary | ICD-10-CM

## 2021-12-07 DIAGNOSIS — M25512 Pain in left shoulder: Secondary | ICD-10-CM | POA: Diagnosis not present

## 2021-12-07 DIAGNOSIS — Z6841 Body Mass Index (BMI) 40.0 and over, adult: Secondary | ICD-10-CM

## 2021-12-07 DIAGNOSIS — I2510 Atherosclerotic heart disease of native coronary artery without angina pectoris: Secondary | ICD-10-CM | POA: Diagnosis not present

## 2021-12-07 DIAGNOSIS — L89322 Pressure ulcer of left buttock, stage 2: Secondary | ICD-10-CM | POA: Diagnosis not present

## 2021-12-07 DIAGNOSIS — Z7901 Long term (current) use of anticoagulants: Secondary | ICD-10-CM

## 2021-12-07 DIAGNOSIS — E039 Hypothyroidism, unspecified: Secondary | ICD-10-CM | POA: Diagnosis not present

## 2021-12-07 DIAGNOSIS — I890 Lymphedema, not elsewhere classified: Secondary | ICD-10-CM | POA: Diagnosis not present

## 2021-12-07 DIAGNOSIS — Z79891 Long term (current) use of opiate analgesic: Secondary | ICD-10-CM

## 2021-12-09 ENCOUNTER — Ambulatory Visit (INDEPENDENT_AMBULATORY_CARE_PROVIDER_SITE_OTHER): Payer: Medicare HMO

## 2021-12-09 DIAGNOSIS — Z86711 Personal history of pulmonary embolism: Secondary | ICD-10-CM | POA: Diagnosis not present

## 2021-12-09 DIAGNOSIS — Z7901 Long term (current) use of anticoagulants: Secondary | ICD-10-CM

## 2021-12-09 LAB — POCT INR: INR: 2.4 (ref 2.0–3.0)

## 2021-12-09 NOTE — Progress Notes (Signed)
[employer] home monitoring result is 2.4 for today's INR. ?Continue 1 1/2 tablet daily except take 1 tablets on Mondays, Wednesdays and Fridays.. Recheck in 2 week.  ?Contacted pt by phone and advised to continue current dosing and recheck in 2 week. Pt verbalized understanding.  ?

## 2021-12-09 NOTE — Patient Instructions (Addendum)
Pre visit review using our clinic review tool, if applicable. No additional management support is needed unless otherwise documented below in the visit note. ? ?Continue 1 1/2 tablet daily except take 1 tablets on Mondays, Wednesdays and Fridays.. Recheck in 2 week.  ?

## 2021-12-10 ENCOUNTER — Other Ambulatory Visit: Payer: Self-pay

## 2021-12-10 ENCOUNTER — Telehealth: Payer: Self-pay | Admitting: Internal Medicine

## 2021-12-10 MED ORDER — NYSTATIN 100000 UNIT/GM EX POWD: 1.0000 "application " | Freq: Three times a day (TID) | CUTANEOUS | 0 refills | Status: DC

## 2021-12-10 NOTE — Telephone Encounter (Signed)
Faxed in today. 

## 2021-12-10 NOTE — Telephone Encounter (Signed)
1.Medication Requested: ?nystatin (MYCOSTATIN/NYSTOP) powder ?2. Pharmacy (Name, Street, [place]): ?[id number] [place] [address]  [phone] ?Fax:  [phone] ?  ? ?3. On Med List: yes ? ?4. Last Visit with PCP: ? ?5. Next visit date with PCP: ? ? ?Agent: Please be advised that RX refills may take up to 3 business days. We ask that you follow-up with your pharmacy.  ?

## 2021-12-12 ENCOUNTER — Encounter: Payer: Self-pay | Admitting: Internal Medicine

## 2021-12-13 ENCOUNTER — Telehealth: Payer: Self-pay | Admitting: Internal Medicine

## 2021-12-13 NOTE — Telephone Encounter (Signed)
[name] is calling to requesting OT EVAL - When orignal OT went out patient was moving to another apartment.  She has since moved and needs help with ocupational needs and shower needs ?

## 2021-12-13 NOTE — Telephone Encounter (Signed)
Verbals given today. °

## 2021-12-13 NOTE — Telephone Encounter (Signed)
Okay for orders? 

## 2021-12-15 ENCOUNTER — Encounter: Payer: Self-pay | Admitting: Internal Medicine

## 2021-12-15 DIAGNOSIS — M170 Bilateral primary osteoarthritis of knee: Secondary | ICD-10-CM

## 2021-12-15 DIAGNOSIS — M545 Low back pain, unspecified: Secondary | ICD-10-CM

## 2021-12-23 ENCOUNTER — Ambulatory Visit (INDEPENDENT_AMBULATORY_CARE_PROVIDER_SITE_OTHER): Payer: Medicare HMO

## 2021-12-23 DIAGNOSIS — Z7901 Long term (current) use of anticoagulants: Secondary | ICD-10-CM

## 2021-12-23 LAB — POCT INR: INR: 4 — AB (ref 2.0–3.0)

## 2021-12-23 NOTE — Patient Instructions (Addendum)
Pre visit review using our clinic review tool, if applicable. No additional management support is needed unless otherwise documented below in the visit note.  Hold dose today and then change weekly dose to take except take 1 1/2 tablets on Tuesdays and Saturdays Recheck in 1 week.

## 2021-12-23 NOTE — Progress Notes (Signed)
[employer] home monitoring result is 4.0 for today's INR. Hold dose today and then change weekly dose to take 1 tablet daily except take 1 tablet daily except take 1 1/2 tablets on Tuesdays and Saturdays Recheck in 2 week.  Contacted pt by phone and advised to continue current dosing and recheck in 1 week. Pt verbalized understanding.

## 2021-12-29 ENCOUNTER — Ambulatory Visit (INDEPENDENT_AMBULATORY_CARE_PROVIDER_SITE_OTHER): Payer: Medicare HMO

## 2021-12-29 DIAGNOSIS — Z7901 Long term (current) use of anticoagulants: Secondary | ICD-10-CM | POA: Diagnosis not present

## 2021-12-29 LAB — POCT INR: INR: 2.6 (ref 2.0–3.0)

## 2021-12-29 NOTE — Progress Notes (Signed)
[employer] home monitoring result is 2.6 for today's INR. Continue 1 tablet daily except take 1 1/2 tablets on Tuesdays and Saturdays Recheck in 2 week.  Sent pt mychart message with dosing instructions and recheck date.

## 2021-12-29 NOTE — Patient Instructions (Addendum)
Pre visit review using our clinic review tool, if applicable. No additional management support is needed unless otherwise documented below in the visit note.  Continue 1 tablet daily except take 1 1/2 tablets on Tuesdays and Saturdays Recheck in 2 week.

## 2022-01-04 ENCOUNTER — Ambulatory Visit (INDEPENDENT_AMBULATORY_CARE_PROVIDER_SITE_OTHER): Payer: Medicare HMO | Admitting: Psychologist

## 2022-01-04 DIAGNOSIS — F331 Major depressive disorder, recurrent, moderate: Secondary | ICD-10-CM | POA: Diagnosis not present

## 2022-01-04 NOTE — Progress Notes (Signed)
[facility] [facility] Counselor/Therapist Progress Note  Patient ID: [name], MRN: [id number],    Date: 01/04/2022  Time Spent: 1:05 pm to 1:22 pm; total time: 17 minutes   This session was held via video webex teletherapy due to the coronavirus risk at this time. The patient consented to video teletherapy and was located at her home during this session. She is aware it is the responsibility of the patient to secure confidentiality on her end of the session. The provider was in a private home office for the duration of this session. Limits of confidentiality were discussed with the patient.   Treatment Type: Individual Therapy  Reported Symptoms: Less Depression  Mental Status Exam: Appearance:  Casual     Behavior: Appropriate  Motor: Normal  Speech/Language:  Clear and Coherent  Affect: Appropriate  Mood: normal  Thought process: normal  Thought content:   WNL  Sensory/Perceptual disturbances:   WNL  Orientation: oriented to person, place, and time/date  Attention: Good  Concentration: Good  Memory: WNL  Fund of knowledge:  Good  Insight:   Fair  Judgment:  Fair  Impulse Control: Good   Risk Assessment: Danger to Self:  No Self-injurious Behavior: No Danger to Others: No Duty to Warn:no Physical Aggression / Violence:No  Access to Firearms a concern: No  Gang Involvement:No   Subjective: Beginning the session, the patient described herself as doing well. Continuing to talk, she disclosed that she has moved into her new home. She reflected on what is going well and how she does not believe she needs counseling currently. She identified barriers and how to overcome them. She denied suicidal and homicidal ideation. She terminated services.    Interventions:  Worked on developing a therapeutic relationship with the patient using active listening and reflective statements. Provided emotional support using empathy and validation. Reflected on events since the last  session. Reviewed the patient moving into her own housing independently. Explored how living independently has assisted the patient. Used socratic questions to assist the patient. Processed patient's growth and what she learned about self. Identified barriers and addressed how to overcome them. Reflected on warning signs that patient needs counseling. Praised patient for her growth. Assisted in problem solving. Provided empathic statements.  Assessed for suicidal and homicidal ideation.   Homework: NA  Next Session: NA. Patient terminated services  Diagnosis: F33.1 major depressive affective disorder, recurrent, moderate  Plan:  Client Abilities: Friendly and easy to develop rapport  Client Preferences: ACT and CBT  Client statement of Needs: Coping skills and emotional support  Treatment Level: Outpatient  Goals Alleviate depressive symptoms Recognize, accept, and cope with depressive feelings Develop healthy thinking patterns Develop healthy interpersonal relationships  Objectives target date for all objectives is 09/15/2022 Cooperate with a medication evaluation by a physician Verbalize an accurate understanding of depression Verbalize an understanding of the treatment Identify and replace thoughts that support depression Learn and implement behavioral strategies Verbalize an understanding and resolution of current interpersonal problems Learn and implement problem solving and decision making skills Learn and implement conflict resolution skills to resolve interpersonal problems Verbalize an understanding of healthy and unhealthy emotions verbalize insight into how past relationships may be influence current experiences with depression Use mindfulness and acceptance strategies and increase value based behavior  Increase hopeful statements about the future.  Interventions Consistent with treatment model, discuss how change in cognitive, behavioral, and interpersonal can help  client alleviate depression CBT Behavioral activation help the client explore the relationship, nature of  the dispute,  Help the client develop new interpersonal skills and relationships Conduct Problem so living therapy Teach conflict resolution skills Use a process-experiential approach Conduct TLDP Conduct ACT Evaluate need for psychotropic medication Monitor adherence to medication   The patient and clinician reviewed the treatment plan on 10/04/2021. The patient approved of the treatment plan.     Conception Chancy, PsyD

## 2022-01-06 ENCOUNTER — Encounter: Payer: Self-pay | Admitting: Internal Medicine

## 2022-01-12 ENCOUNTER — Ambulatory Visit (INDEPENDENT_AMBULATORY_CARE_PROVIDER_SITE_OTHER): Payer: Medicare HMO

## 2022-01-12 DIAGNOSIS — Z7901 Long term (current) use of anticoagulants: Secondary | ICD-10-CM

## 2022-01-12 LAB — POCT INR: INR: 2.6 (ref 2.0–3.0)

## 2022-01-12 NOTE — Progress Notes (Signed)
Continue 1 tablet daily except take 1 1/2 tablets on Tuesdays and Saturdays Recheck in 2 week.

## 2022-01-12 NOTE — Telephone Encounter (Signed)
See telephone encounter.

## 2022-01-12 NOTE — Patient Instructions (Addendum)
Pre visit review using our clinic review tool, if applicable. No additional management support is needed unless otherwise documented below in the visit note.  Continue 1 tablet daily except take 1 1/2 tablets on Tuesdays and Saturdays Recheck in 2 week.

## 2022-01-17 ENCOUNTER — Encounter: Payer: Self-pay | Admitting: Licensed Clinical Social Worker

## 2022-01-17 ENCOUNTER — Ambulatory Visit (INDEPENDENT_AMBULATORY_CARE_PROVIDER_SITE_OTHER): Payer: Medicare HMO | Admitting: Licensed Clinical Social Worker

## 2022-01-17 DIAGNOSIS — I2510 Atherosclerotic heart disease of native coronary artery without angina pectoris: Secondary | ICD-10-CM

## 2022-01-17 DIAGNOSIS — Z7189 Other specified counseling: Secondary | ICD-10-CM

## 2022-01-17 DIAGNOSIS — E782 Mixed hyperlipidemia: Secondary | ICD-10-CM

## 2022-01-17 NOTE — Chronic Care Management (AMB) (Signed)
Chronic Care Management   Clinical Social Work Note  01/17/2022 Name: [name] MRN: [id number] DOB: 10-31-1955  [name] is a 66 y.o. year old female who is a primary care patient of Burns, Carmen Lick, MD. The CCM team was consulted to assist the patient with chronic disease management and/or care coordination needs related to: Holiday representative.   Engaged with patient by telephone for follow up visit in response to provider referral for social work chronic care management and care coordination services.   Consent to Services:  The patient was given information about Chronic Care Management services, agreed to services, and gave verbal consent prior to initiation of services.  Please see initial visit note for detailed documentation.   Patient agreed to services and consent obtained.   Summary:  Patient is making progress and has completed most of her goals.  Completed PT, has a new walker; Completed therapy goals with Dr. Michail [name], she will call him as needed; has moved into new apartment and making friends. She is pleases with her quality of life and enjoying her new apartment .  See Care Plan below for interventions and patient self-care actives.  Recommendation: Patient may benefit from, and is in agreement to continue to increase social interaction with new friends at apartment complex . Will also follow up with [employer] to address transportation concerns.   Follow up Plan:  Patient would like continued follow-up from CCM LCSW.  per patient's request will follow up in 60.  Will call office if needed prior to next encounter. Will route chart to Care Guide to reschedule phone appointment     Assessment: Review of patient past medical history, allergies, medications, and health status, including review of relevant consultants reports was performed today as part of a comprehensive evaluation and provision of chronic care management and care coordination services.     SDOH  (Social Determinants of Health) assessments and interventions performed:  SDOH Interventions    Flowsheet Row Most Recent Value  SDOH Interventions   Social Connections Interventions Other (Comment)        Advanced Directives Status: See Care Plan for related entries.  CCM Care Plan Conditions to be addressed/monitored: ;  managing chronic conditions  Care Plan : LCSW Plan of Care  Updates made by Carmen Cane, LCSW since 01/17/2022 12:00 AM     Problem: managing mental health   Priority: High     Goal: Manage Stress   Start Date: 06/10/2021  Recent Progress: On track  Priority: High  Note:   Current Barriers:  No Advanced Directives in place  CSW Clinical Goal(s):  Patient  will work with housing agencies discussed today to address needs related to obtaining new housing  through collaboration with Holiday representative, provider, and care team.   Interventions: 1:1 collaboration with primary care provider regarding development and update of comprehensive plan of care as evidenced by provider attestation and co-signature Inter-disciplinary care team collaboration (see longitudinal plan of care) Evaluation of current treatment plan related to  self management and patient's adherence to plan as established by provider Review resources, discussed options and provided patient information about  Housing resources (has [employer] voucher; continues to live with friend) Discussed possible counseling - (Continues to meet with therapist at [facility])  Social Determinants of Health in Patient with CAD, HLD, and GERD:  (Status: Goal Met. Patient declined further engagement on this goal. Has moved into new apartment. SDOH assessments completed: Housing stress Evaluation of  current treatment plan related to Stress at home / Adjustment reaction   Mental Health:  (Status: Goal Met. Patient declined further engagement on this goal.) therapy completed Evaluation of  current treatment plan related to  Stress Solution-Focused Strategies employed:  Active listening / Reflection utilized  Emotional Support Provided  Level of Care Concerns in a patient with CAD, HTN, and GERD: Goal on Track (progressing): YES.  Current level of care: home, alone and   Evaluation of patient safety in current living environment Assessed understanding of Advance Directives. , A voluntary discussion about advanced care planning including importance of advanced directives, healthcare proxy and living will was discussed., and Advance directive packet mailed     Patient Self-Care Activities: Complete Advance Directive  Call [employer] to address your concerns with Transportation      Carmen Lanius, LCSW Licensed Clinical Social Worker [name]  [place]  [phone]

## 2022-01-17 NOTE — Patient Instructions (Signed)
Visit Information  Thank you for taking time to visit with me today. Please don't hesitate to contact me if I can be of assistance to you before our next scheduled telephone appointment.  Following are the goals we discussed today:   Patient Self-Care Activities: Complete Advance Directive  Call [employer] to address your concerns with Transportation       Casimer Lanius, LCSW Licensed Clinical Social Worker [facility]  [place]  [phone]   Please call the care guide team at [phone] if you need to cancel or reschedule your appointment.   If you are experiencing a Mental Health or [place] or need someone to talk to, please call the Suicide and Crisis Lifeline: 988 call 1-800-273-TALK (toll free, 24 hour hotline)   The patient verbalized understanding of instructions, educational materials, and care plan provided today and agreed to receive a mailed copy of patient instructions, educational materials, and care plan.   No follow up scheduled, per our conversation I will have my scheduler contact you in 60 days to schedule a phone appointment.   Please call the office if needed prior to next encounter.  Casimer Lanius, LCSW Licensed Clinical Social Worker [facility]  [place]  [phone]

## 2022-01-18 ENCOUNTER — Other Ambulatory Visit: Payer: Self-pay | Admitting: Internal Medicine

## 2022-01-18 ENCOUNTER — Telehealth: Payer: Self-pay | Admitting: Internal Medicine

## 2022-01-18 ENCOUNTER — Telehealth: Payer: Medicare HMO

## 2022-01-18 DIAGNOSIS — Z7901 Long term (current) use of anticoagulants: Secondary | ICD-10-CM

## 2022-01-18 NOTE — Telephone Encounter (Signed)
Called to report burning during urination x2 days.   Called neurologist- referred to primary.   Frequency in urination, foul odor. Signs of UTI.

## 2022-01-19 ENCOUNTER — Encounter: Payer: Self-pay | Admitting: Internal Medicine

## 2022-01-19 NOTE — Telephone Encounter (Signed)
Angel with [facility] calls back regarding this request. Stated she was close to PT and would have the time to get a sample if need be!  CB: [phone]

## 2022-01-19 NOTE — Telephone Encounter (Signed)
Ok for sample?

## 2022-01-19 NOTE — Telephone Encounter (Signed)
Message left for [facility] today

## 2022-01-20 ENCOUNTER — Encounter: Payer: Self-pay | Admitting: Internal Medicine

## 2022-01-20 ENCOUNTER — Other Ambulatory Visit: Payer: Self-pay

## 2022-01-20 ENCOUNTER — Telehealth: Payer: Self-pay | Admitting: Internal Medicine

## 2022-01-20 NOTE — Progress Notes (Signed)
Subjective:    Patient ID: [name], female    DOB: 1956-02-09, 66 y.o.   MRN: [id number]      HPI [name] is here for  Chief Complaint  Patient presents with   Urinary Tract Infection    ? UTI:  Her symptoms started a couple of weeks ago.  She states urinary frequency/urgency, urine odor, dark urine, urethra spasm, and her bladder burned at times.    She was trying to treat it at home and did not want to come in.  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  Current Outpatient Medications on File Prior to Visit  Medication Sig Dispense Refill   bisacodyl (DULCOLAX) 5 MG EC tablet Take 30 mg by mouth daily as needed for moderate constipation.     HYDROmorphone (DILAUDID) 4 MG tablet TAKE 1 TO 2 TABLETS BY MOUTH EVERY 6 HOURS AS NEEDED FOR BREAKTHROUGH PAIN  0   levothyroxine (SYNTHROID) 200 MCG tablet TAKE 1 TABLET BY MOUTH EVERY DAY BEFORE BREAKFAST 90 tablet 0   levothyroxine (SYNTHROID) 50 MCG tablet TAKE 1 TABLET (50 MCG TOTAL) BY MOUTH DAILY BEFORE BREAKFAST. TAKE IN ADDITION TO 200 MCG PILL FOR A TOTAL OF 250 MCG DAILY 90 tablet 3   methadone (DOLOPHINE) 10 MG tablet Take 10 mg by mouth every 8 (eight) hours as needed. for pain  0   naloxone (NARCAN) nasal spray 4 mg/0.1 mL SMARTSIG:Both Nares     nystatin (MYCOSTATIN/NYSTOP) powder Apply 1 application. topically 3 (three) times daily. 15 g 0   nystatin-triamcinolone ointment (MYCOLOG) Apply topically 2 (two) times daily.     RABEprazole (ACIPHEX) 20 MG tablet Take 20 mg by mouth daily as needed.     Skin Protectants, Misc. (INTERDRY 10"X36") SHEE UAD 30 each 5   vitamin B-12 (CYANOCOBALAMIN) 1000 MCG tablet Take 1 tablet (1,000 mcg total) by mouth daily.     warfarin (COUMADIN) 5 MG tablet TAKE 1 AND 1/2 TABLETS DAILY EXCEPT TAKE 1 TABLET ON SUN AND THURSDAY OR TAKE AS DIRECTED BY ANTICOAGULATION CLINIC 120 tablet 1   No current  facility-administered medications on file prior to visit.    Review of Systems  Constitutional:  Negative for chills and fever.  Gastrointestinal:  Positive for abdominal pain. Negative for nausea.  Genitourinary:  Positive for frequency, pelvic pain (bladder pain) and urgency. Negative for difficulty urinating, dysuria and hematuria.       Bladder burned,  Urine has been dark yellow and had abnormal odor       Objective:   Vitals:   01/21/22 1534  BP: 118/60  Pulse: 71  Temp: 98.5 F (36.9 C)  SpO2: 94%   BP Readings from Last 3 Encounters:  01/21/22 118/60  10/12/21 114/68  07/25/21 132/60   Wt Readings from Last 3 Encounters:  01/21/22 (!) 310 lb (140.6 kg)  07/25/21 (!) 315 lb (142.9 kg)  06/08/20 (!) 315 lb (142.9 kg)   Body mass index is 51.59 kg/m.    Physical Exam Constitutional:      General: She is not in acute distress.    Appearance: Normal appearance. She is not ill-appearing.  HENT:     Head: Normocephalic.  Eyes:     Conjunctiva/sclera: Conjunctivae normal.  Abdominal:     General: There is no distension.     Palpations: Abdomen is soft.     Tenderness: There is no  abdominal tenderness (LLQ tenderness). There is no right CVA tenderness, left CVA tenderness, guarding or rebound.  Skin:    General: Skin is warm and dry.  Neurological:     Mental Status: She is alert.            Assessment & Plan:    See Problem List for Assessment and Plan of chronic medical problems.

## 2022-01-20 NOTE — Patient Instructions (Addendum)
       Medications changes include :   take the nitrofurantoin only if your symptoms do not improve.  Take AZO or pyridium for the pain.    Continue increased fluids.  Eat a low acid diet.    Your prescription(s) have been sent to your pharmacy.     Return if symptoms worsen or fail to improve.

## 2022-01-20 NOTE — Telephone Encounter (Signed)
Patient has drank 6 glasses of water today and is unable to urinate for her urine sample - patient is having a lot of pain and pressure when trying to urinate.  Patient will come in for a visit tomorrow for possible UTI

## 2022-01-21 ENCOUNTER — Ambulatory Visit (INDEPENDENT_AMBULATORY_CARE_PROVIDER_SITE_OTHER): Payer: Medicare HMO | Admitting: Internal Medicine

## 2022-01-21 VITALS — BP 118/60 | HR 71 | Temp 98.5°F | Ht 65.0 in | Wt 310.0 lb

## 2022-01-21 DIAGNOSIS — N3010 Interstitial cystitis (chronic) without hematuria: Secondary | ICD-10-CM | POA: Diagnosis not present

## 2022-01-21 DIAGNOSIS — R300 Dysuria: Secondary | ICD-10-CM | POA: Diagnosis not present

## 2022-01-21 DIAGNOSIS — N3000 Acute cystitis without hematuria: Secondary | ICD-10-CM | POA: Insufficient documentation

## 2022-01-21 LAB — POC URINALSYSI DIPSTICK (AUTOMATED)
Bilirubin, UA: NEGATIVE
Glucose, UA: NEGATIVE
Ketones, UA: NEGATIVE
Nitrite, UA: NEGATIVE
Protein, UA: NEGATIVE
Spec Grav, UA: 1.015 (ref 1.010–1.025)
Urobilinogen, UA: 0.2 E.U./dL
pH, UA: 7 (ref 5.0–8.0)

## 2022-01-21 MED ORDER — NITROFURANTOIN MONOHYD MACRO 100 MG PO CAPS: 100.0000 mg | ORAL_CAPSULE | Freq: Two times a day (BID) | ORAL | 0 refills | Status: DC

## 2022-01-21 NOTE — Telephone Encounter (Signed)
Spoke with patient. She has been able to urinate fine. 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 She will try to keep appointment today.

## 2022-01-21 NOTE — Assessment & Plan Note (Addendum)
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

## 2022-01-21 NOTE — Assessment & Plan Note (Addendum)
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  No history of kidney stones Urine looks positive for infection, but in reviewing previous urines she has had positive urinalysis and negative cultures Advised IC diet Continue increased fluids Try OTC Pyridium We sent nitrofurantoin to her pharmacy since this is less likely to interact with her warfarin, but she will only use this if her symptoms get worse in the next couple of days Hopefully she can wait until the culture comes back so we know for sure

## 2022-01-21 NOTE — Telephone Encounter (Signed)
Yes, if she cannot urinate she may have a UTI and or urinary retention.  She may need a Foley catheter.  She probably should go to the emergency room instead of coming here.

## 2022-01-23 LAB — CULTURE, URINE COMPREHENSIVE

## 2022-01-24 MED ORDER — AMOXICILLIN-POT CLAVULANATE 875-125 MG PO TABS: 1.0000 | ORAL_TABLET | Freq: Two times a day (BID) | ORAL | 0 refills | Status: AC

## 2022-01-24 NOTE — Addendum Note (Signed)
Addended by: Binnie Rail on: 01/24/2022 07:19 AM   Modules accepted: Orders

## 2022-01-25 ENCOUNTER — Ambulatory Visit: Payer: Medicare HMO | Admitting: Gastroenterology

## 2022-01-25 ENCOUNTER — Telehealth: Payer: Self-pay

## 2022-01-25 NOTE — Telephone Encounter (Signed)
Pt reports she was prescribed Augmentin for UTI but has not started it yet. She is concerned about interaction with warfarin.   Advised pt to start Augmentin today and check INR on 6/22. Advised if any signs or symptoms of bleeding contact the office or go to the ER. Pt verbalized understanding.

## 2022-01-27 ENCOUNTER — Ambulatory Visit (INDEPENDENT_AMBULATORY_CARE_PROVIDER_SITE_OTHER): Payer: Medicare HMO

## 2022-01-27 ENCOUNTER — Ambulatory Visit: Payer: Self-pay | Admitting: Licensed Clinical Social Worker

## 2022-01-27 DIAGNOSIS — Z86711 Personal history of pulmonary embolism: Secondary | ICD-10-CM

## 2022-01-27 DIAGNOSIS — F439 Reaction to severe stress, unspecified: Secondary | ICD-10-CM

## 2022-01-27 DIAGNOSIS — Z7901 Long term (current) use of anticoagulants: Secondary | ICD-10-CM | POA: Diagnosis not present

## 2022-01-27 DIAGNOSIS — E039 Hypothyroidism, unspecified: Secondary | ICD-10-CM

## 2022-01-27 DIAGNOSIS — I2510 Atherosclerotic heart disease of native coronary artery without angina pectoris: Secondary | ICD-10-CM

## 2022-01-27 LAB — POCT INR: INR: 3 (ref 2.0–3.0)

## 2022-01-27 NOTE — Patient Instructions (Addendum)
Pre visit review using our clinic review tool, if applicable. No additional management support is needed unless otherwise documented below in the visit note.  Continue 1 tablet daily except take 1 1/2 tablets on Tuesdays and Saturdays. Recheck in 2 weeks.

## 2022-01-27 NOTE — Patient Instructions (Signed)
  Congratulations on achieving your goals! It was a pleasure working with you, and I hope you continue to make great strides in improving your health. Continue to enjoy your new home  No Follow up Scheduled:  You do not require continued follow-up by CCM LCSW I will disconnect from your care team at this time Please contact the office if needed  Casimer Lanius, LCSW Licensed Clinical Social Worker [facility]  [place]  [phone]

## 2022-01-30 ENCOUNTER — Encounter: Payer: Self-pay | Admitting: Internal Medicine

## 2022-02-04 DIAGNOSIS — E782 Mixed hyperlipidemia: Secondary | ICD-10-CM

## 2022-02-04 DIAGNOSIS — I2510 Atherosclerotic heart disease of native coronary artery without angina pectoris: Secondary | ICD-10-CM

## 2022-02-04 DIAGNOSIS — E039 Hypothyroidism, unspecified: Secondary | ICD-10-CM

## 2022-02-10 ENCOUNTER — Ambulatory Visit (INDEPENDENT_AMBULATORY_CARE_PROVIDER_SITE_OTHER): Payer: Medicare HMO

## 2022-02-10 DIAGNOSIS — Z7901 Long term (current) use of anticoagulants: Secondary | ICD-10-CM | POA: Diagnosis not present

## 2022-02-10 DIAGNOSIS — Z86711 Personal history of pulmonary embolism: Secondary | ICD-10-CM

## 2022-02-10 LAB — POCT INR: INR: 2.8 (ref 2.0–3.0)

## 2022-02-10 NOTE — Patient Instructions (Addendum)
Pre visit review using our clinic review tool, if applicable. No additional management support is needed unless otherwise documented below in the visit note.  Continue 1 tablet daily except take 1 1/2 tablets on Tuesdays and Saturdays. Recheck in 2 weeks. Advised pt of dosing and recheck in 2 weeks.

## 2022-02-10 NOTE — Progress Notes (Signed)
[facility] home monitoring result is 2.8 for today's INR. Continue 1 tablet daily except take 1 1/2 tablets on Tuesdays and Saturdays. Recheck in 2 weeks. Advised pt of dosing and recheck in 2 weeks. Pt verbalized understanding.

## 2022-02-15 DIAGNOSIS — R6889 Other general symptoms and signs: Secondary | ICD-10-CM | POA: Diagnosis not present

## 2022-02-16 ENCOUNTER — Other Ambulatory Visit: Payer: Self-pay | Admitting: Internal Medicine

## 2022-02-18 ENCOUNTER — Encounter: Payer: Self-pay | Admitting: Internal Medicine

## 2022-02-19 DIAGNOSIS — I2510 Atherosclerotic heart disease of native coronary artery without angina pectoris: Secondary | ICD-10-CM | POA: Diagnosis not present

## 2022-02-19 DIAGNOSIS — G8929 Other chronic pain: Secondary | ICD-10-CM | POA: Diagnosis not present

## 2022-02-19 DIAGNOSIS — M25512 Pain in left shoulder: Secondary | ICD-10-CM | POA: Diagnosis not present

## 2022-02-19 DIAGNOSIS — Z9181 History of falling: Secondary | ICD-10-CM

## 2022-02-19 DIAGNOSIS — Z79891 Long term (current) use of opiate analgesic: Secondary | ICD-10-CM

## 2022-02-19 DIAGNOSIS — E039 Hypothyroidism, unspecified: Secondary | ICD-10-CM | POA: Diagnosis not present

## 2022-02-19 DIAGNOSIS — Z7901 Long term (current) use of anticoagulants: Secondary | ICD-10-CM

## 2022-02-19 DIAGNOSIS — Z5181 Encounter for therapeutic drug level monitoring: Secondary | ICD-10-CM

## 2022-02-19 DIAGNOSIS — I890 Lymphedema, not elsewhere classified: Secondary | ICD-10-CM | POA: Diagnosis not present

## 2022-02-19 DIAGNOSIS — Z86718 Personal history of other venous thrombosis and embolism: Secondary | ICD-10-CM

## 2022-02-19 DIAGNOSIS — Z6841 Body Mass Index (BMI) 40.0 and over, adult: Secondary | ICD-10-CM | POA: Diagnosis not present

## 2022-02-19 DIAGNOSIS — Z86711 Personal history of pulmonary embolism: Secondary | ICD-10-CM

## 2022-02-19 DIAGNOSIS — K649 Unspecified hemorrhoids: Secondary | ICD-10-CM | POA: Diagnosis not present

## 2022-02-19 DIAGNOSIS — E782 Mixed hyperlipidemia: Secondary | ICD-10-CM | POA: Diagnosis not present

## 2022-02-23 ENCOUNTER — Ambulatory Visit (INDEPENDENT_AMBULATORY_CARE_PROVIDER_SITE_OTHER): Payer: Medicare HMO

## 2022-02-23 DIAGNOSIS — Z7901 Long term (current) use of anticoagulants: Secondary | ICD-10-CM

## 2022-02-23 LAB — POCT INR: INR: 3.5 — AB (ref 2.0–3.0)

## 2022-02-23 NOTE — Progress Notes (Signed)
[facility] home monitoring result is 3.5 for today's INR. Hold dose today and then change weekly dose to take 1 tablet daily except take 1 1/2 tablets on Saturdays. Recheck in 2 weeks. Advised pt of dosing and recheck in 2 weeks. Pt verbalized understanding.

## 2022-02-23 NOTE — Patient Instructions (Addendum)
Pre visit review using our clinic review tool, if applicable. No additional management support is needed unless otherwise documented below in the visit note.  Hold dose today and then change weekly dose to take 1 tablet daily except take 1 1/2 tablets on Saturdays. Recheck in 2 weeks.

## 2022-03-03 MED ORDER — NYSTATIN-TRIAMCINOLONE 100000-0.1 UNIT/GM-% EX OINT
TOPICAL_OINTMENT | Freq: Two times a day (BID) | CUTANEOUS | 2 refills | Status: DC
Start: 2022-03-03 — End: 2022-12-02

## 2022-03-09 ENCOUNTER — Telehealth: Payer: Self-pay

## 2022-03-09 NOTE — Telephone Encounter (Signed)
Pt [name] she missed her dose of warfarin last night and is scheduled to check her INR today and would like direction on what to do. [name] advising pt to take 1 1/2 tablets today and then test INR tomorrow.

## 2022-03-10 ENCOUNTER — Ambulatory Visit (INDEPENDENT_AMBULATORY_CARE_PROVIDER_SITE_OTHER): Payer: Medicare HMO

## 2022-03-10 DIAGNOSIS — Z7901 Long term (current) use of anticoagulants: Secondary | ICD-10-CM | POA: Diagnosis not present

## 2022-03-10 DIAGNOSIS — Z86711 Personal history of pulmonary embolism: Secondary | ICD-10-CM

## 2022-03-10 LAB — POCT INR: INR: 2.5 (ref 2.0–3.0)

## 2022-03-10 NOTE — Progress Notes (Signed)
[facility] home monitoring result is 2.5 for today's INR. Continue1 tablet daily except take 1 1/2 tablets on Saturdays. Recheck in 2 weeks. Advised pt of dosing and recheck in 2 weeks. Pt verbalized understanding.

## 2022-03-10 NOTE — Progress Notes (Signed)
[facility] progress note:  History: [name] 03/11/2022  Referring provider: Binnie Rail, MD  Reason for consult/chief complaint: Colonoscopy (On coumadin, discuss Linzess, ha sissues with constipation)   Subjective  HPI: From my January 2021 office note: "Last seen Jan 2019 for colonoscopy due to positive Cologuard. Left-sided diverticulosis and 3 diminutive adenomas found. Recall was placed for 3 years, however with new guidelines we will change it to 5 years. Has OIC.   Had routine appointment with OB/GYN a few months back, reportedly detected a hernia and advised her to see me. That office note by Dr. Lucillie Garfinkel 06/07/19 reviewed.   [name] has generalized abdominal bloating, chronic constipation for which she takes 6 laxative tablets a day to have some intermittent loose stool.  She reports trial of Amitiza in the past and feel lightheaded and dizzy, and Linzess cause stool to be too loose and gave her incontinence. When she bends over she has a bandlike pain and firmness in the upper abdomen.  She has been very concerned since her OB/GYN visit suggesting that she had a hernia.  She has a history of some kind of gastric weight loss surgery decades ago, and a subsequent cholecystectomy that she says was done through the same upper midline incision." _______________________________ She received a sample trial of Movantik 25 mg once daily visit.  However, she did not take it after reading in close materials and discovering that it may cause opiate withdrawal.  She discussed with pain control physician who suggested she have a further discussion.  In the interim, the samples expired and she disposed of them.  [name] still struggles with constipation, bloating and abdominal pain.  She has an upper abdominal fullness and hardness at times like before.  5 Dulcolax tablets a day will lead to a bowel movement every other day with pain and straining for him.  She  denies rectal bleeding.   ROS:  Review of Systems  Constitutional:  Negative for appetite change and unexpected weight change.  HENT:  Negative for mouth sores and voice change.   Eyes:  Negative for pain and redness.  Respiratory:  Positive for shortness of breath. Negative for cough.   Cardiovascular:  Negative for chest pain and palpitations.  Genitourinary:  Negative for dysuria and hematuria.  Musculoskeletal:  Positive for arthralgias and back pain. Negative for myalgias.  Skin:  Negative for pallor and rash.  Neurological:  Negative for weakness and headaches.  Hematological:  Negative for adenopathy.     Past Medical History: Past Medical History:  Diagnosis Date   Anemia, unspecified    Anxiety    Arthritis    "about q joint i've got" (07/14/2016)   Benign neoplasm of colon 12/2007   Hyperplastic colon polyps   CAD (coronary artery disease)    minimal catheterization, October 2008   Cellulitis    Chest pain    with stress   Childhood asthma    Chronic low back pain    Constipation    and diarrhea chronic..Dr. 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   Methadone adverse reaction    for chronic leg and back pain   Other pulmonary embolism and infarction 2008   Significant 2008 and coumadin therapy RV dysfunction...echo...2008..EF 50%..right ventricle markedly dilated w marked right ventricular dysfunc and moder tricuspid regurg/echo..March  2010, Ef 50%, mild dilation of right ventricule w mild decrease right ventric function    Peripheral vascular disease (HCC)    Rectus sheath hematoma 07/13/2012   On coumadin    Right bundle branch block    intermittent   Thyroid disease    Tobacco use disorder    Urinary incontinence    Venous insufficiency (chronic) (peripheral)    Patient's legs were wrapped 2013     Past Surgical History: Past Surgical History:  Procedure Laterality Date   CARDIAC CATHETERIZATION  05/2007   Archie Endo 12/08/2010   CHOLECYSTECTOMY OPEN     COLONOSCOPY WITH PROPOFOL N/A 08/11/2017   Procedure: COLONOSCOPY WITH PROPOFOL;  Surgeon: Doran Stabler, MD;  Location: [facility];  Service: Gastroenterology;  Laterality: N/A;   GASTRIC BYPASS  1980s   JOINT REPLACEMENT     KNEE ARTHROSCOPY Left 05/2001   Archie Endo 12/21/2010   SHOULDER ARTHROSCOPY W/ ROTATOR CUFF REPAIR Right 08/2002   Archie Endo 12/21/2010   TONSILLECTOMY     TOTAL HIP ARTHROPLASTY Right 01/2008     Family History: Family History  Problem Relation Age of Onset   Heart disease Mother    Heart disease Father    Crohn's disease Sister    Osteoporosis Sister        brittle bones   Lung cancer Maternal Grandfather    Other Paternal Grandfather        broken heart   Other Daughter        drug overdose    Social History: Social History   Socioeconomic History   Marital status: Divorced    Spouse name: Not on file   Number of children: 1   Years of education: Not on file   Highest education level: Not on file  Occupational History   Occupation: Disabled    Employer: UNEMPLOYED  Tobacco Use   Smoking status: Former    Packs/day: 0.10    Years: 27.00    Total pack years: 2.70    Types: Cigarettes    Quit date: 06/09/2011    Years since quitting: 10.7   Smokeless tobacco: Never  Vaping Use   Vaping Use: Never used  Substance and Sexual Activity   Alcohol use: No    Alcohol/week: 0.0 standard drinks of alcohol    Comment:  07/14/2016 "nothing since the early 1990s"   Drug use: No   Sexual activity: Not Currently  Other Topics Concern   Not on file  Social History Narrative   Current smoker wi last 12 mos.    Social Determinants of Health   Financial Resource Strain: Low Risk  (10/11/2021)   Overall Financial Resource Strain (CARDIA)    Difficulty of Paying Living Expenses: Not hard at all  Food Insecurity: No Food Insecurity (10/11/2021)   Hunger Vital Sign    Worried About Running Out of Food in the Last Year: Never true    Ran Out of Food in the Last Year: Never true  Transportation Needs: No Transportation Needs (10/11/2021)  PRAPARE - Hydrologist (Medical): No    Lack of Transportation (Non-Medical): No  Physical Activity: Inactive (10/11/2021)   Exercise Vital Sign    Days of Exercise per Week: 0 days    Minutes of Exercise per Session: 0 min  Stress: No Stress Concern Present (01/17/2022)   [place]    Feeling of Stress : Not at all  Social Connections: Moderately Isolated (01/17/2022)   Social Connection and Isolation Panel [NHANES]    Frequency of Communication with Friends and Family: More than three times a week    Frequency of Social Gatherings with Friends and Family: More than three times a week    Attends Religious Services: 1 to 4 times per year    Active Member of [employer] or Organizations: No    Attends Archivist Meetings: Never    Marital Status: Divorced    Allergies: Allergies  Allergen Reactions   Amitiza [Lubiprostone] Diarrhea and Nausea And Vomiting   Morphine Anaphylaxis    REACTION: nausea, rash, itching, vomiting   Narcan [Naloxone Hcl] Other (See Comments)    Pt was not her self   Nsaids     Bowel irregularity (coffee grounds)   Venlafaxine Other (See Comments)    itching    Outpatient Meds: Current Outpatient Medications  Medication Sig Dispense Refill    bisacodyl (DULCOLAX) 5 MG EC tablet Take 30 mg by mouth daily as needed for moderate constipation.     HYDROmorphone (DILAUDID) 4 MG tablet TAKE 1 TO 2 TABLETS BY MOUTH EVERY 6 HOURS AS NEEDED FOR BREAKTHROUGH PAIN  0   levothyroxine (SYNTHROID) 200 MCG tablet TAKE 1 TABLET BY MOUTH EVERY DAY BEFORE BREAKFAST 90 tablet 0   levothyroxine (SYNTHROID) 50 MCG tablet TAKE 1 TABLET (50 MCG TOTAL) BY MOUTH DAILY BEFORE BREAKFAST. TAKE IN ADDITION TO 200 MCG PILL FOR A TOTAL OF 250 MCG DAILY 90 tablet 3   methadone (DOLOPHINE) 10 MG tablet Take 10 mg by mouth every 8 (eight) hours as needed. for pain  0   naloxone (NARCAN) nasal spray 4 mg/0.1 mL SMARTSIG:Both Nares     nystatin (MYCOSTATIN/NYSTOP) powder Apply 1 application. topically 3 (three) times daily. 15 g 0   nystatin-triamcinolone ointment (MYCOLOG) Apply topically 2 (two) times daily. 30 g 2   RABEprazole (ACIPHEX) 20 MG tablet Take 20 mg by mouth daily as needed.     Skin Protectants, Misc. (INTERDRY 10"X36") SHEE UAD 30 each 5   vitamin B-12 (CYANOCOBALAMIN) 1000 MCG tablet Take 1 tablet (1,000 mcg total) by mouth daily.     warfarin (COUMADIN) 5 MG tablet TAKE 1 AND 1/2 TABLETS DAILY EXCEPT TAKE 1 TABLET ON SUN AND THURSDAY OR TAKE AS DIRECTED BY ANTICOAGULATION CLINIC 120 tablet 1   No current facility-administered medications for this visit.      ___________________________________________________________________ Objective   Exam:  BP 120/60 (BP Location: Left Arm, Patient Position: Sitting, Cuff Size: Large)   Pulse (!) 59   Ht '5\' 4"'$  (1.626 m)   Wt (!) 311 lb (141.1 kg)   BMI 53.38 kg/m  Wt Readings from Last 3 Encounters:  03/11/22 (!) 311 lb (141.1 kg)  01/21/22 (!) 310 lb (140.6 kg)  07/25/21 (!) 315 lb (142.9 kg)    General: Antalgic gait with a walker, gets on low exam table slowly but without assistance Eyes: sclera anicteric, no redness ENT: oral mucosa moist without lesions, no cervical  or supraclavicular  lymphadenopathy CV: Regular without murmur,no peripheral edema Resp: clear to auscultation bilaterally, normal RR and effort noted GI: soft, mild upper tenderness over rectus diastasis, with active bowel sounds.  Unable to assess hepatosplenomegaly or mass due to abdominal girth. Skin; warm and dry, no rash or jaundice noted Neuro: awake, alert and oriented x 3. Normal gross motor function and fluent speech  Labs:     Latest Ref Rng & Units 10/12/2021   11:35 AM 07/25/2021   10:16 AM 05/26/2020    1:58 PM  CBC  WBC 4.0 - 10.5 K/uL 4.3  6.6  5.5   Hemoglobin 12.0 - 15.0 g/dL 12.9  13.2  12.5   Hematocrit 36.0 - 46.0 % 38.9  41.1  38.0   Platelets 150.0 - 400.0 K/uL 153.0  142  161.0       Latest Ref Rng & Units 10/12/2021   11:35 AM 07/25/2021   10:16 AM 05/26/2020    1:58 PM  CMP  Glucose 70 - 99 mg/dL 88  93  90   BUN 6 - 23 mg/dL '7  5  7   '$ Creatinine 0.40 - 1.20 mg/dL 0.69  0.79  0.70   Sodium 135 - 145 mEq/L 138  136  139   Potassium 3.5 - 5.1 mEq/L 3.5  3.1  3.6   Chloride 96 - 112 mEq/L 101  99  101   CO2 19 - 32 mEq/L 33  32  34   Calcium 8.4 - 10.5 mg/dL 8.9  8.6  8.9   Total Protein 6.0 - 8.3 g/dL 7.5  7.3  7.6   Total Bilirubin 0.2 - 1.2 mg/dL 0.5  1.2  0.6   Alkaline Phos 39 - 117 U/L 85  77  81   AST 0 - 37 U/L '22  22  26   '$ ALT 0 - 35 U/L '14  15  18     '$ Assessment: Encounter Diagnoses  Name Primary?   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 Recommended she had MiraLAX a capful once daily and increase to twice daily if needed along with her Dulcolax tablets. We currently do not have any Movantik samples.  She received some, she is interested in trying medicine.  There is reported risk of opiate withdrawal on the monograph of this  medicine, but the actual risk of that is uncertain given how it works on the mu receptors in the gut.  A short trial of the medicine seems reasonable.  She is due for surveillance colonoscopy and felt ready to schedule that today.  Procedure described along with risks and benefits.  The benefits and risks of the planned procedure were described in detail with the patient or (when appropriate) their health care proxy.  Risks were outlined as including, but not limited to, bleeding, infection, perforation, adverse medication reaction leading to cardiac or pulmonary decompensation, pancreatitis (if ERCP).  The limitation of incomplete mucosal visualization was also discussed.  No guarantees or warranties were given. Patient at increased risk for cardiopulmonary complications of procedure due to medical comorbidities.  Hold Coumadin 4 days prior, and we will request that primary care give her recommendations on any necessary bridging therapy.   [name]  CC: Referring provider noted above

## 2022-03-10 NOTE — Patient Instructions (Addendum)
Pre visit review using our clinic review tool, if applicable. No additional management support is needed unless otherwise documented below in the visit note.  Continue1 tablet daily except take 1 1/2 tablets on Saturdays. Recheck in 2 weeks. Advised pt of dosing and recheck in 2 weeks. P

## 2022-03-11 ENCOUNTER — Encounter: Payer: Self-pay | Admitting: Gastroenterology

## 2022-03-11 ENCOUNTER — Ambulatory Visit: Payer: Medicare HMO | Admitting: Gastroenterology

## 2022-03-11 ENCOUNTER — Ambulatory Visit: Payer: Medicare HMO | Admitting: Obstetrics and Gynecology

## 2022-03-11 ENCOUNTER — Telehealth: Payer: Self-pay

## 2022-03-11 VITALS — BP 120/60 | HR 59 | Ht 64.0 in | Wt 311.0 lb

## 2022-03-11 DIAGNOSIS — R1084 Generalized abdominal pain: Secondary | ICD-10-CM

## 2022-03-11 DIAGNOSIS — R140 Abdominal distension (gaseous): Secondary | ICD-10-CM

## 2022-03-11 DIAGNOSIS — Z7902 Long term (current) use of antithrombotics/antiplatelets: Secondary | ICD-10-CM | POA: Diagnosis not present

## 2022-03-11 DIAGNOSIS — T402X5A Adverse effect of other opioids, initial encounter: Secondary | ICD-10-CM

## 2022-03-11 DIAGNOSIS — Z86010 Personal history of colonic polyps: Secondary | ICD-10-CM

## 2022-03-11 DIAGNOSIS — K5903 Drug induced constipation: Secondary | ICD-10-CM | POA: Diagnosis not present

## 2022-03-11 DIAGNOSIS — R6889 Other general symptoms and signs: Secondary | ICD-10-CM | POA: Diagnosis not present

## 2022-03-11 MED ORDER — GOLYTELY 236 G PO SOLR: 4000.0000 mL | Freq: Once | ORAL | 0 refills | Status: AC

## 2022-03-11 NOTE — Telephone Encounter (Signed)
[facility] Pre-operative Risk Assessment     Request for surgical clearance:     Endoscopy Procedure  What type of surgery is being performed?     Colonoscopy  When is this surgery scheduled?     04/21/22  What type of clearance is required ?   Pharmacy  Are there any medications that need to be held prior to surgery and how long? Coumadin & 4 days  Practice name and name of physician performing surgery?      [facility]  What is your office phone and fax number?      Phone- [phone]  [phone]  Anesthesia type (None, local, MAC, general) ?       MAC

## 2022-03-11 NOTE — Telephone Encounter (Signed)
Okay to hold warfarin x4 days for colonoscopy    Binnie Rail, MD

## 2022-03-11 NOTE — Patient Instructions (Signed)
_______________________________________________________  If you are age 68 or older, your body mass index should be between 23-30. Your Body mass index is 53.38 kg/m. If this is out of the aforementioned range listed, please consider follow up with your Primary Care Provider.  If you are age 49 or younger, your body mass index should be between 19-25. Your Body mass index is 53.38 kg/m. If this is out of the aformentioned range listed, please consider follow up with your Primary Care Provider.   ________________________________________________________  The [facility] providers would like to encourage you to use [facility] to communicate with providers for non-urgent requests or questions.  Due to long hold times on the telephone, sending your provider a message by [facility] may be a faster and more efficient way to get a response.  Please allow 48 business hours for a response.  Please remember that this is for non-urgent requests.  _______________________________________________________   Dennis Bast have been scheduled for a colonoscopy. Please follow written instructions given to you at your visit today.  Please pick up your prep supplies at the pharmacy within the next 1-3 days. If you use inhalers (even only as needed), please bring them with you on the day of your procedure.   You will be contacted by our office prior to your procedure for directions on holding your Coumadin. If you do not hear from our office 1 week prior to your scheduled procedure, please call [phone] to discuss.    Due to recent changes in healthcare laws, you may see the results of your imaging and laboratory studies on MyChart before your provider has had a chance to review them.  We understand that in some cases there may be results that are confusing or concerning to you. Not all laboratory results come back in the same time frame and the provider may be waiting for multiple results in order to interpret others.  Please give  [place] 48 hours in order for your provider to thoroughly review all the results before contacting the office for clarification of your results.    It was a pleasure to see you today!  Thank you for trusting me with your gastrointestinal care!

## 2022-03-12 ENCOUNTER — Encounter: Payer: Self-pay | Admitting: Gastroenterology

## 2022-03-14 ENCOUNTER — Encounter: Payer: Self-pay | Admitting: Gastroenterology

## 2022-03-14 NOTE — Telephone Encounter (Signed)
Left patient a voicemail to call back regarding holding the Coumadin prior to procedure.

## 2022-03-15 NOTE — Telephone Encounter (Addendum)
Received call from pt late yesterday reporting she is scheduled for colonoscopy on 9/14 and will need warfarin instructions for a hold.  PCP has sent pt msg that it is ok to hold warfarin for 4 days. No instructions for after surgery. Proposed warfarin dosing after surgery below. Pt will not need a lovenox bridge. Pt's current warfarin dosing is 1 tablet (5 mg) daily except take 1 1/2 tablets (7.5 mg) on Saturdays.   9/9: Take last dose of warfarin 9/10: No warfarin 9/11: No warfarin 9/12: No warfarin 9/13: No warfarin 9/14: Surgery; No warfarin 9/15: Take 1 1/2 tablets (7.5 mg) warfarin 9/16: Take 2 tablets (10 mg) warfarin 9/17: Take 1 1/2 tablets (7.5 mg) warfarin 9/18: Take 1 1/2 tablets (7.5 mg) warfarin 9/19: Take 1 tablet (5 mg) warfarin 9/20: Take 1 tablet (5 mg) warfarin 9/21: Recheck INR

## 2022-03-15 NOTE — Telephone Encounter (Signed)
Looks good. Thank you.

## 2022-03-15 NOTE — Telephone Encounter (Signed)
Pt has been notified and aware via mychart. Please see additional info on the message form mychart on 03-14-2022

## 2022-03-21 ENCOUNTER — Encounter: Payer: Self-pay | Admitting: Internal Medicine

## 2022-03-23 ENCOUNTER — Ambulatory Visit (INDEPENDENT_AMBULATORY_CARE_PROVIDER_SITE_OTHER): Payer: Medicare HMO

## 2022-03-23 ENCOUNTER — Encounter: Payer: Self-pay | Admitting: Internal Medicine

## 2022-03-23 DIAGNOSIS — Z7901 Long term (current) use of anticoagulants: Secondary | ICD-10-CM

## 2022-03-23 DIAGNOSIS — G8929 Other chronic pain: Secondary | ICD-10-CM | POA: Diagnosis not present

## 2022-03-23 DIAGNOSIS — M6281 Muscle weakness (generalized): Secondary | ICD-10-CM | POA: Diagnosis not present

## 2022-03-23 LAB — POCT INR: INR: 2.4 (ref 2.0–3.0)

## 2022-03-23 NOTE — Patient Instructions (Addendum)
Pre visit review using our clinic review tool, if applicable. No additional management support is needed unless otherwise documented below in the visit note.  Continue1 tablet daily except take 1 1/2 tablets on Saturdays. Recheck in 2 weeks, on 8/30.

## 2022-03-23 NOTE — Progress Notes (Signed)
Pt has colonoscopy scheduled for 9/14 and will hold warfarin. Dosing for hold is below and pt has received dosing instructions. Pt will continue normal dosing until warfarin hold.  [employer] home monitoring result is 2.4 for today's INR. Continue1 tablet daily except take 1 1/2 tablets on Saturdays. Recheck in 2 weeks. Advised pt of dosing and recheck in 2 weeks. Pt verbalized understanding.   9/9: Take last dose of warfarin 9/10: No warfarin 9/11: No warfarin 9/12: No warfarin 9/13: No warfarin 9/14: Surgery; No warfarin 9/15: Take 1 1/2 tablets (7.5 mg) warfarin 9/16: Take 2 tablets (10 mg) warfarin 9/17: Take 1 1/2 tablets (7.5 mg) warfarin 9/18: Take 1 1/2 tablets (7.5 mg) warfarin 9/19: Take 1 tablet (5 mg) warfarin 9/20: Take 1 tablet (5 mg) warfarin 9/21: Recheck INR

## 2022-03-24 ENCOUNTER — Other Ambulatory Visit: Payer: Self-pay

## 2022-03-26 ENCOUNTER — Encounter: Payer: Self-pay | Admitting: Internal Medicine

## 2022-03-28 ENCOUNTER — Encounter: Payer: Self-pay | Admitting: Internal Medicine

## 2022-03-28 ENCOUNTER — Ambulatory Visit
Admission: RE | Admit: 2022-03-28 | Discharge: 2022-03-28 | Disposition: A | Discharge status: Home / Self Care | Payer: Medicare HMO | Source: Ambulatory Visit | Attending: Internal Medicine | Admitting: Internal Medicine

## 2022-03-28 DIAGNOSIS — M8580 Other specified disorders of bone density and structure, unspecified site: Secondary | ICD-10-CM | POA: Insufficient documentation

## 2022-03-28 DIAGNOSIS — Z780 Asymptomatic menopausal state: Secondary | ICD-10-CM

## 2022-03-28 DIAGNOSIS — M85852 Other specified disorders of bone density and structure, left thigh: Secondary | ICD-10-CM | POA: Diagnosis not present

## 2022-03-28 DIAGNOSIS — R6889 Other general symptoms and signs: Secondary | ICD-10-CM | POA: Diagnosis not present

## 2022-03-31 NOTE — Progress Notes (Signed)
Subjective:    Patient ID: [name], female    DOB: Apr 03, 1956, 66 y.o.   MRN: [id number]      HPI [name] is here for  Chief Complaint  Patient presents with   Urinary Tract Infection    Extreme pain with urination and pressure    ? UTI:  Her symptoms started 10 days ago.  She states dysuria, urinary frequency, urinary urgency, feeling of needing to bare down, abdominal pain, nausea.   She denies hematuria, new back pain, nausea, fever.  01/21/22 - Klebsiella pneumoniae - intermediate to nitrofurantoin.      Sacral pressure sore - had home visiting nurse who instructed her how to change bandage.  She needs bandages sent in.  She was thinking about the wound and thinks maybe her incontinence pad was rubbing on that area-she bought larger pads and that has helped - it is healing up. No recent discharge  Taking her thyroid medication daily.    Medications and allergies reviewed with patient and updated if appropriate.  Current Outpatient Medications on File Prior to Visit  Medication Sig Dispense Refill   bisacodyl (DULCOLAX) 5 MG EC tablet Take 30 mg by mouth daily as needed for moderate constipation.     HYDROmorphone (DILAUDID) 4 MG tablet TAKE 1 TO 2 TABLETS BY MOUTH EVERY 6 HOURS AS NEEDED FOR BREAKTHROUGH PAIN  0   levothyroxine (SYNTHROID) 200 MCG tablet TAKE 1 TABLET BY MOUTH EVERY DAY BEFORE BREAKFAST 90 tablet 0   levothyroxine (SYNTHROID) 50 MCG tablet TAKE 1 TABLET (50 MCG TOTAL) BY MOUTH DAILY BEFORE BREAKFAST. TAKE IN ADDITION TO 200 MCG PILL FOR A TOTAL OF 250 MCG DAILY 90 tablet 3   methadone (DOLOPHINE) 10 MG tablet Take 10 mg by mouth every 8 (eight) hours as needed. for pain  0   naloxone (NARCAN) nasal spray 4 mg/0.1 mL SMARTSIG:Both Nares     nystatin-triamcinolone ointment (MYCOLOG) Apply topically 2 (two) times daily. 30 g 2   RABEprazole (ACIPHEX) 20 MG tablet Take 20 mg by mouth daily as needed.     Skin Protectants, Misc. (INTERDRY 10"X36") SHEE  UAD 30 each 5   vitamin B-12 (CYANOCOBALAMIN) 1000 MCG tablet Take 1 tablet (1,000 mcg total) by mouth daily.     warfarin (COUMADIN) 5 MG tablet TAKE 1 AND 1/2 TABLETS DAILY EXCEPT TAKE 1 TABLET ON SUN AND THURSDAY OR TAKE AS DIRECTED BY ANTICOAGULATION CLINIC 120 tablet 1   No current facility-administered medications on file prior to visit.    Review of Systems  Constitutional:  Negative for fever.  Respiratory:  Negative for cough, shortness of breath and wheezing.   Cardiovascular:  Negative for chest pain, palpitations and leg swelling.  Gastrointestinal:  Positive for abdominal pain and nausea.  Genitourinary:  Positive for dysuria and frequency.  Neurological:  Negative for light-headedness and headaches.       Objective:   Vitals:   04/01/22 1406  BP: 114/64  Pulse: 65  Temp: 98 F (36.7 C)  SpO2: 97%   BP Readings from Last 3 Encounters:  04/01/22 114/64  03/11/22 120/60  01/21/22 118/60   Wt Readings from Last 3 Encounters:  04/01/22 (!) 310 lb (140.6 kg)  03/11/22 (!) 311 lb (141.1 kg)  01/21/22 (!) 310 lb (140.6 kg)   Body mass index is 53.21 kg/m.    Physical Exam Constitutional:      General: She is not in acute distress.    Appearance: Normal appearance.  HENT:     Head: Normocephalic and atraumatic.  Eyes:     Conjunctiva/sclera: Conjunctivae normal.  Cardiovascular:     Rate and Rhythm: Normal rate and regular rhythm.     Heart sounds: Normal heart sounds. No murmur heard. Pulmonary:     Effort: Pulmonary effort is normal. No respiratory distress.     Breath sounds: Normal breath sounds. No wheezing.  Abdominal:     General: There is no distension.     Palpations: Abdomen is soft.     Tenderness: There is no abdominal tenderness.  Musculoskeletal:     Cervical back: Neck supple.     Right lower leg: No edema.     Left lower leg: No edema.  Lymphadenopathy:     Cervical: No cervical adenopathy.  Skin:    General: Skin is warm and dry.      Findings: Erythema (Mild area of erythema left central mid buttock region with some scattered areas of scabbing.  No ulcer) present. No rash.  Neurological:     Mental Status: She is alert. Mental status is at baseline.  Psychiatric:        Mood and Affect: Mood normal.        Behavior: Behavior normal.            Assessment & Plan:    See Problem List for Assessment and Plan of chronic medical problems.

## 2022-04-01 ENCOUNTER — Encounter: Payer: Self-pay | Admitting: Internal Medicine

## 2022-04-01 ENCOUNTER — Other Ambulatory Visit: Payer: Self-pay

## 2022-04-01 ENCOUNTER — Ambulatory Visit (INDEPENDENT_AMBULATORY_CARE_PROVIDER_SITE_OTHER): Payer: Medicare HMO | Admitting: Internal Medicine

## 2022-04-01 VITALS — BP 114/64 | HR 65 | Temp 98.0°F | Ht 64.0 in | Wt 310.0 lb

## 2022-04-01 DIAGNOSIS — Z7901 Long term (current) use of anticoagulants: Secondary | ICD-10-CM | POA: Diagnosis not present

## 2022-04-01 DIAGNOSIS — E039 Hypothyroidism, unspecified: Secondary | ICD-10-CM | POA: Diagnosis not present

## 2022-04-01 DIAGNOSIS — R300 Dysuria: Secondary | ICD-10-CM

## 2022-04-01 DIAGNOSIS — L89151 Pressure ulcer of sacral region, stage 1: Secondary | ICD-10-CM | POA: Diagnosis not present

## 2022-04-01 DIAGNOSIS — R6889 Other general symptoms and signs: Secondary | ICD-10-CM | POA: Diagnosis not present

## 2022-04-01 DIAGNOSIS — N3000 Acute cystitis without hematuria: Secondary | ICD-10-CM | POA: Diagnosis not present

## 2022-04-01 LAB — CBC WITH DIFFERENTIAL/PLATELET
Basophils Absolute: 0 10*3/uL (ref 0.0–0.1)
Basophils Relative: 0.5 % (ref 0.0–3.0)
Eosinophils Absolute: 0.1 10*3/uL (ref 0.0–0.7)
Eosinophils Relative: 1.3 % (ref 0.0–5.0)
HCT: 39 % (ref 36.0–46.0)
Hemoglobin: 12.8 g/dL (ref 12.0–15.0)
Lymphocytes Relative: 20.2 % (ref 12.0–46.0)
Lymphs Abs: 1 10*3/uL (ref 0.7–4.0)
MCHC: 32.8 g/dL (ref 30.0–36.0)
MCV: 87.8 fl (ref 78.0–100.0)
Monocytes Absolute: 0.5 10*3/uL (ref 0.1–1.0)
Monocytes Relative: 9.9 % (ref 3.0–12.0)
Neutro Abs: 3.3 10*3/uL (ref 1.4–7.7)
Neutrophils Relative %: 68.1 % (ref 43.0–77.0)
Platelets: 169 10*3/uL (ref 150.0–400.0)
RBC: 4.45 Mil/uL (ref 3.87–5.11)
RDW: 14 % (ref 11.5–15.5)
WBC: 4.9 10*3/uL (ref 4.0–10.5)

## 2022-04-01 LAB — COMPREHENSIVE METABOLIC PANEL
ALT: 10 U/L (ref 0–35)
AST: 15 U/L (ref 0–37)
Albumin: 3.8 g/dL (ref 3.5–5.2)
Alkaline Phosphatase: 84 U/L (ref 39–117)
BUN: 7 mg/dL (ref 6–23)
CO2: 32 mEq/L (ref 19–32)
Calcium: 9.1 mg/dL (ref 8.4–10.5)
Chloride: 99 mEq/L (ref 96–112)
Creatinine, Ser: 0.69 mg/dL (ref 0.40–1.20)
GFR: 89.26 mL/min (ref >60.00)
Glucose, Bld: 80 mg/dL (ref 70–99)
Potassium: 3.7 mEq/L (ref 3.5–5.1)
Sodium: 136 mEq/L (ref 135–145)
Total Bilirubin: 1.1 mg/dL (ref 0.2–1.2)
Total Protein: 7.7 g/dL (ref 6.0–8.3)

## 2022-04-01 LAB — POC URINALSYSI DIPSTICK (AUTOMATED)
Bilirubin, UA: NEGATIVE
Glucose, UA: NEGATIVE
Ketones, UA: NEGATIVE
Nitrite, UA: NEGATIVE
Protein, UA: NEGATIVE
Spec Grav, UA: 1.015 (ref 1.010–1.025)
Urobilinogen, UA: 0.2 E.U./dL
pH, UA: 6.5 (ref 5.0–8.0)

## 2022-04-01 LAB — TSH: TSH: 0.23 u[IU]/mL — ABNORMAL LOW (ref 0.35–5.50)

## 2022-04-01 MED ORDER — CEPHALEXIN 500 MG PO CAPS: 500.0000 mg | ORAL_CAPSULE | Freq: Two times a day (BID) | ORAL | 0 refills | Status: DC

## 2022-04-01 NOTE — Assessment & Plan Note (Addendum)
chronic Stage 1 - improved - mild erythema, scabbing Likely related to pad rubbing on the area  - larger pad is helping and it is healing Non tender, no open wound/discharge Monitor for now - no dressing needed

## 2022-04-01 NOTE — Assessment & Plan Note (Signed)
Chronic On warfarin for lifelong anticoagulation for history of PE, DVT Management per our Coumadin clinic Going on Keflex so can check INR at home CBC, CMP

## 2022-04-01 NOTE — Assessment & Plan Note (Signed)
Acute Urine dip consistent with UTI Will send urine for culture Take the antibiotic as prescribed.  Keflex 500 mg bid x 7 days Take tylenol if needed.   Increase your water intake.  Call if no improvement

## 2022-04-01 NOTE — Patient Instructions (Addendum)
     Blood work ordered for today.    Monitor your pressure sore.  .     Medications changes include :   keflex twice daily for one week   Your prescription(s) have been sent to your pharmacy.      Return in about 6 months (around 10/02/2022) for Physical Exam.

## 2022-04-01 NOTE — Assessment & Plan Note (Signed)
Chronic  Clinically euthyroid Currently taking levothyroxine 50 mcg daily Check tsh  Titrate med dose if needed  

## 2022-04-02 ENCOUNTER — Encounter: Payer: Self-pay | Admitting: Internal Medicine

## 2022-04-03 ENCOUNTER — Other Ambulatory Visit: Payer: Self-pay | Admitting: Internal Medicine

## 2022-04-03 ENCOUNTER — Encounter: Payer: Self-pay | Admitting: Internal Medicine

## 2022-04-03 MED ORDER — LEVOTHYROXINE SODIUM 50 MCG PO TABS: 25.0000 ug | ORAL_TABLET | Freq: Every day | ORAL | 3 refills | Status: DC

## 2022-04-04 ENCOUNTER — Telehealth: Payer: Self-pay

## 2022-04-04 LAB — CULTURE, URINE COMPREHENSIVE

## 2022-04-04 NOTE — Telephone Encounter (Signed)
Pt reports she was started on cephalexin for UTI and started on 8/29. Pt has also had levothyroxine dose decreased by 25 mcg.  Advised to check INR on 8/30. Pt verbalized understanding.

## 2022-04-06 ENCOUNTER — Ambulatory Visit (INDEPENDENT_AMBULATORY_CARE_PROVIDER_SITE_OTHER): Payer: Medicare HMO

## 2022-04-06 DIAGNOSIS — Z7901 Long term (current) use of anticoagulants: Secondary | ICD-10-CM

## 2022-04-06 LAB — POCT INR: INR: 2 (ref 2.0–3.0)

## 2022-04-06 NOTE — Progress Notes (Signed)
I have reviewed and agree with note, evaluation, plan.   Aidian Salomon, MD  

## 2022-04-06 NOTE — Patient Instructions (Addendum)
Pre visit review using our clinic review tool, if applicable. No additional management support is needed unless otherwise documented below in the visit note.  Continue 1 tablet daily except take 1 1/2 tablets on Saturdays. Recheck in 1 weeks, on 9/6. Advised pt of dosing and recheck in 1 weeks.

## 2022-04-06 NOTE — Progress Notes (Signed)
Pt reports she was started on cephalexin for UTI and started on 8/29. Pt has also had levothyroxine dose decreased by 25 mcg.  [employer] home monitoring result is 2.0 for today's INR. Continue 1 tablet daily except take 1 1/2 tablets on Saturdays. Recheck in 1 weeks, on 9/6. Advised pt of dosing and recheck in 1 weeks. Pt verbalized understanding.

## 2022-04-07 ENCOUNTER — Encounter: Payer: Self-pay | Admitting: Gastroenterology

## 2022-04-13 ENCOUNTER — Telehealth: Payer: Self-pay

## 2022-04-13 NOTE — Telephone Encounter (Signed)
Called and spoke with patient to confirm colonoscopy at [facility] on 04/21/22 at 11 am. Pt is aware that she will need to arrive at 9:30 am with a care partner. Pt is still trying to secure transportation. She states that she reached out to [facility] last week and they were going to call her back to discuss her request further. Pt has not heard from [facility]. I advised pt to call them back to follow up on request. I told pt that if she does not have a care partner we will have to cancel her upcoming appt. Pt did confirm that she has her prep and instructions. Pt will update [place] once she has more information from [facility]. Pt had no concerns at the end of the call.

## 2022-04-14 ENCOUNTER — Encounter (HOSPITAL_COMMUNITY): Payer: Self-pay | Admitting: Gastroenterology

## 2022-04-14 NOTE — Telephone Encounter (Signed)
See 04/13/22 telephone encounter for details.

## 2022-04-14 NOTE — Telephone Encounter (Signed)
Pt is requesting a call to reschedule her procedure.

## 2022-04-14 NOTE — Telephone Encounter (Signed)
Patient sent MyChart message.  She needs to cancel 9/14 procedure. She can do appt in October or November.

## 2022-04-15 ENCOUNTER — Telehealth: Payer: Self-pay

## 2022-04-15 NOTE — Telephone Encounter (Signed)
Pt was to have colonoscopy on 9/14 and test INR yesterday and obtain instructions for holding warfarin. Colonoscopy has been cancelled. Pt has not sent in INR result.   LVM for pt to return call.

## 2022-04-17 ENCOUNTER — Encounter: Payer: Self-pay | Admitting: Internal Medicine

## 2022-04-20 ENCOUNTER — Encounter: Payer: Self-pay | Admitting: Gastroenterology

## 2022-04-20 ENCOUNTER — Ambulatory Visit (INDEPENDENT_AMBULATORY_CARE_PROVIDER_SITE_OTHER): Payer: Medicare HMO

## 2022-04-20 DIAGNOSIS — Z7901 Long term (current) use of anticoagulants: Secondary | ICD-10-CM | POA: Diagnosis not present

## 2022-04-20 LAB — POCT INR: INR: 3 (ref 2.0–3.0)

## 2022-04-20 MED ORDER — SULFAMETHOXAZOLE-TRIMETHOPRIM 800-160 MG PO TABS: 1.0000 | ORAL_TABLET | Freq: Two times a day (BID) | ORAL | 0 refills | Status: AC

## 2022-04-20 NOTE — Patient Instructions (Addendum)
Pre visit review using our clinic review tool, if applicable. No additional management support is needed unless otherwise documented below in the visit note.   Take 1 tablet today, take 1 tablet tomorrow, take 1/2 tablet on Friday, take 1 1/2 tablets on Saturday, and take 1/2 tablet on Sunday. Recheck on Monday, 9/18

## 2022-04-20 NOTE — Progress Notes (Signed)
[employer] home monitoring result is 3.0 for today's INR. Continue 1 tablet daily except take 1 1/2 tablets on Saturdays.  Recheck INR in 2 weeks (05/04/22).

## 2022-04-20 NOTE — Progress Notes (Signed)
Pt called and reported she has been prescribed Bactrim for UTI and will start abx today. Major interaction with warfarin. Will make dose changes and have pt recheck on 9/18. [employer] home monitoring result is 3.0 for today's INR. Take 1 tablet today, take 1 tablet tomorrow, take 1/2 tablet on Friday, take 1 1/2 tablets on Saturday, and take 1/2 tablet on Sunday. Recheck on Monday, 9/18

## 2022-04-21 ENCOUNTER — Ambulatory Visit (HOSPITAL_COMMUNITY): Admit: 2022-04-21 | Payer: Medicare HMO | Admitting: Gastroenterology

## 2022-04-21 ENCOUNTER — Encounter (HOSPITAL_COMMUNITY): Payer: Self-pay

## 2022-04-21 SURGERY — COLONOSCOPY WITH PROPOFOL
Anesthesia: Monitor Anesthesia Care

## 2022-04-23 DIAGNOSIS — G8929 Other chronic pain: Secondary | ICD-10-CM | POA: Diagnosis not present

## 2022-04-23 DIAGNOSIS — M6281 Muscle weakness (generalized): Secondary | ICD-10-CM | POA: Diagnosis not present

## 2022-04-25 ENCOUNTER — Ambulatory Visit (INDEPENDENT_AMBULATORY_CARE_PROVIDER_SITE_OTHER): Payer: Medicare HMO

## 2022-04-25 DIAGNOSIS — Z7901 Long term (current) use of anticoagulants: Secondary | ICD-10-CM

## 2022-04-25 LAB — POCT INR: INR: 2 (ref 2.0–3.0)

## 2022-04-25 NOTE — Patient Instructions (Addendum)
Pre visit review using our clinic review tool, if applicable. No additional management support is needed unless otherwise documented below in the visit note.  Continue 1 tablet daily except take 1 1/2  tablets on Saturdays. Recheck in 2 weeks, 10/2.

## 2022-04-25 NOTE — Progress Notes (Addendum)
Pt called and reported she has been prescribed Bactrim for UTI and will take last dose today. A decrease in dosing has been advised last week. Pt retested today with home INR machine with 2.0 for INR.  Continue 1 tablet daily except take 1 1/2  tablets on Saturdays. Recheck in 2 weeks, 10/2.

## 2022-05-06 ENCOUNTER — Telehealth: Payer: Self-pay

## 2022-05-06 DIAGNOSIS — Z7902 Long term (current) use of antithrombotics/antiplatelets: Secondary | ICD-10-CM

## 2022-05-06 DIAGNOSIS — T402X5A Adverse effect of other opioids, initial encounter: Secondary | ICD-10-CM

## 2022-05-06 DIAGNOSIS — R1084 Generalized abdominal pain: Secondary | ICD-10-CM

## 2022-05-06 DIAGNOSIS — R140 Abdominal distension (gaseous): Secondary | ICD-10-CM

## 2022-05-06 DIAGNOSIS — Z86010 Personal history of colonic polyps: Secondary | ICD-10-CM

## 2022-05-06 NOTE — Telephone Encounter (Signed)
[facility] Pre-operative Risk Assessment     Request for surgical clearance:     Endoscopy Procedure  What type of surgery is being performed?     Colonoscopy  When is this surgery scheduled?     06/16/22  What type of clearance is required ?   Pharmacy  Are there any medications that need to be held prior to surgery and how long? Warfarin - 5 days  Practice name and name of physician performing surgery?      [facility]  What is your office phone and fax number?      Phone- [phone]  [phone]  Anesthesia type (None, local, MAC, general) ?       MAC

## 2022-05-06 NOTE — Telephone Encounter (Signed)
Left patient a vm offering her a colonoscopy appt at [facility] on Thursday, 06/16/22 at 8:30 am. Pt will need to arrive at 7 am with a care partner.  MyChart message also sent to patient.

## 2022-05-08 ENCOUNTER — Encounter: Payer: Self-pay | Admitting: Internal Medicine

## 2022-05-09 NOTE — Telephone Encounter (Signed)
Patient responded to MyChart message. OK with appt on 11/9 at [facility]. Patient is aware that I will mail instructions once cardiac clearance is obtained to hold Warfarin.

## 2022-05-11 ENCOUNTER — Ambulatory Visit (INDEPENDENT_AMBULATORY_CARE_PROVIDER_SITE_OTHER): Payer: Medicare HMO

## 2022-05-11 DIAGNOSIS — Z7901 Long term (current) use of anticoagulants: Secondary | ICD-10-CM

## 2022-05-11 LAB — POCT INR: INR: 1.7 — AB (ref 2.0–3.0)

## 2022-05-11 NOTE — Telephone Encounter (Signed)
Noted, thank you

## 2022-05-11 NOTE — Progress Notes (Signed)
Pt tests at home and submits results via [employer] portal. INR today is 1.7.   Increase dose today to 1 1/2 tablets and increase dose tomorrow to 1 1/2 tablets. Then continue 1 tablet daily except take 1 1/2  tablets on Saturdays. Recheck in 2 weeks, 10/18. Pt called and given instructions, verbalized understanding.   Pt has appt with Dr. Quay Burow on 10/11 for potential UTI. Pt states she will call anticoagulation clinic if prescribed more antibiotics at that time.

## 2022-05-11 NOTE — Patient Instructions (Signed)
Increase dose today to 1 1/2 tablets and increase dose tomorrow to 1 1/2 tablets. Then continue 1 tablet daily except take 1 1/2  tablets on Saturdays. Recheck in 2 weeks, 10/18.

## 2022-05-11 NOTE — Telephone Encounter (Signed)
Per Dr. Quay Burow - OK to hold Warfarin 5 days prior to colonoscopy. Patient notified via [place]. Colonoscopy instructions mailed and sent to patient via [place].

## 2022-05-11 NOTE — Telephone Encounter (Signed)
Okay to hold warfarin for 5 days prior to colonoscopy.   Binnie Rail, MD

## 2022-05-15 ENCOUNTER — Other Ambulatory Visit: Payer: Self-pay | Admitting: Internal Medicine

## 2022-05-17 NOTE — Progress Notes (Unsigned)
    Subjective:    Patient ID: [name], female    DOB: 1956/06/06, 66 y.o.   MRN: [id number]      HPI [name] is here for No chief complaint on file.    ? UTI:  Her symptoms started  *** days ago.  She states dysuria, urinary frequency, urinary urgency, hematuria, abdominal pain, back pain, nausea, fever.  She denies other symptoms.      Medications and allergies reviewed with patient and updated if appropriate.  Current Outpatient Medications on File Prior to Visit  Medication Sig Dispense Refill   bisacodyl (DULCOLAX) 5 MG EC tablet Take 30 mg by mouth daily as needed for moderate constipation.     HYDROmorphone (DILAUDID) 4 MG tablet TAKE 1 TO 2 TABLETS BY MOUTH EVERY 6 HOURS AS NEEDED FOR BREAKTHROUGH PAIN  0   levothyroxine (SYNTHROID) 200 MCG tablet TAKE 1 TABLET BY MOUTH EVERY DAY BEFORE BREAKFAST 90 tablet 0   levothyroxine (SYNTHROID) 50 MCG tablet Take 0.5 tablets (25 mcg total) by mouth daily before breakfast. Take in addition to 200 mcg pill for a total of 225 mcg daily 90 tablet 3   methadone (DOLOPHINE) 10 MG tablet Take 10 mg by mouth every 8 (eight) hours as needed. for pain  0   naloxone (NARCAN) nasal spray 4 mg/0.1 mL SMARTSIG:Both Nares     nystatin-triamcinolone ointment (MYCOLOG) Apply topically 2 (two) times daily. 30 g 2   RABEprazole (ACIPHEX) 20 MG tablet Take 20 mg by mouth daily as needed.     Skin Protectants, Misc. (INTERDRY 10"X36") SHEE UAD 30 each 5   vitamin B-12 (CYANOCOBALAMIN) 1000 MCG tablet Take 1 tablet (1,000 mcg total) by mouth daily.     warfarin (COUMADIN) 5 MG tablet TAKE 1 AND 1/2 TABLETS DAILY EXCEPT TAKE 1 TABLET ON SUN AND THURSDAY OR TAKE AS DIRECTED BY ANTICOAGULATION CLINIC 120 tablet 1   No current facility-administered medications on file prior to visit.    Review of Systems     Objective:  There were no vitals filed for this visit. BP Readings from Last 3 Encounters:  04/01/22 114/64  03/11/22 120/60  01/21/22  118/60   Wt Readings from Last 3 Encounters:  04/01/22 (!) 310 lb (140.6 kg)  03/11/22 (!) 311 lb (141.1 kg)  01/21/22 (!) 310 lb (140.6 kg)   There is no height or weight on file to calculate BMI.    Physical Exam         Assessment & Plan:    See Problem List for Assessment and Plan of chronic medical problems.

## 2022-05-18 ENCOUNTER — Telehealth: Payer: Self-pay

## 2022-05-18 ENCOUNTER — Ambulatory Visit (INDEPENDENT_AMBULATORY_CARE_PROVIDER_SITE_OTHER): Payer: Medicare HMO | Admitting: Internal Medicine

## 2022-05-18 ENCOUNTER — Encounter: Payer: Self-pay | Admitting: Internal Medicine

## 2022-05-18 VITALS — BP 116/70 | HR 70 | Temp 98.5°F | Ht 64.0 in | Wt 317.0 lb

## 2022-05-18 DIAGNOSIS — R300 Dysuria: Secondary | ICD-10-CM

## 2022-05-18 DIAGNOSIS — R6889 Other general symptoms and signs: Secondary | ICD-10-CM | POA: Diagnosis not present

## 2022-05-18 DIAGNOSIS — N3000 Acute cystitis without hematuria: Secondary | ICD-10-CM

## 2022-05-18 LAB — POC URINALSYSI DIPSTICK (AUTOMATED)
Bilirubin, UA: NEGATIVE
Glucose, UA: NEGATIVE
Ketones, UA: NEGATIVE
Nitrite, UA: NEGATIVE
Protein, UA: NEGATIVE
Spec Grav, UA: 1.01 (ref 1.010–1.025)
Urobilinogen, UA: 0.2 E.U./dL
pH, UA: 7 (ref 5.0–8.0)

## 2022-05-18 MED ORDER — AMOXICILLIN-POT CLAVULANATE 875-125 MG PO TABS: 1.0000 | ORAL_TABLET | Freq: Two times a day (BID) | ORAL | 0 refills | Status: AC

## 2022-05-18 MED ORDER — SULFAMETHOXAZOLE-TRIMETHOPRIM 800-160 MG PO TABS: 1.0000 | ORAL_TABLET | Freq: Two times a day (BID) | ORAL | 0 refills | Status: DC

## 2022-05-18 NOTE — Telephone Encounter (Signed)
Augmentin sent to pharmacy.

## 2022-05-18 NOTE — Telephone Encounter (Signed)
Pt called to let [facility] know that she has been prescribed Bactrim 80-160 mg, two tablets by mouth daily for 7 days.   Instructed patient to hold warfarin today and decrease weekly dose to one 5 mg tablet by mouth once daily. Patient will recheck her INR at home on 10/16 and call results to Coumadin Clinic.

## 2022-05-18 NOTE — Patient Instructions (Signed)
Take the antibiotic as prescribed.  Take tylenol if needed.     Increase your water intake.   Call if no improvement     Urinary Tract Infection, Adult A urinary tract infection (UTI) is an infection of any part of the urinary tract, which includes the kidneys, ureters, bladder, and urethra. These organs make, store, and get rid of urine in the body. UTI can be a bladder infection (cystitis) or kidney infection (pyelonephritis). What are the causes? This infection may be caused by fungi, viruses, or bacteria. Bacteria are the most common cause of UTIs. This condition can also be caused by repeated incomplete emptying of the bladder during urination. What increases the risk? This condition is more likely to develop if:  You ignore your need to urinate or hold urine for long periods of time.  You do not empty your bladder completely during urination.  You wipe back to front after urinating or having a bowel movement, if you are female.  You are uncircumcised, if you are female.  You are constipated.  You have a urinary catheter that stays in place (indwelling).  You have a weak defense (immune) system.  You have a medical condition that affects your bowels, kidneys, or bladder.  You have diabetes.  You take antibiotic medicines frequently or for long periods of time, and the antibiotics no longer work well against certain types of infections (antibiotic resistance).  You take medicines that irritate your urinary tract.  You are exposed to chemicals that irritate your urinary tract.  You are female.  What are the signs or symptoms? Symptoms of this condition include:  Fever.  Frequent urination or passing small amounts of urine frequently.  Needing to urinate urgently.  Pain or burning with urination.  Urine that smells bad or unusual.  Cloudy urine.  Pain in the lower abdomen or back.  Trouble urinating.  Blood in the urine.  Vomiting or being less hungry than  normal.  Diarrhea or abdominal pain.  Vaginal discharge, if you are female.  How is this diagnosed? This condition is diagnosed with a medical history and physical exam. You will also need to provide a urine sample to test your urine. Other tests may be done, including:  Blood tests.  Sexually transmitted disease (STD) testing.  If you have had more than one UTI, a cystoscopy or imaging studies may be done to determine the cause of the infections. How is this treated? Treatment for this condition often includes a combination of two or more of the following:  Antibiotic medicine.  Other medicines to treat less common causes of UTI.  Over-the-counter medicines to treat pain.  Drinking enough water to stay hydrated.  Follow these instructions at home:  Take over-the-counter and prescription medicines only as told by your health care provider.  If you were prescribed an antibiotic, take it as told by your health care provider. Do not stop taking the antibiotic even if you start to feel better.  Avoid alcohol, caffeine, tea, and carbonated beverages. They can irritate your bladder.  Drink enough fluid to keep your urine clear or pale yellow.  Keep all follow-up visits as told by your health care provider. This is important.  Make sure to: ? Empty your bladder often and completely. Do not hold urine for long periods of time. ? Empty your bladder before and after sex. ? Wipe from front to back after a bowel movement if you are female. Use each tissue one time when you   wipe. Contact a health care provider if:  You have back pain.  You have a fever.  You feel nauseous or vomit.  Your symptoms do not get better after 3 days.  Your symptoms go away and then return. Get help right away if:  You have severe back pain or lower abdominal pain.  You are vomiting and cannot keep down any medicines or water. This information is not intended to replace advice given to you by  your health care provider. Make sure you discuss any questions you have with your health care provider. Document Released: 05/04/2005 Document Revised: 01/06/2016 Document Reviewed: 06/15/2015 Elsevier Interactive Patient Education  2018 Elsevier Inc.   

## 2022-05-18 NOTE — Assessment & Plan Note (Addendum)
Acute Urine dip consistent with UTI Will send urine for culture Take the antibiotic as prescribed.  Bactrim DS bid x 7 days Take tylenol if needed.   Increase your water intake.  Call if no improvement   Discussed seeing urology or uro-gyn for recurrent UTIs

## 2022-05-18 NOTE — Telephone Encounter (Signed)
Patient called in wanting to advise Dr.Burns that insurance isnt paying for the UTI medication that was called in.

## 2022-05-21 LAB — CULTURE, URINE COMPREHENSIVE

## 2022-05-23 ENCOUNTER — Ambulatory Visit (INDEPENDENT_AMBULATORY_CARE_PROVIDER_SITE_OTHER): Payer: Medicare HMO

## 2022-05-23 DIAGNOSIS — Z7901 Long term (current) use of anticoagulants: Secondary | ICD-10-CM

## 2022-05-23 LAB — POCT INR: INR: 1.8 — AB (ref 2.0–3.0)

## 2022-05-23 NOTE — Patient Instructions (Signed)
Increase dose today to 1 1/2 tablets. Then return to previous weekly dosing of 1 tablet daily except take 1 1/2  tablets on Saturdays. Recheck in 1 week, on 10/23.

## 2022-05-23 NOTE — Progress Notes (Signed)
On 10/11, pt called to let Coumadin Clinic know that she was prescribed Bactrim 80-160 mg, two tablets by mouth daily for 7 days. Instructed patient at that time to hold warfarin that day (10/11) and decrease weekly dose to one 5 mg tablet by mouth once daily and recheck INR at home on 10/16 and call results to Coumadin Clinic.  Patient checked INR with home [employer] analyzer and submitted results via portal today. INR is 1.8.   Pt states she missed her dose on 05/21/22.  Pt instructed to increase dose today to 1 1/2 tablets. Then return to previous weekly dosing of 1 tablet daily except take 1 1/2  tablets on Saturdays. Recheck in 1 week, on 10/23. Pt called and given instructions, verbalized understanding.

## 2022-05-24 ENCOUNTER — Encounter: Payer: Self-pay | Admitting: Internal Medicine

## 2022-05-30 ENCOUNTER — Ambulatory Visit (INDEPENDENT_AMBULATORY_CARE_PROVIDER_SITE_OTHER): Payer: Medicare HMO

## 2022-05-30 ENCOUNTER — Encounter: Payer: Self-pay | Admitting: *Deleted

## 2022-05-30 DIAGNOSIS — Z7901 Long term (current) use of anticoagulants: Secondary | ICD-10-CM

## 2022-05-30 LAB — POCT INR: INR: 1.7 — AB (ref 2.0–3.0)

## 2022-05-30 NOTE — Patient Instructions (Signed)
Increase dose today to 1 1/2 tablets. Then change weekly dosing to 1 tablet daily except take 1 1/2 tablets on Thursdays and Saturdays. Recheck in 1 week, on 10/30.

## 2022-05-30 NOTE — Progress Notes (Signed)
Patient checked INR with home [employer] analyzer and submitted results via portal today. INR is 1.7.   Pt instructed to increase dose today to 1 1/2 tablets. Then change weekly dosing to 1 tablet daily except take 1 1/2 tablets on Thursdays and Saturdays. Recheck in 1 week, on 10/30. Pt called and given instructions, verbalized understanding.

## 2022-05-31 ENCOUNTER — Encounter: Payer: Self-pay | Admitting: Internal Medicine

## 2022-05-31 NOTE — Progress Notes (Unsigned)
      Subjective:    Patient ID: [name], female    DOB: 05-27-1956, 66 y.o.   MRN: [id number]     HPI [name] is here for follow up of her chronic medical problems, including sacral ulcer    Medications and allergies reviewed with patient and updated if appropriate.  Current Outpatient Medications on File Prior to Visit  Medication Sig Dispense Refill   bisacodyl (DULCOLAX) 5 MG EC tablet Take 30 mg by mouth daily as needed for moderate constipation.     HYDROmorphone (DILAUDID) 4 MG tablet TAKE 1 TO 2 TABLETS BY MOUTH EVERY 6 HOURS AS NEEDED FOR BREAKTHROUGH PAIN  0   levothyroxine (SYNTHROID) 200 MCG tablet TAKE 1 TABLET BY MOUTH EVERY DAY BEFORE BREAKFAST 90 tablet 0   levothyroxine (SYNTHROID) 50 MCG tablet Take 0.5 tablets (25 mcg total) by mouth daily before breakfast. Take in addition to 200 mcg pill for a total of 225 mcg daily 90 tablet 3   methadone (DOLOPHINE) 10 MG tablet Take 10 mg by mouth every 8 (eight) hours as needed. for pain  0   naloxone (NARCAN) nasal spray 4 mg/0.1 mL SMARTSIG:Both Nares     nystatin-triamcinolone ointment (MYCOLOG) Apply topically 2 (two) times daily. 30 g 2   RABEprazole (ACIPHEX) 20 MG tablet Take 20 mg by mouth daily as needed.     Skin Protectants, Misc. (INTERDRY 10"X36") SHEE UAD 30 each 5   vitamin B-12 (CYANOCOBALAMIN) 1000 MCG tablet Take 1 tablet (1,000 mcg total) by mouth daily.     warfarin (COUMADIN) 5 MG tablet TAKE 1 AND 1/2 TABLETS DAILY EXCEPT TAKE 1 TABLET ON SUN AND THURSDAY OR TAKE AS DIRECTED BY ANTICOAGULATION CLINIC 120 tablet 1   No current facility-administered medications on file prior to visit.     Review of Systems     Objective:  There were no vitals filed for this visit. BP Readings from Last 3 Encounters:  05/18/22 116/70  04/01/22 114/64  03/11/22 120/60   Wt Readings from Last 3 Encounters:  05/18/22 (!) 317 lb (143.8 kg)  04/01/22 (!) 310 lb (140.6 kg)  03/11/22 (!) 311 lb (141.1 kg)    There is no height or weight on file to calculate BMI.    Physical Exam     Lab Results  Component Value Date   WBC 4.9 04/01/2022   HGB 12.8 04/01/2022   HCT 39.0 04/01/2022   PLT 169.0 04/01/2022   GLUCOSE 80 04/01/2022   CHOL 119 10/12/2021   TRIG 102.0 10/12/2021   HDL 29.40 (L) 10/12/2021   LDLDIRECT 105.0 05/24/2019   LDLCALC 70 10/12/2021   ALT 10 04/01/2022   AST 15 04/01/2022   NA 136 04/01/2022   K 3.7 04/01/2022   CL 99 04/01/2022   CREATININE 0.69 04/01/2022   BUN 7 04/01/2022   CO2 32 04/01/2022   TSH 0.23 (L) 04/01/2022   INR 1.7 (A) 05/30/2022   HGBA1C 5.5 04/17/2018     Assessment & Plan:    See Problem List for Assessment and Plan of chronic medical problems.

## 2022-06-01 ENCOUNTER — Other Ambulatory Visit: Payer: Self-pay

## 2022-06-01 ENCOUNTER — Ambulatory Visit (INDEPENDENT_AMBULATORY_CARE_PROVIDER_SITE_OTHER): Payer: Medicare HMO | Admitting: Internal Medicine

## 2022-06-01 VITALS — BP 108/62 | HR 67 | Temp 98.5°F | Ht 64.0 in | Wt 315.0 lb

## 2022-06-01 DIAGNOSIS — Z7901 Long term (current) use of anticoagulants: Secondary | ICD-10-CM | POA: Diagnosis not present

## 2022-06-01 DIAGNOSIS — Z23 Encounter for immunization: Secondary | ICD-10-CM

## 2022-06-01 DIAGNOSIS — R300 Dysuria: Secondary | ICD-10-CM | POA: Diagnosis not present

## 2022-06-01 DIAGNOSIS — L97901 Non-pressure chronic ulcer of unspecified part of unspecified lower leg limited to breakdown of skin: Secondary | ICD-10-CM | POA: Insufficient documentation

## 2022-06-01 DIAGNOSIS — E039 Hypothyroidism, unspecified: Secondary | ICD-10-CM | POA: Diagnosis not present

## 2022-06-01 DIAGNOSIS — R6889 Other general symptoms and signs: Secondary | ICD-10-CM | POA: Diagnosis not present

## 2022-06-01 LAB — POC URINALSYSI DIPSTICK (AUTOMATED)
Bilirubin, UA: NEGATIVE
Glucose, UA: NEGATIVE
Ketones, UA: NEGATIVE
Nitrite, UA: NEGATIVE
Protein, UA: NEGATIVE
Spec Grav, UA: 1.015 (ref 1.010–1.025)
Urobilinogen, UA: 0.2 E.U./dL
pH, UA: 6 (ref 5.0–8.0)

## 2022-06-01 LAB — TSH: TSH: 2.27 u[IU]/mL (ref 0.35–5.50)

## 2022-06-01 NOTE — Assessment & Plan Note (Signed)
Acute Has intermittent dysuria and bladder discomfort Has chronic abdominal pain so it is always hard to know when she has an infection, also has interstitial cystitis Urine dip here is questionable for infection No treatment at this time given minimal symptoms We will send urine for culture and only treat if positive

## 2022-06-01 NOTE — Assessment & Plan Note (Signed)
Chronic On warfarin long-term for history of PE, DVT Warfarin dosing managed by our Coumadin clinic Continue current dose of warfarin

## 2022-06-01 NOTE — Assessment & Plan Note (Addendum)
Chronic Due for follow-up TSH Clinically euthyroid Check tsh and will titrate med dose if needed Currently taking levothyroxine 225 mcg daily

## 2022-06-01 NOTE — Patient Instructions (Addendum)
    Flu immunization administered today.     Have blood work today.    You are eligible for a covid booster, shingles vaccine, RSV vaccine.  You should get those at the pharmacy.   Will send your urine for a culture.   Medications changes include :   none    Return in about 6 months (around 12/01/2022) for follow up.

## 2022-06-01 NOTE — Assessment & Plan Note (Signed)
Chronic Has chronic recurrent bilateral lower extremity ulcers secondary to venous insufficiency Currently ulcers are healed-there is a slight opening on each leg without active drainage or bleeding She has been monitoring these ulcers carefully and knows how to help them heal Continue to elevate legs and work on leg swelling Continue regular walking No treatment needed at this time-monitor only

## 2022-06-02 ENCOUNTER — Encounter: Payer: Self-pay | Admitting: Internal Medicine

## 2022-06-03 LAB — CULTURE, URINE COMPREHENSIVE: RESULT:: NO GROWTH

## 2022-06-04 ENCOUNTER — Encounter: Payer: Self-pay | Admitting: Internal Medicine

## 2022-06-06 ENCOUNTER — Ambulatory Visit (INDEPENDENT_AMBULATORY_CARE_PROVIDER_SITE_OTHER): Payer: Medicare HMO

## 2022-06-06 DIAGNOSIS — Z7901 Long term (current) use of anticoagulants: Secondary | ICD-10-CM | POA: Diagnosis not present

## 2022-06-06 LAB — POCT INR: INR: 2.4 (ref 2.0–3.0)

## 2022-06-06 NOTE — Patient Instructions (Signed)
Continue taking 1 tablet daily except take 1 1/2 tablets on Thursdays and Saturdays. Then follow the below instructions:   11/4: Take last dose of warfarin 11/5: No warfarin 11/6: No warfarin 11/7: No warfarin 11/8: No warfarin  11/9: Day of colonoscopy; No warfarin  11/10: Take 1 1/2 tablets (7.5 mg) warfarin 11/11: Take 2 tablets (10 mg) warfarin 11/12: Take 1 1/2 tablets (7.5 mg) warfarin 11/13: Take 1 1/2 tablets (7.5 mg) warfarin 11/14: Take 1 tablet (5 mg) warfarin 11/15: Take 1 tablet (5 mg) warfarin  11/16: Recheck INR

## 2022-06-06 NOTE — Progress Notes (Signed)
Patient checked INR with home [employer] analyzer and submitted results via portal today. INR is 2.4.   Pt instructed to continue taking 1 tablet daily except take 1 1/2 tablets on Thursdays and Saturdays.   Pt is having colonoscopy on 11/9 and will hold warfarin. Dosing for hold is below and pt has received dosing instructions. Pt will continue normal dosing until warfarin hold.    11/4: Take last dose of warfarin 11/5: No warfarin 11/6: No warfarin 11/7: No warfarin 11/8: No warfarin  11/9: Day of colonoscopy; No warfarin  11/10: Take 1 1/2 tablets (7.5 mg) warfarin 11/11: Take 2 tablets (10 mg) warfarin 11/12: Take 1 1/2 tablets (7.5 mg) warfarin 11/13: Take 1 1/2 tablets (7.5 mg) warfarin 11/14: Take 1 tablet (5 mg) warfarin 11/15: Take 1 tablet (5 mg) warfarin  11/16: Recheck INR  Reviewed above instructions with pt over the phone, verbalized understanding. Instructions also sent to pt via MyChart.

## 2022-06-08 ENCOUNTER — Other Ambulatory Visit: Payer: Self-pay | Admitting: Internal Medicine

## 2022-06-11 ENCOUNTER — Encounter: Payer: Self-pay | Admitting: Gastroenterology

## 2022-06-13 ENCOUNTER — Encounter: Payer: Self-pay | Admitting: Gastroenterology

## 2022-06-13 NOTE — Telephone Encounter (Signed)
Thanks for the note.  Let's do this:  Take 3 ducolax in the morning of prep day, then take another 3 in the afternoon, about 1-2 hrs before starting the liquid prep solution.  - HD

## 2022-06-14 ENCOUNTER — Telehealth: Payer: Self-pay

## 2022-06-14 NOTE — Telephone Encounter (Signed)
MyChart message sent to patient to confirm upcoming Colonoscopy appt at [facility] on 06/16/22. Patient reviewed and responded to message. Last read by [name] at 10:52 AM on 06/14/2022.

## 2022-06-16 ENCOUNTER — Ambulatory Visit (HOSPITAL_COMMUNITY): Payer: Medicare HMO | Admitting: Anesthesiology

## 2022-06-16 ENCOUNTER — Ambulatory Visit (HOSPITAL_COMMUNITY)
Admission: RE | Admit: 2022-06-16 | Discharge: 2022-06-16 | Disposition: A | Discharge status: Home / Self Care | Payer: Medicare HMO | Attending: Gastroenterology | Admitting: Gastroenterology

## 2022-06-16 ENCOUNTER — Encounter (HOSPITAL_COMMUNITY): Payer: Self-pay | Admitting: Gastroenterology

## 2022-06-16 ENCOUNTER — Encounter (HOSPITAL_COMMUNITY)
Admission: RE | Disposition: A | Discharge status: Home / Self Care | Payer: Self-pay | Source: Home / Self Care | Attending: Gastroenterology

## 2022-06-16 ENCOUNTER — Ambulatory Visit (HOSPITAL_BASED_OUTPATIENT_CLINIC_OR_DEPARTMENT_OTHER): Payer: Medicare HMO | Admitting: Anesthesiology

## 2022-06-16 DIAGNOSIS — Z87891 Personal history of nicotine dependence: Secondary | ICD-10-CM

## 2022-06-16 DIAGNOSIS — D649 Anemia, unspecified: Secondary | ICD-10-CM | POA: Insufficient documentation

## 2022-06-16 DIAGNOSIS — M1990 Unspecified osteoarthritis, unspecified site: Secondary | ICD-10-CM | POA: Insufficient documentation

## 2022-06-16 DIAGNOSIS — Z860100 Personal history of colon polyps, unspecified: Secondary | ICD-10-CM

## 2022-06-16 DIAGNOSIS — F419 Anxiety disorder, unspecified: Secondary | ICD-10-CM | POA: Diagnosis not present

## 2022-06-16 DIAGNOSIS — F32A Depression, unspecified: Secondary | ICD-10-CM | POA: Diagnosis not present

## 2022-06-16 DIAGNOSIS — D638 Anemia in other chronic diseases classified elsewhere: Secondary | ICD-10-CM | POA: Diagnosis not present

## 2022-06-16 DIAGNOSIS — K5730 Diverticulosis of large intestine without perforation or abscess without bleeding: Secondary | ICD-10-CM | POA: Diagnosis not present

## 2022-06-16 DIAGNOSIS — I739 Peripheral vascular disease, unspecified: Secondary | ICD-10-CM | POA: Diagnosis not present

## 2022-06-16 DIAGNOSIS — I2510 Atherosclerotic heart disease of native coronary artery without angina pectoris: Secondary | ICD-10-CM

## 2022-06-16 DIAGNOSIS — F418 Other specified anxiety disorders: Secondary | ICD-10-CM | POA: Diagnosis not present

## 2022-06-16 DIAGNOSIS — J449 Chronic obstructive pulmonary disease, unspecified: Secondary | ICD-10-CM | POA: Insufficient documentation

## 2022-06-16 DIAGNOSIS — Z09 Encounter for follow-up examination after completed treatment for conditions other than malignant neoplasm: Secondary | ICD-10-CM | POA: Diagnosis not present

## 2022-06-16 DIAGNOSIS — D126 Benign neoplasm of colon, unspecified: Secondary | ICD-10-CM

## 2022-06-16 DIAGNOSIS — K219 Gastro-esophageal reflux disease without esophagitis: Secondary | ICD-10-CM | POA: Insufficient documentation

## 2022-06-16 DIAGNOSIS — D123 Benign neoplasm of transverse colon: Secondary | ICD-10-CM | POA: Insufficient documentation

## 2022-06-16 DIAGNOSIS — Z1211 Encounter for screening for malignant neoplasm of colon: Secondary | ICD-10-CM | POA: Diagnosis present

## 2022-06-16 DIAGNOSIS — E039 Hypothyroidism, unspecified: Secondary | ICD-10-CM | POA: Diagnosis not present

## 2022-06-16 HISTORY — PX: POLYPECTOMY: SHX5525

## 2022-06-16 HISTORY — PX: COLONOSCOPY WITH PROPOFOL: SHX5780

## 2022-06-16 SURGERY — COLONOSCOPY WITH PROPOFOL
Anesthesia: Monitor Anesthesia Care

## 2022-06-16 MED ORDER — EPHEDRINE SULFATE-NACL 50-0.9 MG/10ML-% IV SOSY
PREFILLED_SYRINGE | INTRAVENOUS | Status: DC | PRN
  Administered 2022-06-16 (×4): 5 mg via INTRAVENOUS

## 2022-06-16 MED ORDER — DEXAMETHASONE SODIUM PHOSPHATE 10 MG/ML IJ SOLN
INTRAMUSCULAR | Status: DC | PRN
  Administered 2022-06-16: 5 mg via INTRAVENOUS

## 2022-06-16 MED ORDER — LACTATED RINGERS IV SOLN
INTRAVENOUS | Status: AC | PRN
  Administered 2022-06-16: 1000 mL via INTRAVENOUS

## 2022-06-16 MED ORDER — PROPOFOL 500 MG/50ML IV EMUL
INTRAVENOUS | Status: DC | PRN
  Administered 2022-06-16: 150 ug/kg/min via INTRAVENOUS

## 2022-06-16 MED ORDER — ONDANSETRON HCL 4 MG/2ML IJ SOLN
INTRAMUSCULAR | Status: DC | PRN
  Administered 2022-06-16: 4 mg via INTRAVENOUS

## 2022-06-16 MED ORDER — PROPOFOL 10 MG/ML IV BOLUS
INTRAVENOUS | Status: DC | PRN
  Administered 2022-06-16: 70 mg via INTRAVENOUS

## 2022-06-16 MED ORDER — LIDOCAINE 2% (20 MG/ML) 5 ML SYRINGE
INTRAMUSCULAR | Status: DC | PRN
  Administered 2022-06-16: 60 mg via INTRAVENOUS

## 2022-06-16 SURGICAL SUPPLY — 22 items

## 2022-06-16 NOTE — Anesthesia Postprocedure Evaluation (Signed)
Anesthesia Post Note  Patient: Arlyne Brandes  Procedure(s) Performed: COLONOSCOPY WITH PROPOFOL POLYPECTOMY     Patient location during evaluation: PACU Anesthesia Type: MAC Level of consciousness: awake and alert Pain management: pain level controlled Vital Signs Assessment: post-procedure vital signs reviewed and stable Respiratory status: spontaneous breathing, nonlabored ventilation, respiratory function stable and patient connected to nasal cannula oxygen Cardiovascular status: stable and blood pressure returned to baseline Postop Assessment: no apparent nausea or vomiting Anesthetic complications: no   No notable events documented.  Last Vitals:  Vitals:   06/16/22 0924 06/16/22 0927  BP: 133/77 104/74  Pulse: 84 78  Resp: 14 16  Temp:    SpO2: 94% 95%    Last Pain:  Vitals:   06/16/22 0927  TempSrc:   PainSc: 0-No pain                 Gladiola Madore

## 2022-06-16 NOTE — Discharge Instructions (Addendum)
Resume your coumadin (warfarin) at usual dose today.  Contact the prescribing provider for further dosing instructions and timing of checking PT/INR blood test.  ____________________________________________  YOU HAD AN ENDOSCOPIC PROCEDURE TODAY: Refer to the procedure report and other information in the discharge instructions given to you for any specific questions about what was found during the examination. If this information does not answer your questions, please call [place] office at [phone] to clarify.   YOU SHOULD EXPECT: Some feelings of bloating in the abdomen. Passage of more gas than usual. Walking can help get rid of the air that was put into your GI tract during the procedure and reduce the bloating. If you had a lower endoscopy (such as a colonoscopy or flexible sigmoidoscopy) you may notice spotting of blood in your stool or on the toilet paper. Some abdominal soreness may be present for a day or two, also.  DIET: Your first meal following the procedure should be a light meal and then it is ok to progress to your normal diet. A half-sandwich or bowl of soup is an example of a good first meal. Heavy or fried foods are harder to digest and may make you feel nauseous or bloated. Drink plenty of fluids but you should avoid alcoholic beverages for 24 hours. If you had a esophageal dilation, please see attached instructions for diet.    ACTIVITY: Your care partner should take you home directly after the procedure. You should plan to take it easy, moving slowly for the rest of the day. You can resume normal activity the day after the procedure however YOU SHOULD NOT DRIVE, use power tools, machinery or perform tasks that involve climbing or major physical exertion for 24 hours (because of the sedation medicines used during the test).   SYMPTOMS TO REPORT IMMEDIATELY: A gastroenterologist can be reached at any hour. Please call [phone]  for any of the following symptoms:  Following  lower endoscopy (colonoscopy, flexible sigmoidoscopy) Excessive amounts of blood in the stool  Significant tenderness, worsening of abdominal pains  Swelling of the abdomen that is new, acute  Fever of 100 or higher  Following upper endoscopy (EGD, EUS, ERCP, esophageal dilation) Vomiting of blood or coffee ground material  New, significant abdominal pain  New, significant chest pain or pain under the shoulder blades  Painful or persistently difficult swallowing  New shortness of breath  Black, tarry-looking or red, bloody stools  FOLLOW UP:  If any biopsies were taken you will be contacted by phone or by letter within the next 1-3 weeks. Call [phone]  if you have not heard about the biopsies in 3 weeks.  Please also call with any specific questions about appointments or follow up tests.

## 2022-06-16 NOTE — Anesthesia Preprocedure Evaluation (Addendum)
Anesthesia Evaluation  Patient identified by MRN, date of birth, ID band Patient awake    Reviewed: Allergy & Precautions, H&P , NPO status , Patient's Chart, lab work & pertinent test results  History of Anesthesia Complications (+) Family history of anesthesia reaction  Airway Mallampati: II  TM Distance: >3 FB Neck ROM: Full    Dental no notable dental hx. (+) Poor Dentition, Chipped, Missing, Dental Advisory Given,    Pulmonary asthma , COPD, former smoker   Pulmonary exam normal breath sounds clear to auscultation       Cardiovascular + CAD and + Peripheral Vascular Disease  Normal cardiovascular exam(-) dysrhythmias  Rhythm:Regular Rate:Normal     Neuro/Psych  PSYCHIATRIC DISORDERS Anxiety Depression    negative neurological ROS  negative psych ROS   GI/Hepatic negative GI ROS, Neg liver ROS,GERD  ,,  Endo/Other  negative endocrine ROSHypothyroidism    Renal/GU negative Renal ROS  negative genitourinary   Musculoskeletal negative musculoskeletal ROS (+) Arthritis ,    Abdominal   Peds negative pediatric ROS (+)  Hematology negative hematology ROS (+) Blood dyscrasia, anemia   Anesthesia Other Findings Chronic narcotic dependence  Reproductive/Obstetrics negative OB ROS                             Anesthesia Physical Anesthesia Plan  ASA: 3  Anesthesia Plan: MAC   Post-op Pain Management: Minimal or no pain anticipated   Induction: Intravenous  PONV Risk Score and Plan: 2 and Propofol infusion  Airway Management Planned: Mask and Natural Airway  Additional Equipment: None  Intra-op Plan:   Post-operative Plan:   Informed Consent:   Plan Discussed with: Anesthesiologist and CRNA  Anesthesia Plan Comments:        Anesthesia Quick Evaluation

## 2022-06-16 NOTE — Op Note (Signed)
[facility] Patient Name: [name] Procedure Date : 06/16/2022 MRN: [id number] Attending MD: Starr Lake. Myrtie Neither , MD, [id number] Date of Birth: 03-22-1956 CSN: [id number] Age: 66 Admit Type: Outpatient Procedure:                Colonoscopy Indications:              Surveillance: Personal history of adenomatous                            polyps on last colonoscopy > 3 years ago                           TA x 3 (<66mm) Jan 2019 Providers:                Starr Lake. Myrtie Neither, MD, Marge Duncans, RN, Joannie Springs, Technician Referring MD:              Medicines:                Monitored Anesthesia Care Complications:            No immediate complications. Estimated Blood Loss:     Estimated blood loss was minimal. Procedure:                Pre-Anesthesia Assessment:                           - Prior to the procedure, a History and Physical                            was performed, and patient medications and                            allergies were reviewed. The patient's tolerance of                            previous anesthesia was also reviewed. The risks                            and benefits of the procedure and the sedation                            options and risks were discussed with the patient.                            All questions were answered, and informed consent                            was obtained. Prior Anticoagulants: The patient has                            taken Coumadin (warfarin), last dose was 4 days  prior to procedure. ASA Grade Assessment: IV - A                            patient with severe systemic disease that is a                            constant threat to life. After reviewing the risks                            and benefits, the patient was deemed in                            satisfactory condition to undergo the procedure.                           After obtaining  informed consent, the colonoscope                            was passed under direct vision. Throughout the                            procedure, the patient's blood pressure, pulse, and                            oxygen saturations were monitored continuously. The                            CF-HQ190L ([id number]) Olympus colonoscope was                            introduced through the anus and advanced to the the                            cecum, identified by appendiceal orifice and                            ileocecal valve. The colonoscopy was somewhat                            difficult due to a redundant colon, significant                            looping and the patient's body habitus. Successful                            completion of the procedure was aided by                            straightening and shortening the scope to obtain                            bowel loop reduction. The patient tolerated the  procedure fairly well. The quality of the bowel                            preparation was good after lavage except the rectum                            was fair in some areas. (OIC) The ileocecal valve,                            appendiceal orifice, and rectum were photographed.                            The bowel preparation used was GoLYTELY. Scope In: 8:27:48 AM Scope Out: 8:46:27 AM Scope Withdrawal Time: 0 hours 14 minutes 2 seconds  Total Procedure Duration: 0 hours 18 minutes 39 seconds  Findings:      The perianal and digital rectal examinations were normal.      Repeat examination of right colon under NBI performed.      A diminutive polyp was found in the hepatic flexure. The polyp was       sessile. The polyp was removed with a cold snare. Resection and       retrieval were complete.      Multiple diverticula were found from transverse colon to sigmoid colon.      The exam was otherwise without abnormality on direct and  retroflexion       views. Impression:               - One diminutive polyp at the hepatic flexure,                            removed with a cold snare. Resected and retrieved.                           - Diverticulosis from transverse colon to sigmoid                            colon.                           - The examination was otherwise normal on direct                            and retroflexion views. Recommendation:           - Patient has a contact number available for                            emergencies. The signs and symptoms of potential                            delayed complications were discussed with the                            patient. Return to normal activities tomorrow.  Written discharge instructions were provided to the                            patient.                           - Resume previous diet.                           - Resume Coumadin (warfarin) at prior dose today.                           - Await pathology results.                           - Repeat colonoscopy is recommended for                            surveillance. The colonoscopy date will be                            determined after pathology results from today's                            exam become available for review. Procedure Code(s):        --- Professional ---                           [phone], Colonoscopy, flexible; with removal of                            tumor(s), polyp(s), or other lesion(s) by snare                            technique Diagnosis Code(s):        --- Professional ---                           Z86.010, Personal history of colonic polyps                           D12.3, Benign neoplasm of transverse colon (hepatic                            flexure or splenic flexure)                           K57.30, Diverticulosis of large intestine without                            perforation or abscess without bleeding CPT copyright 2022  American Medical Association. All rights reserved. The codes documented in this report are preliminary and upon coder review may  be revised to meet current compliance requirements. Lora Chavers L. Myrtie Neither, MD 06/16/2022 8:52:58 AM This report has been signed electronically. Number of Addenda: 0

## 2022-06-16 NOTE — H&P (Signed)
History and Physical:  This patient presents for endoscopic testing for: History of colon polyps  66 year old woman here today for surveillance colonoscopy.  More clinical details in my office note of 03/11/2022. Last colonoscopy January 2019 with 3 subcentimeter tubular adenomas removed. This patient also struggles with opioid-induced constipation, and is on chronic Coumadin therapy for history of DVT.  (Coumadin has been held 4 days prior to today's procedure) Patient denies any clinical changes since last seen by me on the date noted above.Marland Kitchen No ED visits or hospitalizations in our health system since the last visit with me.  Patient is otherwise without complaints or active issues today.   Past Medical History: Past Medical History:  Diagnosis Date   Anemia, unspecified    Anxiety    Arthritis    "about q joint i've got" (07/14/2016)   Benign neoplasm of colon 12/2007   Hyperplastic colon polyps   CAD (coronary artery disease)    minimal catheterization, October 2008   Cellulitis    Chest pain    with stress   Childhood asthma    Chronic low back pain    Constipation    and diarrhea chronic..Dr. 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   Methadone adverse reaction    for chronic leg and back pain   Other pulmonary embolism and infarction 2008   Significant 2008 and coumadin therapy RV dysfunction...echo...2008..EF 50%..right ventricle markedly dilated w marked  right ventricular dysfunc and moder tricuspid regurg/echo..March 2010, Ef 50%, mild dilation of right ventricule w mild decrease right ventric function    Peripheral vascular disease ([facility])    Rectus sheath hematoma 07/13/2012   On coumadin    Right bundle branch block    intermittent   Thyroid disease    Tobacco use disorder    Urinary incontinence    Venous insufficiency (chronic) (peripheral)    Patient's legs were wrapped 2013     Past Surgical History: Past Surgical History:  Procedure Laterality Date   CARDIAC CATHETERIZATION  05/2007   Archie Endo 12/08/2010   CHOLECYSTECTOMY OPEN     COLONOSCOPY WITH PROPOFOL N/A 08/11/2017   Procedure: COLONOSCOPY WITH PROPOFOL;  Surgeon: Doran Stabler, MD;  Location: [facility];  Service: Gastroenterology;  Laterality: N/A;   GASTRIC BYPASS  1980s   JOINT REPLACEMENT     KNEE ARTHROSCOPY Left 05/2001   Ar[facility] Endo 12/21/2010   SHOULDER ARTHROSCOPY W/ ROTATOR CUFF REPAIR Right 08/2002   /notes 12/21/2010   TONSILLECTOMY     TOTAL HIP ARTHROPLASTY Right 01/2008    Allergies: Allergies  Allergen Reactions   Amitiza [Lubiprostone] Diarrhea and Nausea And Vomiting   Morphine Anaphylaxis    Nausea, rash, itching, vomiting   Effexor [Venlafaxine] Other (See Comments)    Crying Spells    Nsaids     Bowel irregularity (coffee grounds)    Outpatient Meds: Current Facility-Administered  Medications  Medication Dose Route Frequency Provider Last Rate Last Admin   lactated ringers infusion    Continuous PRN [name], MD   1,000 mL at 06/16/22 0748      ___________________________________________________________________ Objective   Exam:  BP 132/64   Pulse 64   Temp (!) 96 F (35.6 C) (Tympanic)   Resp 13   Ht '5\' 5"'$  (1.651 m)   Wt (!) 143.3 kg   SpO2 98%   BMI 52.59 kg/m   CV: regular , S1/S2 Resp: clear to auscultation bilaterally, normal RR and effort noted GI: soft, no tenderness, with active bowel  sounds.   Assessment: History of colon polyps Long-term use oral anticoagulation Morbid obesity (this procedure is being done in the hospital outpatient endoscopy department)   Plan: Colonoscopy  The benefits and risks of the planned procedure were described in detail with the patient or (when appropriate) their health care proxy.  Risks were outlined as including, but not limited to, bleeding, infection, perforation, adverse medication reaction leading to cardiac or pulmonary decompensation, pancreatitis (if ERCP).  The limitation of incomplete mucosal visualization was also discussed.  No guarantees or warranties were given.    The patient is appropriate for an endoscopic procedure in the ambulatory setting.   - Wilfrid Lund, MD

## 2022-06-16 NOTE — Interval H&P Note (Signed)
History and Physical Interval Note:  06/16/2022 8:15 AM  [name]  has presented today for surgery, with the diagnosis of hx of colon polyps.  The various methods of treatment have been discussed with the patient and family. After consideration of risks, benefits and other options for treatment, the patient has consented to  Procedure(s): COLONOSCOPY WITH PROPOFOL (N/A) as a surgical intervention.  The patient's history has been reviewed, patient examined, no change in status, stable for surgery.  I have reviewed the patient's chart and labs.  Questions were answered to the patient's satisfaction.     [name]

## 2022-06-16 NOTE — Transfer of Care (Signed)
Immediate Anesthesia Transfer of Care Note  Patient: [name]  Procedure(s) Performed: COLONOSCOPY WITH PROPOFOL POLYPECTOMY  Patient Location: PACU  Anesthesia Type:MAC  Level of Consciousness: awake and patient cooperative  Airway & Oxygen Therapy: Patient Spontanous Breathing and Patient connected to face mask oxygen  Post-op Assessment: Report given to RN, Post -op Vital signs reviewed and stable, and Patient moving all extremities X 4  Post vital signs: Reviewed and stable  Last Vitals:  Vitals Value Taken Time  BP 114/67 06/16/22 0855  Temp    Pulse 83 06/16/22 0859  Resp 16 06/16/22 0859  SpO2 98 % 06/16/22 0859  Vitals shown include unvalidated device data.  Last Pain:  Vitals:   06/16/22 0855  TempSrc:   PainSc: 0-No pain         Complications: No notable events documented.

## 2022-06-17 ENCOUNTER — Encounter: Payer: Self-pay | Admitting: Internal Medicine

## 2022-06-17 LAB — SURGICAL PATHOLOGY

## 2022-06-19 ENCOUNTER — Encounter (HOSPITAL_COMMUNITY): Payer: Self-pay | Admitting: Gastroenterology

## 2022-06-20 ENCOUNTER — Encounter: Payer: Self-pay | Admitting: Gastroenterology

## 2022-06-20 ENCOUNTER — Ambulatory Visit (INDEPENDENT_AMBULATORY_CARE_PROVIDER_SITE_OTHER): Payer: Medicare HMO

## 2022-06-20 DIAGNOSIS — Z7901 Long term (current) use of anticoagulants: Secondary | ICD-10-CM

## 2022-06-20 LAB — POCT INR: INR: 1.4 — AB (ref 2.0–3.0)

## 2022-06-20 NOTE — Patient Instructions (Addendum)
Increase dose today to 1 1/2 tablets (7.5 mg total) and increase dose tomorrow to 1 1/2 tablets (7.5 mg total). Then continue to take one 5 mg tablet daily except take 1 1 /2 tablets on Thursdays and Saturdays. Recheck in one week.

## 2022-06-20 NOTE — Progress Notes (Signed)
Patient checked INR with home [employer] analyzer and submitted results via portal today. INR is 1.4. Pt coming off of a hold for a colonoscopy on 11/9.     Increase dose today to 1 1/2 tablets (7.5 mg total) and increase dose tomorrow to 1 1/2 tablets (7.5 mg total). Then continue to take one 5 mg tablet daily except take 1 1 /2 tablets on Thursdays and Saturdays. Recheck in one week.

## 2022-06-21 DIAGNOSIS — R6889 Other general symptoms and signs: Secondary | ICD-10-CM | POA: Diagnosis not present

## 2022-06-22 ENCOUNTER — Ambulatory Visit (INDEPENDENT_AMBULATORY_CARE_PROVIDER_SITE_OTHER): Payer: Medicare HMO

## 2022-06-22 ENCOUNTER — Encounter: Payer: Self-pay | Admitting: Gastroenterology

## 2022-06-22 NOTE — Progress Notes (Signed)
INR not checked today, encounter created for documentation purposes.  Pt called to report missing yesterday's dose of warfarin. Pt reports receiving a steroid injection in her shoulder yesterday along with an IM injection of tramadol.   Instructed pt in take two 5 mg tablets today then continue to take one 5 mg tablet daily except take 1 1 /2 tablets on Thursdays and Saturdays, recheck on 11/21.

## 2022-06-22 NOTE — Patient Instructions (Signed)
Take two 5 mg tablets today then continue to take one 5 mg tablet daily except take 1 1 /2 tablets on Thursdays and Saturdays, recheck on 11/21.

## 2022-06-23 NOTE — Telephone Encounter (Signed)
Movantik samples for her when we have some.  She is welcome to schedule a follow up visit, but it is most likely easier for her to check in periodically by phone or portal message.  - HD

## 2022-06-28 ENCOUNTER — Encounter: Payer: Self-pay | Admitting: Internal Medicine

## 2022-06-28 ENCOUNTER — Ambulatory Visit (INDEPENDENT_AMBULATORY_CARE_PROVIDER_SITE_OTHER): Payer: Medicare HMO

## 2022-06-28 DIAGNOSIS — Z7901 Long term (current) use of anticoagulants: Secondary | ICD-10-CM | POA: Diagnosis not present

## 2022-06-28 LAB — POCT INR: INR: 3 (ref 2.0–3.0)

## 2022-06-28 NOTE — Progress Notes (Signed)
Continue one 5 mg tablet daily except take 1 1/2 tablets on Thursdays and Saturdays, recheck in 2 weeks (07/12/22).

## 2022-06-28 NOTE — Patient Instructions (Signed)
Continue one 5 mg tablet daily except take 1 1/2 tablets on Thursdays and Saturdays, recheck in 2 weeks (07/12/22).

## 2022-06-29 ENCOUNTER — Encounter: Payer: Self-pay | Admitting: Internal Medicine

## 2022-07-03 ENCOUNTER — Encounter: Payer: Self-pay | Admitting: Internal Medicine

## 2022-07-04 ENCOUNTER — Encounter: Payer: Self-pay | Admitting: Internal Medicine

## 2022-07-08 ENCOUNTER — Encounter: Payer: Self-pay | Admitting: Obstetrics and Gynecology

## 2022-07-08 ENCOUNTER — Other Ambulatory Visit (HOSPITAL_COMMUNITY)
Admission: RE | Admit: 2022-07-08 | Discharge: 2022-07-08 | Disposition: A | Discharge status: Home / Self Care | Payer: Medicare HMO | Attending: Obstetrics and Gynecology | Admitting: Obstetrics and Gynecology

## 2022-07-08 ENCOUNTER — Ambulatory Visit: Payer: Medicare HMO | Admitting: Obstetrics and Gynecology

## 2022-07-08 VITALS — BP 114/67 | HR 78

## 2022-07-08 DIAGNOSIS — N390 Urinary tract infection, site not specified: Secondary | ICD-10-CM

## 2022-07-08 DIAGNOSIS — R82998 Other abnormal findings in urine: Secondary | ICD-10-CM | POA: Diagnosis not present

## 2022-07-08 DIAGNOSIS — R6889 Other general symptoms and signs: Secondary | ICD-10-CM | POA: Diagnosis not present

## 2022-07-08 DIAGNOSIS — R350 Frequency of micturition: Secondary | ICD-10-CM | POA: Diagnosis not present

## 2022-07-08 LAB — POCT URINALYSIS DIPSTICK
Bilirubin, UA: NEGATIVE
Glucose, UA: NEGATIVE
Ketones, UA: NEGATIVE
Nitrite, UA: POSITIVE
Protein, UA: NEGATIVE
Spec Grav, UA: 1.02 (ref 1.010–1.025)
Urobilinogen, UA: 0.2 E.U./dL
pH, UA: 6 (ref 5.0–8.0)

## 2022-07-08 MED ORDER — SULFAMETHOXAZOLE-TRIMETHOPRIM 400-80 MG PO TABS: 1.0000 | ORAL_TABLET | Freq: Every day | ORAL | 5 refills | Status: DC

## 2022-07-08 MED ORDER — CEPHALEXIN 500 MG PO CAPS: 500.0000 mg | ORAL_CAPSULE | Freq: Two times a day (BID) | ORAL | 0 refills | Status: DC

## 2022-07-08 MED ORDER — ESTRADIOL 0.1 MG/GM VA CREA: TOPICAL_CREAM | VAGINAL | 11 refills | Status: DC

## 2022-07-08 NOTE — Patient Instructions (Signed)
Start keflex '500mg'$  twice a day for a week. Then start bactrim once a day for six months.   Start vaginal estrogen therapy nightly for two weeks then 2 times weekly at night for prevention of infections. Place a pea sized amount on your fingertip and place in the vagina.

## 2022-07-08 NOTE — Progress Notes (Signed)
[facility] Return Visit  SUBJECTIVE  History of Present Illness: Yamilee Harmes is a 66 y.o. female seen in follow-up for recurrent UTI.   Has a lot of pain on lower abdomen and feels like she is urinating hot water, has  burning. She had about 3 weeks of no symptoms since her last antibiotic course.  She was started on augmentin for a week. She was taking it as prescribed.   Has had several recent UTIs:  05/18/22- >100,000 Klebsiella pneumoniae- resistant to ampicillin and nitrofurantoin.  04/01/22- >100,000 Klebsiella variicola- resistant to ampicillin and nitrofurantoin.  01/21/22- >100,000 Klebsiella pneumoniae- resistant to ampicillin and nitrofurantoin.   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   Past Surgical History: She  has a past surgical history that includes Gastric bypass (1980s); Tonsillectomy; Cholecystectomy open; Joint replacement; Total hip arthroplasty (Right, 01/2008); Shoulder arthroscopy w/ rotator cuff repair (Right, 08/2002); Knee arthroscopy (Left, 05/2001); Cardiac catheterization (05/2007); Colonoscopy with propofol (N/A, 08/11/2017);  Colonoscopy with propofol (N/A, 06/16/2022); and polypectomy (06/16/2022).   Medications: She has a current medication list which includes the following prescription(s): bisacodyl, cephalexin, estradiol, dry eye relief drops, hydromorphone, levothyroxine, levothyroxine, methadone, naloxone, nystatin-triamcinolone ointment, rabeprazole, interdry 10"x36", sulfamethoxazole-trimethoprim, cyanocobalamin, and warfarin.   Allergies: Patient is allergic to amitiza [lubiprostone], morphine, effexor [venlafaxine], and nsaids.   Social History: Patient  reports that she quit smoking about 11 years ago. Her smoking use included cigarettes. She has a 2.70 pack-year smoking history. She has never used smokeless tobacco. She reports that she does not drink alcohol and does not use drugs.      OBJECTIVE     Physical Exam: Vitals:   07/08/22 0908  BP: 114/67  Pulse: 78   Gen: No apparent distress, A&O x 3.  Detailed Urogynecologic Evaluation:  Deferred.    ASSESSMENT AND PLAN    Ms. Shoaf is a 66 y.o. with:  1. Urinary frequency   2. Leukocytes in urine   3. Recurrent UTI    - Currently POC urine suspicious for UTI and patient is symptomatic. Based on last cultures, will treat with Keflex '500mg'$  BID x 7 days.  - Then for recurrent UTI, will start prophylaxis with bactrim DS- 1 tab daily x6 months.  - Recommended starting vaginal estrogen cream for UTI prevention. Prescribed estrace 0.5g nightly for two weeks then twice a week after.  - She will notify us if UTI symptoms return.   Return 6 months or sooner if needed.    Jaquita Folds, MD

## 2022-07-10 LAB — URINE CULTURE: Culture: >=100000 — AB

## 2022-07-11 ENCOUNTER — Encounter: Payer: Self-pay | Admitting: Internal Medicine

## 2022-07-11 ENCOUNTER — Encounter: Payer: Self-pay | Admitting: Obstetrics and Gynecology

## 2022-07-12 ENCOUNTER — Ambulatory Visit (INDEPENDENT_AMBULATORY_CARE_PROVIDER_SITE_OTHER): Payer: Medicare HMO

## 2022-07-12 DIAGNOSIS — Z7901 Long term (current) use of anticoagulants: Secondary | ICD-10-CM

## 2022-07-12 LAB — POCT INR: INR: 3.4 — AB (ref 2.0–3.0)

## 2022-07-12 NOTE — Progress Notes (Unsigned)
Pt diagnosed with UTI and has been started on cephalexin (Keflex) x 7 days and then will begin Bactrim, 400/'80mg'$ , daily for 6 months. Pt has also been prescribed estradiol cream.  Hold dose today and then change weekly dose to take 1 tablet daily. Recheck in 1 week, 12/12. Contacted pt and advised. Pt verbalized understanding.  May need to adjust dosing lower when pt starts Bactrim.

## 2022-07-12 NOTE — Patient Instructions (Addendum)
Pre visit review using our clinic review tool, if applicable. No additional management support is needed unless otherwise documented below in the visit note.  Hold dose today and then change weekly dose to take 1 tablet daily. Recheck in 1 week, 12/12.

## 2022-07-16 ENCOUNTER — Other Ambulatory Visit: Payer: Self-pay | Admitting: Internal Medicine

## 2022-07-16 DIAGNOSIS — Z7901 Long term (current) use of anticoagulants: Secondary | ICD-10-CM

## 2022-07-18 NOTE — Telephone Encounter (Signed)
Pt compliant with warfarin management. Dosing changed with last INR check. Updated script.

## 2022-07-19 ENCOUNTER — Ambulatory Visit (INDEPENDENT_AMBULATORY_CARE_PROVIDER_SITE_OTHER): Payer: Medicare HMO

## 2022-07-19 DIAGNOSIS — Z7901 Long term (current) use of anticoagulants: Secondary | ICD-10-CM | POA: Diagnosis not present

## 2022-07-19 LAB — POCT INR: INR: 2 (ref 2.0–3.0)

## 2022-07-19 NOTE — Patient Instructions (Addendum)
Pre visit review using our clinic review tool, if applicable. No additional management support is needed unless otherwise documented below in the visit note.  Continue 1 tablet daily. Recheck on 12/15.

## 2022-07-19 NOTE — Progress Notes (Addendum)
Pt diagnosed with UTI and has finished cephalexin (Keflex) x 7 days and is beginning Bactrim today, 400/'80mg'$ , daily for 6 months. Pt has also been prescribed estradiol cream. Continue 1 tablet daily. Recheck on 12/15.  Contacted pt and advised. Pt verbalized understanding.  May need to adjust dosing lower in the future due to interaction with Bactrim.

## 2022-07-20 DIAGNOSIS — R6889 Other general symptoms and signs: Secondary | ICD-10-CM | POA: Diagnosis not present

## 2022-07-21 ENCOUNTER — Telehealth: Payer: Self-pay | Admitting: Internal Medicine

## 2022-07-21 ENCOUNTER — Encounter: Payer: Self-pay | Admitting: Internal Medicine

## 2022-07-21 NOTE — Telephone Encounter (Signed)
For our records:   We have received a medical clearance form from [facility] for the pt and it has been placed in Dr. Quay Burow boxes.   Upon completion please fax to:  [phone]

## 2022-07-21 NOTE — Telephone Encounter (Signed)
Form received and placed in Dr.Burns folder

## 2022-07-25 ENCOUNTER — Ambulatory Visit (INDEPENDENT_AMBULATORY_CARE_PROVIDER_SITE_OTHER): Payer: Medicare HMO

## 2022-07-25 DIAGNOSIS — Z7901 Long term (current) use of anticoagulants: Secondary | ICD-10-CM | POA: Diagnosis not present

## 2022-07-25 LAB — POCT INR: INR: 3 (ref 2.0–3.0)

## 2022-07-25 NOTE — Progress Notes (Signed)
Pt currently on Bactrim, 400/'80mg'$ , daily for 6 months. Pt has also been prescribed estradiol cream. Pt is also having deep teeth cleaning and needed clearance from PCP. Clearance was given by PCP.  Continue 1 tablet daily. Recheck on 12/21.  Contacted pt and advised. Pt verbalized understanding.  May need to adjust dosing lower in the future due to interaction with Bactrim.

## 2022-07-25 NOTE — Patient Instructions (Addendum)
Pre visit review using our clinic review tool, if applicable. No additional management support is needed unless otherwise documented below in the visit note.  Continue 1 tablet daily. Recheck on 12/21.

## 2022-07-28 ENCOUNTER — Ambulatory Visit (INDEPENDENT_AMBULATORY_CARE_PROVIDER_SITE_OTHER): Payer: Medicare HMO

## 2022-07-28 DIAGNOSIS — Z7901 Long term (current) use of anticoagulants: Secondary | ICD-10-CM | POA: Diagnosis not present

## 2022-07-28 DIAGNOSIS — Z86711 Personal history of pulmonary embolism: Secondary | ICD-10-CM

## 2022-07-28 LAB — POCT INR: INR: 2.9 (ref 2.0–3.0)

## 2022-07-28 NOTE — Patient Instructions (Addendum)
Pre visit review using our clinic review tool, if applicable. No additional management support is needed unless otherwise documented below in the visit note.  Continue 1 tablet daily. Recheck on 12/2

## 2022-07-28 NOTE — Progress Notes (Signed)
Pt currently on Bactrim, 400/'80mg'$ , daily for 6 months. Pt has also been prescribed estradiol cream. Pt is also having deep teeth cleaning and needed clearance from PCP. Clearance was given by PCP. Continue 1 tablet daily. Recheck on 12/21.  Contacted pt and advised. Pt verbalized understanding.  May need to adjust dosing lower in the future due to interaction with Bactrim.

## 2022-07-30 ENCOUNTER — Encounter: Payer: Self-pay | Admitting: Internal Medicine

## 2022-07-31 ENCOUNTER — Encounter: Payer: Self-pay | Admitting: Internal Medicine

## 2022-08-01 ENCOUNTER — Telehealth: Payer: Self-pay | Admitting: Obstetrics and Gynecology

## 2022-08-01 DIAGNOSIS — N390 Urinary tract infection, site not specified: Secondary | ICD-10-CM

## 2022-08-01 NOTE — Telephone Encounter (Signed)
TC from patient on 07/30/22 @ 14:28:  Patient reports waking up12 hours earlier with generalized pruritic rash sparing her face only. Is followed by dr Wannetta Sender for chronic UTI. Recently completed a course of Keflex and is now on daily Bactrim DS for 30 days (started 1 week ago).  Denies swelling or difficulty breathing but is very affected by pruritus. No other new medication in use. Has not had previous allergic reaction to Bactrim.  Patient instructed to d/c Bactrim and start Benadryl. May also use Aveeno lotion/bath soak. Patient to call 911 if swelling, throat tightening or respiratory Sx.  Patient will contact Dr Tommas Olp office for follow-up

## 2022-08-03 ENCOUNTER — Other Ambulatory Visit: Payer: Self-pay | Admitting: Obstetrics and Gynecology

## 2022-08-03 DIAGNOSIS — N390 Urinary tract infection, site not specified: Secondary | ICD-10-CM

## 2022-08-03 DIAGNOSIS — R82998 Other abnormal findings in urine: Secondary | ICD-10-CM

## 2022-08-03 MED ORDER — CEPHALEXIN 250 MG PO CAPS: 250.0000 mg | ORAL_CAPSULE | Freq: Every day | ORAL | 5 refills | Status: DC

## 2022-08-03 NOTE — Telephone Encounter (Addendum)
Please call patient and see how she is doing, if the rash is still present. It looks like she also reached out to her PCP. She should not take the bactrim anymore in case that is the cause of her rash. Instead I will prescribe keflex to take once a day since she was able to tolerate that well.   Jaquita Folds, MD

## 2022-08-03 NOTE — Addendum Note (Signed)
Addended by: Jaquita Folds on: 08/03/2022 10:29 AM   Modules accepted: Orders

## 2022-08-04 ENCOUNTER — Ambulatory Visit (INDEPENDENT_AMBULATORY_CARE_PROVIDER_SITE_OTHER): Payer: Medicare HMO

## 2022-08-04 ENCOUNTER — Telehealth: Payer: Self-pay | Admitting: Internal Medicine

## 2022-08-04 DIAGNOSIS — Z7901 Long term (current) use of anticoagulants: Secondary | ICD-10-CM

## 2022-08-04 LAB — POCT INR: INR: 3.1 — AB (ref 2.0–3.0)

## 2022-08-04 NOTE — Telephone Encounter (Signed)
Pt was contacted and said she realized she has an allergic reaction to popcorn with Parmesan cheese. Pt said she took benadryl and her rash was gone the next day. Pt said she did not have a reaction to bactrim.

## 2022-08-04 NOTE — Progress Notes (Signed)
Pt currently on Bactrim, 400/'80mg'$ , daily for 6 months. Pt has also been prescribed estradiol cream. Checks INR at home, received result of 3.1 from [employer] portal today.  Decrease dose today to 1/2 tablet (2.5 mg total).  Then continue 1 tablet daily. Recheck in one week.  Contacted pt and advised. Pt verbalized understanding.

## 2022-08-04 NOTE — Patient Instructions (Signed)
Decrease dose today to 1/2 tablet (2.5 mg total).  Then continue 1 tablet daily. Recheck in one week.  Contacted pt and advised. Pt verbalized understanding.

## 2022-08-04 NOTE — Telephone Encounter (Signed)
Clearance received and placed in Dr. Quay Burow folder to sign and complete

## 2022-08-04 NOTE — Telephone Encounter (Signed)
For our records:  We have received Pre-Op PW from [place] for the pt and it has been placed in Dr. Eilleen Kempf boxes.   Upon completion please fax to: [phone]

## 2022-08-11 ENCOUNTER — Telehealth: Payer: Self-pay | Admitting: Internal Medicine

## 2022-08-11 ENCOUNTER — Ambulatory Visit (INDEPENDENT_AMBULATORY_CARE_PROVIDER_SITE_OTHER): Payer: Medicare HMO

## 2022-08-11 DIAGNOSIS — Z86711 Personal history of pulmonary embolism: Secondary | ICD-10-CM

## 2022-08-11 DIAGNOSIS — Z7901 Long term (current) use of anticoagulants: Secondary | ICD-10-CM

## 2022-08-11 DIAGNOSIS — Z86718 Personal history of other venous thrombosis and embolism: Secondary | ICD-10-CM | POA: Diagnosis not present

## 2022-08-11 LAB — POCT INR: INR: 2.2 (ref 2.0–3.0)

## 2022-08-11 NOTE — Patient Instructions (Addendum)
Pre visit review using our clinic review tool, if applicable. No additional management support is needed unless otherwise documented below in the visit note.  Continue 1 tablet daily. Recheck in two week.

## 2022-08-11 NOTE — Telephone Encounter (Signed)
Otilio Connors called from [facility] needing clarification on the surgical clearance form that was sent in by Dr.Burns. She stated she wanted more clarification on when the patient should stop Coumadin. Ann's best callback number is [phone].

## 2022-08-11 NOTE — Progress Notes (Signed)
Pt currently on Bactrim, 400/'80mg'$ , daily for 6 months (until 01/18/23).  Pt has also been prescribed estradiol cream. Checks INR at home, received result of 2.2 from [employer] portal today.  Pt is having a dental procedure but is not sure when the procedure is because the dentist has not contacted her. She is also not sure what she is to do with her warfarin. Advised the instructions for her warfarin are not in the chart but a msg will be sent to the PCP and her CMA to inquire what the PCP wants the pt to do with her warfarin. Advised this nurse will return call with further information.   Continue 1 tablet daily. Recheck in two week.  Contacted pt and advised. Pt verbalized understanding.

## 2022-08-11 NOTE — Telephone Encounter (Signed)
Clarification given today.

## 2022-08-11 NOTE — Telephone Encounter (Signed)
Requested information concerning warfarin management around dental procedure.  Marcina Millard, CMA  You9 minutes ago (2:53 PM)   Hi Larene Beach,  She is having some teeth extracted and when speaking with the dental office and according to her clearance she is to hold her Coumadin 5 days prior to extractions and resume ASAP. No date is listed on when she is to have the extractions done but she is seeing Dr. Tasia Catchings  with [facility]. I have copy of her faxed clearance or if you need anything else let me know.    Pt was placed on a lovenox bridge for her last procedure which was a 3 day hold.   Contacted Dr. Otilio Connors, dentist at [facility] who report she has not requested a hold but has requested the PCP advise on her anticoagulant. She reports the medical clearance form requested instructions for warfarin for any tooth extractions or deep cleaning. She also reported pt was referred to another dentist that accepts her insurance for the extraction but she is still a pt of Dr. Tasia Catchings. She report the extraction will most likely involve some cutting and deep root removal. She also reported the pt can contact that dentist to schedule the extraction whenever she is ready. Advised this nurse will f/u with pt with those instructions.  Contacted pt and advised she may need a lovenox bridge. She reports she had a colonoscopy in Oct and had a 5 day hold and was not placed on a lovenox bridge. Advised when she had a colposcopy in Feb she was on a 3 day hold and she was placed on a lovenox bridge. Advised pt a msg would be sent to PCP to inquire if she thinks pt should be placed on a lovenox bridge for the dental procedure and 5 day hold. Advised if anything changes to contact coumadin clinic. Pt verbalized understanding.

## 2022-08-12 NOTE — Telephone Encounter (Signed)
Binnie Rail, MD  You19 hours ago (3:58 PM)   We can if the dental office requests holding it - technically it can be continued but I am not sure how in depth of an extraction it is so due the lovenox     Advised pt of recommendation for lovenox bridge. She agreed but does not have a date yet for the extraction. She will contact the dentist office on Monday because they are closed today.

## 2022-08-12 NOTE — Progress Notes (Signed)
See other phone note.  Binnie Rail, MD

## 2022-08-25 ENCOUNTER — Ambulatory Visit (INDEPENDENT_AMBULATORY_CARE_PROVIDER_SITE_OTHER): Payer: Medicare HMO

## 2022-08-25 DIAGNOSIS — Z7901 Long term (current) use of anticoagulants: Secondary | ICD-10-CM

## 2022-08-25 LAB — POCT INR: INR: 2.7 (ref 2.0–3.0)

## 2022-08-25 NOTE — Patient Instructions (Signed)
Pre visit review using our clinic review tool, if applicable. No additional management support is needed unless otherwise documented below in the visit note. 

## 2022-08-25 NOTE — Progress Notes (Addendum)
Checks INR at home, received result of 2.7 from [employer] portal today.  Pt is having a dental procedure but it is not scheduled yet. She will need a lovenox bridge. Pt reports she has an apt with the dentist performing the procedure on 1/23 for a sedation evaluation. She will call anticoagulation clinic with update.   Continue 1 tablet daily. Recheck in two week.  Pt reports she has an apt with the dentist performing the procedure on 1/23 for a sedation evaluation. She will call anticoagulation clinic with update.

## 2022-08-30 DIAGNOSIS — R6889 Other general symptoms and signs: Secondary | ICD-10-CM | POA: Diagnosis not present

## 2022-08-30 NOTE — Telephone Encounter (Signed)
Pt reports she saw the dentist that will perform the tooth extraction today and it is scheduled for 1/29 and she wanted to let the coumadin clinic know. She reports Dr. Lisa Roca at [facility] on [address] will perform the extraction. Number is [phone].  Office is currently closed. Will f/u with office tomorrow for details on how in depth the single tooth extraction is to discuss with PCP if warfarin hold is necessary.

## 2022-08-31 ENCOUNTER — Encounter: Payer: Self-pay | Admitting: Gastroenterology

## 2022-08-31 NOTE — Telephone Encounter (Signed)
Advised pt to take last dose of warfarin before extraction on 09/02/22. Hold warfarin on 1/27 and 1/28. The day of the extraction pt was advised to follow instructions from dentist if she should restart warfarin that night or wait until the next day based on bleeding risk after th procedure. Advised to recheck INR on 2/5, one week after procedure. Advised if any concerns contact the coumadin clinic. Pt verbalized understanding.

## 2022-08-31 NOTE — Telephone Encounter (Signed)
For a single uncomplicated extraction can hold warfarin 0-3 days.  Last INR 2.7 - lets hold for 2 days then restart.  No lovenox is needed.

## 2022-08-31 NOTE — Telephone Encounter (Signed)
Pt reports she is having a single tooth extraction on 1/29. Advised if she is only having one tooth extracted she may not need to hold warfarin. Advised this nurse would contact the dentist's office, Dr. Rip Harbour at [facility], who is performing the extraction. Contacted dentist's office and the assistant reported pt is having 2 teeth extracted. Advised pt said she is only having one extracted.  Contacted pt and she reports they want to extract two but she cannot do that financially now and she told them one tooth. She contacted dentist and told them it would only be one tooth. They said they will be faxing a new medical clearance form to PCP for instructions on warfarin because the other clearance form was from the referring dentist.  Advised pt she may not need to hold warfarin if she is only having one tooth extracted. Advised this nurse would f/u with PCP for advisement and then call her back. Pt verbalized understanding and was appreciative.

## 2022-09-03 ENCOUNTER — Encounter: Payer: Self-pay | Admitting: Internal Medicine

## 2022-09-05 ENCOUNTER — Other Ambulatory Visit: Payer: Self-pay | Admitting: Internal Medicine

## 2022-09-05 ENCOUNTER — Other Ambulatory Visit: Payer: Self-pay

## 2022-09-05 DIAGNOSIS — R6889 Other general symptoms and signs: Secondary | ICD-10-CM | POA: Diagnosis not present

## 2022-09-05 NOTE — Telephone Encounter (Signed)
Pt reports she went to have tooth extraction and staff reported she was there to have all teeth extracted on top jaw. Pt reported to them she did not agree to that and is only supposed to have one tooth extraction. They reported there must have been a mix up. By this point pt was upset and tried to leave but office manager talked to her and said they could RS her for next Monday, 2/5. Pt did schedule another apt for 2/5.  Pt has held warfarin the last two days for procedure today. Advised pt to increase dose today and tomorrow to take 1 1/2 tablets and then restart normal dosing of 1 tablet daily. Pt reports she is not sure if she will have the procedure on 2/5 because she is upset with them but will f/u with the coumadin clinic by the end of the week for an update on whether she will have the tooth extraction or not. Advised if any further changes to contact the coumadin clinic. Pt verbalized understanding.

## 2022-09-07 NOTE — Telephone Encounter (Signed)
Pt LVM reporting she cancelled her apt with the dentist for 2/5 because she no longer trusts them with her care since they have messed up her case so many times. Pt will continue normal dosing after today and then recheck INR on 2/7.  Sent mychart msg to inform of next test date.

## 2022-09-13 ENCOUNTER — Ambulatory Visit (INDEPENDENT_AMBULATORY_CARE_PROVIDER_SITE_OTHER): Payer: Medicare HMO

## 2022-09-13 DIAGNOSIS — Z7901 Long term (current) use of anticoagulants: Secondary | ICD-10-CM | POA: Diagnosis not present

## 2022-09-13 LAB — POCT INR: INR: 2.1 (ref 2.0–3.0)

## 2022-09-13 NOTE — Progress Notes (Addendum)
Checks INR at home, received result of 2.1 from [employer] portal today.  Continue 1 tablet daily. Recheck in two week. LVM with instructions.

## 2022-09-13 NOTE — Patient Instructions (Addendum)
Pre visit review using our clinic review tool, if applicable. No additional management support is needed unless otherwise documented below in the visit note.  Continue 1 tablet daily. Recheck in two week.

## 2022-09-23 ENCOUNTER — Encounter: Payer: Self-pay | Admitting: Gastroenterology

## 2022-09-23 ENCOUNTER — Ambulatory Visit
Admission: RE | Admit: 2022-09-23 | Discharge: 2022-09-23 | Disposition: A | Discharge status: Home / Self Care | Payer: Medicare HMO | Source: Ambulatory Visit | Attending: Acute Care | Admitting: Acute Care

## 2022-09-23 DIAGNOSIS — J432 Centrilobular emphysema: Secondary | ICD-10-CM | POA: Diagnosis not present

## 2022-09-23 DIAGNOSIS — I700 Atherosclerosis of aorta: Secondary | ICD-10-CM | POA: Diagnosis not present

## 2022-09-23 DIAGNOSIS — Z87891 Personal history of nicotine dependence: Secondary | ICD-10-CM | POA: Diagnosis not present

## 2022-09-23 DIAGNOSIS — I2510 Atherosclerotic heart disease of native coronary artery without angina pectoris: Secondary | ICD-10-CM | POA: Diagnosis not present

## 2022-09-23 DIAGNOSIS — R6889 Other general symptoms and signs: Secondary | ICD-10-CM | POA: Diagnosis not present

## 2022-09-24 ENCOUNTER — Encounter: Payer: Self-pay | Admitting: Internal Medicine

## 2022-09-26 ENCOUNTER — Other Ambulatory Visit: Payer: Self-pay | Admitting: Acute Care

## 2022-09-26 ENCOUNTER — Telehealth: Payer: Self-pay

## 2022-09-26 DIAGNOSIS — Z87891 Personal history of nicotine dependence: Secondary | ICD-10-CM

## 2022-09-26 DIAGNOSIS — Z122 Encounter for screening for malignant neoplasm of respiratory organs: Secondary | ICD-10-CM

## 2022-09-26 NOTE — Telephone Encounter (Signed)
Contacted [name] to schedule their annual wellness visit. Appointment made for 10/13/22.  P.J. Quentin Cornwall, CMA (AAMA)

## 2022-09-27 ENCOUNTER — Ambulatory Visit (INDEPENDENT_AMBULATORY_CARE_PROVIDER_SITE_OTHER): Payer: Medicare HMO

## 2022-09-27 DIAGNOSIS — Z7901 Long term (current) use of anticoagulants: Secondary | ICD-10-CM | POA: Diagnosis not present

## 2022-09-27 LAB — POCT INR: INR: 1.8 — AB (ref 2.0–3.0)

## 2022-09-27 NOTE — Patient Instructions (Signed)
Pre visit review using our clinic review tool, if applicable. No additional management support is needed unless otherwise documented below in the visit note. 

## 2022-09-27 NOTE — Progress Notes (Signed)
Checks INR at home, received result of 1.8 from [employer] portal today.  Pt currently on Bactrim, 400/8m, daily for 6 months (until 01/18/23).  Pt has also been prescribed estradiol cream   Increase dose today to take 1 1/2 tablets and then continue 1 tablet daily. Recheck in two week.

## 2022-10-11 ENCOUNTER — Ambulatory Visit (INDEPENDENT_AMBULATORY_CARE_PROVIDER_SITE_OTHER): Payer: Medicare HMO

## 2022-10-11 DIAGNOSIS — Z7901 Long term (current) use of anticoagulants: Secondary | ICD-10-CM

## 2022-10-11 DIAGNOSIS — Z86718 Personal history of other venous thrombosis and embolism: Secondary | ICD-10-CM | POA: Diagnosis not present

## 2022-10-11 LAB — POCT INR: INR: 2.4 (ref 2.0–3.0)

## 2022-10-11 NOTE — Progress Notes (Addendum)
Pt reports eating a lot of raisins lately. Pt currently on Bactrim since beginning of Jan 2024, 400/'80mg'$ , daily for 6 months (until 01/18/23).  Pt has also been prescribed estradiol cream. Continue 1 tablet daily. Recheck in two week.  Contacted pt and advised to continue current dosing and recheck in 2 weeks. Pt verbalized understanding.

## 2022-10-11 NOTE — Patient Instructions (Signed)
Pre visit review using our clinic review tool, if applicable. No additional management support is needed unless otherwise documented below in the visit note. 

## 2022-10-13 DIAGNOSIS — R6889 Other general symptoms and signs: Secondary | ICD-10-CM | POA: Diagnosis not present

## 2022-10-18 ENCOUNTER — Ambulatory Visit (INDEPENDENT_AMBULATORY_CARE_PROVIDER_SITE_OTHER): Payer: Medicare HMO

## 2022-10-18 ENCOUNTER — Telehealth: Payer: Self-pay

## 2022-10-18 VITALS — Ht 64.0 in | Wt 300.0 lb

## 2022-10-18 DIAGNOSIS — Z0000 Encounter for general adult medical examination without abnormal findings: Secondary | ICD-10-CM

## 2022-10-18 DIAGNOSIS — M170 Bilateral primary osteoarthritis of knee: Secondary | ICD-10-CM

## 2022-10-18 DIAGNOSIS — G8929 Other chronic pain: Secondary | ICD-10-CM

## 2022-10-18 NOTE — Telephone Encounter (Signed)
Patient is requesting a rx for elevated toilet seat due to chronic knee pain and back pain.

## 2022-10-18 NOTE — Patient Instructions (Signed)
[name] , Thank you for taking time to come for your Medicare Wellness Visit. I appreciate your ongoing commitment to your health goals. Please review the following plan we discussed and let me know if I can assist you in the future.   These are the goals we discussed:  Goals      Client understands the importance of follow-up with providers by attending scheduled visits        This is a list of the screening recommended for you and due dates:  Health Maintenance  Topic Date Due   Cologuard (Stool DNA test)  06/01/2020   DTaP/Tdap/Td vaccine (2 - Td or Tdap) 06/22/2022   COVID-19 Vaccine (9 - 2023-24 season) 07/30/2022   Zoster (Shingles) Vaccine (2 of 2) 07/30/2022   Mammogram  09/08/2023   Medicare Annual Wellness Visit  10/18/2023   DEXA scan (bone density measurement)  03/28/2025   Pneumonia Vaccine  Completed   Flu Shot  Completed   Hepatitis C Screening: USPSTF Recommendation to screen - Ages 18-79 yo.  Completed   HPV Vaccine  Aged Out    Advanced directives: Yes  Conditions/risks identified: Yes  Next appointment: Follow up in one year for your annual wellness visit.   Preventive Care 69 Years and Older, Female Preventive care refers to lifestyle choices and visits with your health care provider that can promote health and wellness. What does preventive care include? A yearly physical exam. This is also called an annual well check. Dental exams once or twice a year. Routine eye exams. Ask your health care provider how often you should have your eyes checked. Personal lifestyle choices, including: Daily care of your teeth and gums. Regular physical activity. Eating a healthy diet. Avoiding tobacco and drug use. Limiting alcohol use. Practicing safe sex. Taking low-dose aspirin every day. Taking vitamin and mineral supplements as recommended by your health care provider. What happens during an annual well check? The services and screenings done by your  health care provider during your annual well check will depend on your age, overall health, lifestyle risk factors, and family history of disease. Counseling  Your health care provider may ask you questions about your: Alcohol use. Tobacco use. Drug use. Emotional well-being. Home and relationship well-being. Sexual activity. Eating habits. History of falls. Memory and ability to understand (cognition). Work and work Statistician. Reproductive health. Screening  You may have the following tests or measurements: Height, weight, and BMI. Blood pressure. Lipid and cholesterol levels. These may be checked every 5 years, or more frequently if you are over 109 years old. Skin check. Lung cancer screening. You may have this screening every year starting at age 23 if you have a 30-pack-year history of smoking and currently smoke or have quit within the past 15 years. Fecal occult blood test (FOBT) of the stool. You may have this test every year starting at age 6. Flexible sigmoidoscopy or colonoscopy. You may have a sigmoidoscopy every 5 years or a colonoscopy every 10 years starting at age 65. Hepatitis C blood test. Hepatitis B blood test. Sexually transmitted disease (STD) testing. Diabetes screening. This is done by checking your blood sugar (glucose) after you have not eaten for a while (fasting). You may have this done every 1-3 years. Bone density scan. This is done to screen for osteoporosis. You may have this done starting at age 13. Mammogram. This may be done every 1-2 years. Talk to your health care provider about how often you should have  regular mammograms. Talk with your health care provider about your test results, treatment options, and if necessary, the need for more tests. Vaccines  Your health care provider may recommend certain vaccines, such as: Influenza vaccine. This is recommended every year. Tetanus, diphtheria, and acellular pertussis (Tdap, Td) vaccine. You may  need a Td booster every 10 years. Zoster vaccine. You may need this after age 65. Pneumococcal 13-valent conjugate (PCV13) vaccine. One dose is recommended after age 6. Pneumococcal polysaccharide (PPSV23) vaccine. One dose is recommended after age 69. Talk to your health care provider about which screenings and vaccines you need and how often you need them. This information is not intended to replace advice given to you by your health care provider. Make sure you discuss any questions you have with your health care provider. Document Released: 08/21/2015 Document Revised: 04/13/2016 Document Reviewed: 05/26/2015 Elsevier Interactive Patient Education  2017 [place] Prevention in the Home Falls can cause injuries. They can happen to people of all ages. There are many things you can do to make your home safe and to help prevent falls. What can I do on the outside of my home? Regularly fix the edges of walkways and driveways and fix any cracks. Remove anything that might make you trip as you walk through a door, such as a raised step or threshold. Trim any bushes or trees on the path to your home. Use bright outdoor lighting. Clear any walking paths of anything that might make someone trip, such as rocks or tools. Regularly check to see if handrails are loose or broken. Make sure that both sides of any steps have handrails. Any raised decks and porches should have guardrails on the edges. Have any leaves, snow, or ice cleared regularly. Use sand or salt on walking paths during winter. Clean up any spills in your garage right away. This includes oil or grease spills. What can I do in the bathroom? Use night lights. Install grab bars by the toilet and in the tub and shower. Do not use towel bars as grab bars. Use non-skid mats or decals in the tub or shower. If you need to sit down in the shower, use a plastic, non-slip stool. Keep the floor dry. Clean up any water that spills on  the floor as soon as it happens. Remove soap buildup in the tub or shower regularly. Attach bath mats securely with double-sided non-slip rug tape. Do not have throw rugs and other things on the floor that can make you trip. What can I do in the bedroom? Use night lights. Make sure that you have a light by your bed that is easy to reach. Do not use any sheets or blankets that are too big for your bed. They should not hang down onto the floor. Have a firm chair that has side arms. You can use this for support while you get dressed. Do not have throw rugs and other things on the floor that can make you trip. What can I do in the kitchen? Clean up any spills right away. Avoid walking on wet floors. Keep items that you use a lot in easy-to-reach places. If you need to reach something above you, use a strong step stool that has a grab bar. Keep electrical cords out of the way. Do not use floor polish or wax that makes floors slippery. If you must use wax, use non-skid floor wax. Do not have throw rugs and other things on the floor that  can make you trip. What can I do with my stairs? Do not leave any items on the stairs. Make sure that there are handrails on both sides of the stairs and use them. Fix handrails that are broken or loose. Make sure that handrails are as long as the stairways. Check any carpeting to make sure that it is firmly attached to the stairs. Fix any carpet that is loose or worn. Avoid having throw rugs at the top or bottom of the stairs. If you do have throw rugs, attach them to the floor with carpet tape. Make sure that you have a light switch at the top of the stairs and the bottom of the stairs. If you do not have them, ask someone to add them for you. What else can I do to help prevent falls? Wear shoes that: Do not have high heels. Have rubber bottoms. Are comfortable and fit you well. Are closed at the toe. Do not wear sandals. If you use a stepladder: Make sure  that it is fully opened. Do not climb a closed stepladder. Make sure that both sides of the stepladder are locked into place. Ask someone to hold it for you, if possible. Clearly mark and make sure that you can see: Any grab bars or handrails. First and last steps. Where the edge of each step is. Use tools that help you move around (mobility aids) if they are needed. These include: Canes. Walkers. Scooters. Crutches. Turn on the lights when you go into a dark area. Replace any light bulbs as soon as they burn out. Set up your furniture so you have a clear path. Avoid moving your furniture around. If any of your floors are uneven, fix them. If there are any pets around you, be aware of where they are. Review your medicines with your doctor. Some medicines can make you feel dizzy. This can increase your chance of falling. Ask your doctor what other things that you can do to help prevent falls. This information is not intended to replace advice given to you by your health care provider. Make sure you discuss any questions you have with your health care provider. Document Released: 05/21/2009 Document Revised: 12/31/2015 Document Reviewed: 08/29/2014 Elsevier Interactive Patient Education  2017 [employer].

## 2022-10-18 NOTE — Progress Notes (Signed)
I connected with  [name] on 10/18/22 by a audio enabled telemedicine application and verified that I am speaking with the correct person using two identifiers.  Patient Location: Home  Provider Location: Office/Clinic  I discussed the limitations of evaluation and management by telemedicine. The patient expressed understanding and agreed to proceed.  Subjective:   [name] is a 67 y.o. female who presents for Medicare Annual (Subsequent) preventive examination.  Review of Systems     Cardiac Risk Factors include: advanced age (>36mn, >>26women);dyslipidemia;hypertension;family history of premature cardiovascular disease;obesity (BMI >30kg/m2);sedentary lifestyle     Objective:    Today's Vitals   10/18/22 1345 10/18/22 1347  Weight: 300 lb (136.1 kg)   Height: '5\' 4"'$  (1.626 m)   PainSc: 8  8   PainLoc: Back    Body mass index is 51.49 kg/m.     10/18/2022    1:52 PM 06/16/2022    7:27 AM 10/11/2021    2:43 PM 07/25/2021    6:42 PM 09/29/2020   12:42 PM 04/17/2018    1:23 PM 12/07/2017    1:09 PM  Advanced Directives  Does Patient Have a Medical Advance Directive? Yes No No No Yes Yes Yes  Type of AParamedicof [place]Living will     [place] will  Does patient want to make changes to medical advance directive?     No - Patient declined    Copy of HKeyesin Chart? No - copy requested     No - copy requested   Would patient like information on creating a medical advance directive? No - Patient declined No - Patient declined No - Patient declined No - Patient declined       Current Medications (verified) Outpatient Encounter Medications as of 10/18/2022  Medication Sig   levothyroxine (SYNTHROID) 200 MCG tablet TAKE 1 TABLET BY MOUTH EVERY DAY BEFORE BREAKFAST   bisacodyl (DULCOLAX) 5 MG EC tablet Take 30 mg by mouth daily as needed for moderate  constipation.   cephALEXin (KEFLEX) 250 MG capsule Take 1 capsule (250 mg total) by mouth daily.   cephALEXin (KEFLEX) 250 MG capsule Take 1 capsule (250 mg total) by mouth daily.   cephALEXin (KEFLEX) 500 MG capsule Take 1 capsule (500 mg total) by mouth 2 (two) times daily.   estradiol (ESTRACE) 0.1 MG/GM vaginal cream Place 0.5g nightly for two weeks then twice a week after   Glycerin-Hypromellose-PEG 400 (DRY EYE RELIEF DROPS) 0.2-0.2-1 % SOLN Place 1 drop into both eyes as needed (dry eyes).   HYDROmorphone (DILAUDID) 4 MG tablet Take 4-8 mg by mouth every 6 (six) hours as needed (breakthrough pain).   levothyroxine (SYNTHROID) 50 MCG tablet TAKE 1 TABLET (50 MCG TOTAL) BY MOUTH DAILY BEFORE BREAKFAST. TAKE IN ADDITION TO 200 MCG PILL FOR A TOTAL OF 250 MCG DAILY (Patient taking differently: Take 25 mcg by mouth daily before breakfast. Take with 200 mcg every morning)   methadone (DOLOPHINE) 10 MG tablet Take 20 mg by mouth every 8 (eight) hours. for pain   naloxone (NARCAN) nasal spray 4 mg/0.1 mL Place 1 spray into the nose once as needed (opioid overdose).   nystatin-triamcinolone ointment (MYCOLOG) Apply topically 2 (two) times daily. (Patient taking differently: Apply 1 Application topically 2 (two) times daily as needed (irritation).)   RABEprazole (ACIPHEX) 20 MG tablet Take 20 mg by mouth daily as needed (acid reflux).   Skin  Protectants, Misc. (INTERDRY 10"X36") SHEE UAD   sulfamethoxazole-trimethoprim (BACTRIM) 400-80 MG tablet Take 1 tablet by mouth daily.   vitamin B-12 (CYANOCOBALAMIN) 1000 MCG tablet Take 1 tablet (1,000 mcg total) by mouth daily.   warfarin (COUMADIN) 5 MG tablet TAKE 1 DAILY OR AS DIRECTED BY ANTICOAGULATION CLINIC   No facility-administered encounter medications on file as of 10/18/2022.    Allergies (verified) Amitiza [lubiprostone], Morphine, Effexor [venlafaxine], and Nsaids   History: Past Medical History:  Diagnosis Date   Anemia, unspecified     Anxiety    Arthritis    "about q joint i've got" (07/14/2016)   Benign neoplasm of colon 12/2007   Hyperplastic colon polyps   CAD (coronary artery disease)    minimal catheterization, October 2008   Cellulitis    Chest pain    with stress   Childhood asthma    Chronic low back pain    Constipation    and diarrhea chronic..Dr. 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   Methadone adverse reaction    for chronic leg and back pain   Other pulmonary embolism and infarction 2008   Significant 2008 and coumadin therapy RV dysfunction...echo...2008..EF 50%..right ventricle markedly dilated w marked right ventricular dysfunc and moder tricuspid regurg/echo..March 2010, Ef 50%, mild dilation of right ventricule w mild decrease right ventric function    Peripheral vascular disease ([facility])    Rectus sheath hematoma 07/13/2012   On coumadin    Right bundle branch block    intermittent   Thyroid disease    Tobacco use disorder    Urinary incontinence    Venous insufficiency (chronic) (peripheral)    Patient's legs were wrapped 2013   Past Surgical History:  Procedure Laterality Date   CARDIAC CATHETERIZATION  05/2007   Archie Endo 12/08/2010   CHOLECYSTECTOMY OPEN     COLONOSCOPY WITH PROPOFOL N/A 08/11/2017   Procedure: COLONOSCOPY WITH PROPOFOL;  Surgeon: Doran Stabler, MD;  Location: [facility];  Service: Gastroenterology;  Laterality: N/A;   COLONOSCOPY WITH PROPOFOL N/A  06/16/2022   Procedure: COLONOSCOPY WITH PROPOFOL;  Surgeon: Doran Stabler, MD;  Location: [facility];  Service: Gastroenterology;  Laterality: N/A;   GASTRIC BYPASS  1980s   JOINT REPLACEMENT     KNEE ARTHROSCOPY Left 05/2001   /notes 12/21/2010   POLYPECTOMY  06/16/2022   Procedure: POLYPECTOMY;  Surgeon: Doran Stabler, MD;  Location: [facility];  Service: Gastroenterology;;   SHOULDER ARTHROSCOPY W/ ROTATOR CUFF REPAIR Right 08/2002   Archie Endo 12/21/2010   TONSILLECTOMY     TOTAL HIP ARTHROPLASTY Right 01/2008   Family History  Problem Relation Age of Onset   Heart disease Mother    Heart disease Father    Crohn's disease Sister    Osteoporosis Sister        brittle bones   Lung cancer Maternal Grandfather  Other Paternal Grandfather        broken heart   Other Daughter        drug overdose   Social History   Socioeconomic History   Marital status: Divorced    Spouse name: Not on file   Number of children: 1   Years of education: Not on file   Highest education level: Not on file  Occupational History   Occupation: Disabled    Employer: UNEMPLOYED  Tobacco Use   Smoking status: Former    Packs/day: 0.10    Years: 27.00    Total pack years: 2.70    Types: Cigarettes    Quit date: 06/09/2011    Years since quitting: 11.3   Smokeless tobacco: Never  Vaping Use   Vaping Use: Never used  Substance and Sexual Activity   Alcohol use: No    Alcohol/week: 0.0 standard drinks of alcohol    Comment: 07/14/2016 "nothing since the early 1990s"   Drug use: No   Sexual activity: Not Currently  Other Topics Concern   Not on file  Social History Narrative   Current smoker wi last 12 mos.    Social Determinants of Health   Financial Resource Strain: Low Risk  (10/18/2022)   Overall Financial Resource Strain (CARDIA)    Difficulty of Paying Living Expenses: Not hard at all  Food Insecurity: No Food Insecurity (10/18/2022)   Hunger Vital Sign    Worried About  Running Out of Food in the Last Year: Never true    Ran Out of Food in the Last Year: Never true  Transportation Needs: No Transportation Needs (10/18/2022)   PRAPARE - Hydrologist (Medical): No    Lack of Transportation (Non-Medical): No  Physical Activity: Inactive (10/18/2022)   Exercise Vital Sign    Days of Exercise per Week: 0 days    Minutes of Exercise per Session: 0 min  Stress: No Stress Concern Present (10/18/2022)   [place]    Feeling of Stress : Not at all  Social Connections: Moderately Isolated (10/18/2022)   Social Connection and Isolation Panel [NHANES]    Frequency of Communication with Friends and Family: More than three times a week    Frequency of Social Gatherings with Friends and Family: More than three times a week    Attends Religious Services: 1 to 4 times per year    Active Member of [employer] or Organizations: No    Attends Music therapist: Never    Marital Status: Divorced    Tobacco Counseling Counseling given: Not Answered   Clinical Intake:  Pre-visit preparation completed: Yes  Pain : 0-10 Pain Score: 8  Pain Type: Chronic pain Pain Location: Back Pain Orientation: Lower Pain Radiating Towards: bilateral legs Pain Relieving Factors: Pain medications  Pain Relieving Factors: Pain medications  BMI - recorded: 51.49 Nutritional Status: BMI > 30  Obese Nutritional Risks: None Diabetes: No  How often do you need to have someone help you when you read instructions, pamphlets, or other written materials from your doctor or pharmacy?: 1 - Never What is the last grade level you completed in school?: HSG  Diabetic? No  Interpreter Needed?: No  Information entered by :: Lisette Abu, LPN.   Activities of Daily Living    10/18/2022    1:55 PM  In your present state of health, do you have any difficulty performing the following  activities:  Hearing? 0  Vision? 0  Difficulty concentrating or making decisions? 0  Walking or climbing stairs? 0  Dressing or bathing? 0  Doing errands, shopping? 0  Preparing Food and eating ? N  Using the Toilet? N  In the past six months, have you accidently leaked urine? Y  Do you have problems with loss of bowel control? N  Managing your Medications? N  Managing your Finances? N  Housekeeping or managing your Housekeeping? N    Patient Care Team: Binnie Rail, MD as PCP - General (Internal Medicine) Wayland Salinas, MD as Referring Physician (Neurosurgery) Newt Minion, MD as Consulting Physician (Orthopedic Surgery) Myrlene Broker, MD as Attending Physician (Urology) Loletha Carrow Kirke Corin, MD as Consulting Physician (Gastroenterology) Jaquita Folds, MD as Consulting Physician (Obstetrics and Gynecology)  Indicate any recent Medical Services you may have received from other than [facility] providers in the past year (date may be approximate).     Assessment:   This is a routine wellness examination for Korissa.  Hearing/Vision screen Hearing Screening - Comments:: Denies hearing difficulties   Vision Screening - Comments:: Wears reading glasses - up to date with routine eye exams with Marygrace Drought, MD.   Dietary issues and exercise activities discussed: Current Exercise Habits: The patient does not participate in regular exercise at present, Exercise limited by: orthopedic condition(s);respiratory conditions(s)   Goals Addressed             This Visit's Progress    Client understands the importance of follow-up with providers by attending scheduled visits        Depression Screen    10/18/2022    1:56 PM 06/01/2022    8:59 AM 05/18/2022    1:32 PM 05/18/2022    1:24 PM 01/21/2022    3:36 PM 10/11/2021    2:45 PM 10/11/2021    2:36 PM  PHQ 2/9 Scores  PHQ - 2 Score '3 3 2 '$ 0 2 0 0  PHQ- 9 Score '5 4 6  7      '$ Fall Risk    10/18/2022    1:49 PM  06/01/2022    8:59 AM 05/18/2022    1:24 PM 04/01/2022    2:22 PM 01/21/2022    3:36 PM  Fall Risk   Falls in the past year? 0 0 0 0 0  Number falls in past yr: 0 0 0 0 0  Injury with Fall? 0 0 0 0 0  Risk for fall due to : No Fall Risks  No Fall Risks Orthopedic patient No Fall Risks  Follow up Falls prevention discussed Falls evaluation completed Falls evaluation completed Falls evaluation completed Falls evaluation completed    [place]:  Any stairs in or around the home? No  If so, are there any without handrails? No  Home free of loose throw rugs in walkways, pet beds, electrical cords, etc? Yes  Adequate lighting in your home to reduce risk of falls? Yes   ASSISTIVE DEVICES UTILIZED TO PREVENT FALLS:  Life alert? Yes  Use of a cane, walker or w/c? Yes  Grab bars in the bathroom? Yes  Shower chair or bench in shower? Yes  Elevated toilet seat or a handicapped toilet? No   TIMED UP AND GO:  Was the test performed? No . Telephonic Visit  Cognitive Function:        10/18/2022    1:51 PM  6CIT Screen  What Year? 0  points  What month? 0 points  What time? 0 points  Count back from 20 0 points  Months in reverse 0 points  Repeat phrase 0 points  Total Score 0 points    Immunizations Immunization History  Administered Date(s) Administered   Fluad Quad(high Dose 65+) 05/24/2019, 05/26/2020, 06/01/2022   Influenza Split 04/22/2011   Influenza, High Dose Seasonal PF 04/17/2018   Influenza,inj,Quad PF,6+ Mos 06/03/2013, 06/10/2014, 07/15/2016, 04/12/2017   Influenza-Unspecified 06/30/2021   PFIZER Comirnaty(Gray Top)Covid-19 Tri-Sucrose Vaccine 02/27/2021, 06/04/2022   PFIZER(Purple Top)SARS-COV-2 Vaccination 09/14/2019, 10/05/2019, 02/27/2021   Pneumococcal Conjugate-13 06/10/2014   Pneumococcal Polysaccharide-23 05/24/2019   Respiratory Syncytial Virus Vaccine,Recomb Aduvanted(Arexvy) 06/04/2022   Tdap 06/22/2012   Unspecified  SARS-COV-2 Vaccination 09/14/2019, 10/05/2019, 07/28/2020   Zoster Recombinat (Shingrix) 06/04/2022    TDAP status: Due, Education has been provided regarding the importance of this vaccine. Advised may receive this vaccine at local pharmacy or Health Dept. Aware to provide a copy of the vaccination record if obtained from local pharmacy or Health Dept. Verbalized acceptance and understanding.  Flu Vaccine status: Up to date  Pneumococcal vaccine status: Up to date  Covid-19 vaccine status: Completed vaccines  Qualifies for Shingles Vaccine? Yes   Zostavax completed No   Shingrix Completed?: No.    Education has been provided regarding the importance of this vaccine. Patient has been advised to call insurance company to determine out of pocket expense if they have not yet received this vaccine. Advised may also receive vaccine at local pharmacy or Health Dept. Verbalized acceptance and understanding.  Screening Tests Health Maintenance  Topic Date Due   Fecal DNA (Cologuard)  06/01/2020   DTaP/Tdap/Td (2 - Td or Tdap) 06/22/2022   COVID-19 Vaccine (9 - 2023-24 season) 07/30/2022   Zoster Vaccines- Shingrix (2 of 2) 07/30/2022   MAMMOGRAM  09/08/2023   Medicare Annual Wellness (AWV)  10/18/2023   DEXA SCAN  03/28/2025   Pneumonia Vaccine 34+ Years old  Completed   INFLUENZA VACCINE  Completed   Hepatitis C Screening  Completed   HPV VACCINES  Aged Out    Health Maintenance  Health Maintenance Due  Topic Date Due   Fecal DNA (Cologuard)  06/01/2020   DTaP/Tdap/Td (2 - Td or Tdap) 06/22/2022   COVID-19 Vaccine (9 - 2023-24 season) 07/30/2022   Zoster Vaccines- Shingrix (2 of 2) 07/30/2022    Colorectal cancer screening: Type of screening: Colonoscopy. Completed 06/16/2022. Repeat every 5 years  Mammogram status: Completed 09/07/2021. Repeat every year scheduled for 10/2022  Bone Density status: Completed 03/28/2022. Results reflect: Bone density results: OSTEOPENIA. Repeat  every 2-3 years.  Lung Cancer Screening: (Low Dose CT Chest recommended if Age 67-80 years, 30 pack-year currently smoking OR have quit w/in 15years.) does not qualify.   Lung Cancer Screening Referral: no  Additional Screening:  Hepatitis C Screening: does qualify; Completed 06/10/2014  Vision Screening: Recommended annual ophthalmology exams for early detection of glaucoma and other disorders of the eye. Is the patient up to date with their annual eye exam?  Yes  Who is the provider or what is the name of the office in which the patient attends annual eye exams? Marygrace Drought, MD. If pt is not established with a provider, would they like to be referred to a provider to establish care? No .   Dental Screening: Recommended annual dental exams for proper oral hygiene  Community Resource Referral / Chronic Care Management: CRR required this visit?  No   CCM required this  visit?  No      Plan:     I have personally reviewed and noted the following in the patient's chart:   Medical and social history Use of alcohol, tobacco or illicit drugs  Current medications and supplements including opioid prescriptions. Patient is currently taking opioid prescriptions. Information provided to patient regarding non-opioid alternatives. Patient advised to discuss non-opioid treatment plan with their provider. Functional ability and status Nutritional status Physical activity Advanced directives List of other physicians Hospitalizations, surgeries, and ER visits in previous 12 months Vitals Screenings to include cognitive, depression, and falls Referrals and appointments  In addition, I have reviewed and discussed with patient certain preventive protocols, quality metrics, and best practice recommendations. A written personalized care plan for preventive services as well as general preventive health recommendations were provided to patient.     Sheral Flow, LPN   [id number]   Nurse  Notes:  Normal cognitive status assessed by direct observation by this Nurse Health Advisor. No abnormalities found.

## 2022-10-19 NOTE — Addendum Note (Signed)
Addended by: Binnie Rail on: 10/19/2022 09:38 PM   Modules accepted: Orders

## 2022-10-19 NOTE — Telephone Encounter (Signed)
DME printed. Hopefully she will not need a visit

## 2022-10-19 NOTE — Telephone Encounter (Signed)
PCP reports she received clearance paperwork from dentist, [facility]. Reporting pt may be having 1 or 2 teeth extracted or a deep cleaning.  Contacted pt who reports she is having a deep cleaning on 3/21, 2 pm. NO extractions. Advised pt to hold warfarin the day before the cleaning and check INR the morning of the procedure and send result to [employer] [facility]. Advised to restart warfarin either that evening or the next day under the recommendation of the dentist. Advised this nurse will f/u with her once INR result is received on 3/21. Advised pt if any changes to contact coumadin clinic. Pt verbalized understanding.

## 2022-10-20 NOTE — Telephone Encounter (Signed)
Order sent to [facility] today.

## 2022-10-21 ENCOUNTER — Encounter: Payer: Self-pay | Admitting: Internal Medicine

## 2022-10-25 NOTE — Telephone Encounter (Signed)
Pt called to verify when she was to hold warfarin and when she should check INR. Advised hold warfarin day before cleaning and check INR the morning of the cleaning, before going to the dentist. Advised to restart warfarin that day or the day after, dependent on bleeding and dentist recommendation. Pt verbalized understanding.

## 2022-10-27 ENCOUNTER — Telehealth: Payer: Self-pay

## 2022-10-27 DIAGNOSIS — R6889 Other general symptoms and signs: Secondary | ICD-10-CM | POA: Diagnosis not present

## 2022-10-27 NOTE — Telephone Encounter (Signed)
Pt tested INR today because she had a deep cleaning at the dentist. No dosing instructions given today. Pt was given instructions prior to the procedure. Pt denies any bleeding and is doing well. Advised if any bleeding to contact the office of the dentist or go to the ER. Pt verbalized understanding  Pt will retest on 3/27.

## 2022-11-02 ENCOUNTER — Ambulatory Visit (INDEPENDENT_AMBULATORY_CARE_PROVIDER_SITE_OTHER): Payer: Medicare HMO

## 2022-11-02 DIAGNOSIS — Z7901 Long term (current) use of anticoagulants: Secondary | ICD-10-CM

## 2022-11-02 DIAGNOSIS — R6889 Other general symptoms and signs: Secondary | ICD-10-CM | POA: Diagnosis not present

## 2022-11-02 DIAGNOSIS — Z6841 Body Mass Index (BMI) 40.0 and over, adult: Secondary | ICD-10-CM | POA: Diagnosis not present

## 2022-11-02 DIAGNOSIS — R32 Unspecified urinary incontinence: Secondary | ICD-10-CM | POA: Diagnosis not present

## 2022-11-02 DIAGNOSIS — N8110 Cystocele, unspecified: Secondary | ICD-10-CM | POA: Diagnosis not present

## 2022-11-02 DIAGNOSIS — Z1231 Encounter for screening mammogram for malignant neoplasm of breast: Secondary | ICD-10-CM | POA: Diagnosis not present

## 2022-11-02 DIAGNOSIS — Z779 Other contact with and (suspected) exposures hazardous to health: Secondary | ICD-10-CM | POA: Diagnosis not present

## 2022-11-02 DIAGNOSIS — Z124 Encounter for screening for malignant neoplasm of cervix: Secondary | ICD-10-CM | POA: Diagnosis not present

## 2022-11-02 DIAGNOSIS — R87610 Atypical squamous cells of undetermined significance on cytologic smear of cervix (ASC-US): Secondary | ICD-10-CM | POA: Diagnosis not present

## 2022-11-02 DIAGNOSIS — K439 Ventral hernia without obstruction or gangrene: Secondary | ICD-10-CM | POA: Diagnosis not present

## 2022-11-02 DIAGNOSIS — Z1151 Encounter for screening for human papillomavirus (HPV): Secondary | ICD-10-CM | POA: Diagnosis not present

## 2022-11-02 LAB — POCT INR: INR: 1.9 — AB (ref 2.0–3.0)

## 2022-11-02 NOTE — Progress Notes (Signed)
Checks INR at home, received result of 1.9 from [employer] portal today. Pt had deep teeth cleaning on 3/21 and held warfarin the day before the procedure. Pt currently on Bactrim, 400/80mg , daily for 6 months (until 01/18/23).  Pt has also been prescribed estradiol cream. Increase dose today to take 1 1/2 tablets and the continue 1 tablet daily. Recheck in 1 week.  LVM with dosing instructions and recheck date.

## 2022-11-02 NOTE — Patient Instructions (Addendum)
Pre visit review using our clinic review tool, if applicable. No additional management support is needed unless otherwise documented below in the visit note.  Increase dose today to take 1 1/2 tablets and the continue 1 tablet daily. Recheck in 1 week.

## 2022-11-03 ENCOUNTER — Telehealth: Payer: Self-pay

## 2022-11-03 NOTE — Telephone Encounter (Signed)
Pt tested INR and it is 2.2 Pt reports bleeding has decreased significantly and she only had a blood spot on her tissue the size of a pinhead. Advised pt if bleeding restarts and she cannot get it to stop within 30 minutes to go to UC or ER. Pt verbalized understanding.

## 2022-11-03 NOTE — Telephone Encounter (Signed)
Pt c/o epistaxis which started 5 minutes ago. Pt reports it slowed during our conversation which lasted 5 minutes. Last INR yesterday was 1.9  Pt is requesting to test INR again. Advised holding pressure on nose and lean forward. If bleeding stops she can test again. This nurse will f/u in 30 minutes for result. Advised if bleeding worsens or she cannot stop the bleeding in 15 minutes to go to UC or ER. Pt verbalized understanding.

## 2022-11-09 ENCOUNTER — Ambulatory Visit (INDEPENDENT_AMBULATORY_CARE_PROVIDER_SITE_OTHER): Payer: Medicare HMO

## 2022-11-09 ENCOUNTER — Encounter: Payer: Self-pay | Admitting: Gastroenterology

## 2022-11-09 DIAGNOSIS — Z7901 Long term (current) use of anticoagulants: Secondary | ICD-10-CM | POA: Diagnosis not present

## 2022-11-09 LAB — POCT INR: INR: 2.4 (ref 2.0–3.0)

## 2022-11-09 NOTE — Progress Notes (Signed)
Checks INR at home, received result of 2.4 from [employer] portal today. Continue 1 tablet daily. Recheck in 1 week.

## 2022-11-09 NOTE — Patient Instructions (Signed)
Pre visit review using our clinic review tool, if applicable. No additional management support is needed unless otherwise documented below in the visit note. 

## 2022-11-23 DIAGNOSIS — Z86718 Personal history of other venous thrombosis and embolism: Secondary | ICD-10-CM | POA: Diagnosis not present

## 2022-11-23 DIAGNOSIS — Z7901 Long term (current) use of anticoagulants: Secondary | ICD-10-CM | POA: Diagnosis not present

## 2022-11-23 LAB — POCT INR: INR: 2 (ref 2.0–3.0)

## 2022-11-24 ENCOUNTER — Ambulatory Visit (INDEPENDENT_AMBULATORY_CARE_PROVIDER_SITE_OTHER): Payer: Medicare HMO

## 2022-11-24 DIAGNOSIS — Z7901 Long term (current) use of anticoagulants: Secondary | ICD-10-CM | POA: Diagnosis not present

## 2022-11-24 NOTE — Patient Instructions (Addendum)
Pre visit review using our clinic review tool, if applicable. No additional management support is needed unless otherwise documented below in the visit note.  Continue 1 tablet daily. Recheck in 2 week.

## 2022-11-24 NOTE — Progress Notes (Addendum)
Checks INR at home, received result of 2.0 from [employer] portal today. Continue 1 tablet daily. Recheck in 2 week.   Pt understands to continue normal dosing and recheck in 2 weeks if her INR is in range and dose not need a call.

## 2022-12-01 NOTE — Patient Instructions (Addendum)
     An EKG was done today.    Blood work was ordered.   The lab is on the first floor.    Medications changes include :   none    A referral was ordered for cardiology.     Someone will call you to schedule an appointment.    A Ct scan of your abdomen was ordered.    Return in about 6 months (around 06/03/2023) for Physical Exam.

## 2022-12-01 NOTE — Progress Notes (Signed)
Subjective:    Patient ID: [name], female    DOB: [birth year], 67 y.o.   MRN: [id number]     HPI Tina is here for follow up of her chronic medical problems.    Stabbing pain in right prox and distal lower leg.  It is very painful.  It comes and it goes.  She is says sometimes on her left leg as well.  She wondered what the cause was.  For several days she does not get her methadone from the pharmacy so she went without it.  She noticed that she had significant lower abdominal pain when she was not taking the methadone and when she restarted it it was much better.  She can still feel it and she is concerned about what the cause of the pain is.  She does have chronic constipation and has to take Dulcolax a couple of times a week to have a bowel movement.  There has been no change.   She gets short of breath when she is walking to the mailbox.  That is something that is new and has been getting worse.  Medications and allergies reviewed with patient and updated if appropriate.  Current Outpatient Medications on File Prior to Visit  Medication Sig Dispense Refill   bisacodyl (DULCOLAX) 5 MG EC tablet Take 30 mg by mouth daily as needed for moderate constipation.     calcium carbonate (OS-CAL) 1250 (500 Ca) MG chewable tablet Chew by mouth.     cephALEXin (KEFLEX) 250 MG capsule Take 1 capsule (250 mg total) by mouth daily. 30 capsule 5   estradiol (ESTRACE) 0.1 MG/GM vaginal cream Place 0.5g nightly for two weeks then twice a week after 30 g 11   Glycerin-Hypromellose-PEG 400 (DRY EYE RELIEF DROPS) 0.2-0.2-1 % SOLN Place 1 drop into both eyes as needed (dry eyes).     HYDROmorphone (DILAUDID) 4 MG tablet Take 4-8 mg by mouth every 6 (six) hours as needed (breakthrough pain).  0   levothyroxine (SYNTHROID) 200 MCG tablet TAKE 1 TABLET BY MOUTH EVERY DAY BEFORE BREAKFAST 90 tablet 0   levothyroxine (SYNTHROID) 50 MCG tablet TAKE 1 TABLET (50 MCG TOTAL) BY MOUTH DAILY BEFORE  BREAKFAST. TAKE IN ADDITION TO 200 MCG PILL FOR A TOTAL OF 250 MCG DAILY (Patient taking differently: Take 25 mcg by mouth daily before breakfast. Take with 200 mcg every morning) 90 tablet 3   methadone (DOLOPHINE) 10 MG tablet Take 20 mg by mouth every 8 (eight) hours. for pain  0   naloxone (NARCAN) nasal spray 4 mg/0.1 mL Place 1 spray into the nose once as needed (opioid overdose).     nystatin-triamcinolone ointment (MYCOLOG) Apply topically 2 (two) times daily. (Patient taking differently: Apply 1 Application topically 2 (two) times daily as needed (irritation).) 30 g 2   RABEprazole (ACIPHEX) 20 MG tablet Take 20 mg by mouth daily as needed (acid reflux).     Skin Protectants, Misc. (INTERDRY 10"X36") SHEE UAD 30 each 5   sulfamethoxazole-trimethoprim (BACTRIM) 400-80 MG tablet Take 1 tablet by mouth daily. 30 tablet 5   vitamin B-12 (CYANOCOBALAMIN) 1000 MCG tablet Take 1 tablet (1,000 mcg total) by mouth daily.     warfarin (COUMADIN) 5 MG tablet TAKE 1 DAILY OR AS DIRECTED BY ANTICOAGULATION CLINIC 110 tablet 1   No current facility-administered medications on file prior to visit.     Review of Systems  Constitutional:  Negative for fever.  Respiratory:  Positive  for shortness of breath (walking to mailbox - new). Negative for cough and wheezing.   Cardiovascular:  Positive for palpitations (occ) and leg swelling. Negative for chest pain.  Gastrointestinal:  Positive for abdominal pain (lower abdomen), anal bleeding and constipation (takes 5 bisacodyl TIW). Negative for blood in stool and diarrhea.  Genitourinary:  Negative for dysuria, frequency and hematuria.       Abn color to urine  Musculoskeletal:  Positive for back pain.  Neurological:  Negative for light-headedness and headaches.       Objective:   Vitals:   12/02/22 1307  BP: 138/74  Pulse: 64  Temp: 98.3 F (36.8 C)  SpO2: 96%   BP Readings from Last 3 Encounters:  12/02/22 138/74  07/08/22 114/67  06/16/22  104/74   Wt Readings from Last 3 Encounters:  12/02/22 (!) 323 lb (146.5 kg)  10/18/22 300 lb (136.1 kg)  06/16/22 (!) 316 lb (143.3 kg)   Body mass index is 55.44 kg/m.    Physical Exam Constitutional:      General: She is not in acute distress.    Appearance: Normal appearance.  HENT:     Head: Normocephalic and atraumatic.  Eyes:     Conjunctiva/sclera: Conjunctivae normal.  Cardiovascular:     Rate and Rhythm: Normal rate and regular rhythm.     Heart sounds: Normal heart sounds.  Pulmonary:     Effort: Pulmonary effort is normal. No respiratory distress.     Breath sounds: Normal breath sounds. No wheezing.  Abdominal:     General: There is no distension.     Palpations: Abdomen is soft.     Tenderness: There is abdominal tenderness (lower abdomen - mostly in LLQ). There is no guarding or rebound.  Musculoskeletal:     Cervical back: Neck supple.     Right lower leg: Edema (lymphedema - leg is tight) present.     Left lower leg: Edema (lymphedema - leg is tight) present.  Lymphadenopathy:     Cervical: No cervical adenopathy.  Skin:    General: Skin is warm and dry.     Findings: No rash.  Neurological:     Mental Status: She is alert. Mental status is at baseline.  Psychiatric:        Mood and Affect: Mood normal.        Behavior: Behavior normal.        Lab Results  Component Value Date   WBC 4.9 04/01/2022   HGB 12.8 04/01/2022   HCT 39.0 04/01/2022   PLT 169.0 04/01/2022   GLUCOSE 80 04/01/2022   CHOL 119 10/12/2021   TRIG 102.0 10/12/2021   HDL 29.40 (L) 10/12/2021   LDLDIRECT 105.0 05/24/2019   LDLCALC 70 10/12/2021   ALT 10 04/01/2022   AST 15 04/01/2022   NA 136 04/01/2022   K 3.7 04/01/2022   CL 99 04/01/2022   CREATININE 0.69 04/01/2022   BUN 7 04/01/2022   CO2 32 04/01/2022   TSH 2.27 06/01/2022   INR 2.0 11/23/2022   HGBA1C 5.5 04/17/2018     Assessment & Plan:    See Problem List for Assessment and Plan of chronic medical  problems.

## 2022-12-02 ENCOUNTER — Ambulatory Visit (INDEPENDENT_AMBULATORY_CARE_PROVIDER_SITE_OTHER): Payer: Medicare HMO | Admitting: Internal Medicine

## 2022-12-02 ENCOUNTER — Encounter: Payer: Self-pay | Admitting: Internal Medicine

## 2022-12-02 VITALS — BP 138/74 | HR 64 | Temp 98.3°F | Ht 64.0 in | Wt 323.0 lb

## 2022-12-02 DIAGNOSIS — R1030 Lower abdominal pain, unspecified: Secondary | ICD-10-CM | POA: Diagnosis not present

## 2022-12-02 DIAGNOSIS — Z86711 Personal history of pulmonary embolism: Secondary | ICD-10-CM

## 2022-12-02 DIAGNOSIS — K760 Fatty (change of) liver, not elsewhere classified: Secondary | ICD-10-CM | POA: Diagnosis not present

## 2022-12-02 DIAGNOSIS — I2510 Atherosclerotic heart disease of native coronary artery without angina pectoris: Secondary | ICD-10-CM | POA: Diagnosis not present

## 2022-12-02 DIAGNOSIS — R0609 Other forms of dyspnea: Secondary | ICD-10-CM | POA: Diagnosis not present

## 2022-12-02 DIAGNOSIS — R3989 Other symptoms and signs involving the genitourinary system: Secondary | ICD-10-CM | POA: Insufficient documentation

## 2022-12-02 DIAGNOSIS — R6889 Other general symptoms and signs: Secondary | ICD-10-CM | POA: Diagnosis not present

## 2022-12-02 DIAGNOSIS — E039 Hypothyroidism, unspecified: Secondary | ICD-10-CM

## 2022-12-02 DIAGNOSIS — E782 Mixed hyperlipidemia: Secondary | ICD-10-CM | POA: Diagnosis not present

## 2022-12-02 DIAGNOSIS — J439 Emphysema, unspecified: Secondary | ICD-10-CM | POA: Diagnosis not present

## 2022-12-02 LAB — COMPREHENSIVE METABOLIC PANEL
ALT: 12 U/L (ref 0–35)
AST: 20 U/L (ref 0–37)
Albumin: 3.8 g/dL (ref 3.5–5.2)
Alkaline Phosphatase: 75 U/L (ref 39–117)
BUN: 10 mg/dL (ref 6–23)
CO2: 32 mEq/L (ref 19–32)
Calcium: 9.2 mg/dL (ref 8.4–10.5)
Chloride: 98 mEq/L (ref 96–112)
Creatinine, Ser: 0.65 mg/dL (ref 0.40–1.20)
GFR: 90.12 mL/min (ref >60.00)
Glucose, Bld: 84 mg/dL (ref 70–99)
Potassium: 3.7 mEq/L (ref 3.5–5.1)
Sodium: 136 mEq/L (ref 135–145)
Total Bilirubin: 0.7 mg/dL (ref 0.2–1.2)
Total Protein: 7.8 g/dL (ref 6.0–8.3)

## 2022-12-02 LAB — CBC WITH DIFFERENTIAL/PLATELET
Basophils Absolute: 0 10*3/uL (ref 0.0–0.1)
Basophils Relative: 0.6 % (ref 0.0–3.0)
Eosinophils Absolute: 0.1 10*3/uL (ref 0.0–0.7)
Eosinophils Relative: 1.7 % (ref 0.0–5.0)
HCT: 40.2 % (ref 36.0–46.0)
Hemoglobin: 13.2 g/dL (ref 12.0–15.0)
Lymphocytes Relative: 25.9 % (ref 12.0–46.0)
Lymphs Abs: 1.3 10*3/uL (ref 0.7–4.0)
MCHC: 32.9 g/dL (ref 30.0–36.0)
MCV: 88.6 fl (ref 78.0–100.0)
Monocytes Absolute: 0.5 10*3/uL (ref 0.1–1.0)
Monocytes Relative: 9.4 % (ref 3.0–12.0)
Neutro Abs: 3.2 10*3/uL (ref 1.4–7.7)
Neutrophils Relative %: 62.4 % (ref 43.0–77.0)
Platelets: 185 10*3/uL (ref 150.0–400.0)
RBC: 4.54 Mil/uL (ref 3.87–5.11)
RDW: 15.2 % (ref 11.5–15.5)
WBC: 5.1 10*3/uL (ref 4.0–10.5)

## 2022-12-02 LAB — URINALYSIS, ROUTINE W REFLEX MICROSCOPIC
Bilirubin Urine: NEGATIVE
Ketones, ur: NEGATIVE
Nitrite: NEGATIVE
Specific Gravity, Urine: <=1.005 — AB (ref 1.000–1.030)
Total Protein, Urine: NEGATIVE
Urine Glucose: NEGATIVE
Urobilinogen, UA: 0.2 (ref 0.0–1.0)
pH: 5.5 (ref 5.0–8.0)

## 2022-12-02 LAB — HEMOGLOBIN A1C: Hgb A1c MFr Bld: 5.6 % (ref 4.6–6.5)

## 2022-12-02 LAB — LIPID PANEL
Cholesterol: 136 mg/dL (ref 0–200)
HDL: 32.6 mg/dL — ABNORMAL LOW (ref >39.00)
LDL Cholesterol: 76 mg/dL (ref 0–99)
NonHDL: 103.1
Total CHOL/HDL Ratio: 4
Triglycerides: 136 mg/dL (ref 0.0–149.0)
VLDL: 27.2 mg/dL (ref 0.0–40.0)

## 2022-12-02 LAB — TSH: TSH: 0.81 u[IU]/mL (ref 0.35–5.50)

## 2022-12-02 MED ORDER — NYSTATIN-TRIAMCINOLONE 100000-0.1 UNIT/GM-% EX OINT
1.0000 | TOPICAL_OINTMENT | Freq: Two times a day (BID) | CUTANEOUS | 5 refills | Status: DC | PRN
  Filled 2023-10-17: qty 120, 60d supply, fill #0

## 2022-12-02 NOTE — Assessment & Plan Note (Signed)
Acute History of recurrent UTIs Urine with abnormal color ?  UTI Is having abdominal pain-?  Etiology Check UA, urine culture

## 2022-12-02 NOTE — Assessment & Plan Note (Signed)
Seen on imaging Advised weight and healthy diet

## 2022-12-02 NOTE — Assessment & Plan Note (Signed)
Chronic Regular exercise and healthy diet encouraged Check lipid panel  Currently lifestyle controlled, statin intolerant - defers statin  Lab Results  Component Value Date   LDLCALC 76 12/02/2022

## 2022-12-02 NOTE — Assessment & Plan Note (Signed)
Chronic Lifelong anticoagulation with Coumadin-monitored by her Coumadin nurse-Shannon She checks her INR at home

## 2022-12-02 NOTE — Assessment & Plan Note (Signed)
Chronic Having dyspnea on exertion when walking to the mailbox, which is new since she was here last-states it is getting worse ?  Cardiac versus pulmonary in nature EKG today: Poor baseline making interpretation difficult, sinus bradycardia with one  PVCs at 58 bpm, LAD.  Compared to previous EKG from 07/2016 sinus bradycardia is new, but no other significant change

## 2022-12-02 NOTE — Assessment & Plan Note (Signed)
Acute Noticed that when she was not on her methadone she had significant pain across her lower abdomen She is tender in her left lower quadrant slightly tender in suprapubic region UA, urine culture to rule out UTI CBC, CMP Given degree of tenderness CT of the abdomen pelvis

## 2022-12-02 NOTE — Assessment & Plan Note (Signed)
New The last 6 months has noticed dyspnea on exertion when going to the mailbox ?  Cardiac versus pulmonary, weight and deconditioning are likely contributing but that has not changed She does have COPD so this could be related to her COPD Has CAD and several risk factors EKG today stable Will refer to cardiology Check blood work Had recent CT scan of lungs.  Would hold off on pulmonary referral for right now

## 2022-12-02 NOTE — Assessment & Plan Note (Signed)
Chronic BMI 55.44 She knows weight loss would be helpful Wonders about [name] this will be covered at this time and supply would be an issue Given all of her acute issues I do not think that is a good idea at this time Decrease portions, as much exercise as possible

## 2022-12-02 NOTE — Assessment & Plan Note (Signed)
Chronic Due for follow-up TSH Clinically euthyroid Check tsh and will titrate med dose if needed Currently taking levothyroxine 225 mcg daily 

## 2022-12-03 ENCOUNTER — Encounter: Payer: Self-pay | Admitting: Internal Medicine

## 2022-12-03 LAB — URINE CULTURE: Result:: NO GROWTH

## 2022-12-06 ENCOUNTER — Other Ambulatory Visit: Payer: Self-pay | Admitting: Internal Medicine

## 2022-12-07 ENCOUNTER — Ambulatory Visit (INDEPENDENT_AMBULATORY_CARE_PROVIDER_SITE_OTHER): Payer: Medicare HMO

## 2022-12-07 DIAGNOSIS — Z7901 Long term (current) use of anticoagulants: Secondary | ICD-10-CM

## 2022-12-07 LAB — POCT INR: INR: 2 (ref 2.0–3.0)

## 2022-12-07 NOTE — Patient Instructions (Signed)
Pre visit review using our clinic review tool, if applicable. No additional management support is needed unless otherwise documented below in the visit note. 

## 2022-12-07 NOTE — Progress Notes (Signed)
Checks INR at home, received result of 2.0 from [facility] portal today. Continue 1 tablet daily. Recheck in 2 week. Pt understands to continue normal dosing and recheck in 2 weeks. LVM with instructions.

## 2022-12-07 NOTE — Progress Notes (Signed)
I have reviewed and agree with note, evaluation, plan.   Brittane Grudzinski, MD  

## 2022-12-08 ENCOUNTER — Encounter: Payer: Self-pay | Admitting: Internal Medicine

## 2022-12-10 ENCOUNTER — Encounter: Payer: Self-pay | Admitting: Internal Medicine

## 2022-12-12 ENCOUNTER — Other Ambulatory Visit: Payer: Medicare HMO

## 2022-12-15 ENCOUNTER — Ambulatory Visit
Admission: RE | Admit: 2022-12-15 | Discharge: 2022-12-15 | Disposition: A | Discharge status: Home / Self Care | Payer: Medicare HMO | Source: Ambulatory Visit | Attending: Internal Medicine | Admitting: Internal Medicine

## 2022-12-15 DIAGNOSIS — R1032 Left lower quadrant pain: Secondary | ICD-10-CM | POA: Diagnosis not present

## 2022-12-15 DIAGNOSIS — R1030 Lower abdominal pain, unspecified: Secondary | ICD-10-CM

## 2022-12-15 DIAGNOSIS — R6889 Other general symptoms and signs: Secondary | ICD-10-CM | POA: Diagnosis not present

## 2022-12-15 MED ORDER — IOPAMIDOL (ISOVUE-300) INJECTION 61%
100.0000 mL | Freq: Once | INTRAVENOUS | Status: AC | PRN
  Administered 2022-12-15: 100 mL via INTRAVENOUS

## 2022-12-19 ENCOUNTER — Encounter: Payer: Self-pay | Admitting: Gastroenterology

## 2022-12-19 ENCOUNTER — Encounter: Payer: Self-pay | Admitting: Internal Medicine

## 2022-12-19 ENCOUNTER — Other Ambulatory Visit: Payer: Medicare HMO

## 2022-12-19 MED ORDER — AMOXICILLIN-POT CLAVULANATE 875-125 MG PO TABS: 1.0000 | ORAL_TABLET | Freq: Two times a day (BID) | ORAL | 0 refills | Status: AC

## 2022-12-19 NOTE — Addendum Note (Signed)
Addended by: Pincus Sanes on: 12/19/2022 09:45 AM   Modules accepted: Orders

## 2022-12-21 ENCOUNTER — Ambulatory Visit (INDEPENDENT_AMBULATORY_CARE_PROVIDER_SITE_OTHER): Payer: Medicare HMO

## 2022-12-21 DIAGNOSIS — Z7901 Long term (current) use of anticoagulants: Secondary | ICD-10-CM | POA: Diagnosis not present

## 2022-12-21 LAB — POCT INR: INR: 2.3 (ref 2.0–3.0)

## 2022-12-21 NOTE — Patient Instructions (Addendum)
Pre visit review using our clinic review tool, if applicable. No additional management support is needed unless otherwise documented below in the visit note.  Continue 1 tablet daily. Recheck in 3 week

## 2022-12-21 NOTE — Progress Notes (Signed)
Pt was prescribed Amoxicillin on 5/13 for diverticulitis. No interaction with warfarin. Checks INR at home, received result of 2.3 from [facility] portal today. Continue 1 tablet daily. Recheck in 3 week Advised pt of dosing and recheck. Pt verbalized understanding.

## 2022-12-21 NOTE — Progress Notes (Addendum)
Pt reports she is not taking amoxicillin, she is taking Augmentin. Pt will recheck in 1 week due. Pt was prescribed Amoxicillin on 5/13 for diverticulitis. Pt reports she is not taking amoxicillin, she is taking Augmentin. Pt will recheck in 1 week due to interaction.o interaction.

## 2022-12-21 NOTE — Progress Notes (Signed)
I have reviewed and agree with note, evaluation, plan.   Johnnette Laux, MD  

## 2022-12-27 ENCOUNTER — Encounter: Payer: Self-pay | Admitting: Gastroenterology

## 2022-12-28 ENCOUNTER — Ambulatory Visit (INDEPENDENT_AMBULATORY_CARE_PROVIDER_SITE_OTHER): Payer: Medicare HMO

## 2022-12-28 DIAGNOSIS — Z7901 Long term (current) use of anticoagulants: Secondary | ICD-10-CM | POA: Diagnosis not present

## 2022-12-28 LAB — POCT INR: INR: 1.9 — AB (ref 2.0–3.0)

## 2022-12-28 NOTE — Progress Notes (Signed)
Pt was prescribed Augmentin on 5/13 x 10 days. No dosing changes to warfarin were made for this abx. Checks INR at home, received result of 1.9 from [facility] portal today. Increase dose today to take 1 1/2 tablets and then continue 1 tablet daily. Recheck in 2 week, on 6/5. LVM with instructions.

## 2022-12-28 NOTE — Telephone Encounter (Signed)
Next available with me or APP  - HD

## 2022-12-28 NOTE — Patient Instructions (Addendum)
Pre visit review using our clinic review tool, if applicable. No additional management support is needed unless otherwise documented below in the visit note.  Increase dose today to take 1 1/2 tablets and then continue 1 tablet daily. Recheck in 2 week, on 6/5.

## 2022-12-28 NOTE — Progress Notes (Signed)
I have reviewed and agree with note, evaluation, plan.   Loreena Valeri, MD  

## 2022-12-31 ENCOUNTER — Encounter: Payer: Self-pay | Admitting: Internal Medicine

## 2023-01-05 ENCOUNTER — Other Ambulatory Visit: Payer: Medicare HMO

## 2023-01-08 ENCOUNTER — Encounter: Payer: Self-pay | Admitting: Internal Medicine

## 2023-01-09 ENCOUNTER — Ambulatory Visit: Payer: Medicare HMO | Admitting: Obstetrics and Gynecology

## 2023-01-09 ENCOUNTER — Encounter: Payer: Self-pay | Admitting: Cardiology

## 2023-01-09 ENCOUNTER — Ambulatory Visit: Payer: Medicare HMO | Attending: Cardiology | Admitting: Cardiology

## 2023-01-09 VITALS — BP 120/80 | HR 61 | Ht 65.0 in | Wt 324.0 lb

## 2023-01-09 DIAGNOSIS — R6889 Other general symptoms and signs: Secondary | ICD-10-CM | POA: Diagnosis not present

## 2023-01-09 DIAGNOSIS — I872 Venous insufficiency (chronic) (peripheral): Secondary | ICD-10-CM | POA: Diagnosis not present

## 2023-01-09 DIAGNOSIS — R252 Cramp and spasm: Secondary | ICD-10-CM

## 2023-01-09 DIAGNOSIS — E782 Mixed hyperlipidemia: Secondary | ICD-10-CM | POA: Diagnosis not present

## 2023-01-09 DIAGNOSIS — I890 Lymphedema, not elsewhere classified: Secondary | ICD-10-CM | POA: Diagnosis not present

## 2023-01-09 DIAGNOSIS — Z7901 Long term (current) use of anticoagulants: Secondary | ICD-10-CM

## 2023-01-09 DIAGNOSIS — R0609 Other forms of dyspnea: Secondary | ICD-10-CM | POA: Diagnosis not present

## 2023-01-09 DIAGNOSIS — Z86711 Personal history of pulmonary embolism: Secondary | ICD-10-CM | POA: Diagnosis not present

## 2023-01-09 DIAGNOSIS — R296 Repeated falls: Secondary | ICD-10-CM

## 2023-01-09 DIAGNOSIS — R002 Palpitations: Secondary | ICD-10-CM

## 2023-01-09 MED ORDER — LEVOTHYROXINE SODIUM 25 MCG PO TABS: 25.0000 ug | ORAL_TABLET | Freq: Every day | ORAL | 1 refills | Status: DC

## 2023-01-09 MED ORDER — LEVOTHYROXINE SODIUM 200 MCG PO TABS: ORAL_TABLET | ORAL | 1 refills | Status: DC

## 2023-01-09 NOTE — Patient Instructions (Addendum)
Medication Instructions:  No changes  *If you need a refill on your cardiac medications before your next appointment, please call your pharmacy*   Lab Work: Not needed    Testing/Procedures: Will be schedule at [address] Your physician has requested that you have an echocardiogram. Echocardiography is a painless test that uses sound waves to create images of your heart. It provides your doctor with information about the size and shape of your heart and how well your heart's chambers and valves are working. This procedure takes approximately one hour. There are no restrictions for this procedure. Please do NOT wear cologne, perfume, aftershave, or lotions (deodorant is allowed). Please arrive 15 minutes prior to your appointment time.    Follow-Up: At [facility], you and your health needs are our priority.  As part of our continuing mission to provide you with exceptional heart care, we have created designated Provider Care Teams.  These Care Teams include your primary Cardiologist (physician) and Advanced Practice Providers (APPs -  Physician Assistants and Nurse Practitioners) who all work together to provide you with the care you need, when you need it.     Your next appointment:    2 to 3 month(s)  The format for your next appointment:   In Person  Provider:   Bryan Lemma, MD    Other Instructions  KardiaMobile [email or web address]        FDA-cleared, clinical grade mobile EKG monitor: Lourena Simmonds is the most clinically-validated mobile EKG used by the world's leading cardiac care medical professionals With Basic service, know instantly if your heart rhythm is normal or if atrial fibrillation is detected, and email the last single EKG recording to yourself or your doctor Premium service, available for purchase through the [employer] app for $9.99 per month or $99 per year, includes unlimited history and storage of your  EKG recordings, a monthly EKG summary report to share with your doctor, along with the ability to track your blood pressure, activity and weight Includes one [employer]ia[employer] phone clip FREE SHIPPING: Standard delivery 1-3 business days. Orders placed by 11:00am PST will ship that afternoon. Otherwise, will ship next business day. All orders ship via [employer] from [place], [place]    [employer] - sending an EKG Download app and set up profile. Run EKG - by placing 1-2 fingers on the silver plates After EKG is complete - Download PDF  - Skip password (if you apply a password the provider will need it to view the EKG) Click share button (square with upward arrow) in bottom left corner To send: choose MyChart (first time log into MyChart)  Pop up window about sending ECG Click continue Choose type of message Choose provider Type subject and message Click send (EKG should be attached)  - To send additional EKGs in one message click the paperclip image and bottom of page to attach.

## 2023-01-09 NOTE — Telephone Encounter (Signed)
Duplicate.. responded to 1st email.Marland KitchenRaechel Chute

## 2023-01-09 NOTE — Progress Notes (Signed)
Primary Care Provider: Pincus Sanes, MD [facility] Cardiologist: Bryan Lemma, MD Electrophysiologist: None  Clinic Note: Chief Complaint  Patient presents with   New Patient (Initial Visit)    Shortness of breath on exertion  -> She seems to think this is because she is out of shape.   ===================================  ASSESSMENT/PLAN   Problem List Items Addressed This Visit       Cardiology Problems   Venous insufficiency (chronic) (peripheral) (Chronic)    Longstanding issue.  Certainly not helping her walking.  She may actually be on maintenance dose of Keflex that was listed.  Was unsure if she is taking or not.  Stressed importance of foot elevation and I wonder if she would benefit from wound care for Unaboot's or even lymphedema pumps.  She had been seen by vein and vascular back in 2012.  Not sure that there is much that can be done with deep and superficial venous reflux-from a mechanical standpoint other than compression and foot elevation.      Hyperlipidemia (Chronic)    LDL was 76.  Relatively well-controlled in the absence of any active cardiac risk factors.  She is not medications.        Other   Severe obesity (BMI >= 40) (HCC)   Palpitations (Chronic)    She does have these palpitation episodes but did not happen within a frequency that we do actually capture them on the monitor.  I think at this point my recommendation would be for her to do a self monitoring technique with a portable monitor symptoms K[employer].  We have provided her information for that device.      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  Has been followed by warfarin clinic.  Perhaps the choice was warfarin over DOAC because of overall cost.  Will defer management to warfarin clinic and PCP.      LEG CRAMPS, NOCTURNAL (Chronic)    Nocturnal  leg cramps are probably related to venous stasis.  Not sure how much she would benefit from compression stockings, but would defer to vein and vascular if there are any other options.  Consider lymphedema pumps.      History of pulmonary embolism (Chronic)    Extensive PEs back in January 2019, now on lifelong warfarin.  No real bleeding issues.  This is followed by her PCP and warfarin clinic  As far as I can tell, no recurrent PEs.  Will check a 2D echo just to see if there is any evidence of pulm hypertension from recurrent PEs and obesity.      Relevant Orders   ECHOCARDIOGRAM COMPLETE   Falls frequently (Chronic)    Somewhat concerning that she falls being on warfarin.  I think she definitely needs to use the walker.      DOE (dyspnea on exertion) - Primary    Exertional dyspnea is probably baseline for her.  She is morbidly obese, very deconditioned and immobilized.  It is plenty of effort for her to do to walk because of her hip back and knee pain.  Will check a 2D echo just to ensure that her left ventricular function is normal given the PVCs on EKG.  Would also like to evaluate the RV size and pressures.  Would not want to consider further testing until we see the results of the echocardiogram.  If there is a reduced EF or regional wall motion abnormality,, we would potentially consider ischemic evaluation.      Relevant Orders   EKG 12-Lead (Completed)   ECHOCARDIOGRAM COMPLETE   Chronic acquired lymphedema    Question if she would benefit from lymphedema pump       ===================================  HPI:    [name] is a super- overly obese 67 y.o. 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  [name] was referred by Dr. Lawerance Bach after being seen on  12/02/2022.  She noted that she would get short of breath walking to the mailbox and this is something new to her and getting worse.  She also noted having very painful sharp stabbing pain in the right and left leg.  She was having issues with the methadone.  Noted leg swelling and occasional palpitation.  Recent Hospitalizations: None  Reviewed  CV studies:    The following studies were reviewed today: (if available, images/films reviewed: From Epic Chart or Care Everywhere) Lower Extremity venous reflux exam 07/07/2011: Bilateral GSV veins are not competent.  The deep venous system is not competent with reflux.  The calf veins not visualized due to skin sensitivity and ulceration.  TTE 07/14/2016: (For syncope).  Mild concentric LVH but otherwise normal LV size and function.  EF estimated 50% with no RWMA.  Mild MR.  Moderate LA dilation indicative of elevated LV filling pressures.   Interval History:   [name] presents here today for evaluation.  She does note that she has been getting short of breath.  But she actually just started doing a walking regiment with her friend using a rollator walker still walk along a trail which is almost a mile.  She is not able to do the whole thing without stopping, but she is able to completed.  She clearly is short of breath when she walks but I think her exertional dyspnea is probably more because she is doing more.  She does get short of breath walking to the mailbox and back.  She denies any chest pain or pressure associated with it.  She says her walking is limited more by back knee and hip pain as well as buttock pain.  She is also noting short refill episodes of fluttering palpitations that have been a couple times a month.  Usually occur at rest or when she is under a lot of stress.  The oftentimes will break with a cough.  She has chronic lower extremity swelling but no PND orthopnea symptoms.  Has not had syncope or near syncope.  No TIA or  emesis..  No claudication.-She does not walk enough to no claudication.  REVIEWED OF SYSTEMS   Review of Systems  Constitutional:  Positive for malaise/fatigue.  Respiratory:  Positive for cough and shortness of breath.   Cardiovascular:  Positive for leg swelling.  Gastrointestinal:  Positive for abdominal pain and constipation. Negative for blood in stool and melena.  Genitourinary:  Negative for hematuria.  Musculoskeletal:  Positive for back pain, joint pain (Hips and knees) and myalgias.  Neurological:  Positive for dizziness. Negative for loss of consciousness.  Endo/Heme/Allergies:  Bruises/bleeds easily.  Psychiatric/Behavioral:  Negative for depression (not obviously) and memory loss. The patient is nervous/anxious. The patient does not have insomnia (Not sleeping well at  night.  Hard to get to sleep and then will wake up to urinate, then cannot go back to sleep.).   New patient I have reviewed and (if needed) personally updated the patient's problem list, medications, allergies, past medical and surgical history, social and family history.   PAST MEDICAL HISTORY   Past Medical History:  Diagnosis Date   Anemia, unspecified    Anxiety    Arthritis    "about q joint i've got" (07/14/2016)   Benign neoplasm of colon 12/2007   Hyperplastic colon polyps   CAD (coronary artery disease)    minimal catheterization, October 2008   Cellulitis    Chest pain    with stress   Childhood asthma    Chronic low back pain    Constipation    and diarrhea chronic..Dr. Barnet Pall   Depression    Diverticulosis of colon (without mention of hemorrhage)    DVT (deep venous thrombosis) (HCC) 1990s   LLE   Family history of adverse reaction to anesthesia    "daughter died having her 1st child cause she got too much anesthesia"    GERD (gastroesophageal reflux disease)    History of blood transfusion 01/2008   "related to hip OR"   Homocystinemia    signif elevation in the past...plan  folic acid, B6, B12   Hypopotassemia    Hypotension, unspecified    Leg pain    Lymphoproliferative disease (HCC)    disorder in the past??   Methadone adverse reaction    for chronic leg and back pain   Other pulmonary embolism and infarction 2008   Significant 2008 and coumadin therapy RV dysfunction...echo...2008..EF 50%..right ventricle markedly dilated w marked right ventricular dysfunc and moder tricuspid regurg/echo..March 2010, Ef 50%, mild dilation of right ventricule w mild decrease right ventric function    Peripheral vascular disease (HCC)    Rectus sheath hematoma 07/13/2012   On coumadin    Right bundle branch block    intermittent   Thyroid disease    Tobacco use disorder    Urinary incontinence    Venous insufficiency (chronic) (peripheral)    Patient's legs were wrapped 2013    PAST SURGICAL HISTORY   Past Surgical History:  Procedure Laterality Date   CARDIAC CATHETERIZATION  05/2007   Hattie Perch 12/08/2010   CHOLECYSTECTOMY OPEN     COLONOSCOPY WITH PROPOFOL N/A 08/11/2017   Procedure: COLONOSCOPY WITH PROPOFOL;  Surgeon: Sherrilyn Rist, MD;  Location: [facility] ENDOSCOPY;  Service: Gastroenterology;  Laterality: N/A;   COLONOSCOPY WITH PROPOFOL N/A 06/16/2022   Procedure: COLONOSCOPY WITH PROPOFOL;  Surgeon: Sherrilyn Rist, MD;  Location: [facility] ENDOSCOPY;  Service: Gastroenterology;  Laterality: N/A;   GASTRIC BYPASS  1980s   JOINT REPLACEMENT     KNEE ARTHROSCOPY Left 05/2001   /notes 12/21/2010   POLYPECTOMY  06/16/2022   Procedure: POLYPECTOMY;  Surgeon: Sherrilyn Rist, MD;  Location: [facility] ENDOSCOPY;  Service: Gastroenterology;;   SHOULDER ARTHROSCOPY W/ ROTATOR CUFF REPAIR Right 08/2002   Hattie Perch 12/21/2010   TONSILLECTOMY     TOTAL HIP ARTHROPLASTY Right 01/2008    MEDICATIONS/ALLERGIES   Current Meds  Medication Sig   bisacodyl (DULCOLAX) 5 MG EC tablet Take 30 mg by mouth daily as needed for moderate constipation.   HYDROmorphone (DILAUDID) 4 MG tablet  Take 4-8 mg by mouth every 6 (six) hours as needed (breakthrough pain).   methadone (DOLOPHINE) 10 MG tablet Take 20 mg by mouth every 8 (eight) hours.  for pain   naloxone (NARCAN) nasal spray 4 mg/0.1 mL Place 1 spray into the nose once as needed (opioid overdose).   Skin Protectants, Misc. (INTERDRY 10"X36") SHEE UAD   []  levothyroxine (SYNTHROID) 200 MCG tablet TAKE 1 TABLET BY MOUTH EVERY DAY BEFORE BREAKFAST   []  levothyroxine (SYNTHROID) 25 MCG tablet Take 25 mcg by mouth daily before breakfast. For total 225 mcg daily   []  warfarin (COUMADIN) 5 MG tablet TAKE 1 DAILY OR AS DIRECTED BY ANTICOAGULATION CLINIC    Allergies  Allergen Reactions   Amitiza [Lubiprostone] Diarrhea and Nausea And Vomiting   Morphine Anaphylaxis    Nausea, rash, itching, vomiting   Effexor [Venlafaxine] Other (See Comments)    Crying Spells    Nsaids     Bowel irregularity (coffee grounds)    SOCIAL HISTORY/FAMILY HISTORY   Reviewed in Epic:   Social History   Tobacco Use   Smoking status: Former    Packs/day: 0.10    Years: 27.00    Additional pack years: 0.00    Total pack years: 2.70    Types: Cigarettes    Quit date: 06/09/2011    Years since quitting: 11.6   Smokeless tobacco: Never  Vaping Use   Vaping Use: Never used  Substance Use Topics   Alcohol use: No    Alcohol/week: 0.0 standard drinks of alcohol    Comment: 07/14/2016 "nothing since the early 1990s"   Drug use: No   Social History   Social History Narrative   Current smoker wi last 12 mos.   Walks with a walker.   Family History  Problem Relation Age of Onset   Heart disease Mother    Heart disease Father    Crohn's disease Sister    Osteoporosis Sister        brittle bones   Lung cancer Maternal Grandfather    Other Paternal Grandfather        broken heart   Other Daughter        drug overdose    OBJCTIVE -PE, EKG, labs   Wt Readings from Last 3 Encounters:  01/09/23 (!) 324 lb (147 kg)  12/02/22 (!) 323  lb (146.5 kg)  10/18/22 300 lb (136.1 kg)    Physical Exam: BP 120/80 (BP Location: Left Arm, Patient Position: Sitting, Cuff Size: Large)   Pulse 61   Ht 5\' 5"  (1.651 m)   Wt (!) 324 lb (147 kg)   SpO2 96%   BMI 53.92 kg/m  Physical Exam Vitals reviewed.  Constitutional:      General: She is not in acute distress.    Appearance: Normal appearance. She is obese. She is ill-appearing (Somewhat chronically ill-appearing.  Very slow unsteady gait with walker, bending forward.  Very stiff.  Morbidly obese.).  HENT:     Head: Normocephalic and atraumatic.  Neck:     Vascular: No carotid bruit or JVD (Pretty much unable to assess.).  Cardiovascular:     Rate and Rhythm: Normal rate and regular rhythm. Occasional Extrasystoles are present.    Chest Wall: PMI is not displaced (Unable to assess).     Pulses: Decreased pulses (Diminished due to body habitus, but feet are warm).     Heart sounds: S1 normal and S2 normal. Heart sounds are distant. No murmur heard.    No friction rub. No gallop.  Pulmonary:     Effort: Pulmonary effort is normal. No respiratory distress.     Breath sounds: No wheezing.  Comments: Distant lung sounds.-Basal crackles that clear with cough. Abdominal:     General: Bowel sounds are normal. There is no distension.     Palpations: Abdomen is soft. There is no mass.     Tenderness: There is abdominal tenderness (Left sided lower quadrant tenderness). There is no rebound.     Hernia: No hernia is present.     Comments: Obese  Musculoskeletal:        General: Swelling (2-3+ pitting edema bilaterally.  Associate with varicose veins and spider veins.) present.     Cervical back: Normal range of motion and neck supple.     Right lower leg: Edema (2-3+) present.     Left lower leg: Edema (2-3+) present.  Skin:    General: Skin is warm and dry.     Findings: Erythema (Diffuse bilateral lower extremity venous stasis changes with orange peel configuration of skin  and sloughing skin. ->  Pinkish/violaceous skin discoloration.  Spider veins and varicose veins noted.) present.  Neurological:     General: No focal deficit present.     Mental Status: She is alert and oriented to person, place, and time.     Gait: Gait abnormal (Walks leaning forward very stiff, slow, deliberate with walker.).  Psychiatric:        Mood and Affect: Mood normal.        Behavior: Behavior normal.        Thought Content: Thought content normal.        Judgment: Judgment normal.     Adult ECG Report  Rate: 61;  Rhythm: normal sinus rhythm, premature ventricular contractions (PVC), and (difficult to tell because of artifact); low voltage. ;   Narrative Interpretation: Stable  Recent Labs: Reviewed Lab Results  Component Value Date   CHOL 136 12/02/2022   HDL 32.60 (L) 12/02/2022   LDLCALC 76 12/02/2022   LDLDIRECT 105.0 05/24/2019   TRIG 136.0 12/02/2022   CHOLHDL 4 12/02/2022   Lab Results  Component Value Date   CREATININE 0.65 12/02/2022   BUN 10 12/02/2022   NA 136 12/02/2022   K 3.7 12/02/2022   CL 98 12/02/2022   CO2 32 12/02/2022      Latest Ref Rng & Units 12/02/2022    2:22 PM 04/01/2022    3:15 PM 10/12/2021   11:35 AM  CBC  WBC 4.0 - 10.5 K/uL 5.1  4.9  4.3   Hemoglobin 12.0 - 15.0 g/dL 16.1  09.6  04.5   Hematocrit 36.0 - 46.0 % 40.2  39.0  38.9   Platelets 150.0 - 400.0 K/uL 185.0  169.0  153.0     Lab Results  Component Value Date   HGBA1C 5.6 12/02/2022   Lab Results  Component Value Date   TSH 0.81 12/02/2022    ================================================== I spent a total of 25 minutes with the patient spent in direct patient consultation.  Additional time spent with chart review  / charting (studies, outside notes, etc): 36 min Total Time: 61 min  Current medicines are reviewed at length with the patient today.  (+/- concerns) N/A. 5.  Notice: This dictation was prepared with Dragon dictation along with smart phrase  technology. Any transcriptional errors that result from this process are unintentional and may not be corrected upon review.   Studies Ordered:  Orders Placed This Encounter  Procedures   EKG 12-Lead   ECHOCARDIOGRAM COMPLETE   No orders of the defined types were placed in this encounter.   Patient  Instructions / Medication Changes & Studies & Tests Ordered   Patient Instructions  Medication Instructions:  No changes  *If you need a refill on your cardiac medications before your next appointment, please call your pharmacy*   Lab Work: Not needed    Testing/Procedures: Will be schedule at [address] Your physician has requested that you have an echocardiogram. Echocardiography is a painless test that uses sound waves to create images of your heart. It provides your doctor with information about the size and shape of your heart and how well your heart's chambers and valves are working. This procedure takes approximately one hour. There are no restrictions for this procedure. Please do NOT wear cologne, perfume, aftershave, or lotions (deodorant is allowed). Please arrive 15 minutes prior to your appointment time.    Follow-Up: At [facility], you and your health needs are our priority.  As part of our continuing mission to provide you with exceptional heart care, we have created designated Provider Care Teams.  These Care Teams include your primary Cardiologist (physician) and Advanced Practice Providers (APPs -  Physician Assistants and Nurse Practitioners) who all work together to provide you with the care you need, when you need it.     Your next appointment:    2 to 3 month(s)  The format for your next appointment:   In Person  Provider:   Bryan Lemma, MD    Other Instructions  KardiaMobile [email or web address]        Marykay Lex, MD, MS Bryan Lemma, M.D., M.S. Interventional Cardiologist   [facility]  Pager # [phone] Phone # [phone] [address] [place], [place] [address]   Thank you for choosing [facility] [place]!!

## 2023-01-10 ENCOUNTER — Ambulatory Visit (INDEPENDENT_AMBULATORY_CARE_PROVIDER_SITE_OTHER): Payer: Medicare HMO | Admitting: Obstetrics and Gynecology

## 2023-01-10 ENCOUNTER — Encounter: Payer: Self-pay | Admitting: Obstetrics and Gynecology

## 2023-01-10 VITALS — BP 132/83 | HR 59

## 2023-01-10 DIAGNOSIS — R6889 Other general symptoms and signs: Secondary | ICD-10-CM | POA: Diagnosis not present

## 2023-01-10 DIAGNOSIS — N390 Urinary tract infection, site not specified: Secondary | ICD-10-CM

## 2023-01-10 DIAGNOSIS — N3281 Overactive bladder: Secondary | ICD-10-CM | POA: Diagnosis not present

## 2023-01-10 DIAGNOSIS — R351 Nocturia: Secondary | ICD-10-CM

## 2023-01-10 MED ORDER — CEPHALEXIN 250 MG PO CAPS: 250.0000 mg | ORAL_CAPSULE | Freq: Every day | ORAL | 5 refills | Status: DC

## 2023-01-10 NOTE — Patient Instructions (Signed)
Start Gemtesa 75mg  daily (take it at night), do the estrogen cream twice a week, and continue on the low dose antibioitc for maintenance.   Call me in a few weeks and let me know how you like the Gemtesa and I will send in a prescription if you want to continue.

## 2023-01-10 NOTE — Progress Notes (Signed)
[facility] Return Visit  SUBJECTIVE  History of Present Illness: [name] is a 67 y.o. female seen in follow-up for OAB and Recurrent UTI. Plan at last visit was be on prophylactic antibiotics for UTI prevention.   She reports she had one instance in which she thought she might have a UTI but her culture with PCP came back negative.   Is still urinating multiple times at night which is bothersome to her sleep. Has previously tried Myrbetriq which caused her to have intense pelvic pressure.   Past Medical History: Patient  has a past medical history of Anemia, unspecified, Anxiety, Arthritis, Benign neoplasm of colon (12/2007), CAD (coronary artery disease), Cellulitis, Chest pain, Childhood asthma, Chronic low back pain, Constipation, Depression, Diverticulosis of colon (without mention of hemorrhage), DVT (deep venous thrombosis) (HCC) (1990s), Family history of adverse reaction to anesthesia, GERD (gastroesophageal reflux disease), History of blood transfusion (01/2008), Homocystinemia, Hypopotassemia, Hypotension, unspecified, Leg pain, Lymphoproliferative disease (HCC), Methadone adverse reaction, Other pulmonary embolism and infarction (2008), Peripheral vascular disease (HCC), Rectus sheath hematoma (07/13/2012), Right bundle branch block, Thyroid disease, Tobacco use disorder, Urinary incontinence, and Venous insufficiency (chronic) (peripheral).   Past Surgical History: She  has a past surgical history that includes Gastric bypass (1980s); Tonsillectomy; Cholecystectomy open; Joint replacement; Total hip arthroplasty (Right, 01/2008); Shoulder arthroscopy w/ rotator cuff repair (Right, 08/2002); Knee arthroscopy (Left, 05/2001); Cardiac catheterization (05/2007); Colonoscopy with propofol (N/A, 08/11/2017); Colonoscopy with propofol (N/A, 06/16/2022); and polypectomy (06/16/2022).   Medications: She has a current medication list which includes the following prescription(s):  bisacodyl, calcium carbonate, estradiol, hydromorphone, levothyroxine, levothyroxine, methadone, naloxone, nystatin-triamcinolone ointment, rabeprazole, interdry 10"x36", cyanocobalamin, warfarin, cephalexin, and dry eye relief drops.   Allergies: Patient is allergic to amitiza [lubiprostone], morphine, effexor [venlafaxine], and nsaids.   Social History: Patient  reports that she quit smoking about 11 years ago. Her smoking use included cigarettes. She has a 2.70 pack-year smoking history. She has never used smokeless tobacco. She reports that she does not drink alcohol and does not use drugs.      OBJECTIVE     Physical Exam: Vitals:   01/10/23 1259  BP: 132/83  Pulse: (!) 59   Gen: No apparent distress, A&O x 3.  Detailed Urogynecologic Evaluation:  Deferred. Prior exam showed:      No data to display             ASSESSMENT AND PLAN    [name] is a 66 y.o. with:  1. Nocturia   2. Recurrent urinary tract infection   3. OAB (overactive bladder)    Patient is getting up multiple times to urinate at night. Will start Gemtesa 75mg  daily and see if this is helpful to her.  Patient would like to continue on the antibiotics for another course. She feels it has done her well. Will continue low dose keflex for another 6 months and then re-evaluate stopping the prophylaxis and switching to potentially Hiprex.  Start Gemtesa 75mg  for OAB symptoms.   Patient to follow up in 6 months or sooner if needed.

## 2023-01-11 ENCOUNTER — Ambulatory Visit (INDEPENDENT_AMBULATORY_CARE_PROVIDER_SITE_OTHER): Payer: Medicare HMO

## 2023-01-11 ENCOUNTER — Encounter: Payer: Self-pay | Admitting: Internal Medicine

## 2023-01-11 DIAGNOSIS — Z86718 Personal history of other venous thrombosis and embolism: Secondary | ICD-10-CM | POA: Diagnosis not present

## 2023-01-11 DIAGNOSIS — Z7901 Long term (current) use of anticoagulants: Secondary | ICD-10-CM | POA: Diagnosis not present

## 2023-01-11 LAB — POCT INR: INR: 1.9 — AB (ref 2.0–3.0)

## 2023-01-11 MED ORDER — WARFARIN SODIUM 5 MG PO TABS
ORAL_TABLET | ORAL | 1 refills | Status: DC
Start: 2023-01-11 — End: 2023-04-12

## 2023-01-11 NOTE — Progress Notes (Signed)
I have reviewed and agree with note, evaluation, plan.   Icey Tello, MD  

## 2023-01-11 NOTE — Addendum Note (Signed)
Addended by: Sherrie George A on: 01/11/2023 04:29 PM   Modules accepted: Orders

## 2023-01-11 NOTE — Patient Instructions (Addendum)
Pre visit review using our clinic review tool, if applicable. No additional management support is needed unless otherwise documented below in the visit note.  Increase dose today to take 1 1/2 tablets and then change weekly dose to take 1 tablet daily except take 1 1/2 tablets on Wednesdays. Recheck in 2 week, on 6/19.

## 2023-01-11 NOTE — Progress Notes (Addendum)
Pt returned call to report she was prescribed Gemtesa. No interaction with warfarin.  Checks INR at home, received result of 1.9 from [employer] portal today. Increase dose today to take 1 1/2 tablets and then change weekly dose to take 1 tablet daily except take 1 1/2 tablets on Wednesdays. Recheck in 2 week, on 6/19.  Pt returned call to report she was prescribed Gemtesa. No interaction with warfarin.  Pt requested refill of warfarin. Sent in refill

## 2023-01-13 ENCOUNTER — Encounter: Payer: Self-pay | Admitting: Cardiology

## 2023-01-13 NOTE — Assessment & Plan Note (Signed)
Exertional dyspnea is probably baseline for her.  She is morbidly obese, very deconditioned and immobilized.  It is plenty of effort for her to do to walk because of her hip back and knee pain.  Will check a 2D echo just to ensure that her left ventricular function is normal given the PVCs on EKG.  Would also like to evaluate the RV size and pressures.  Would not want to consider further testing until we see the results of the echocardiogram.  If there is a reduced EF or regional wall motion abnormality,, we would potentially consider ischemic evaluation.

## 2023-01-13 NOTE — Assessment & Plan Note (Signed)
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  Has been followed by warfarin clinic.  Perhaps the choice was warfarin over DOAC because of overall cost.  Will defer management to warfarin clinic and PCP.

## 2023-01-13 NOTE — Assessment & Plan Note (Signed)
Nocturnal leg cramps are probably related to venous stasis.  Not sure how much she would benefit from compression stockings, but would defer to vein and vascular if there are any other options.  Consider lymphedema pumps.

## 2023-01-13 NOTE — Assessment & Plan Note (Signed)
Somewhat concerning that she falls being on warfarin.  I think she definitely needs to use the walker.

## 2023-01-13 NOTE — Assessment & Plan Note (Signed)
Extensive PEs back in January 2019, now on lifelong warfarin.  No real bleeding issues.  This is followed by her PCP and warfarin clinic  As far as I can tell, no recurrent PEs.  Will check a 2D echo just to see if there is any evidence of pulm hypertension from recurrent PEs and obesity.

## 2023-01-13 NOTE — Assessment & Plan Note (Signed)
LDL was 76.  Relatively well-controlled in the absence of any active cardiac risk factors.  She is not medications.

## 2023-01-13 NOTE — Assessment & Plan Note (Signed)
Question if she would benefit from lymphedema pump

## 2023-01-13 NOTE — Assessment & Plan Note (Signed)
Longstanding issue.  Certainly not helping her walking.  She may actually be on maintenance dose of Keflex that was listed.  Was unsure if she is taking or not.  Stressed importance of foot elevation and I wonder if she would benefit from wound care for [employer]'s or even lymphedema pumps.  She had been seen by vein and vascular back in 2012.  Not sure that there is much that can be done with deep and superficial venous reflux-from a mechanical standpoint other than compression and foot elevation.

## 2023-01-13 NOTE — Assessment & Plan Note (Signed)
She does have these palpitation episodes but did not happen within a frequency that we do actually capture them on the monitor.  I think at this point my recommendation would be for her to do a self monitoring technique with a portable monitor symptoms [employer].  We have provided her information for that device.

## 2023-01-25 ENCOUNTER — Ambulatory Visit (INDEPENDENT_AMBULATORY_CARE_PROVIDER_SITE_OTHER): Payer: Medicare HMO

## 2023-01-25 DIAGNOSIS — Z7901 Long term (current) use of anticoagulants: Secondary | ICD-10-CM

## 2023-01-25 LAB — POCT INR: INR: 1.9 — AB (ref 2.0–3.0)

## 2023-01-25 NOTE — Progress Notes (Signed)
Pt ran out of levothyroxine for several days. Pt is now taking medication again.  Increase dose today to take 2 tablets and then change weekly dose to take 1 tablet daily except take 1 1/2 tablets on Sundays and Wednesdays. Recheck in 2 week, on 7/2. LVM with detailed instructions.

## 2023-01-25 NOTE — Patient Instructions (Addendum)
Pre visit review using our clinic review tool, if applicable. No additional management support is needed unless otherwise documented below in the visit note.  Increase dose today to take 2 tablets and then change weekly dose to take 1 tablet daily except take 1 1/2 tablets on Sundays and Wednesdays. Recheck in 2 week, on 7/2.

## 2023-01-25 NOTE — Progress Notes (Signed)
I have reviewed and agree with note, evaluation, plan.   Kessie Croston, MD  

## 2023-01-31 ENCOUNTER — Telehealth (HOSPITAL_COMMUNITY): Payer: Self-pay | Admitting: Cardiology

## 2023-01-31 NOTE — Telephone Encounter (Signed)
Patient called and cancelled echocardiogram for the reason below:   01/30/2023 4:45 PM [id number], [name]  Cancel Rsn: Conflict/Need to Reschedule (per pt she cant make it to this date and will callback to r/[name])   Order will be removed from the active echo wq. If patient calls back we will reinstate the orders. Thank you.

## 2023-02-01 NOTE — Telephone Encounter (Signed)
That's fine  DH 

## 2023-02-04 ENCOUNTER — Encounter: Payer: Self-pay | Admitting: Obstetrics and Gynecology

## 2023-02-07 ENCOUNTER — Ambulatory Visit (INDEPENDENT_AMBULATORY_CARE_PROVIDER_SITE_OTHER): Payer: Medicare HMO

## 2023-02-07 DIAGNOSIS — Z7901 Long term (current) use of anticoagulants: Secondary | ICD-10-CM

## 2023-02-07 LAB — POCT INR: INR: 2.7 (ref 2.0–3.0)

## 2023-02-07 NOTE — Patient Instructions (Addendum)
Pre visit review using our clinic review tool, if applicable. No additional management support is needed unless otherwise documented below in the visit note.  Continue 1 tablet daily except take 1 1/2 tablets on Sundays and Wednesdays. Recheck in 2 week, on 7/17.

## 2023-02-07 NOTE — Progress Notes (Signed)
Checks INR at home, received result of 2.7 from [employer] portal today. Continue 1 tablet daily except take 1 1/2 tablets on Sundays and Wednesdays. Recheck in 2 week, on 7/17. Contacted pt and advised of dosing and recheck. Pt verbalized understanding.

## 2023-02-08 MED ORDER — VIBEGRON 75 MG PO TABS: 75.0000 mg | ORAL_TABLET | Freq: Every day | ORAL | 2 refills | Status: DC

## 2023-02-10 ENCOUNTER — Ambulatory Visit (HOSPITAL_COMMUNITY): Payer: Medicare HMO

## 2023-02-19 ENCOUNTER — Encounter: Payer: Self-pay | Admitting: Internal Medicine

## 2023-02-20 ENCOUNTER — Encounter: Payer: Self-pay | Admitting: Internal Medicine

## 2023-02-21 ENCOUNTER — Ambulatory Visit (INDEPENDENT_AMBULATORY_CARE_PROVIDER_SITE_OTHER): Payer: Medicare HMO

## 2023-02-21 DIAGNOSIS — Z7901 Long term (current) use of anticoagulants: Secondary | ICD-10-CM

## 2023-02-21 LAB — POCT INR: INR: 2.1 (ref 2.0–3.0)

## 2023-02-21 NOTE — Progress Notes (Signed)
Checks INR at home, received result of 2.1 from [employer] portal today. Continue 1 tablet daily except take 1 1/2 tablets on Sundays and Wednesdays. Recheck in 2 week, on 7/30. Contacted pt and advised of dosing and recheck. Pt verbalized understanding.

## 2023-02-21 NOTE — Patient Instructions (Addendum)
Pre visit review using our clinic review tool, if applicable. No additional management support is needed unless otherwise documented below in the visit note.  Continue 1 tablet daily except take 1 1/2 tablets on Sundays and Wednesdays. Recheck in 2 week, on 7/30.

## 2023-03-06 ENCOUNTER — Encounter: Payer: Self-pay | Admitting: Internal Medicine

## 2023-03-06 ENCOUNTER — Other Ambulatory Visit: Payer: Self-pay | Admitting: Obstetrics and Gynecology

## 2023-03-06 DIAGNOSIS — N390 Urinary tract infection, site not specified: Secondary | ICD-10-CM

## 2023-03-07 ENCOUNTER — Ambulatory Visit (INDEPENDENT_AMBULATORY_CARE_PROVIDER_SITE_OTHER): Payer: Medicare HMO

## 2023-03-07 DIAGNOSIS — Z86718 Personal history of other venous thrombosis and embolism: Secondary | ICD-10-CM | POA: Diagnosis not present

## 2023-03-07 DIAGNOSIS — Z7901 Long term (current) use of anticoagulants: Secondary | ICD-10-CM | POA: Diagnosis not present

## 2023-03-07 LAB — POCT INR: INR: 1.8 — AB (ref 2.0–3.0)

## 2023-03-07 NOTE — Progress Notes (Addendum)
Checks INR at home, received result of 1.8 from [employer] portal today. Urology prescribed Keflex. Increase dose today to take 1 1/2 tablets and then continue 1 tablet daily except take 1 1/2 tablets on Sundays and Wednesdays. Recheck in 2 week, on 8/13. Advised pt of dosing and recheck date. Pt denies any changes. Pt verbalized understanding

## 2023-03-07 NOTE — Patient Instructions (Addendum)
Pre visit review using our clinic review tool, if applicable. No additional management support is needed unless otherwise documented below in the visit note.  Increase dose today to take 1 1/2 tablets and then continue 1 tablet daily except take 1 1/2 tablets on Sundays and Wednesdays. Recheck in 2 week, on 8/13.

## 2023-03-14 ENCOUNTER — Other Ambulatory Visit (HOSPITAL_COMMUNITY): Payer: Self-pay

## 2023-03-14 MED ORDER — HYDROMORPHONE HCL 4 MG PO TABS
4.0000 mg | ORAL_TABLET | Freq: Four times a day (QID) | ORAL | 0 refills | Status: DC | PRN
Start: 2023-03-14 — End: 2023-06-06
  Filled 2023-03-14: qty 120, 15d supply, fill #0

## 2023-03-20 ENCOUNTER — Other Ambulatory Visit (HOSPITAL_COMMUNITY): Payer: Self-pay

## 2023-03-20 MED ORDER — METHADONE HCL 10 MG PO TABS
10.0000 mg | ORAL_TABLET | Freq: Three times a day (TID) | ORAL | 0 refills | Status: DC | PRN
Start: 2023-02-03 — End: 2023-06-06
  Filled 2023-03-20: qty 180, 30d supply, fill #0

## 2023-03-21 ENCOUNTER — Other Ambulatory Visit (HOSPITAL_COMMUNITY): Payer: Self-pay

## 2023-03-21 LAB — POCT INR: INR: 2.8 (ref 2.0–3.0)

## 2023-03-23 ENCOUNTER — Other Ambulatory Visit (HOSPITAL_COMMUNITY): Payer: Self-pay

## 2023-03-23 ENCOUNTER — Encounter: Payer: Self-pay | Admitting: Gastroenterology

## 2023-03-23 ENCOUNTER — Ambulatory Visit (INDEPENDENT_AMBULATORY_CARE_PROVIDER_SITE_OTHER): Payer: Medicare HMO

## 2023-03-23 DIAGNOSIS — Z7901 Long term (current) use of anticoagulants: Secondary | ICD-10-CM

## 2023-03-23 MED ORDER — HYOSCYAMINE SULFATE 0.125 MG PO TABS
ORAL_TABLET | ORAL | 0 refills | Status: DC
  Filled 2023-03-23: qty 15, 5d supply, fill #0

## 2023-03-23 NOTE — Telephone Encounter (Signed)
Not clear this reported pain is from hiatal hernia.  This degree of pain would not be expected from a hiatal hernia, and there is also no reported hiatal hernia on her CT abdomen from May of this year and no prior upper endoscopic report in her chart.  Perhaps it is some intestinal spasm, and she can have a prescription for hyoscyamine 0.125 mg to take 1 3 times a day as needed for intestinal spasm.  Dispense 15 tablets, refill 0  She needs to contact her cardiologist with this information in case they feel cardiac testing is warranted.  HD

## 2023-03-23 NOTE — Progress Notes (Signed)
Checks INR at home, received result of 2.8 from [employer] portal today. Did not receive an email on 8/13 when pt uploaded the result, reporting there is a result in the portal. So result was retrieved when reviewing the website. Continue 1 tablet daily except take 1 1/2 tablets on Sundays and Wednesdays. Recheck in 2 week, on 8/27. Advised pt of dosing and recheck date. Pt denies any changes. Pt verbalized understanding.

## 2023-03-23 NOTE — Patient Instructions (Addendum)
Pre visit review using our clinic review tool, if applicable. No additional management support is needed unless otherwise documented below in the visit note.  Continue 1 tablet daily except take 1 1/2 tablets on Sundays and Wednesdays. Recheck in 2 week, on 8/29.

## 2023-03-28 ENCOUNTER — Ambulatory Visit: Payer: Medicare HMO | Admitting: Cardiology

## 2023-03-29 ENCOUNTER — Other Ambulatory Visit (HOSPITAL_COMMUNITY): Payer: Self-pay

## 2023-04-04 ENCOUNTER — Ambulatory Visit: Payer: Self-pay

## 2023-04-04 ENCOUNTER — Other Ambulatory Visit (HOSPITAL_COMMUNITY): Payer: Self-pay

## 2023-04-04 DIAGNOSIS — Z7901 Long term (current) use of anticoagulants: Secondary | ICD-10-CM | POA: Diagnosis not present

## 2023-04-04 LAB — POCT INR: INR: 2.7 (ref 2.0–3.0)

## 2023-04-04 MED ORDER — HYDROMORPHONE HCL 4 MG PO TABS
4.0000 mg | ORAL_TABLET | Freq: Four times a day (QID) | ORAL | 0 refills | Status: DC
  Filled 2023-04-17: qty 90, 12d supply, fill #0
  Filled 2023-04-17: qty 30, 3d supply, fill #0
  Filled 2023-04-17: qty 90, 12d supply, fill #0

## 2023-04-04 NOTE — Patient Instructions (Addendum)
Pre visit review using our clinic review tool, if applicable. No additional management support is needed unless otherwise documented below in the visit note.  Continue 1 tablet daily except take 1 1/2 tablets on Sundays and Wednesdays. Recheck in 2 week, on 9/10.

## 2023-04-04 NOTE — Progress Notes (Signed)
Checks INR at home, received result of 2.7 from [employer] portal today. Continue 1 tablet daily except take 1 1/2 tablets on Sundays and Wednesdays. Recheck in 2 week, on 9/10. LVM with instructions.

## 2023-04-05 ENCOUNTER — Ambulatory Visit (INDEPENDENT_AMBULATORY_CARE_PROVIDER_SITE_OTHER): Payer: Medicare HMO | Admitting: Gastroenterology

## 2023-04-05 ENCOUNTER — Encounter: Payer: Self-pay | Admitting: Gastroenterology

## 2023-04-05 VITALS — BP 110/60 | HR 71 | Ht 65.0 in | Wt 311.4 lb

## 2023-04-05 DIAGNOSIS — R140 Abdominal distension (gaseous): Secondary | ICD-10-CM

## 2023-04-05 DIAGNOSIS — R1084 Generalized abdominal pain: Secondary | ICD-10-CM | POA: Diagnosis not present

## 2023-04-05 DIAGNOSIS — K5909 Other constipation: Secondary | ICD-10-CM

## 2023-04-05 NOTE — Progress Notes (Signed)
[facility] Progress Note  Chief Complaint: Chief Complaint  Patient presents with   Abdominal Pain    A hernia in her abd area. Said something is going on   Constipation    [name] she takes her dulcolax and waited 24 hours and took 5 more on Sunday and finally today had 6 little balls. Wants to know if she needs to change her dulcolax    Subjective  History: [name] was last seen for a surveillance colonoscopy in November 2023, done at [facility].  Complete exam, good prep, diminutive tubular adenoma removed, 5-year recall recommended. She has opioid-induced constipation, and after the last colonoscopy a trial of Movantik was recommended.  Unfortunately, it was cost prohibitive. She contacted our office in May of this year because after experiencing lower abdominal pain she saw primary care, and a CT scan showed diverticulitis treated with antibiotics. ______________________  Today, she complains of ongoing abdominal pain that is fairly generalized but primarily upper.  Upper abdomen feels bloated and firm and she is concerned there may be a hemorrhoid.  She also states that she had a vomiting episode last night after eating few bites of pasta.  Last few weeks has some early satiety, and she also reports shortness of breath shortly after eating thus she has been eating small amounts food this past 2 weeks.   She also complains of chronic constipation. She is typically has 2 BM a week. Her most recent BM was earlier today and describes her stool as pallet like. She has tried Linzess previously (reportedly prescribed by her pain medicine physician) and she had experienced headaches and sweating with.  At this point she feels ready to try any other medicines that might help relieve her constipation, even if they have undesirable side effects such as she experience with Linzess.  She denies any blood in stool but states that she notices blood after having a BM due to constipation  and hard stool. She is taking multiple Dulcolax pills without much relief lately. She was unable to start movantik due to insurance issues.  Patient denies diarrhea, nausea, blood in stool, black stool, unintentional weight loss, reflux, dysphagia.  ROS: Review of Systems  Constitutional:  Negative for appetite change and fever.  HENT:  Negative for trouble swallowing.   Respiratory:  Negative for cough and shortness of breath.   Cardiovascular:  Negative for chest pain.  Gastrointestinal:  Positive for abdominal distention, anal bleeding, diarrhea and vomiting. Negative for abdominal pain, blood in stool, constipation, nausea and rectal pain.  Genitourinary:  Negative for dysuria.  Musculoskeletal:  Negative for back pain.  Skin:  Negative for rash.  Neurological:  Negative for weakness.  All other systems reviewed and are negative.    The patient's Past Medical, Family and Social History were reviewed and are on file in the EMR.  Objective:  Med list reviewed  Current Outpatient Medications:    bisacodyl (DULCOLAX) 5 MG EC tablet, Take 30 mg by mouth daily as needed for moderate constipation., Disp: , Rfl:    calcium carbonate (OS-CAL) 1250 (500 Ca) MG chewable tablet, Chew by mouth., Disp: , Rfl:    cephALEXin (KEFLEX) 250 MG capsule, Take 1 capsule (250 mg total) by mouth daily., Disp: 30 capsule, Rfl: 5   estradiol (ESTRACE) 0.1 MG/GM vaginal cream, Place 0.5g nightly for two weeks then twice a week after, Disp: 30 g, Rfl: 11   Glycerin-Hypromellose-PEG 400 (DRY EYE RELIEF DROPS) 0.2-0.2-1 % SOLN,  Place 1 drop into both eyes as needed (dry eyes)., Disp: , Rfl:    HYDROmorphone (DILAUDID) 4 MG tablet, Take 1-2 tablets (4-8 mg total) by mouth every 6 (six) hours as needed for breakthrough pain., Disp: 120 tablet, Rfl: 0   [START ON 04/14/2023] HYDROmorphone (DILAUDID) 4 MG tablet, Take 1-2 tablets (4-8 mg total) by mouth every 6 (six) hours as needed for breakthrough pain, Disp: 120  tablet, Rfl: 0   hyoscyamine (LEVSIN) 0.125 MG tablet, Take 1 tablet by mouth 1-3 times a day as needed for intestinal spasms, Disp: 15 tablet, Rfl: 0   levothyroxine (SYNTHROID) 200 MCG tablet, TAKE 1 TABLET BY MOUTH EVERY DAY BEFORE BREAKFAST, Disp: 90 tablet, Rfl: 1   levothyroxine (SYNTHROID) 25 MCG tablet, Take 1 tablet (25 mcg total) by mouth daily before breakfast. For total 225 mcg daily, Disp: 90 tablet, Rfl: 1   methadone (DOLOPHINE) 10 MG tablet, Take 1 - 2 tablets (10 - 20 mg total) by mouth every 8 hours as needed for pain., Disp: 180 tablet, Rfl: 0   naloxone (NARCAN) nasal spray 4 mg/0.1 mL, Place 1 spray into the nose once as needed (opioid overdose)., Disp: , Rfl:    nystatin-triamcinolone ointment (MYCOLOG), Apply 1 Application topically 2 (two) times daily as needed (irritation)., Disp: 120 g, Rfl: 5   RABEprazole (ACIPHEX) 20 MG tablet, Take 20 mg by mouth daily as needed (acid reflux)., Disp: , Rfl:    Skin Protectants, Misc. (INTERDRY 10"X36") SHEE, UAD, Disp: 30 each, Rfl: 5   Vibegron 75 MG TABS, Take 1 tablet (75 mg total) by mouth daily., Disp: 90 tablet, Rfl: 2   vitamin B-12 (CYANOCOBALAMIN) 1000 MCG tablet, Take 1 tablet (1,000 mcg total) by mouth daily., Disp: , Rfl:    warfarin (COUMADIN) 5 MG tablet, TAKE 1 TABLET BY MOUTH DAILY EXCEPT TAKE 1 1/2 TABLETS ON WEDNESDAYS OR AS DIRECTED BY ANTICOAGULATION CLINIC, Disp: 110 tablet, Rfl: 1   Vital signs in last 24 hrs: Vitals:   04/05/23 1457  BP: 110/60  Pulse: 71   Wt Readings from Last 3 Encounters:  04/05/23 (!) 311 lb 6 oz (141.2 kg)  01/09/23 (!) 324 lb (147 kg)  12/02/22 (!) 323 lb (146.5 kg)    Physical Exam  General: well-appearing and conversational.  In a wheelchair, able to stand up briefly Eyes: sclera anicteric, no redness ENT: oral mucosa moist without lesions, no cervical or supraclavicular lymphadenopathy CV: RRR, no JVD, no peripheral edema Resp: clear to auscultation bilaterally, normal RR  and effort noted GI: Morbidly obese, upper abdomen protuberant and firm, no tenderness, with active bowel sounds.  Unable to appreciate a paraspinal megaly due to body habitus. Skin; warm and dry, no rash or jaundice noted   Labs:   ___________________________________________ Radiologic studies: CLINICAL DATA:  Left lower quadrant abdominal pain.   EXAM: CT ABDOMEN AND PELVIS WITH CONTRAST   TECHNIQUE: Multidetector CT imaging of the abdomen and pelvis was performed using the standard protocol following bolus administration of intravenous contrast.   RADIATION DOSE REDUCTION: This exam was performed according to the departmental dose-optimization program which includes automated exposure control, adjustment of the mA and/or kV according to patient size and/or use of iterative reconstruction technique.   CONTRAST:  ISOVUE-300 IOPAMIDOL (ISOVUE-300) INJECTION 61%   COMPARISON:  Multiple priors including most recent chest CT September 23, 2022 abdomen and pelvis CT June 23, 2017 and more remote CT abdomen pelvis July 07, 2012   FINDINGS: Lower chest:  No acute abnormality.   Hepatobiliary: No suspicious hepatic lesion. Gallbladder surgically absent. Similar dilation of the extra hepatic biliary tree as well as dilation of the biliary tree in the left lobe of the liver. This is unchanged dating back to 2013.   Pancreas: Fatty pancreatic atrophy. No pancreatic ductal dilation or evidence of acute inflammation.   Spleen: No splenomegaly.   Adrenals/Urinary Tract: Bilateral adrenal glands appear normal. No hydronephrosis. Kidneys demonstrate symmetric enhancement and excretion of contrast material. Urinary bladder is unremarkable for degree of distension within limitation of streak artifact from right hip arthroplasty.   Stomach/Bowel: Radiopaque enteric contrast material traverses mid/distal loops of small bowel. Stomach is minimally distended limiting  evaluation. No pathologic dilation of small or large bowel. Colonic diverticulosis with possible mild sigmoid colonic diverticulitis.   Vascular/Lymphatic: Aortic atherosclerosis. Normal caliber abdominal aorta. Smooth IVC contours. Prominent right-greater-than-left iliac side chain and pelvic sidewall lymph nodes are similar to CT June 23, 2017 for instance a right pelvic sidewall lymph node measuring 13 mm in short axis on image 66/2 is unchanged and a left external iliac lymph node measuring 17 mm in short axis on image 68/2 is also unchanged.   Reproductive: Calcified uterine leiomyoma.   Other: No significant abdominopelvic free fluid. No pneumoperitoneum.   Musculoskeletal: Diffuse demineralization of bone. Similar severe L3 and mild-to-moderate L5 and T12 compression deformities.   IMPRESSION: 1. Colonic diverticulosis with possible mild sigmoid colonic diverticulitis. 2. Prominent right-greater-than-left iliac side chain and pelvic sidewall lymph nodes are similar to CT June 23, 2017 and nonspecific. 3. Similar dilation of the extra hepatic biliary tree as well as dilation of the biliary tree in the left lobe of the liver, similar dating back to 2013. 4. Similar severe L3 and mild-to-moderate L5 and T12 compression deformities. 5.  Aortic Atherosclerosis (ICD10-I70.0).     Electronically Signed   By: Maudry Mayhew M.D.   On: 12/19/2022 09:35    (Images personally reviewed-[name])  ____________________________________________ Other: : Pathology results as above  _____________________________________________ Assessment & Plan  Assessment: Chronic constipation  Generalized abdominal pain  Abdominal bloating  Ongoing opioid-induced constipation, may be other motility issues as well.  I think this is causing the bloating distention and early satiety.  No hernia seen on abdominal CT and May of this year. They will be challenging to treat this if  certain meds are cost prohibitive, but we will do what we can with what we have available.  She had side effects from Linzess in the past, but thought it might be worth trying again if it is at least partially effective.  We had no samples of Linzess today, so instead I have proposed the following: Plan: -Dulcolax suppository x 1, then once a week -Motegrity 2 mg once daily for one week for (7-day sample box was given).  After that is complete, then take -Trulance 3 mg once daily (2 sample boxes given)  Asked her to give [place] an update by phone or portal message in 2 weeks or sooner if needed.  With that, we can decide about prescribing Trulance or Linzess.  Motegrity might be helpful for her, but is typically prohibitively expensive for Medicare patients.    Carmen Jupiter, MD    [facility]  35 minutes were spent on this encounter (including chart review, history/exam, counseling/coordination of care, and documentation) > 50% of that time was spent on counseling and coordination of care.     I,[name],acting as a scribe  for Carmen Pitter III, MD.,have documented all relevant documentation on the behalf of Carmen Rist, MD,as directed by  Carmen Rist, MD while in the presence of Carmen Rist, MD.   Carmen Repress III, MD, have reviewed all documentation for this visit. The documentation on 04/05/23 for the exam, diagnosis, procedures, and orders are all accurate and complete.

## 2023-04-05 NOTE — Progress Notes (Deleted)
[facility] Progress Note  Chief Complaint: Chronic constipation and diverticulitis  Subjective  History: [name] was last seen for a surveillance colonoscopy in November 2023, done at [facility]  Complete exam, good prep, diminutive tubular adenoma removed, 5-year recall recommended. She has opioid-induced constipation, and after the last colonoscopy a trial of Movantik was recommended.  Unfortunately, it was cost prohibitive. She contacted our office in May of this year because after experiencing lower abdominal pain she saw primary care, and a CT scan showed diverticulitis treated with antibiotics. ***  ROS: Cardiovascular:  no chest pain Respiratory: no dyspnea  The patient's Past Medical, Family and Social History were reviewed and are on file in the EMR.  Objective:  Med list reviewed  Current Outpatient Medications:    bisacodyl (DULCOLAX) 5 MG EC tablet, Take 30 mg by mouth daily as needed for moderate constipation., Disp: , Rfl:    calcium carbonate (OS-CAL) 1250 (500 Ca) MG chewable tablet, Chew by mouth., Disp: , Rfl:    cephALEXin (KEFLEX) 250 MG capsule, Take 1 capsule (250 mg total) by mouth daily., Disp: 30 capsule, Rfl: 5   estradiol (ESTRACE) 0.1 MG/GM vaginal cream, Place 0.5g nightly for two weeks then twice a week after, Disp: 30 g, Rfl: 11   Glycerin-Hypromellose-PEG 400 (DRY EYE RELIEF DROPS) 0.2-0.2-1 % SOLN, Place 1 drop into both eyes as needed (dry eyes). (Patient not taking: Reported on 01/09/2023), Disp: , Rfl:    HYDROmorphone (DILAUDID) 4 MG tablet, Take 4-8 mg by mouth every 6 (six) hours as needed (breakthrough pain)., Disp: , Rfl: 0   HYDROmorphone (DILAUDID) 4 MG tablet, Take 1-2 tablets (4-8 mg total) by mouth every 6 (six) hours as needed for breakthrough pain., Disp: 120 tablet, Rfl: 0   [START ON 04/14/2023] HYDROmorphone (DILAUDID) 4 MG tablet, Take 1-2 tablets (4-8 mg total) by mouth every 6 (six) hours as needed for breakthrough  pain, Disp: 120 tablet, Rfl: 0   hyoscyamine (LEVSIN) 0.125 MG tablet, Take 1 tablet by mouth 1-3 times a day as needed for intestinal spasms, Disp: 15 tablet, Rfl: 0   levothyroxine (SYNTHROID) 200 MCG tablet, TAKE 1 TABLET BY MOUTH EVERY DAY BEFORE BREAKFAST, Disp: 90 tablet, Rfl: 1   levothyroxine (SYNTHROID) 25 MCG tablet, Take 1 tablet (25 mcg total) by mouth daily before breakfast. For total 225 mcg daily, Disp: 90 tablet, Rfl: 1   methadone (DOLOPHINE) 10 MG tablet, Take 20 mg by mouth every 8 (eight) hours. for pain, Disp: , Rfl: 0   methadone (DOLOPHINE) 10 MG tablet, Take 1 - 2 tablets (10 - 20 mg total) by mouth every 8 hours as needed for pain., Disp: 180 tablet, Rfl: 0   naloxone (NARCAN) nasal spray 4 mg/0.1 mL, Place 1 spray into the nose once as needed (opioid overdose)., Disp: , Rfl:    nystatin-triamcinolone ointment (MYCOLOG), Apply 1 Application topically 2 (two) times daily as needed (irritation)., Disp: 120 g, Rfl: 5   RABEprazole (ACIPHEX) 20 MG tablet, Take 20 mg by mouth daily as needed (acid reflux)., Disp: , Rfl:    Skin Protectants, Misc. (INTERDRY 10"X36") SHEE, UAD, Disp: 30 each, Rfl: 5   Vibegron 75 MG TABS, Take 1 tablet (75 mg total) by mouth daily., Disp: 90 tablet, Rfl: 2   vitamin B-12 (CYANOCOBALAMIN) 1000 MCG tablet, Take 1 tablet (1,000 mcg total) by mouth daily., Disp: , Rfl:    warfarin (COUMADIN) 5 MG tablet, TAKE 1 TABLET BY MOUTH DAILY  EXCEPT TAKE 1 1/2 TABLETS ON WEDNESDAYS OR AS DIRECTED BY ANTICOAGULATION CLINIC, Disp: 110 tablet, Rfl: 1   Vital signs in last 24 hrs: There were no vitals filed for this visit. Wt Readings from Last 3 Encounters:  01/09/23 (!) 324 lb (147 kg)  12/02/22 (!) 323 lb (146.5 kg)  10/18/22 300 lb (136.1 kg)    Physical Exam  *** HEENT: sclera anicteric, oral mucosa moist without lesions Neck: supple, no thyromegaly, JVD or lymphadenopathy Cardiac: ***,  no peripheral edema Pulm: clear to auscultation bilaterally,  normal RR and effort noted Abdomen: soft, *** tenderness, with active bowel sounds. No guarding or palpable hepatosplenomegaly. Skin; warm and dry, no jaundice or rash  Labs:   ___________________________________________ Radiologic studies:   ____________________________________________ Other:   _____________________________________________ Assessment & Plan  Assessment: No diagnosis found.    Plan:   *** minutes were spent on this encounter (including chart review, history/exam, counseling/coordination of care, and documentation) > 50% of that time was spent on counseling and coordination of care.   Charlie Pitter III

## 2023-04-05 NOTE — Patient Instructions (Signed)
_______________________________________________________  If your blood pressure at your visit was 140/90 or greater, please contact your primary care physician to follow up on this.  _______________________________________________________  If you are age 69 or older, your body mass index should be between 23-30. Your Body mass index is 51.82 kg/m. If this is out of the aforementioned range listed, please consider follow up with your Primary Care Provider.  If you are age 72 or younger, your body mass index should be between 19-25. Your Body mass index is 51.82 kg/m. If this is out of the aformentioned range listed, please consider follow up with your Primary Care Provider.   ________________________________________________________  The [facility] providers would like to encourage you to use [facility] to communicate with providers for non-urgent requests or questions.  Due to long hold times on the telephone, sending your provider a message by [facility] may be a faster and more efficient way to get a response.  Please allow 48 business hours for a response.  Please remember that this is for non-urgent requests.  _______________________________________________________  Dulcolax suppository once a week.  Take Motegrity 2 mg take one tablet a day for a 7days. Then take the Trulance 3mg  tablets, take one a day.    It was a pleasure to see you today!  Thank you for trusting me with your gastrointestinal care!

## 2023-04-11 NOTE — Telephone Encounter (Signed)
[place], I would tell the patient to go ahead and stop her Motegrity and begin her Trulance. She can add Dulcolax OTC 10 mg by mouth since she is having issues with doing the suppositories. If by Wednesday of this week she is still having GI issues in regards to her constipation, then she will need a KUB 2 view to be performed and she should be given a laxative preparation for colonoscopy in an effort of trying to move her bowels (one half GoLytely versus full dose). Since Dr. Myrtie Neither is away, you can forward an update to the MD of the day or myself (but I am in the hospital this week and next week so I may be slower to reply). Thanks. GM

## 2023-04-11 NOTE — Telephone Encounter (Signed)
Dr. Meridee Score as DOD AM of 04/11/23 please advise in Dr. Myrtie Neither' absence. Thanks   Patient with a hx of chronic constipation/opioid induced constipation, abdominal pain, and bloating. Plan noted above. Pt seeking further recommendations at this time. Thanks

## 2023-04-12 ENCOUNTER — Telehealth: Payer: Self-pay

## 2023-04-12 DIAGNOSIS — Z7901 Long term (current) use of anticoagulants: Secondary | ICD-10-CM

## 2023-04-12 MED ORDER — WARFARIN SODIUM 5 MG PO TABS
ORAL_TABLET | ORAL | 1 refills | Status: DC
Start: 2023-04-12 — End: 2023-07-03

## 2023-04-12 MED ORDER — LEVOTHYROXINE SODIUM 25 MCG PO TABS: 25.0000 ug | ORAL_TABLET | Freq: Every day | ORAL | 1 refills | Status: DC

## 2023-04-12 MED ORDER — LEVOTHYROXINE SODIUM 200 MCG PO TABS: ORAL_TABLET | ORAL | 1 refills | Status: DC

## 2023-04-12 NOTE — Telephone Encounter (Signed)
Pt reports she is wanting to change pharmacies due to [facility] not filling medication in time.  Pt needs warfarin and levothyroxine. Pt would like refills sent to [facility].  Contacted pt and advised script will be sent. She is picking up a small amount of warfarin at [facility] to make sure she has enough until [facility] send the script.   Sent in levothyroxine, two different doses and warfarin.

## 2023-04-17 ENCOUNTER — Other Ambulatory Visit (HOSPITAL_COMMUNITY): Payer: Self-pay

## 2023-04-17 NOTE — Telephone Encounter (Signed)
Thank you for taking these messages while I was out of the office last week.  It sounds like the Trulance was much more effective than the Motegrity.  Please let me know if she has been continuing the samples daily and what the result has been so I can decide about prescribing it.  Thank you  H Danis

## 2023-04-18 ENCOUNTER — Ambulatory Visit (INDEPENDENT_AMBULATORY_CARE_PROVIDER_SITE_OTHER): Payer: Medicare HMO

## 2023-04-18 DIAGNOSIS — Z7901 Long term (current) use of anticoagulants: Secondary | ICD-10-CM

## 2023-04-18 LAB — POCT INR: INR: 2.5 (ref 2.0–3.0)

## 2023-04-18 NOTE — Patient Instructions (Addendum)
Pre visit review using our clinic review tool, if applicable. No additional management support is needed unless otherwise documented below in the visit note.  Continue 1 tablet daily except take 1 1/2 tablets on Sundays and Wednesdays. Recheck in 2 week, on 9/24.

## 2023-04-18 NOTE — Progress Notes (Signed)
Checks INR at home, received result of 2.5 from [employer] portal today. Continue 1 tablet daily except take 1 1/2 tablets on Sundays and Wednesdays. Recheck in 2 week, on 9/24.

## 2023-04-18 NOTE — Telephone Encounter (Signed)
I recommend 10 mg of bisacodyl/Dulcolax (2 of the 5 mg OTC tablets) today, and 10 mg again tomorrow.  If that does not jumpstart the evacuation of solid stool, then I recommend she use an OTC glycerin suppository in the morning, then another in the afternoon followed by 2 fleets enemas.  Also continue the Trulance once daily.  HD

## 2023-04-19 ENCOUNTER — Other Ambulatory Visit (HOSPITAL_COMMUNITY): Payer: Self-pay

## 2023-04-19 MED ORDER — METHADONE HCL 10 MG PO TABS
10.0000 mg | ORAL_TABLET | Freq: Three times a day (TID) | ORAL | 0 refills | Status: DC | PRN
  Filled 2023-04-19: qty 180, 30d supply, fill #0

## 2023-04-20 ENCOUNTER — Encounter: Payer: Self-pay | Admitting: Gastroenterology

## 2023-04-20 NOTE — Telephone Encounter (Signed)
It sounds like the Trulance only worked for the first dose or two.  That is the last of the prescription meds I believe we have to offer for this OIC. Movantik previously either ineffective or cost prohibitive Same for Motegrity, Linzess and Trulance.  All I can recommend at this point is the following:  Bowel purge like she did for colonoscopy, which she may need to do periodically for relief of the obstipation.  Metoclopramide 5 mg tablets x 2 Take 1 tablet an hour before starting a GoLytely bowel prep.  Take half the prep over 4 hours Repeat this process the following day.  If she does not have a complete bowel evacuation with that, she needs a CT scan of the abdomen and pelvis to rule out partial obstruction (though nothing like that was seen on CT scan 4 to 5 months ago)   - H Danis

## 2023-04-21 MED ORDER — GOLYTELY 236 G PO SOLR: 4000.0000 mL | Freq: Once | ORAL | 0 refills | Status: AC

## 2023-04-21 MED ORDER — METOCLOPRAMIDE HCL 5 MG PO TABS: ORAL_TABLET | ORAL | 0 refills | Status: DC

## 2023-04-21 NOTE — Telephone Encounter (Signed)
See 9/12 patient message for details

## 2023-04-28 ENCOUNTER — Encounter: Payer: Self-pay | Admitting: Internal Medicine

## 2023-05-03 ENCOUNTER — Ambulatory Visit (INDEPENDENT_AMBULATORY_CARE_PROVIDER_SITE_OTHER): Payer: Medicare HMO

## 2023-05-03 DIAGNOSIS — Z7901 Long term (current) use of anticoagulants: Secondary | ICD-10-CM

## 2023-05-03 DIAGNOSIS — Z86718 Personal history of other venous thrombosis and embolism: Secondary | ICD-10-CM | POA: Diagnosis not present

## 2023-05-03 LAB — POCT INR: INR: 2.1 (ref 2.0–3.0)

## 2023-05-03 NOTE — Progress Notes (Addendum)
Checks INR at home, received result of 2.1 from [facility] portal today. Continue 1 tablet daily except take 1 1/2 tablets on Sundays and Wednesdays. Recheck in 2 week, on 10/9.

## 2023-05-03 NOTE — Patient Instructions (Addendum)
Pre visit review using our clinic review tool, if applicable. No additional management support is needed unless otherwise documented below in the visit note.  Continue 1 tablet daily except take 1 1/2 tablets on Sundays and Wednesdays. Recheck in 2 week, on 10/9.

## 2023-05-03 NOTE — Progress Notes (Signed)
I have reviewed the patient's encounter and agree with the documentation.  Katina Degree. Jimmey Ralph, MD 05/03/2023 8:17 AM

## 2023-05-06 ENCOUNTER — Encounter: Payer: Self-pay | Admitting: Obstetrics and Gynecology

## 2023-05-07 ENCOUNTER — Other Ambulatory Visit (HOSPITAL_COMMUNITY): Payer: Self-pay

## 2023-05-07 MED ORDER — HYDROMORPHONE HCL 4 MG PO TABS
4.0000 mg | ORAL_TABLET | Freq: Four times a day (QID) | ORAL | 0 refills | Status: DC | PRN
Start: 2023-04-14 — End: 2024-06-06
  Filled 2023-05-07: qty 120, 15d supply, fill #0

## 2023-05-08 ENCOUNTER — Other Ambulatory Visit (HOSPITAL_COMMUNITY): Payer: Self-pay

## 2023-05-09 ENCOUNTER — Other Ambulatory Visit (HOSPITAL_COMMUNITY): Payer: Self-pay

## 2023-05-17 ENCOUNTER — Ambulatory Visit (INDEPENDENT_AMBULATORY_CARE_PROVIDER_SITE_OTHER): Payer: Self-pay

## 2023-05-17 ENCOUNTER — Other Ambulatory Visit (HOSPITAL_COMMUNITY): Payer: Self-pay

## 2023-05-17 DIAGNOSIS — Z7901 Long term (current) use of anticoagulants: Secondary | ICD-10-CM

## 2023-05-17 LAB — POCT INR: INR: 1.8 — AB (ref 2.0–3.0)

## 2023-05-17 MED ORDER — METHADONE HCL 10 MG PO TABS
10.0000 mg | ORAL_TABLET | Freq: Three times a day (TID) | ORAL | 0 refills | Status: DC | PRN
Start: 2023-05-07 — End: 2024-06-06
  Filled 2023-05-17: qty 180, 30d supply, fill #0
  Filled 2023-05-17: qty 180, 60d supply, fill #0

## 2023-05-17 NOTE — Progress Notes (Addendum)
Checks INR at home, received result of 1.8 from [employer] portal today. Pt reports using metoclopramide. No interaction with warfarin. Pt has been eating a lot of fruit.  Increase dose today to take 2 tablets and then continue 1 tablet daily except take 1 1/2 tablets on Sundays and Wednesdays. Recheck in 2 week, on 10/23. Advised pt of dosing and recheck. Pt verbalized understanding.

## 2023-05-17 NOTE — Patient Instructions (Addendum)
Pre visit review using our clinic review tool, if applicable. No additional management support is needed unless otherwise documented below in the visit note.  Increase dose today to take 2 tablets and then continue 1 tablet daily except take 1 1/2 tablets on Sundays and Wednesdays. Recheck in 2 week, on 10/23.

## 2023-05-22 ENCOUNTER — Encounter: Payer: Self-pay | Admitting: Gastroenterology

## 2023-05-23 ENCOUNTER — Encounter: Payer: Self-pay | Admitting: Internal Medicine

## 2023-05-24 NOTE — Telephone Encounter (Signed)
This is most likely hemorrhoidal bleeding from the severity of her constipation.  Every OTC and prescription medicine we have used thus far has either not been helpful or has been cost prohibitive.  We recently got some samples of a newer medicine Ibsrela for chronic constipation.  It is not likely to be as helpful as Movantik for her opioid-induced constipation, but the Movantik was cost prohibitive.  She can have several bottles of Ibsrela samples to take one 50 mg tablet twice daily.  If that medicine seems to help relieve the constipation we can send a prescription for it, but I do not know what the cost will be. She can also be given some samples of Calmol suppositories to take to relieve hemorrhoidal bleeding when it occurs.  H Danis

## 2023-05-31 ENCOUNTER — Ambulatory Visit (INDEPENDENT_AMBULATORY_CARE_PROVIDER_SITE_OTHER): Payer: Medicare HMO

## 2023-05-31 DIAGNOSIS — Z7901 Long term (current) use of anticoagulants: Secondary | ICD-10-CM

## 2023-05-31 LAB — POCT INR: INR: 1.9 — AB (ref 2.0–3.0)

## 2023-05-31 NOTE — Progress Notes (Signed)
I have reviewed the patient's encounter and agree with the documentation.  [name]. Jimmey Ralph, MD 05/31/2023 9:44 AM

## 2023-05-31 NOTE — Patient Instructions (Addendum)
Pre visit review using our clinic review tool, if applicable. No additional management support is needed unless otherwise documented below in the visit note.  Increase dose today to take 2 tablets and then change weekly dosing to take 1 tablet daily except take 1 1/2 tablets on Monday, Wednesday and Friday. Recheck in 2 week, on 11/6.

## 2023-05-31 NOTE — Progress Notes (Signed)
Checks INR at home, received result of 1.9 from [facility] portal today. Pt started IBSRELA  and CALMOL. No interactions with warfarin. Increase dose today to take 2 tablets and then change weekly dosing to take 1 tablet daily except take 1 1/2 tablets on Monday, Wednesday and Friday. Recheck in 2 week, on 11/6. Advised pt of dosing and recheck. Pt verbalized understanding.

## 2023-06-06 ENCOUNTER — Encounter: Payer: Self-pay | Admitting: Internal Medicine

## 2023-06-06 NOTE — Patient Instructions (Addendum)

## 2023-06-06 NOTE — Progress Notes (Unsigned)
Subjective:    Patient ID: [name], female    DOB: 12/01/1955, 67 y.o.   MRN: [id number]      HPI [name] is here for a Physical exam and her chronic medical problems.   She is trying to lose weight, but her weight seems to keep going up-not sure why. Her breakfast/lunch is a handful of walnuts, maybe a banana (1-2 times a week) or couple of apples w/ PB Dinner- fast food  She is walking a little.  She continues to have discomfort and fullness in her upper abdomen.  She is not sure what it is.  CT of the abdomen and pelvis did not show anything concerning back in May 2024   Medications and allergies reviewed with patient and updated if appropriate.  Current Outpatient Medications on File Prior to Visit  Medication Sig Dispense Refill   bisacodyl (DULCOLAX) 5 MG EC tablet Take 30 mg by mouth daily as needed for moderate constipation.     calcium carbonate (OS-CAL) 1250 (500 Ca) MG chewable tablet Chew by mouth.     estradiol (ESTRACE) 0.1 MG/GM vaginal cream Place 0.5g nightly for two weeks then twice a week after 30 g 11   Glycerin-Hypromellose-PEG 400 (DRY EYE RELIEF DROPS) 0.2-0.2-1 % SOLN Place 1 drop into both eyes as needed (dry eyes).     HYDROmorphone (DILAUDID) 4 MG tablet Take 1-2 tablets (4-8 mg total) by mouth every 6 (six) hours as needed for breakthrough pain. 120 tablet 0   hyoscyamine (LEVSIN) 0.125 MG tablet Take 1 tablet by mouth 1-3 times a day as needed for intestinal spasms 15 tablet 0   levothyroxine (SYNTHROID) 200 MCG tablet TAKE 1 TABLET BY MOUTH EVERY DAY BEFORE BREAKFAST 90 tablet 1   levothyroxine (SYNTHROID) 25 MCG tablet Take 1 tablet (25 mcg total) by mouth daily before breakfast. For total 225 mcg daily 90 tablet 1   methadone (DOLOPHINE) 10 MG tablet Take 1-2  tablets by mouth every 8 (eight) hours as needed for pain 180 tablet 0   metoCLOPramide (REGLAN) 5 MG tablet Take 1 tablet an hour before starting a GoLytely bowel prep. Take half the  prep over 4 hours Repeat this process the following day. 2 tablet 0   naloxone (NARCAN) nasal spray 4 mg/0.1 mL Place 1 spray into the nose once as needed (opioid overdose).     nystatin-triamcinolone ointment (MYCOLOG) Apply 1 Application topically 2 (two) times daily as needed (irritation). 120 g 5   RABEprazole (ACIPHEX) 20 MG tablet Take 20 mg by mouth daily as needed (acid reflux).     Skin Protectants, Misc. (INTERDRY 10"X36") SHEE UAD 30 each 5   Vibegron 75 MG TABS Take 1 tablet (75 mg total) by mouth daily. 90 tablet 2   vitamin B-12 (CYANOCOBALAMIN) 1000 MCG tablet Take 1 tablet (1,000 mcg total) by mouth daily.     warfarin (COUMADIN) 5 MG tablet TAKE 1 TABLET BY MOUTH DAILY EXCEPT TAKE 1 1/2 TABLETS ON SUNDAYS AND WEDNESDAYS OR AS DIRECTED BY ANTICOAGULATION CLINIC 110 tablet 1   No current facility-administered medications on file prior to visit.    Review of Systems  Constitutional:  Negative for fever.  Eyes:  Negative for visual disturbance.  Respiratory:  Positive for wheezing (only in bedroom at night). Negative for cough and shortness of breath.   Cardiovascular:  Negative for chest pain, palpitations and leg swelling.  Gastrointestinal:  Positive for abdominal pain (upper abdomen) and constipation (controlled). Negative for  blood in stool and diarrhea.       Gerd  Genitourinary:  Negative for dysuria.  Musculoskeletal:  Positive for arthralgias, back pain and joint swelling.  Skin:  Positive for rash (right arm - no pain, itching).  Neurological:  Negative for light-headedness and headaches.  Psychiatric/Behavioral:  Negative for dysphoric mood. The patient is not nervous/anxious.        Objective:   Vitals:   06/07/23 1328  BP: 114/72  Pulse: 68  Temp: 98 F (36.7 C)  SpO2: 98%   Filed Weights   06/07/23 1328  Weight: (!) 326 lb (147.9 kg)   Body mass index is 54.25 kg/m.  BP Readings from Last 3 Encounters:  06/07/23 114/72  04/05/23 110/60   01/10/23 132/83    Wt Readings from Last 3 Encounters:  06/07/23 (!) 326 lb (147.9 kg)  04/05/23 (!) 311 lb 6 oz (141.2 kg)  01/09/23 (!) 324 lb (147 kg)       Physical Exam Constitutional: She appears well-developed and well-nourished. No distress.  HENT:  Head: Normocephalic and atraumatic.  Right Ear: External ear normal. Normal ear canal and TM Left Ear: External ear normal.  Normal ear canal and TM Mouth/Throat: Oropharynx is clear and moist.  Eyes: Conjunctivae normal.  Neck: Neck supple. No tracheal deviation present. No thyromegaly present.  No carotid bruit  Cardiovascular: Normal rate, regular rhythm and normal heart sounds.   No murmur heard.  Minimal edema bilateral lower extremity-positive for chronic skin issues. Pulmonary/Chest: Effort normal and breath sounds normal. No respiratory distress. She has no wheezes. She has no rales.  Breast: deferred   Abdominal: Obese.  Soft. She exhibits no distension.  There is some slight tenderness and firmness across her upper abdomen.  There is no other tenderness.  Lymphadenopathy: She has no cervical adenopathy.  Skin: Skin is warm and dry. She is not diaphoretic.  Rash that is macular, dry on right medial upper arm and posterior forearm-nontender.  No papules or blisters.  Chronic skin thickening, dryness, discoloration bilateral lower extremities from lymphedema Psychiatric: She has a normal mood and affect. Her behavior is normal.     Lab Results  Component Value Date   WBC 5.1 12/02/2022   HGB 13.2 12/02/2022   HCT 40.2 12/02/2022   PLT 185.0 12/02/2022   GLUCOSE 84 12/02/2022   CHOL 136 12/02/2022   TRIG 136.0 12/02/2022   HDL 32.60 (L) 12/02/2022   LDLDIRECT 105.0 05/24/2019   LDLCALC 76 12/02/2022   ALT 12 12/02/2022   AST 20 12/02/2022   NA 136 12/02/2022   K 3.7 12/02/2022   CL 98 12/02/2022   CREATININE 0.65 12/02/2022   BUN 10 12/02/2022   CO2 32 12/02/2022   TSH 0.81 12/02/2022   INR 1.9 (A)  05/31/2023   HGBA1C 5.6 12/02/2022         Assessment & Plan:   Physical exam: Screening blood work  ordered Exercise  walking a little Weight  - trying to lose weight but is gaining weight-discussed that she cannot drink [employer] any fast food and expect to lose weight.  Discussed that she is consuming too many calories which is why her weight is going up. Substance abuse  none   Reviewed recommended immunizations.  Vaccines up-to-date through the pharmacy.   Health Maintenance  Topic Date Due   DTaP/Tdap/Td (2 - Td or Tdap) 06/06/2024 (Originally 06/22/2022)   COVID-19 Vaccine (10 - 2023-24 season) 06/22/2023   MAMMOGRAM  09/08/2023  Medicare Annual Wellness (AWV)  10/18/2023   DEXA SCAN  03/28/2025   Colonoscopy  06/17/2027   Pneumonia Vaccine 35+ Years old  Completed   INFLUENZA VACCINE  Completed   Hepatitis C Screening  Completed   HPV VACCINES  Aged Out   Fecal DNA (Cologuard)  Discontinued   Zoster Vaccines- Shingrix  Discontinued          See Problem List for Assessment and Plan of chronic medical problems.

## 2023-06-07 ENCOUNTER — Ambulatory Visit: Payer: Medicare HMO | Admitting: Internal Medicine

## 2023-06-07 VITALS — BP 114/72 | HR 68 | Temp 98.0°F | Ht 65.0 in | Wt 326.0 lb

## 2023-06-07 DIAGNOSIS — M85852 Other specified disorders of bone density and structure, left thigh: Secondary | ICD-10-CM

## 2023-06-07 DIAGNOSIS — R10819 Abdominal tenderness, unspecified site: Secondary | ICD-10-CM

## 2023-06-07 DIAGNOSIS — E039 Hypothyroidism, unspecified: Secondary | ICD-10-CM | POA: Diagnosis not present

## 2023-06-07 DIAGNOSIS — M545 Low back pain, unspecified: Secondary | ICD-10-CM

## 2023-06-07 DIAGNOSIS — G8929 Other chronic pain: Secondary | ICD-10-CM

## 2023-06-07 DIAGNOSIS — Z7901 Long term (current) use of anticoagulants: Secondary | ICD-10-CM

## 2023-06-07 DIAGNOSIS — Z0000 Encounter for general adult medical examination without abnormal findings: Secondary | ICD-10-CM | POA: Diagnosis not present

## 2023-06-07 DIAGNOSIS — I700 Atherosclerosis of aorta: Secondary | ICD-10-CM | POA: Diagnosis not present

## 2023-06-07 DIAGNOSIS — E782 Mixed hyperlipidemia: Secondary | ICD-10-CM

## 2023-06-07 DIAGNOSIS — E538 Deficiency of other specified B group vitamins: Secondary | ICD-10-CM

## 2023-06-07 DIAGNOSIS — R6889 Other general symptoms and signs: Secondary | ICD-10-CM | POA: Diagnosis not present

## 2023-06-07 LAB — LIPID PANEL
Cholesterol: 151 mg/dL (ref 0–200)
HDL: 35.9 mg/dL — ABNORMAL LOW (ref >39.00)
LDL Cholesterol: 91 mg/dL (ref 0–99)
NonHDL: 115.4
Total CHOL/HDL Ratio: 4
Triglycerides: 122 mg/dL (ref 0.0–149.0)
VLDL: 24.4 mg/dL (ref 0.0–40.0)

## 2023-06-07 LAB — CBC WITH DIFFERENTIAL/PLATELET
Basophils Absolute: 0 10*3/uL (ref 0.0–0.1)
Basophils Relative: 0.5 % (ref 0.0–3.0)
Eosinophils Absolute: 0.1 10*3/uL (ref 0.0–0.7)
Eosinophils Relative: 1.8 % (ref 0.0–5.0)
HCT: 42.4 % (ref 36.0–46.0)
Hemoglobin: 13.4 g/dL (ref 12.0–15.0)
Lymphocytes Relative: 30 % (ref 12.0–46.0)
Lymphs Abs: 1.6 10*3/uL (ref 0.7–4.0)
MCHC: 31.7 g/dL (ref 30.0–36.0)
MCV: 91.7 fL (ref 78.0–100.0)
Monocytes Absolute: 0.5 10*3/uL (ref 0.1–1.0)
Monocytes Relative: 8.9 % (ref 3.0–12.0)
Neutro Abs: 3.1 10*3/uL (ref 1.4–7.7)
Neutrophils Relative %: 58.8 % (ref 43.0–77.0)
Platelets: 174 10*3/uL (ref 150.0–400.0)
RBC: 4.62 Mil/uL (ref 3.87–5.11)
RDW: 15.4 % (ref 11.5–15.5)
WBC: 5.2 10*3/uL (ref 4.0–10.5)

## 2023-06-07 LAB — COMPREHENSIVE METABOLIC PANEL
ALT: 18 U/L (ref 0–35)
AST: 28 U/L (ref 0–37)
Albumin: 3.9 g/dL (ref 3.5–5.2)
Alkaline Phosphatase: 86 U/L (ref 39–117)
BUN: 10 mg/dL (ref 6–23)
CO2: 33 meq/L — ABNORMAL HIGH (ref 19–32)
Calcium: 9.2 mg/dL (ref 8.4–10.5)
Chloride: 99 meq/L (ref 96–112)
Creatinine, Ser: 0.73 mg/dL (ref 0.40–1.20)
GFR: 83.88 mL/min (ref >60.00)
Glucose, Bld: 86 mg/dL (ref 70–99)
Potassium: 3.9 meq/L (ref 3.5–5.1)
Sodium: 137 meq/L (ref 135–145)
Total Bilirubin: 0.6 mg/dL (ref 0.2–1.2)
Total Protein: 8 g/dL (ref 6.0–8.3)

## 2023-06-07 LAB — VITAMIN D 25 HYDROXY (VIT D DEFICIENCY, FRACTURES): VITD: 10.5 ng/mL — ABNORMAL LOW (ref 30.00–100.00)

## 2023-06-07 LAB — VITAMIN B12: Vitamin B-12: 145 pg/mL — ABNORMAL LOW (ref 211–911)

## 2023-06-07 LAB — TSH: TSH: 2.57 u[IU]/mL (ref 0.35–5.50)

## 2023-06-07 NOTE — Assessment & Plan Note (Signed)
Chronic Regular exercise and healthy diet encouraged Check lipid panel  Currently lifestyle controlled, statin intolerant - defers statin  Lab Results  Component Value Date   LDLCALC 76 12/02/2022

## 2023-06-07 NOTE — Assessment & Plan Note (Signed)
Chronic LDL near goal Not currently on any statin Check lipid panel Stressed healthy diet, increased exercise, weight loss

## 2023-06-07 NOTE — Assessment & Plan Note (Signed)
Chronic Following with pain management/neurosurgery She is not a surgical candidate She has significant pain and decreased mobility with recurrent falls She was advised by neurosurgery that she needs a motorized wheelchair Pain management per neurosurgery

## 2023-06-07 NOTE — Assessment & Plan Note (Signed)
Chronic Clinically euthyroid Check tsh and will titrate med dose if needed Currently taking levothyroxine 225 mcg daily

## 2023-06-07 NOTE — Assessment & Plan Note (Addendum)
Chronic On warfarin long-term for history of PE, DVT Warfarin dosing managed by our Coumadin clinic Continue current dose of warfarin CBC, CMP

## 2023-06-07 NOTE — Assessment & Plan Note (Signed)
Chronic DEXA up-to-date Encouraged more exercise Continue calcium and vitamin D supplementation

## 2023-06-07 NOTE — Assessment & Plan Note (Signed)
Chronic Taking vitamin B12 daily Check level

## 2023-06-07 NOTE — Assessment & Plan Note (Signed)
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

## 2023-06-08 ENCOUNTER — Other Ambulatory Visit (HOSPITAL_COMMUNITY): Payer: Self-pay

## 2023-06-08 MED ORDER — HYDROMORPHONE HCL 4 MG PO TABS
4.0000 mg | ORAL_TABLET | Freq: Four times a day (QID) | ORAL | 0 refills | Status: DC | PRN
  Filled 2023-08-10 (×2): qty 30, 4d supply, fill #0

## 2023-06-08 MED ORDER — HYDROMORPHONE HCL 4 MG PO TABS
4.0000 mg | ORAL_TABLET | Freq: Four times a day (QID) | ORAL | 0 refills | Status: DC | PRN
  Filled 2023-06-30: qty 120, 15d supply, fill #0

## 2023-06-08 MED ORDER — VITAMIN D (ERGOCALCIFEROL) 1.25 MG (50000 UNIT) PO CAPS
50000.0000 [IU] | ORAL_CAPSULE | ORAL | 0 refills | Status: AC
  Filled 2023-06-08: qty 12, 84d supply, fill #0

## 2023-06-08 NOTE — Addendum Note (Signed)
Addended by: Pincus Sanes on: 06/08/2023 07:44 AM   Modules accepted: Orders

## 2023-06-09 ENCOUNTER — Other Ambulatory Visit (HOSPITAL_COMMUNITY): Payer: Self-pay

## 2023-06-12 ENCOUNTER — Other Ambulatory Visit (HOSPITAL_COMMUNITY): Payer: Self-pay

## 2023-06-13 ENCOUNTER — Encounter: Payer: Self-pay | Admitting: Gastroenterology

## 2023-06-13 NOTE — Telephone Encounter (Signed)
We have no way to change the insurance coverage and cost of the medicine, but if prior authorization is needed, then please work on it.  Please send a prescription for this medicine once a day and she can be given some additional samples if we have them.  Ellwood Dense, MD

## 2023-06-14 ENCOUNTER — Other Ambulatory Visit: Payer: Self-pay

## 2023-06-14 ENCOUNTER — Ambulatory Visit (INDEPENDENT_AMBULATORY_CARE_PROVIDER_SITE_OTHER): Payer: Self-pay

## 2023-06-14 ENCOUNTER — Other Ambulatory Visit (HOSPITAL_COMMUNITY): Payer: Self-pay

## 2023-06-14 DIAGNOSIS — Z7901 Long term (current) use of anticoagulants: Secondary | ICD-10-CM | POA: Diagnosis not present

## 2023-06-14 DIAGNOSIS — K581 Irritable bowel syndrome with constipation: Secondary | ICD-10-CM

## 2023-06-14 DIAGNOSIS — K5909 Other constipation: Secondary | ICD-10-CM

## 2023-06-14 LAB — POCT INR: INR: 2.2 (ref 2.0–3.0)

## 2023-06-14 MED ORDER — IBSRELA 50 MG PO TABS
50.0000 mg | ORAL_TABLET | Freq: Every day | ORAL | 1 refills | Status: DC
  Filled 2023-06-14: qty 30, 30d supply, fill #0
  Filled 2023-09-06: qty 30, 30d supply, fill #1

## 2023-06-14 MED ORDER — METHADONE HCL 10 MG PO TABS
10.0000 mg | ORAL_TABLET | Freq: Three times a day (TID) | ORAL | 0 refills | Status: DC | PRN
  Filled 2023-06-14: qty 180, 30d supply, fill #0

## 2023-06-14 NOTE — Progress Notes (Signed)
I have reviewed and agree with note, evaluation, plan.   Jeffory Snelgrove, MD  

## 2023-06-14 NOTE — Progress Notes (Signed)
Pt reports she had a very large spinach salad a few days ago. Pt has also started Ibsrela for constipation. Continue 1 tablet daily except take 1 1/2 tablets on Monday, Wednesday and Friday. Recheck in 2 week, on 11/20. Advised pt of dosing and recheck. Pt verbalized understanding.

## 2023-06-14 NOTE — Patient Instructions (Addendum)
Pre visit review using our clinic review tool, if applicable. No additional management support is needed unless otherwise documented below in the visit note.  Continue 1 tablet daily except take 1 1/2 tablets on Monday, Wednesday and Friday. Recheck in 2 week, on 11/20.

## 2023-06-14 NOTE — Telephone Encounter (Signed)
Prescription sent to pharmacy. Pt made aware. Please assist with PA

## 2023-06-16 ENCOUNTER — Telehealth: Payer: Self-pay | Admitting: Pharmacy Technician

## 2023-06-16 ENCOUNTER — Other Ambulatory Visit (HOSPITAL_COMMUNITY): Payer: Self-pay

## 2023-06-16 NOTE — Telephone Encounter (Signed)
Pharmacy Patient Advocate Encounter  Received notification from [facility] that Prior Authorization for [facility] 50MG  has been APPROVED from 1.1.24 to 12.31.25. Ran test claim, Copay is $0. This test claim was processed through [facility]- copay amounts may vary at other pharmacies due to pharmacy/plan contracts, or as the patient moves through the different stages of their insurance plan.   PA #/Case ID/Reference #: [id number]

## 2023-06-16 NOTE — Telephone Encounter (Signed)
Pharmacy Patient Advocate Encounter   Received notification from CoverMyMeds that prior authorization for IBSRELA 50MG  is required/requested.   Insurance verification completed.   The patient is insured through [place] .   Per test claim: PA required; PA submitted to above mentioned insurance via CoverMyMeds Key/confirmation #/EOC [id number] Status is pending

## 2023-06-22 ENCOUNTER — Encounter: Payer: Self-pay | Admitting: Gastroenterology

## 2023-06-27 ENCOUNTER — Other Ambulatory Visit (HOSPITAL_COMMUNITY): Payer: Self-pay

## 2023-06-28 ENCOUNTER — Other Ambulatory Visit (HOSPITAL_COMMUNITY): Payer: Self-pay

## 2023-06-28 ENCOUNTER — Ambulatory Visit (INDEPENDENT_AMBULATORY_CARE_PROVIDER_SITE_OTHER): Payer: Medicare HMO

## 2023-06-28 ENCOUNTER — Encounter: Payer: Self-pay | Admitting: Internal Medicine

## 2023-06-28 DIAGNOSIS — Z86718 Personal history of other venous thrombosis and embolism: Secondary | ICD-10-CM | POA: Diagnosis not present

## 2023-06-28 DIAGNOSIS — Z7901 Long term (current) use of anticoagulants: Secondary | ICD-10-CM | POA: Diagnosis not present

## 2023-06-28 LAB — POCT INR: INR: 2.1 (ref 2.0–3.0)

## 2023-06-28 NOTE — Progress Notes (Signed)
I have reviewed and agree with note, evaluation, plan.   Jeffory Snelgrove, MD  

## 2023-06-28 NOTE — Progress Notes (Signed)
Checks INR at home, received result of 2.1 from [employer] portal today. Pt started taking IBSRELA. No interaction with warfarin. Continue 1 tablet daily except take 1 1/2 tablets on Monday, Wednesday and Friday. Recheck in 2 week, on 12/4. LVM with dosing instruction.

## 2023-06-28 NOTE — Patient Instructions (Signed)
Pre visit review using our clinic review tool, if applicable. No additional management support is needed unless otherwise documented below in the visit note.  Continue 1 tablet daily except take 1 1/2 tablets on Monday, Wednesday and Friday. Recheck in 2 week, on 12/4.

## 2023-06-29 ENCOUNTER — Other Ambulatory Visit (HOSPITAL_COMMUNITY): Payer: Self-pay

## 2023-06-30 ENCOUNTER — Other Ambulatory Visit (HOSPITAL_COMMUNITY): Payer: Self-pay

## 2023-06-30 MED ORDER — SILVER SULFADIAZINE 1 % EX CREA
1.0000 | TOPICAL_CREAM | Freq: Every day | CUTANEOUS | 0 refills | Status: DC
  Filled 2023-06-30: qty 50, 50d supply, fill #0

## 2023-07-03 ENCOUNTER — Other Ambulatory Visit: Payer: Self-pay

## 2023-07-03 ENCOUNTER — Other Ambulatory Visit (HOSPITAL_COMMUNITY): Payer: Self-pay

## 2023-07-03 ENCOUNTER — Telehealth: Payer: Self-pay | Admitting: Internal Medicine

## 2023-07-03 DIAGNOSIS — Z7901 Long term (current) use of anticoagulants: Secondary | ICD-10-CM

## 2023-07-03 MED ORDER — LEVOTHYROXINE SODIUM 200 MCG PO TABS
200.0000 ug | ORAL_TABLET | Freq: Every day | ORAL | 1 refills | Status: DC
  Filled 2023-07-03 – 2023-07-12 (×4): qty 90, 90d supply, fill #0
  Filled 2023-09-26: qty 90, 90d supply, fill #1

## 2023-07-03 MED ORDER — WARFARIN SODIUM 5 MG PO TABS
ORAL_TABLET | ORAL | 1 refills | Status: DC
  Filled 2023-07-03: qty 110, 90d supply, fill #0
  Filled 2023-09-26: qty 110, 90d supply, fill #1

## 2023-07-03 MED ORDER — LEVOTHYROXINE SODIUM 25 MCG PO TABS
25.0000 ug | ORAL_TABLET | Freq: Every day | ORAL | 1 refills | Status: DC
  Filled 2023-07-03: qty 90, 90d supply, fill #0
  Filled 2023-09-26: qty 90, 90d supply, fill #1

## 2023-07-03 NOTE — Telephone Encounter (Signed)
Next OV is 07/05/2023.   Prescription Request  07/03/2023  LOV: 06/07/2023  What is the name of the medication or equipment?  levothyroxine (SYNTHROID) 25 MCG tablet  levothyroxine (SYNTHROID) 200 MCG tablet warfarin (COUMADIN) 5 MG tablet   Have you contacted your pharmacy to request a refill? Yes   Which pharmacy would you like this sent to?  [facility] [address] [place] [address] Phone: [phone] Fax: [phone]    Patient notified that their request is being sent to the clinical staff for review and that they should receive a response within 2 business days.   Please advise at Mobile [phone] (mobile)

## 2023-07-03 NOTE — Telephone Encounter (Signed)
Refills sent today.

## 2023-07-04 ENCOUNTER — Other Ambulatory Visit (HOSPITAL_COMMUNITY): Payer: Self-pay

## 2023-07-05 ENCOUNTER — Ambulatory Visit: Payer: Medicare HMO | Admitting: Internal Medicine

## 2023-07-05 ENCOUNTER — Other Ambulatory Visit (HOSPITAL_COMMUNITY): Payer: Self-pay

## 2023-07-05 ENCOUNTER — Ambulatory Visit: Payer: Medicare HMO | Admitting: Family Medicine

## 2023-07-10 ENCOUNTER — Other Ambulatory Visit (HOSPITAL_COMMUNITY): Payer: Self-pay

## 2023-07-10 MED ORDER — HYDROMORPHONE HCL 4 MG PO TABS
4.0000 mg | ORAL_TABLET | Freq: Four times a day (QID) | ORAL | 0 refills | Status: DC | PRN
  Filled 2023-07-10 – 2023-07-12 (×3): qty 120, 30d supply, fill #0

## 2023-07-11 ENCOUNTER — Other Ambulatory Visit (HOSPITAL_COMMUNITY): Payer: Self-pay

## 2023-07-12 ENCOUNTER — Ambulatory Visit (INDEPENDENT_AMBULATORY_CARE_PROVIDER_SITE_OTHER): Payer: Medicare HMO

## 2023-07-12 ENCOUNTER — Other Ambulatory Visit: Payer: Self-pay

## 2023-07-12 DIAGNOSIS — Z7901 Long term (current) use of anticoagulants: Secondary | ICD-10-CM

## 2023-07-12 LAB — POCT INR: INR: 2.5 (ref 2.0–3.0)

## 2023-07-12 NOTE — Progress Notes (Signed)
I have reviewed and agree with note, evaluation, plan.   Jeffory Snelgrove, MD  

## 2023-07-12 NOTE — Progress Notes (Signed)
Checks INR at home, received result of 2.5 from [employer] portal today. Continue 1 tablet daily except take 1 1/2 tablets on Monday, Wednesday and Friday. Recheck in 2 week, on 12/18. LVM with dosing instruction.

## 2023-07-12 NOTE — Patient Instructions (Addendum)
Pre visit review using our clinic review tool, if applicable. No additional management support is needed unless otherwise documented below in the visit note.  Continue 1 tablet daily except take 1 1/2 tablets on Monday, Wednesday and Friday. Recheck in 2 week, on 12/18.

## 2023-07-13 ENCOUNTER — Other Ambulatory Visit: Payer: Self-pay

## 2023-07-13 ENCOUNTER — Ambulatory Visit: Payer: Medicare HMO | Admitting: Obstetrics and Gynecology

## 2023-07-13 ENCOUNTER — Other Ambulatory Visit (HOSPITAL_COMMUNITY): Payer: Self-pay

## 2023-07-13 MED ORDER — METHADONE HCL 10 MG PO TABS
10.0000 mg | ORAL_TABLET | Freq: Three times a day (TID) | ORAL | 0 refills | Status: DC | PRN
  Filled 2023-07-13: qty 180, 30d supply, fill #0

## 2023-07-14 ENCOUNTER — Other Ambulatory Visit: Payer: Self-pay

## 2023-07-14 ENCOUNTER — Other Ambulatory Visit (HOSPITAL_COMMUNITY): Payer: Self-pay

## 2023-07-17 ENCOUNTER — Other Ambulatory Visit: Payer: Self-pay

## 2023-07-17 ENCOUNTER — Other Ambulatory Visit (HOSPITAL_COMMUNITY): Payer: Self-pay

## 2023-07-17 MED ORDER — CEPHALEXIN 250 MG PO CAPS
250.0000 mg | ORAL_CAPSULE | Freq: Every day | ORAL | 5 refills | Status: DC
  Filled 2023-07-17: qty 30, 30d supply, fill #0
  Filled 2023-09-01 (×2): qty 30, 30d supply, fill #1
  Filled 2023-10-05 (×3): qty 30, 30d supply, fill #2

## 2023-07-19 ENCOUNTER — Telehealth: Payer: Self-pay | Admitting: Internal Medicine

## 2023-07-19 NOTE — Telephone Encounter (Signed)
  Dan with [employer] [phone] he has problems with an diagnosis code.  Please call back

## 2023-07-20 NOTE — Telephone Encounter (Signed)
Spoke with [name] today and he is refaxing form along with DX codes to chose from.

## 2023-07-24 ENCOUNTER — Other Ambulatory Visit (HOSPITAL_COMMUNITY): Payer: Self-pay

## 2023-07-26 ENCOUNTER — Ambulatory Visit (INDEPENDENT_AMBULATORY_CARE_PROVIDER_SITE_OTHER): Payer: Medicare HMO

## 2023-07-26 DIAGNOSIS — Z7901 Long term (current) use of anticoagulants: Secondary | ICD-10-CM

## 2023-07-26 LAB — POCT INR: INR: 2.9 (ref 2.0–3.0)

## 2023-07-26 NOTE — Progress Notes (Signed)
I have reviewed and agree with note, evaluation, plan.   Jeffory Snelgrove, MD  

## 2023-07-26 NOTE — Patient Instructions (Addendum)
Pre visit review using our clinic review tool, if applicable. No additional management support is needed unless otherwise documented below in the visit note.  Continue 1 tablet daily except take 1 1/2 tablets on Sunday and Thursday. Recheck in 2 week, on 08/10/23.

## 2023-07-26 NOTE — Progress Notes (Addendum)
Checks INR at home, received result of 2.9 from [employer] portal today.  Reports she is not sure what happened but she has only been taking 1 1/2 tablets on Sundays and Thursdays and not S, W, & F.  Her dose was changed about 3 months ago when her INR was consistently subtherapeutic.  Since pt has been in range for the last two check no changes to dosing pt has been taking. Updated dosing calendar to reflect dose pt has been taking.  Continue 1 tablet daily except take 1 1/2 tablets on Sunday and Thursday. Recheck in 2 week, on 08/10/23. Contacted pt and advised of dosing and recheck. Pt verbalized understanding.

## 2023-08-08 ENCOUNTER — Other Ambulatory Visit (HOSPITAL_COMMUNITY): Payer: Self-pay

## 2023-08-08 ENCOUNTER — Ambulatory Visit (INDEPENDENT_AMBULATORY_CARE_PROVIDER_SITE_OTHER): Payer: Self-pay

## 2023-08-08 DIAGNOSIS — Z7901 Long term (current) use of anticoagulants: Secondary | ICD-10-CM | POA: Diagnosis not present

## 2023-08-08 LAB — POCT INR: INR: 2.7 (ref 2.0–3.0)

## 2023-08-08 MED ORDER — HYDROMORPHONE HCL 4 MG PO TABS
4.0000 mg | ORAL_TABLET | Freq: Four times a day (QID) | ORAL | 0 refills | Status: DC | PRN
  Filled 2023-10-16: qty 120, 30d supply, fill #0

## 2023-08-08 MED ORDER — HYDROMORPHONE HCL 4 MG PO TABS
4.0000 mg | ORAL_TABLET | Freq: Four times a day (QID) | ORAL | 0 refills | Status: DC | PRN
  Filled 2023-08-15: qty 120, 30d supply, fill #0

## 2023-08-08 NOTE — Progress Notes (Addendum)
 Checks INR at home, received result of 2.7 from [employer] portal today. Continue 1 tablet daily except take 1 1/2 tablets on Sunday and Thursday. Recheck in 2 week, on 08/22/23. Contacted pt and advised of dosing and recheck. Pt verbalized understanding.

## 2023-08-08 NOTE — Patient Instructions (Addendum)
 Pre visit review using our clinic review tool, if applicable. No additional management support is needed unless otherwise documented below in the visit note.  Continue 1 tablet daily except take 1 1/2 tablets on Sunday and Thursday. Recheck in 2 week, on 08/22/23.

## 2023-08-10 ENCOUNTER — Other Ambulatory Visit (HOSPITAL_COMMUNITY): Payer: Self-pay

## 2023-08-10 ENCOUNTER — Other Ambulatory Visit: Payer: Self-pay

## 2023-08-10 MED ORDER — METHADONE HCL 10 MG PO TABS
ORAL_TABLET | ORAL | 0 refills | Status: DC
  Filled 2023-08-10: qty 180, 30d supply, fill #0

## 2023-08-11 ENCOUNTER — Other Ambulatory Visit (HOSPITAL_COMMUNITY): Payer: Self-pay

## 2023-08-15 ENCOUNTER — Other Ambulatory Visit: Payer: Self-pay

## 2023-08-23 ENCOUNTER — Ambulatory Visit (INDEPENDENT_AMBULATORY_CARE_PROVIDER_SITE_OTHER): Payer: Self-pay

## 2023-08-23 DIAGNOSIS — Z7901 Long term (current) use of anticoagulants: Secondary | ICD-10-CM | POA: Diagnosis not present

## 2023-08-23 DIAGNOSIS — Z86718 Personal history of other venous thrombosis and embolism: Secondary | ICD-10-CM | POA: Diagnosis not present

## 2023-08-23 LAB — POCT INR: INR: 2.3 (ref 2.0–3.0)

## 2023-08-23 NOTE — Patient Instructions (Addendum)
 Pre visit review using our clinic review tool, if applicable. No additional management support is needed unless otherwise documented below in the visit note.  Continue 1 tablet daily except take 1 1/2 tablets on Sunday and Thursday. Recheck in 2 week, on 09/06/23.

## 2023-08-23 NOTE — Progress Notes (Signed)
 I have reviewed and agree with note, evaluation, plan.   Tana Conch, MD

## 2023-08-23 NOTE — Progress Notes (Signed)
 Checks INR at home, received result of 2.3 from [employer] portal today. Continue 1 tablet daily except take 1 1/2 tablets on Sunday and Thursday. Recheck in 2 week, on 09/06/23. Contacted pt and advised of dosing and recheck. Pt verbalized understanding.

## 2023-08-24 ENCOUNTER — Encounter: Payer: Self-pay | Admitting: Gastroenterology

## 2023-08-27 ENCOUNTER — Other Ambulatory Visit: Payer: Self-pay | Admitting: Internal Medicine

## 2023-08-30 ENCOUNTER — Other Ambulatory Visit (HOSPITAL_COMMUNITY): Payer: Self-pay

## 2023-08-30 MED ORDER — HYDROMORPHONE HCL 4 MG PO TABS
4.0000 mg | ORAL_TABLET | Freq: Four times a day (QID) | ORAL | 0 refills | Status: DC | PRN
  Filled 2023-09-14: qty 120, 30d supply, fill #0

## 2023-09-01 ENCOUNTER — Other Ambulatory Visit (HOSPITAL_COMMUNITY): Payer: Self-pay

## 2023-09-06 ENCOUNTER — Other Ambulatory Visit (HOSPITAL_COMMUNITY): Payer: Self-pay

## 2023-09-06 ENCOUNTER — Ambulatory Visit (INDEPENDENT_AMBULATORY_CARE_PROVIDER_SITE_OTHER): Payer: Self-pay

## 2023-09-06 DIAGNOSIS — Z7901 Long term (current) use of anticoagulants: Secondary | ICD-10-CM

## 2023-09-06 LAB — POCT INR: INR: 3 (ref 2.0–3.0)

## 2023-09-06 MED ORDER — METHADONE HCL 10 MG PO TABS
10.0000 mg | ORAL_TABLET | Freq: Three times a day (TID) | ORAL | 0 refills | Status: DC | PRN
  Filled 2023-09-06: qty 180, 60d supply, fill #0
  Filled 2023-09-07: qty 180, 30d supply, fill #0

## 2023-09-06 NOTE — Progress Notes (Signed)
I have reviewed and agree with note, evaluation, plan.   Tana Conch, MD

## 2023-09-06 NOTE — Patient Instructions (Addendum)
Pre visit review using our clinic review tool, if applicable. No additional management support is needed unless otherwise documented below in the visit note.  Continue 1 tablet daily except take 1 1/2 tablets on Sunday and Thursday. Recheck in 2 week, on 09/20/23.

## 2023-09-06 NOTE — Progress Notes (Signed)
Checks INR at home, received result of 3.0 from [employer] portal today. Pt started taking ibsrela. No interactions with warfarin. Continue 1 tablet daily except take 1 1/2 tablets on Sunday and Thursday. Recheck in 2 week, on 09/20/23. LVM and advised of dosing and recheck and to contact coumadin clinic if any changes.

## 2023-09-07 ENCOUNTER — Other Ambulatory Visit (HOSPITAL_COMMUNITY): Payer: Self-pay

## 2023-09-08 ENCOUNTER — Ambulatory Visit (INDEPENDENT_AMBULATORY_CARE_PROVIDER_SITE_OTHER): Payer: Medicare HMO

## 2023-09-08 ENCOUNTER — Other Ambulatory Visit (HOSPITAL_COMMUNITY): Payer: Self-pay

## 2023-09-08 VITALS — Ht 65.0 in | Wt 326.0 lb

## 2023-09-08 DIAGNOSIS — Z0000 Encounter for general adult medical examination without abnormal findings: Secondary | ICD-10-CM | POA: Diagnosis not present

## 2023-09-08 NOTE — Progress Notes (Signed)
Subjective:   [name] is a 68 y.o. female who presents for Medicare Annual (Subsequent) preventive examination.  Visit Complete: Virtual I connected with  [name] on 09/08/23 by a video and audio enabled telemedicine application and verified that I am speaking with the correct person using two identifiers.  Patient Location: Home  Provider Location: Office/Clinic  I discussed the limitations of evaluation and management by telemedicine. The patient expressed understanding and agreed to proceed.  Vital Signs: Because this visit was a virtual/telehealth visit, some criteria may be missing or patient reported. Any vitals not documented were not able to be obtained and vitals that have been documented are patient reported.   Cardiac Risk Factors include: advanced age (>8men, >40 women);Other (see comment);obesity (BMI >30kg/m2), Risk factor comments: Aortic atherosclerosis, COPD     Objective:    Today's Vitals   09/08/23 1009 09/08/23 1010  Weight: (!) 326 lb (147.9 kg)   Height: 5\' 5"  (1.651 m)   PainSc:  6    Body mass index is 54.25 kg/m.     09/08/2023   10:28 AM 10/18/2022    1:52 PM 06/16/2022    7:27 AM 10/11/2021    2:43 PM 07/25/2021    6:42 PM 09/29/2020   12:42 PM 04/17/2018    1:23 PM  Advanced Directives  Does Patient Have a Medical Advance Directive? Yes Yes No No No Yes Yes  Type of Sales promotion account executive of [place];Living will     Healthcare Power of [place];Living will  Does patient want to make changes to medical advance directive?      No - Patient declined   Copy of Healthcare Power of Attorney in Chart? No - copy requested No - copy requested     No - copy requested  Would patient like information on creating a medical advance directive?  No - Patient declined No - Patient declined No - Patient declined No - Patient declined      Current Medications (verified) Outpatient Encounter Medications as of  09/08/2023  Medication Sig   bisacodyl (DULCOLAX) 5 MG EC tablet Take 30 mg by mouth daily as needed for moderate constipation.   calcium carbonate (OS-CAL) 1250 (500 Ca) MG chewable tablet Chew by mouth.   cephALEXin (KEFLEX) 250 MG capsule Take 1 capsule (250 mg total) by mouth daily.   estradiol (ESTRACE) 0.1 MG/GM vaginal cream Place 0.5g nightly for two weeks then twice a week after   Glycerin-Hypromellose-PEG 400 (DRY EYE RELIEF DROPS) 0.2-0.2-1 % SOLN Place 1 drop into both eyes as needed (dry eyes).   HYDROmorphone (DILAUDID) 4 MG tablet Take 1-2 tablets (4-8 mg total) by mouth every 6 (six) hours as needed for breakthrough pain.   HYDROmorphone (DILAUDID) 4 MG tablet Take 1 - 2 tablets (4 - 8 mg total) by mouth every 6 (six) hours as needed for breakthrough pain   HYDROmorphone (DILAUDID) 4 MG tablet Take 1 tablet (4 mg total) by mouth every 6 (six) hours as needed for breakthrough pain   HYDROmorphone (DILAUDID) 4 MG tablet Take 1 tablet (4 mg total) by mouth every 6 (six) hours as needed for breakthrough pain   HYDROmorphone (DILAUDID) 4 MG tablet Take 1 tablet (4 mg total) by mouth every 6 (six) hours as needed for breakthrough pain   HYDROmorphone (DILAUDID) 4 MG tablet Take 1 tablet (4 mg total) by mouth every 6 (six) hours as needed for breakthrough pain.   hyoscyamine (LEVSIN) 0.125  MG tablet Take 1 tablet by mouth 1-3 times a day as needed for intestinal spasms   levothyroxine (SYNTHROID) 200 MCG tablet Take 1 tablet (200 mcg total) by mouth daily before breakfast.   levothyroxine (SYNTHROID) 25 MCG tablet Take 1 tablet (25 mcg total) by mouth daily before breakfast. For total 225 mcg daily   methadone (DOLOPHINE) 10 MG tablet Take 1-2  tablets by mouth every 8 (eight) hours as needed for pain   methadone (DOLOPHINE) 10 MG tablet Take 1-2 tablets (10-20 mg total) by mouth every 8 (eight) hours as needed for pain   methadone (DOLOPHINE) 10 MG tablet Take 1-2 tablets (10-20 mg total)  by mouth every 8 (eight) hours as needed for pain   methadone (DOLOPHINE) 10 MG tablet Take 1-2 tablets (10-20 mg total) by mouth every 8 (eight) hours as needed for pain.   naloxone (NARCAN) nasal spray 4 mg/0.1 mL Place 1 spray into the nose once as needed (opioid overdose).   nystatin-triamcinolone ointment (MYCOLOG) Apply 1 Application topically 2 (two) times daily as needed (irritation).   silver sulfADIAZINE (SILVADENE) 1 % cream Apply topically daily as directed   Skin Protectants, Misc. (INTERDRY 10"X36") SHEE UAD   vitamin B-12 (CYANOCOBALAMIN) 1000 MCG tablet Take 1 tablet (1,000 mcg total) by mouth daily.   warfarin (COUMADIN) 5 MG tablet TAKE 1 TABLET BY MOUTH DAILY EXCEPT TAKE 1 1/2 TABLETS ON SUNDAYS AND WEDNESDAYS OR AS DIRECTED BY ANTICOAGULATION CLINIC   HYDROmorphone (DILAUDID) 4 MG tablet Take 1 - 2 tablets (4 - 8mg  total) by mouth every 6 (six) hours as needed for breakthrough pain   metoCLOPramide (REGLAN) 5 MG tablet Take 1 tablet an hour before starting a GoLytely bowel prep. Take half the prep over 4 hours Repeat this process the following day. (Patient not taking: Reported on 09/08/2023)   RABEprazole (ACIPHEX) 20 MG tablet Take 20 mg by mouth daily as needed (acid reflux). (Patient not taking: Reported on 09/08/2023)   Tenapanor HCl (IBSRELA) 50 MG TABS Take 1 tablet by mouth daily. (Patient not taking: Reported on 09/08/2023)   Vibegron 75 MG TABS Take 1 tablet (75 mg total) by mouth daily. (Patient not taking: Reported on 09/08/2023)   No facility-administered encounter medications on file as of 09/08/2023.    Allergies (verified) Amitiza [lubiprostone], Morphine, Effexor [venlafaxine], and Nsaids   History: Past Medical History:  Diagnosis Date   Anemia, unspecified    Anxiety    Arthritis    "about q joint i've got" (07/14/2016)   Benign neoplasm of colon 12/2007   Hyperplastic colon polyps   CAD (coronary artery disease)    minimal catheterization, October 2008    Cellulitis    Chest pain    with stress   Childhood asthma    Chronic low back pain    Constipation    and diarrhea chronic..Dr. Barnet Pall   Depression    Diverticulosis of colon (without mention of hemorrhage)    DVT (deep venous thrombosis) (HCC) 1990s   LLE   Family history of adverse reaction to anesthesia    "daughter died having her 1st child cause she got too much anesthesia"    GERD (gastroesophageal reflux disease)    History of blood transfusion 01/2008   "related to hip OR"   Homocystinemia    signif elevation in the past...plan folic acid, B6, B12   Hypopotassemia    Hypotension, unspecified    Leg pain    Lymphoproliferative disease (HCC)  disorder in the past??   Methadone adverse reaction    for chronic leg and back pain   Other pulmonary embolism and infarction 2008   Significant 2008 and coumadin therapy RV dysfunction...echo...2008..EF 50%..right ventricle markedly dilated w marked right ventricular dysfunc and moder tricuspid regurg/echo..March 2010, Ef 50%, mild dilation of right ventricule w mild decrease right ventric function    Peripheral vascular disease (HCC)    Rectus sheath hematoma 07/13/2012   On coumadin    Right bundle branch block    intermittent   Thyroid disease    Tobacco use disorder    Urinary incontinence    Venous insufficiency (chronic) (peripheral)    Patient's legs were wrapped 2013   Past Surgical History:  Procedure Laterality Date   CARDIAC CATHETERIZATION  05/2007   Hattie Perch 12/08/2010   CHOLECYSTECTOMY OPEN     COLONOSCOPY WITH PROPOFOL N/A 08/11/2017   Procedure: COLONOSCOPY WITH PROPOFOL;  Surgeon: Sherrilyn Rist, MD;  Location: [facility] ENDOSCOPY;  Service: Gastroenterology;  Laterality: N/A;   COLONOSCOPY WITH PROPOFOL N/A 06/16/2022   Procedure: COLONOSCOPY WITH PROPOFOL;  Surgeon: Sherrilyn Rist, MD;  Location: [facility] ENDOSCOPY;  Service: Gastroenterology;  Laterality: N/A;   GASTRIC BYPASS  1980s   JOINT REPLACEMENT      KNEE ARTHROSCOPY Left 05/2001   Hattie Perch 12/21/2010   POLYPECTOMY  06/16/2022   Procedure: POLYPECTOMY;  Surgeon: Sherrilyn Rist, MD;  Location: [facility] ENDOSCOPY;  Service: Gastroenterology;;   SHOULDER ARTHROSCOPY W/ ROTATOR CUFF REPAIR Right 08/2002   Hattie Perch 12/21/2010   TONSILLECTOMY     TOTAL HIP ARTHROPLASTY Right 01/2008   Family History  Problem Relation Age of Onset   Heart disease Mother    Heart disease Father    Crohn's disease Sister    Osteoporosis Sister        brittle bones   Lung cancer Maternal Grandfather    Other Paternal Grandfather        broken heart   Other Daughter        drug overdose   Social History   Socioeconomic History   Marital status: Divorced    Spouse name: Not on file   Number of children: 1   Years of education: Not on file   Highest education level: Not on file  Occupational History   Occupation: Disabled    Employer: UNEMPLOYED  Tobacco Use   Smoking status: Former    Current packs/day: 0.00    Average packs/day: 0.1 packs/day for 27.0 years (2.7 ttl pk-yrs)    Types: Cigarettes    Start date: 06/08/1984    Quit date: 06/09/2011    Years since quitting: 12.2   Smokeless tobacco: Never  Vaping Use   Vaping status: Never Used  Substance and Sexual Activity   Alcohol use: No    Alcohol/week: 0.0 standard drinks of alcohol    Comment: 07/14/2016 "nothing since the early 1990s"   Drug use: No   Sexual activity: Not Currently  Other Topics Concern   Not on file  Social History Narrative      Lives alone   Social Drivers of Health   Financial Resource Strain: Low Risk  (09/08/2023)   Overall Financial Resource Strain (CARDIA)    Difficulty of Paying Living Expenses: Not very hard  Food Insecurity: No Food Insecurity (09/08/2023)   Hunger Vital Sign    Worried About Running Out of Food in the Last Year: Never true    Ran Out of Food  in the Last Year: Never true  Transportation Needs: Unmet Transportation Needs (09/08/2023)    PRAPARE - Transportation    Lack of Transportation (Medical): Yes    Lack of Transportation (Non-Medical): Yes  Physical Activity: Inactive (09/08/2023)   Exercise Vital Sign    Days of Exercise per Week: 0 days    Minutes of Exercise per Session: 0 min  Stress: Stress Concern Present (09/08/2023)   [employer] of Occupational Health - Occupational Stress Questionnaire    Feeling of Stress : Rather much  Social Connections: Socially Isolated (09/08/2023)   Social Connection and Isolation Panel [NHANES]    Frequency of Communication with Friends and Family: More than three times a week    Frequency of Social Gatherings with Friends and Family: More than three times a week    Attends Religious Services: Never    Database administrator or Organizations: No    Attends Engineer, structural: Never    Marital Status: Divorced    Tobacco Counseling Counseling given: Not Answered   Clinical Intake:  Pre-visit preparation completed: Yes  Pain : 0-10 Pain Score: 6  Pain Type: Chronic pain Pain Descriptors / Indicators: Discomfort, Aching Pain Onset: More than a month ago Pain Frequency: Constant     BMI - recorded: 54.25 Nutritional Status: BMI > 30  Obese Nutritional Risks: None  How often do you need to have someone help you when you read instructions, pamphlets, or other written materials from your doctor or pharmacy?: 1 - Never     Information entered by :: Trev Boley, RMA   Activities of Daily Living    09/08/2023   10:10 AM 10/18/2022    1:55 PM  In your present state of health, do you have any difficulty performing the following activities:  Hearing? 1 0  Comment some   Vision? 0 0  Difficulty concentrating or making decisions? 0 0  Walking or climbing stairs? 0 0  Dressing or bathing? 0 0  Doing errands, shopping? 1 0  Comment need a different insurance for Facilities manager and eating ? N N  Using the Toilet? N N  In the past  six months, have you accidently leaked urine? Y Y  Do you have problems with loss of bowel control? N N  Managing your Medications? N N  Managing your Finances? N N  Housekeeping or managing your Housekeeping? N N    Patient Care Team: Pincus Sanes, MD as PCP - General (Internal Medicine) Marykay Lex, MD as PCP - Cardiology (Cardiology) Alecia Lemming, MD as Referring Physician (Neurosurgery) Nadara Mustard, MD as Consulting Physician (Orthopedic Surgery) Esaw Dace, MD as Attending Physician (Urology) Myrtie Neither Andreas Blower, MD as Consulting Physician (Gastroenterology) Marguerita Beards, MD as Consulting Physician (Obstetrics and Gynecology)  Indicate any recent Medical Services you may have received from other than [facility] providers in the past year (date may be approximate).     Assessment:   This is a routine wellness examination for [place].  Hearing/Vision screen Hearing Screening - Comments:: Denies hearing difficulties   Vision Screening - Comments:: Wears eyeglasses   Goals Addressed   None   Depression Screen    09/08/2023   10:32 AM 10/18/2022    1:56 PM 06/01/2022    8:59 AM 05/18/2022    1:32 PM 05/18/2022    1:24 PM 01/21/2022    3:36 PM 10/11/2021    2:45 PM  PHQ 2/9  Scores  PHQ - 2 Score 1 3 3 2  0 2 0  PHQ- 9 Score 1 5 4 6  7      Fall Risk    09/08/2023   10:29 AM 10/18/2022    1:49 PM 06/01/2022    8:59 AM 05/18/2022    1:24 PM 04/01/2022    2:22 PM  Fall Risk   Falls in the past year? 0 0 0 0 0  Number falls in past yr: 0 0 0 0 0  Injury with Fall? 0 0 0 0 0  Risk for fall due to : No Fall Risks No Fall Risks  No Fall Risks Orthopedic patient  Follow up Falls prevention discussed;Falls evaluation completed Falls prevention discussed Falls evaluation completed Falls evaluation completed Falls evaluation completed    MEDICARE RISK AT HOME: Medicare Risk at Home Any stairs in or around the home?: No Home free of loose throw rugs in  walkways, pet beds, electrical cords, etc?: Yes Adequate lighting in your home to reduce risk of falls?: Yes Life alert?: No Use of a cane, walker or w/c?: Yes Grab bars in the bathroom?: Yes Shower chair or bench in shower?: Yes Elevated toilet seat or a handicapped toilet?: No  TIMED UP AND GO:  Was the test performed?  No    Cognitive Function:        09/08/2023   10:11 AM 10/18/2022    1:51 PM  6CIT Screen  What Year? 0 points 0 points  What month? 0 points 0 points  What time? 0 points 0 points  Count back from 20 0 points 0 points  Months in reverse 0 points 0 points  Repeat phrase 0 points 0 points  Total Score 0 points 0 points    Immunizations Immunization History  Administered Date(s) Administered   Fluad Quad(high Dose 65+) 05/24/2019, 05/26/2020, 06/01/2022, 04/27/2023   Influenza Split 04/22/2011   Influenza, High Dose Seasonal PF 04/17/2018   Influenza,inj,Quad PF,6+ Mos 06/03/2013, 06/10/2014, 07/15/2016, 04/12/2017   Influenza-Unspecified 06/30/2021   PFIZER Comirnaty(Gray Top)Covid-19 Tri-Sucrose Vaccine 02/27/2021, 06/04/2022   PFIZER(Purple Top)SARS-COV-2 Vaccination 09/14/2019, 10/05/2019, 02/27/2021   Pfizer Covid-19 Vaccine Bivalent Booster 92yrs & up 04/27/2023   Pneumococcal Conjugate-13 06/10/2014   Pneumococcal Polysaccharide-23 05/24/2019   Respiratory Syncytial Virus Vaccine,Recomb Aduvanted(Arexvy) 06/04/2022, 04/27/2023   Tdap 06/22/2012   Unspecified SARS-COV-2 Vaccination 09/14/2019, 10/05/2019, 07/28/2020   Zoster Recombinant(Shingrix) 06/04/2022    TDAP status: Due, Education has been provided regarding the importance of this vaccine. Advised may receive this vaccine at local pharmacy or Health Dept. Aware to provide a copy of the vaccination record if obtained from local pharmacy or Health Dept. Verbalized acceptance and understanding.  Flu Vaccine status: Up to date  Pneumococcal vaccine status: Up to date  Covid-19 vaccine  status: Completed vaccines  Qualifies for Shingles Vaccine? Yes   Zostavax completed Yes   Shingrix Completed?: Yes  Screening Tests Health Maintenance  Topic Date Due   COVID-19 Vaccine (10 - 2024-25 season) 06/22/2023   MAMMOGRAM  09/08/2023   DTaP/Tdap/Td (2 - Td or Tdap) 06/06/2024 (Originally 06/22/2022)   Medicare Annual Wellness (AWV)  09/07/2024   DEXA SCAN  03/28/2025   Colonoscopy  06/17/2027   Pneumonia Vaccine 61+ Years old  Completed   INFLUENZA VACCINE  Completed   Hepatitis C Screening  Completed   HPV VACCINES  Aged Out   Fecal DNA (Cologuard)  Discontinued   Zoster Vaccines- Shingrix  Discontinued    Health Maintenance  Health  Maintenance Due  Topic Date Due   COVID-19 Vaccine (10 - 2024-25 season) 06/22/2023   MAMMOGRAM  09/08/2023    Colorectal cancer screening: Type of screening: Colonoscopy. Completed 06/16/2022. Repeat every 5 years  Mammogram status: Completed 09/07/2021. Repeat every year  Bone Density status: Completed 03/28/2022. Results reflect: Bone density results: OSTEOPENIA. Repeat every 2 years.  Lung Cancer Screening: (Low Dose CT Chest recommended if Age 52-80 years, 20 pack-year currently smoking OR have quit w/in 15years.) does not qualify.   Lung Cancer Screening Referral: N/A  Additional Screening:  Hepatitis C Screening: does qualify; Completed 06/10/2014  Vision Screening: Recommended annual ophthalmology exams for early detection of glaucoma and other disorders of the eye. Is the patient up to date with their annual eye exam?  No  Who is the provider or what is the name of the office in which the patient attends annual eye exams? Dr. Hulda Marin If pt is not established with a provider, would they like to be referred to a provider to establish care? No .   Dental Screening: Recommended annual dental exams for proper oral hygiene   Community Resource Referral / Chronic Care Management: CRR required this visit?  No   CCM  required this visit?  No     Plan:     I have personally reviewed and noted the following in the patient's chart:   Medical and social history Use of alcohol, tobacco or illicit drugs  Current medications and supplements including opioid prescriptions. Patient is currently taking opioid prescriptions. Information provided to patient regarding non-opioid alternatives. Patient advised to discuss non-opioid treatment plan with their provider. Functional ability and status Nutritional status Physical activity Advanced directives List of other physicians Hospitalizations, surgeries, and ER visits in previous 12 months Vitals Screenings to include cognitive, depression, and falls Referrals and appointments  In addition, I have reviewed and discussed with patient certain preventive protocols, quality metrics, and best practice recommendations. A written personalized care plan for preventive services as well as general preventive health recommendations were provided to patient.     Prentis Langdon L Gregor Dershem, CMA   09/08/2023   After Visit Summary: (MyChart) Due to this being a telephonic visit, the after visit summary with patients personalized plan was offered to patient via MyChart   Nurse Notes: Patient is due for a Tdap.  She stated that she is scheduled for a mammogram in March 2024.  Patient had no concerns to address today.

## 2023-09-08 NOTE — Patient Instructions (Signed)
[name] , Thank you for taking time to come for your Medicare Wellness Visit. I appreciate your ongoing commitment to your health goals. Please review the following plan we discussed and let me know if I can assist you in the future.   Referrals/Orders/Follow-Ups/Clinician Recommendations: It was nice talking to you today.  You are due for a Tetanus vaccine.  Each day, aim for 6 glasses of water, plenty of protein in your diet and try to get up and walk/ stretch every hour for 5-10 minutes at a time.    This is a list of the screening recommended for you and due dates:  Health Maintenance  Topic Date Due   COVID-19 Vaccine (10 - 2024-25 season) 06/22/2023   Mammogram  09/08/2023   Medicare Annual Wellness Visit  10/18/2023   DTaP/Tdap/Td vaccine (2 - Td or Tdap) 06/06/2024*   DEXA scan (bone density measurement)  03/28/2025   Colon Cancer Screening  06/17/2027   Pneumonia Vaccine  Completed   Flu Shot  Completed   Hepatitis C Screening  Completed   HPV Vaccine  Aged Out   Cologuard (Stool DNA test)  Discontinued   Zoster (Shingles) Vaccine  Discontinued  *Topic was postponed. The date shown is not the original due date.    Advanced directives: (Copy Requested) Please bring a copy of your health care power of attorney and living will to the office to be added to your chart at your convenience.  Next Medicare Annual Wellness Visit scheduled for next year: Yes

## 2023-09-14 ENCOUNTER — Other Ambulatory Visit (HOSPITAL_COMMUNITY): Payer: Self-pay

## 2023-09-20 ENCOUNTER — Ambulatory Visit (INDEPENDENT_AMBULATORY_CARE_PROVIDER_SITE_OTHER): Payer: Medicare HMO

## 2023-09-20 DIAGNOSIS — Z7901 Long term (current) use of anticoagulants: Secondary | ICD-10-CM

## 2023-09-20 LAB — POCT INR: INR: 3.8 — AB (ref 2.0–3.0)

## 2023-09-20 NOTE — Progress Notes (Signed)
Checks INR at home, received result of 3.8 from [employer] portal today. Hold dose today and then change weekly dose to take 1 tablet daily except take 1 1/2 tablets on Sunday. Recheck in 1 week, on 09/27/23. LVM and advised of dosing and recheck and to contact coumadin clinic if any changes.

## 2023-09-20 NOTE — Patient Instructions (Addendum)
Pre visit review using our clinic review tool, if applicable. No additional management support is needed unless otherwise documented below in the visit note.  Hold dose today and then change weekly dose to take 1 tablet daily except take 1 1/2 tablets on Sunday. Recheck in 1 week, on 09/27/23.

## 2023-09-25 ENCOUNTER — Ambulatory Visit
Admission: RE | Admit: 2023-09-25 | Discharge: 2023-09-25 | Disposition: A | Discharge status: Home / Self Care | Payer: Medicare HMO | Source: Ambulatory Visit | Attending: Acute Care | Admitting: Acute Care

## 2023-09-25 DIAGNOSIS — Z122 Encounter for screening for malignant neoplasm of respiratory organs: Secondary | ICD-10-CM

## 2023-09-25 DIAGNOSIS — Z87891 Personal history of nicotine dependence: Secondary | ICD-10-CM

## 2023-09-26 ENCOUNTER — Other Ambulatory Visit (HOSPITAL_COMMUNITY): Payer: Self-pay

## 2023-09-27 ENCOUNTER — Ambulatory Visit (INDEPENDENT_AMBULATORY_CARE_PROVIDER_SITE_OTHER): Payer: Medicare HMO

## 2023-09-27 DIAGNOSIS — Z7901 Long term (current) use of anticoagulants: Secondary | ICD-10-CM | POA: Diagnosis not present

## 2023-09-27 LAB — POCT INR: INR: 2.3 (ref 2.0–3.0)

## 2023-09-27 NOTE — Progress Notes (Cosign Needed Addendum)
 Checks INR at home, received result of 2.3 from [employer] portal today. Continue 1 tablet daily except take 1 1/2 tablets on Sunday. Recheck in 2 week. Contacted pt and advised of dosing and recheck date. Pt verbalized understanding.  Medical screening examination/treatment/procedure(s) were performed by non-physician practitioner and as supervising physician I was immediately available for consultation/collaboration.  I agree with above. Jacinta Shoe, MD

## 2023-09-27 NOTE — Patient Instructions (Addendum)
 Pre visit review using our clinic review tool, if applicable. No additional management support is needed unless otherwise documented below in the visit note.  Continue 1 tablet daily except take 1 1/2 tablets on Sunday. Recheck in 2 week.

## 2023-10-04 ENCOUNTER — Encounter: Payer: Self-pay | Admitting: Internal Medicine

## 2023-10-05 ENCOUNTER — Other Ambulatory Visit (HOSPITAL_COMMUNITY): Payer: Self-pay

## 2023-10-05 MED ORDER — METHADONE HCL 10 MG PO TABS
10.0000 mg | ORAL_TABLET | Freq: Three times a day (TID) | ORAL | 0 refills | Status: DC | PRN
  Filled 2023-10-05 – 2023-10-06 (×3): qty 180, 30d supply, fill #0

## 2023-10-06 ENCOUNTER — Other Ambulatory Visit (HOSPITAL_COMMUNITY): Payer: Self-pay

## 2023-10-10 ENCOUNTER — Other Ambulatory Visit (HOSPITAL_COMMUNITY): Payer: Self-pay

## 2023-10-11 ENCOUNTER — Ambulatory Visit (INDEPENDENT_AMBULATORY_CARE_PROVIDER_SITE_OTHER): Payer: Self-pay

## 2023-10-11 ENCOUNTER — Other Ambulatory Visit: Payer: Self-pay

## 2023-10-11 ENCOUNTER — Other Ambulatory Visit (HOSPITAL_COMMUNITY): Payer: Self-pay

## 2023-10-11 DIAGNOSIS — Z7901 Long term (current) use of anticoagulants: Secondary | ICD-10-CM | POA: Diagnosis not present

## 2023-10-11 DIAGNOSIS — Z86718 Personal history of other venous thrombosis and embolism: Secondary | ICD-10-CM | POA: Diagnosis not present

## 2023-10-11 LAB — POCT INR: INR: 2.6 (ref 2.0–3.0)

## 2023-10-11 NOTE — Patient Instructions (Addendum)
 Pre visit review using our clinic review tool, if applicable. No additional management support is needed unless otherwise documented below in the visit note.  Continue 1 tablet daily except take 1 1/2 tablets on Sunday. Recheck in 2 week.

## 2023-10-11 NOTE — Progress Notes (Signed)
 I have reviewed and agree with note, evaluation, plan.   Tana Conch, MD

## 2023-10-11 NOTE — Progress Notes (Signed)
 Checks INR at home, received result of 2.6 from [employer] portal today. Continue 1 tablet daily except take 1 1/2 tablets on Sunday. Recheck in 2 week. Contacted pt and advised of dosing and recheck date. Pt verbalized understanding.

## 2023-10-14 ENCOUNTER — Other Ambulatory Visit (HOSPITAL_COMMUNITY): Payer: Self-pay

## 2023-10-16 ENCOUNTER — Other Ambulatory Visit (HOSPITAL_COMMUNITY): Payer: Self-pay

## 2023-10-16 ENCOUNTER — Other Ambulatory Visit: Payer: Self-pay

## 2023-10-16 DIAGNOSIS — Z87891 Personal history of nicotine dependence: Secondary | ICD-10-CM

## 2023-10-16 DIAGNOSIS — Z122 Encounter for screening for malignant neoplasm of respiratory organs: Secondary | ICD-10-CM

## 2023-10-17 ENCOUNTER — Other Ambulatory Visit: Payer: Self-pay

## 2023-10-17 ENCOUNTER — Other Ambulatory Visit (HOSPITAL_COMMUNITY): Payer: Self-pay

## 2023-10-19 ENCOUNTER — Other Ambulatory Visit (HOSPITAL_COMMUNITY): Payer: Self-pay

## 2023-10-19 MED ORDER — HYDROMORPHONE HCL 4 MG PO TABS
4.0000 mg | ORAL_TABLET | Freq: Four times a day (QID) | ORAL | 0 refills | Status: DC | PRN
Start: 2023-10-19 — End: 2023-12-16
  Filled 2023-11-16: qty 120, 30d supply, fill #0

## 2023-10-20 ENCOUNTER — Telehealth: Payer: Self-pay

## 2023-10-20 ENCOUNTER — Other Ambulatory Visit (HOSPITAL_COMMUNITY): Payer: Self-pay

## 2023-10-20 NOTE — Telephone Encounter (Signed)
 Received INR home testing order from [employer]. Pt has used this company for all of her home INR testing in the past. They need a new order for the next year for supplies. Completed the paperwork and awaiting provider signature.

## 2023-10-20 NOTE — Telephone Encounter (Signed)
Order signed by PCP and faxed.

## 2023-10-25 ENCOUNTER — Ambulatory Visit (INDEPENDENT_AMBULATORY_CARE_PROVIDER_SITE_OTHER): Payer: Self-pay

## 2023-10-25 DIAGNOSIS — Z7901 Long term (current) use of anticoagulants: Secondary | ICD-10-CM

## 2023-10-25 LAB — POCT INR: INR: 2.7 (ref 2.0–3.0)

## 2023-10-25 NOTE — Progress Notes (Signed)
 I have reviewed and agree with note, evaluation, plan.   Tana Conch, MD

## 2023-10-25 NOTE — Patient Instructions (Addendum)
 Pre visit review using our clinic review tool, if applicable. No additional management support is needed unless otherwise documented below in the visit note.  Continue 1 tablet daily except take 1 1/2 tablets on Sunday. Recheck in 2 week.

## 2023-10-25 NOTE — Progress Notes (Signed)
 Checks INR at home, received result of 2.7 from [employer] portal today. Continue 1 tablet daily except take 1 1/2 tablets on Sunday. Recheck in 2 week.  LVM with dosing and recheck date.

## 2023-11-04 ENCOUNTER — Other Ambulatory Visit (HOSPITAL_COMMUNITY): Payer: Self-pay

## 2023-11-04 MED ORDER — METHADONE HCL 10 MG PO TABS
10.0000 mg | ORAL_TABLET | Freq: Three times a day (TID) | ORAL | 0 refills | Status: DC | PRN
  Filled 2023-11-04 (×2): qty 180, 30d supply, fill #0

## 2023-11-06 ENCOUNTER — Other Ambulatory Visit (HOSPITAL_COMMUNITY): Payer: Self-pay

## 2023-11-06 DIAGNOSIS — Z1231 Encounter for screening mammogram for malignant neoplasm of breast: Secondary | ICD-10-CM | POA: Diagnosis not present

## 2023-11-06 DIAGNOSIS — Z1151 Encounter for screening for human papillomavirus (HPV): Secondary | ICD-10-CM | POA: Diagnosis not present

## 2023-11-06 DIAGNOSIS — R32 Unspecified urinary incontinence: Secondary | ICD-10-CM | POA: Diagnosis not present

## 2023-11-06 DIAGNOSIS — Z6841 Body Mass Index (BMI) 40.0 and over, adult: Secondary | ICD-10-CM | POA: Diagnosis not present

## 2023-11-06 DIAGNOSIS — M810 Age-related osteoporosis without current pathological fracture: Secondary | ICD-10-CM | POA: Diagnosis not present

## 2023-11-06 DIAGNOSIS — Z124 Encounter for screening for malignant neoplasm of cervix: Secondary | ICD-10-CM | POA: Diagnosis not present

## 2023-11-06 LAB — HM MAMMOGRAPHY

## 2023-11-08 ENCOUNTER — Other Ambulatory Visit: Payer: Self-pay | Admitting: Obstetrics and Gynecology

## 2023-11-08 ENCOUNTER — Ambulatory Visit (INDEPENDENT_AMBULATORY_CARE_PROVIDER_SITE_OTHER): Payer: Self-pay

## 2023-11-08 ENCOUNTER — Encounter: Payer: Self-pay | Admitting: Obstetrics and Gynecology

## 2023-11-08 ENCOUNTER — Encounter: Payer: Self-pay | Admitting: Internal Medicine

## 2023-11-08 DIAGNOSIS — Z7901 Long term (current) use of anticoagulants: Secondary | ICD-10-CM | POA: Diagnosis not present

## 2023-11-08 DIAGNOSIS — R928 Other abnormal and inconclusive findings on diagnostic imaging of breast: Secondary | ICD-10-CM

## 2023-11-08 LAB — POCT INR: INR: 1.8 — AB (ref 2.0–3.0)

## 2023-11-08 NOTE — Patient Instructions (Addendum)
 Pre visit review using our clinic review tool, if applicable. No additional management support is needed unless otherwise documented below in the visit note.  Increase dose today to take 1 1/2 tablets and then continue 1 tablet daily except take 1 1/2 tablets on Sunday. Recheck in 2 week.

## 2023-11-08 NOTE — Progress Notes (Signed)
 Checks INR at home, received result of 1.8 from [employer] portal today. Increase dose today to take 1 1/2 tablets and then continue 1 tablet daily except take 1 1/2 tablets on Sunday. Recheck in 2 week. LVM with dosing and recheck date.

## 2023-11-16 ENCOUNTER — Other Ambulatory Visit (HOSPITAL_COMMUNITY): Payer: Self-pay

## 2023-11-16 ENCOUNTER — Ambulatory Visit
Admission: RE | Admit: 2023-11-16 | Discharge: 2023-11-16 | Disposition: A | Discharge status: Home / Self Care | Source: Ambulatory Visit | Attending: Obstetrics and Gynecology | Admitting: Obstetrics and Gynecology

## 2023-11-16 DIAGNOSIS — R928 Other abnormal and inconclusive findings on diagnostic imaging of breast: Secondary | ICD-10-CM

## 2023-11-16 DIAGNOSIS — N6001 Solitary cyst of right breast: Secondary | ICD-10-CM | POA: Diagnosis not present

## 2023-11-22 ENCOUNTER — Other Ambulatory Visit: Payer: Self-pay | Admitting: Gastroenterology

## 2023-11-22 ENCOUNTER — Other Ambulatory Visit (HOSPITAL_COMMUNITY): Payer: Self-pay

## 2023-11-22 ENCOUNTER — Ambulatory Visit (INDEPENDENT_AMBULATORY_CARE_PROVIDER_SITE_OTHER): Payer: Self-pay

## 2023-11-22 DIAGNOSIS — Z7901 Long term (current) use of anticoagulants: Secondary | ICD-10-CM

## 2023-11-22 DIAGNOSIS — K581 Irritable bowel syndrome with constipation: Secondary | ICD-10-CM

## 2023-11-22 DIAGNOSIS — K5909 Other constipation: Secondary | ICD-10-CM

## 2023-11-22 LAB — POCT INR: INR: 1.7 — AB (ref 2.0–3.0)

## 2023-11-22 NOTE — Progress Notes (Signed)
 Checks INR at home, received result of 1.7 from [employer] portal today. Pt missed dose 4 days ago. Due to the missed dose will only advise to take booster dose today and not make any weekly change in dosing.  Increase dose today to take 1 1/2 tablets and then continue 1 tablet daily except take 1 1/2 tablets on Sunday. Recheck in 1 week. Advised of dosing and recheck date. Pt verbalized understanding.

## 2023-11-22 NOTE — Patient Instructions (Addendum)
 Pre visit review using our clinic review tool, if applicable. No additional management support is needed unless otherwise documented below in the visit note.  Increase dose today to take 1 1/2 tablets and then continue 1 tablet daily except take 1 1/2 tablets on Sunday. Recheck in 1 week.

## 2023-11-23 ENCOUNTER — Other Ambulatory Visit (HOSPITAL_COMMUNITY): Payer: Self-pay

## 2023-11-23 MED ORDER — IBSRELA 50 MG PO TABS
50.0000 mg | ORAL_TABLET | Freq: Every day | ORAL | 2 refills | Status: DC
Start: 2023-11-23 — End: 2024-04-10
  Filled 2023-11-23 – 2023-11-27 (×2): qty 30, 30d supply, fill #0
  Filled 2023-11-27: qty 60, 30d supply, fill #0
  Filled 2024-04-10: qty 60, 30d supply, fill #1

## 2023-11-25 ENCOUNTER — Other Ambulatory Visit (HOSPITAL_BASED_OUTPATIENT_CLINIC_OR_DEPARTMENT_OTHER): Payer: Self-pay

## 2023-11-27 ENCOUNTER — Other Ambulatory Visit: Payer: Self-pay

## 2023-11-27 ENCOUNTER — Other Ambulatory Visit (HOSPITAL_COMMUNITY): Payer: Self-pay

## 2023-11-27 ENCOUNTER — Telehealth: Payer: Self-pay

## 2023-11-27 NOTE — Telephone Encounter (Signed)
 Pharmacy Patient Advocate Encounter   Received notification from CoverMyMeds that prior authorization for Ibsrela  50MG  tablets is required/requested.   Insurance verification completed.   The patient is insured through [employer] .   Per test claim: The current 30 day co-pay is, $0.00.  No PA needed at this time. This test claim was processed through [facility]- copay amounts may vary at other pharmacies due to pharmacy/plan contracts, or as the patient moves through the different stages of their insurance plan.

## 2023-11-28 ENCOUNTER — Other Ambulatory Visit: Payer: Self-pay

## 2023-11-28 ENCOUNTER — Other Ambulatory Visit (HOSPITAL_COMMUNITY): Payer: Self-pay

## 2023-11-29 ENCOUNTER — Ambulatory Visit (INDEPENDENT_AMBULATORY_CARE_PROVIDER_SITE_OTHER): Payer: Self-pay

## 2023-11-29 DIAGNOSIS — Z86718 Personal history of other venous thrombosis and embolism: Secondary | ICD-10-CM | POA: Diagnosis not present

## 2023-11-29 DIAGNOSIS — Z7901 Long term (current) use of anticoagulants: Secondary | ICD-10-CM

## 2023-11-29 LAB — POCT INR: INR: 2.1 (ref 2.0–3.0)

## 2023-11-29 NOTE — Patient Instructions (Addendum)
 Pre visit review using our clinic review tool, if applicable. No additional management support is needed unless otherwise documented below in the visit note.  Continue 1 tablet daily except take 1 1/2 tablets on Sunday. Recheck in 2 week.

## 2023-11-29 NOTE — Progress Notes (Signed)
 Checks INR at home, received result of 2.1 from [employer] portal today. Continue 1 tablet daily except take 1 1/2 tablets on Sunday. Recheck in 2 week. LVM with instructions.

## 2023-11-30 ENCOUNTER — Other Ambulatory Visit (HOSPITAL_COMMUNITY): Payer: Self-pay

## 2023-11-30 ENCOUNTER — Other Ambulatory Visit: Payer: Self-pay

## 2023-12-02 ENCOUNTER — Other Ambulatory Visit (HOSPITAL_COMMUNITY): Payer: Self-pay

## 2023-12-04 ENCOUNTER — Other Ambulatory Visit (HOSPITAL_COMMUNITY): Payer: Self-pay

## 2023-12-04 ENCOUNTER — Other Ambulatory Visit: Payer: Self-pay

## 2023-12-04 MED ORDER — METHADONE HCL 10 MG PO TABS
10.0000 mg | ORAL_TABLET | Freq: Three times a day (TID) | ORAL | 0 refills | Status: DC | PRN
Start: 2023-10-19 — End: 2024-01-03
  Filled 2023-12-04 (×3): qty 180, 30d supply, fill #0

## 2023-12-11 ENCOUNTER — Encounter: Payer: Self-pay | Admitting: Internal Medicine

## 2023-12-13 ENCOUNTER — Ambulatory Visit (INDEPENDENT_AMBULATORY_CARE_PROVIDER_SITE_OTHER): Payer: Self-pay

## 2023-12-13 DIAGNOSIS — Z7901 Long term (current) use of anticoagulants: Secondary | ICD-10-CM

## 2023-12-13 DIAGNOSIS — Z86718 Personal history of other venous thrombosis and embolism: Secondary | ICD-10-CM | POA: Diagnosis not present

## 2023-12-13 LAB — POCT INR: INR: 2.2 (ref 2.0–3.0)

## 2023-12-13 NOTE — Patient Instructions (Addendum)
 Pre visit review using our clinic review tool, if applicable. No additional management support is needed unless otherwise documented below in the visit note.  Continue 1 tablet daily except take 1 1/2 tablets on Sunday. Recheck in 2 week.

## 2023-12-13 NOTE — Progress Notes (Signed)
 Checks INR at home, received result of 2.2 from [employer] portal today. Continue 1 tablet daily except take 1 1/2 tablets on Sunday. Recheck in 2 week. Contacted pt by phone and advised of dosing and recheck date. Pt verbalized understanding.

## 2023-12-14 NOTE — Progress Notes (Unsigned)
 Subjective:    Patient ID: [name], female    DOB: [birth year], 68 y.o.   MRN: [id number]      HPI [name] is here for  Chief Complaint  Patient presents with   Medical Management of Chronic Issues    Needs hospital bed, bedside commode; She also wants her right arm looked up because it hurts with certain ROM; Also wants her ears checked; SOB at night sometimes when she rolls over    She has an overactive bladder, bilateral knee osteoarthritis, chronic lower back pain following with pain management and neurosurgery, s/p hip replacment and limited mobility.    malnutrition, urinary incontinence, has diabetic neuropathy with altered sensory perception and has peripheral vascular disease.    She requires a semielectric hospital bed and therapeutic mattress for these reasons.  She needs to be able to reposition frequently in ways that are not feasible with an ordinary bed to alleviate her pain and prevent bedsores.  She has limited mobility   No falls.    She also requires toiletraise toilet seat.      Leg edma - need to elevate, chronic bac pain, incontience,  stay sin ebd most of the time.  Hx of bed sores.     Apin in epigastric region x months   Medications and allergies reviewed with patient and updated if appropriate.  Current Outpatient Medications on File Prior to Visit  Medication Sig Dispense Refill   bisacodyl (DULCOLAX) 5 MG EC tablet Take 30 mg by mouth daily as needed for moderate constipation.     calcium  carbonate (OS-CAL) 1250 (500 Ca) MG chewable tablet Chew by mouth.     estradiol  (ESTRACE ) 0.1 MG/GM vaginal cream Place 0.5g nightly for two weeks then twice a week after 30 g 11   Glycerin-Hypromellose-PEG 400 (DRY EYE RELIEF DROPS) 0.2-0.2-1 % SOLN Place 1 drop into both eyes as needed (dry eyes).     HYDROmorphone  (DILAUDID ) 4 MG tablet Take 1-2 tablets (4-8 mg total) by mouth every 6 (six) hours as needed for breakthrough pain. 120 tablet 0    HYDROmorphone  (DILAUDID ) 4 MG tablet Take 1 - 2 tablets (4 - 8mg  total) by mouth every 6 (six) hours as needed for breakthrough pain 30 tablet 0   HYDROmorphone  (DILAUDID ) 4 MG tablet Take 1 - 2 tablets (4 - 8 mg total) by mouth every 6 (six) hours as needed for breakthrough pain 120 tablet 0   HYDROmorphone  (DILAUDID ) 4 MG tablet Take 1 tablet (4 mg total) by mouth every 6 (six) hours as needed for breakthrough pain 120 tablet 0   HYDROmorphone  (DILAUDID ) 4 MG tablet Take 1 tablet (4 mg total) by mouth every 6 (six) hours as needed for breakthrough pain 120 tablet 0   HYDROmorphone  (DILAUDID ) 4 MG tablet Take 1 tablet (4 mg total) by mouth every 6 (six) hours as needed for breakthrough pain 120 tablet 0   HYDROmorphone  (DILAUDID ) 4 MG tablet Take 1 tablet (4 mg total) by mouth every 6 (six) hours as needed for breakthrough pain. 120 tablet 0   HYDROmorphone  (DILAUDID ) 4 MG tablet Take 1 tablet (4 mg total) by mouth every 6 (six) hours as needed for breakthrough pain. 120 tablet 0   hyoscyamine  (LEVSIN) 0.125 MG tablet Take 1 tablet by mouth 1-3 times a day as needed for intestinal spasms 15 tablet 0   levothyroxine  (SYNTHROID ) 200 MCG tablet Take 1 tablet (200 mcg total) by mouth daily before breakfast. 90 tablet 1  levothyroxine  (SYNTHROID ) 25 MCG tablet Take 1 tablet (25 mcg total) by mouth daily before breakfast. For total 225 mcg daily 90 tablet 1   methadone  (DOLOPHINE ) 10 MG tablet Take 1-2  tablets by mouth every 8 (eight) hours as needed for pain 180 tablet 0   methadone  (DOLOPHINE ) 10 MG tablet Take 1-2 tablets (10-20 mg total) by mouth every 8 (eight) hours as needed for pain 180 tablet 0   methadone  (DOLOPHINE ) 10 MG tablet Take 1-2 tablets (10-20 mg total) by mouth every 8 (eight) hours as needed for pain 180 tablet 0   methadone  (DOLOPHINE ) 10 MG tablet Take 1-2 tablets by mouth every 8 (eight) hours as needed for pain. 180 tablet 0   metoCLOPramide  (REGLAN ) 5 MG tablet Take 1 tablet an  hour before starting a GoLytely  bowel prep. Take half the prep over 4 hours Repeat this process the following day. 2 tablet 0   naloxone (NARCAN) nasal spray 4 mg/0.1 mL Place 1 spray into the nose once as needed (opioid overdose).     nystatin -triamcinolone  ointment (MYCOLOG) Apply 1 Application topically 2 (two) times daily as needed (irritation). 120 g 5   RABEprazole  (ACIPHEX ) 20 MG tablet Take 20 mg by mouth daily as needed (acid reflux).     silver  sulfADIAZINE  (SILVADENE ) 1 % cream Apply topically daily as directed 50 g 0   Skin Protectants, Misc. (INTERDRY 10"X36") SHEE UAD 30 each 5   Tenapanor  HCl (IBSRELA ) 50 MG TABS Take 1 tablet by mouth daily. 30 tablet 2   vitamin B-12 (CYANOCOBALAMIN ) 1000 MCG tablet Take 1 tablet (1,000 mcg total) by mouth daily.     warfarin (COUMADIN ) 5 MG tablet TAKE 1 TABLET BY MOUTH DAILY EXCEPT TAKE 1 1/2 TABLETS ON SUNDAYS AND WEDNESDAYS OR AS DIRECTED BY ANTICOAGULATION CLINIC 110 tablet 1   No current facility-administered medications on file prior to visit.    Review of Systems  Constitutional:  Negative for fever.  Respiratory:  Positive for shortness of breath.   Cardiovascular:  Positive for leg swelling. Negative for chest pain and palpitations.  Musculoskeletal:  Positive for back pain.  Neurological:  Negative for light-headedness and headaches.       Objective:   Vitals:   12/15/23 1534  BP: 112/70  Pulse: 63  Temp: 98.1 F (36.7 C)  SpO2: 96%   BP Readings from Last 3 Encounters:  12/15/23 112/70  06/07/23 114/72  04/05/23 110/60   Wt Readings from Last 3 Encounters:  12/15/23 (!) 340 lb (154.2 kg)  09/08/23 (!) 326 lb (147.9 kg)  06/07/23 (!) 326 lb (147.9 kg)   Body mass index is 56.58 kg/m.    Physical Exam         Assessment & Plan:    See Problem List for Assessment and Plan of chronic medical problems.

## 2023-12-15 ENCOUNTER — Other Ambulatory Visit: Payer: Self-pay

## 2023-12-15 ENCOUNTER — Encounter: Payer: Self-pay | Admitting: Internal Medicine

## 2023-12-15 ENCOUNTER — Other Ambulatory Visit (HOSPITAL_COMMUNITY): Payer: Self-pay

## 2023-12-15 ENCOUNTER — Ambulatory Visit (INDEPENDENT_AMBULATORY_CARE_PROVIDER_SITE_OTHER): Admitting: Internal Medicine

## 2023-12-15 VITALS — BP 112/70 | HR 63 | Temp 98.1°F | Ht 65.0 in | Wt 340.0 lb

## 2023-12-15 DIAGNOSIS — H9190 Unspecified hearing loss, unspecified ear: Secondary | ICD-10-CM | POA: Diagnosis not present

## 2023-12-15 DIAGNOSIS — I890 Lymphedema, not elsewhere classified: Secondary | ICD-10-CM

## 2023-12-15 DIAGNOSIS — N3281 Overactive bladder: Secondary | ICD-10-CM | POA: Diagnosis not present

## 2023-12-15 DIAGNOSIS — M545 Low back pain, unspecified: Secondary | ICD-10-CM

## 2023-12-15 DIAGNOSIS — I872 Venous insufficiency (chronic) (peripheral): Secondary | ICD-10-CM

## 2023-12-15 DIAGNOSIS — G8929 Other chronic pain: Secondary | ICD-10-CM | POA: Diagnosis not present

## 2023-12-15 DIAGNOSIS — M170 Bilateral primary osteoarthritis of knee: Secondary | ICD-10-CM

## 2023-12-15 MED ORDER — FUROSEMIDE 20 MG PO TABS
20.0000 mg | ORAL_TABLET | Freq: Every day | ORAL | 5 refills | Status: DC
  Filled 2023-12-15: qty 30, 30d supply, fill #0

## 2023-12-15 NOTE — Patient Instructions (Addendum)
     I will order a hospital bed and a raised toilet seat    Medications changes include :   lasix  20 mg daily

## 2023-12-16 ENCOUNTER — Other Ambulatory Visit (HOSPITAL_COMMUNITY): Payer: Self-pay

## 2023-12-16 DIAGNOSIS — H9190 Unspecified hearing loss, unspecified ear: Secondary | ICD-10-CM | POA: Insufficient documentation

## 2023-12-16 DIAGNOSIS — N3281 Overactive bladder: Secondary | ICD-10-CM | POA: Insufficient documentation

## 2023-12-16 MED ORDER — HYDROMORPHONE HCL 4 MG PO TABS
4.0000 mg | ORAL_TABLET | Freq: Four times a day (QID) | ORAL | 0 refills | Status: DC | PRN
  Filled 2023-12-16: qty 120, 30d supply, fill #0

## 2023-12-16 NOTE — Assessment & Plan Note (Signed)
 Chronic  With chronic lymphedema Tries to elevate them at night but legs down at night Not able to walk much Requires hospital bed to help elevate legs and prevent bed sores Start lasix  20 mg daily - may need to increase

## 2023-12-16 NOTE — Assessment & Plan Note (Signed)
 Chronic With urinary incontinence

## 2023-12-16 NOTE — Assessment & Plan Note (Addendum)
 Chronic Following with pain management/neurosurgery She is not a surgical candidate She has significant pain and decreased mobility with history of falls Requires hospital bed for frequent repositioning, prevention of bedsores since she is  in bed so much Requires raised toilet seat

## 2023-12-16 NOTE — Assessment & Plan Note (Signed)
 Chronic Severe in nature significantly affects her ability to walk Uses cane or rollator at home or scooter at stores Mobility very limited Unable to get up from toilet - needs a raised toilet seat Hospital medically necessary for multiple reasons - ordered

## 2023-12-16 NOTE — Assessment & Plan Note (Signed)
 Chronic  Tries to elevate them at night but legs down at night Not able to walk much Requires hospital bed to help elevate legs and prevent bed sores Start lasix  20 mg daily - may need to increase

## 2023-12-16 NOTE — Assessment & Plan Note (Signed)
 New No wax in ears Will need to see an audiologist for further evaluation

## 2023-12-21 ENCOUNTER — Other Ambulatory Visit (HOSPITAL_COMMUNITY): Payer: Self-pay

## 2023-12-21 ENCOUNTER — Other Ambulatory Visit: Payer: Self-pay

## 2023-12-21 MED ORDER — HYDROMORPHONE HCL 4 MG PO TABS: 4.0000 mg | ORAL_TABLET | Freq: Four times a day (QID) | ORAL | 0 refills | Status: DC | PRN

## 2023-12-21 MED ORDER — HYDROMORPHONE HCL 4 MG PO TABS
4.0000 mg | ORAL_TABLET | Freq: Four times a day (QID) | ORAL | 0 refills | Status: DC | PRN
Start: 2023-12-20 — End: 2024-06-07
  Filled 2023-12-21 – 2024-01-15 (×2): qty 120, 30d supply, fill #0

## 2023-12-22 ENCOUNTER — Other Ambulatory Visit (HOSPITAL_COMMUNITY): Payer: Self-pay

## 2023-12-27 ENCOUNTER — Ambulatory Visit (INDEPENDENT_AMBULATORY_CARE_PROVIDER_SITE_OTHER)

## 2023-12-27 DIAGNOSIS — Z7901 Long term (current) use of anticoagulants: Secondary | ICD-10-CM | POA: Diagnosis not present

## 2023-12-27 LAB — POCT INR: INR: 2 (ref 2.0–3.0)

## 2023-12-27 NOTE — Progress Notes (Signed)
 Checks INR at home, received result of 2.0 from [employer] portal today. Continue 1 tablet daily except take 1 1/2 tablets on Sunday. Recheck in 2 week. LVM with dosing and recheck date.

## 2023-12-27 NOTE — Progress Notes (Signed)
 I have reviewed and agree with note, evaluation, plan.   Tana Conch, MD

## 2023-12-27 NOTE — Patient Instructions (Addendum)
 Pre visit review using our clinic review tool, if applicable. No additional management support is needed unless otherwise documented below in the visit note.  Continue 1 tablet daily except take 1 1/2 tablets on Sunday. Recheck in 2 week.

## 2024-01-02 ENCOUNTER — Other Ambulatory Visit (HOSPITAL_COMMUNITY): Payer: Self-pay

## 2024-01-02 ENCOUNTER — Other Ambulatory Visit: Payer: Self-pay

## 2024-01-02 ENCOUNTER — Other Ambulatory Visit: Payer: Self-pay | Admitting: Internal Medicine

## 2024-01-02 MED ORDER — LEVOTHYROXINE SODIUM 25 MCG PO TABS
25.0000 ug | ORAL_TABLET | Freq: Every day | ORAL | 1 refills | Status: DC
  Filled 2024-01-02 – 2024-01-03 (×2): qty 90, 90d supply, fill #0
  Filled 2024-03-29: qty 90, 90d supply, fill #1

## 2024-01-02 MED ORDER — LEVOTHYROXINE SODIUM 200 MCG PO TABS
200.0000 ug | ORAL_TABLET | Freq: Every day | ORAL | 1 refills | Status: DC
  Filled 2024-01-02 – 2024-01-03 (×2): qty 90, 90d supply, fill #0
  Filled 2024-03-29: qty 90, 90d supply, fill #1

## 2024-01-03 ENCOUNTER — Other Ambulatory Visit (HOSPITAL_COMMUNITY): Payer: Self-pay

## 2024-01-03 ENCOUNTER — Other Ambulatory Visit: Payer: Self-pay

## 2024-01-03 MED ORDER — METHADONE HCL 10 MG PO TABS
10.0000 mg | ORAL_TABLET | Freq: Three times a day (TID) | ORAL | 0 refills | Status: DC | PRN
  Filled 2024-01-03: qty 180, 30d supply, fill #0

## 2024-01-10 ENCOUNTER — Ambulatory Visit (INDEPENDENT_AMBULATORY_CARE_PROVIDER_SITE_OTHER): Payer: Self-pay

## 2024-01-10 DIAGNOSIS — Z7901 Long term (current) use of anticoagulants: Secondary | ICD-10-CM | POA: Diagnosis not present

## 2024-01-10 LAB — POCT INR: INR: 2 (ref 2.0–3.0)

## 2024-01-10 NOTE — Progress Notes (Signed)
 I have reviewed and agree with note, evaluation, plan.   Tana Conch, MD

## 2024-01-10 NOTE — Patient Instructions (Addendum)
 Pre visit review using our clinic review tool, if applicable. No additional management support is needed unless otherwise documented below in the visit note.  Change weekly dose to take 1 tablet daily except take 1 1/2 tablets on Thursday and Sunday. Recheck in 2 week.

## 2024-01-10 NOTE — Progress Notes (Addendum)
 Checks INR at home, received result of 2.0 from [employer] portal today. INR has been at the lowest end of pt's range or subtherapeutic for the last 2 months. Will make a small change to move INR closer to goal of 2.5 Change weekly dose to take 1 tablet daily except take 1 1/2 tablets on Thursday and Sunday. Recheck in 2 week. LVM with dosing and recheck date. Pt returned call. Advised of dosing and recheck date. Pt verbalized understanding.

## 2024-01-13 ENCOUNTER — Other Ambulatory Visit: Payer: Self-pay | Admitting: Internal Medicine

## 2024-01-13 DIAGNOSIS — Z7901 Long term (current) use of anticoagulants: Secondary | ICD-10-CM

## 2024-01-15 ENCOUNTER — Other Ambulatory Visit (HOSPITAL_COMMUNITY): Payer: Self-pay

## 2024-01-15 ENCOUNTER — Other Ambulatory Visit: Payer: Self-pay

## 2024-01-15 MED ORDER — WARFARIN SODIUM 5 MG PO TABS
ORAL_TABLET | ORAL | 1 refills | Status: DC
  Filled 2024-01-15: qty 102, 90d supply, fill #0
  Filled 2024-04-10: qty 102, 90d supply, fill #1
  Filled 2024-07-29: qty 26, 24d supply, fill #2

## 2024-01-15 NOTE — Telephone Encounter (Signed)
 Pt LVM requesting refill of warfarin.  Pt is compliant with warfarin management and PCP apts.  Sent in refill of warfarin to requested pharmacy.

## 2024-01-16 ENCOUNTER — Other Ambulatory Visit (HOSPITAL_COMMUNITY): Payer: Self-pay

## 2024-01-16 ENCOUNTER — Other Ambulatory Visit: Payer: Self-pay | Admitting: Gastroenterology

## 2024-01-17 ENCOUNTER — Other Ambulatory Visit (HOSPITAL_COMMUNITY): Payer: Self-pay

## 2024-01-17 ENCOUNTER — Other Ambulatory Visit: Payer: Self-pay

## 2024-01-17 MED ORDER — HYOSCYAMINE SULFATE 0.125 MG PO TABS
ORAL_TABLET | ORAL | 0 refills | Status: DC
  Filled 2024-01-17: qty 15, 5d supply, fill #0

## 2024-01-24 ENCOUNTER — Telehealth: Payer: Self-pay

## 2024-01-24 ENCOUNTER — Ambulatory Visit (INDEPENDENT_AMBULATORY_CARE_PROVIDER_SITE_OTHER): Payer: Self-pay

## 2024-01-24 DIAGNOSIS — Z7901 Long term (current) use of anticoagulants: Secondary | ICD-10-CM | POA: Diagnosis not present

## 2024-01-24 LAB — POCT INR: INR: 2.7 (ref 2.0–3.0)

## 2024-01-24 NOTE — Progress Notes (Signed)
 Checks INR at home, received result of 2.7 from [employer] portal today. Continue 1 tablet daily except take 1 1/2 tablets on Thursday and Sunday. Recheck in 2 week. Contacted pt and advised of dosing and recheck date. Pt verbalized understanding.    Pt reported she has not heard from anyone concerning the hospital bed or raised toilet seat that PCP placed orders for at her apt on 5/9. Advised a msg will be sent to CMA to f/u with you and if she does not hear anything within a few days to send a mychart msg. Pt verbalized understanding.   Sent telephone encounter to CMA

## 2024-01-24 NOTE — Telephone Encounter (Signed)
 Contacted pt today for INR and warfarin dosing and she reported she has not been contacted yet about the order PCP placed for hospital bed and raised toilet seat on 12/12/23 Advised a msg will be sent to PCPs CMA and if she does not hear anything within a few days to send a mychart msg. Pt verbalized understanding.

## 2024-01-24 NOTE — Patient Instructions (Addendum)
 Pre visit review using our clinic review tool, if applicable. No additional management support is needed unless otherwise documented below in the visit note.  Continue 1 tablet daily except take 1 1/2 tablets on Thursday and Sunday. Recheck in 2 week.

## 2024-01-24 NOTE — Telephone Encounter (Signed)
 Follow up message sent to [employer] today.

## 2024-01-29 ENCOUNTER — Other Ambulatory Visit (HOSPITAL_COMMUNITY): Payer: Self-pay

## 2024-01-30 ENCOUNTER — Encounter: Payer: Self-pay | Admitting: Internal Medicine

## 2024-01-31 ENCOUNTER — Other Ambulatory Visit (HOSPITAL_COMMUNITY): Payer: Self-pay

## 2024-01-31 MED ORDER — METHADONE HCL 10 MG PO TABS
ORAL_TABLET | ORAL | 0 refills | Status: DC
  Filled 2024-01-31 – 2024-02-01 (×4): qty 180, 30d supply, fill #0

## 2024-02-01 ENCOUNTER — Other Ambulatory Visit (HOSPITAL_COMMUNITY): Payer: Self-pay

## 2024-02-01 ENCOUNTER — Other Ambulatory Visit: Payer: Self-pay

## 2024-02-02 ENCOUNTER — Encounter: Payer: Self-pay | Admitting: Internal Medicine

## 2024-02-05 ENCOUNTER — Other Ambulatory Visit: Payer: Self-pay | Admitting: Internal Medicine

## 2024-02-05 DIAGNOSIS — G8929 Other chronic pain: Secondary | ICD-10-CM

## 2024-02-05 DIAGNOSIS — M170 Bilateral primary osteoarthritis of knee: Secondary | ICD-10-CM

## 2024-02-05 DIAGNOSIS — N3281 Overactive bladder: Secondary | ICD-10-CM

## 2024-02-05 DIAGNOSIS — I872 Venous insufficiency (chronic) (peripheral): Secondary | ICD-10-CM

## 2024-02-05 DIAGNOSIS — I890 Lymphedema, not elsewhere classified: Secondary | ICD-10-CM

## 2024-02-07 ENCOUNTER — Encounter: Payer: Self-pay | Admitting: Internal Medicine

## 2024-02-07 ENCOUNTER — Ambulatory Visit (INDEPENDENT_AMBULATORY_CARE_PROVIDER_SITE_OTHER): Payer: Self-pay

## 2024-02-07 DIAGNOSIS — Z7901 Long term (current) use of anticoagulants: Secondary | ICD-10-CM

## 2024-02-07 LAB — POCT INR: INR: 2.1 (ref 2.0–3.0)

## 2024-02-07 NOTE — Patient Instructions (Addendum)
 Pre visit review using our clinic review tool, if applicable. No additional management support is needed unless otherwise documented below in the visit note.  Continue 1 tablet daily except take 1 1/2 tablets on Thursday and Sunday. Recheck in 2 week.

## 2024-02-07 NOTE — Progress Notes (Signed)
 Checks INR at home, received result of 2.1 from [employer] portal today. Continue 1 tablet daily except take 1 1/2 tablets on Thursday and Sunday. Recheck in 2 week. Contacted pt and advised of dosing and recheck date. Pt verbalized understanding.

## 2024-02-08 ENCOUNTER — Other Ambulatory Visit (HOSPITAL_COMMUNITY): Payer: Self-pay

## 2024-02-08 MED ORDER — HYDROMORPHONE HCL 4 MG PO TABS
4.0000 mg | ORAL_TABLET | Freq: Four times a day (QID) | ORAL | 0 refills | Status: DC | PRN
  Filled 2024-03-16: qty 120, 30d supply, fill #0

## 2024-02-08 MED ORDER — HYDROMORPHONE HCL 4 MG PO TABS
4.0000 mg | ORAL_TABLET | Freq: Four times a day (QID) | ORAL | 0 refills | Status: DC | PRN
  Filled 2024-02-14: qty 120, 30d supply, fill #0

## 2024-02-08 NOTE — Progress Notes (Signed)
 I have reviewed the patient's encounter and agree with the documentation.  Worth HERO. Kennyth, MD 02/08/2024 7:49 AM

## 2024-02-14 ENCOUNTER — Other Ambulatory Visit: Payer: Self-pay

## 2024-02-14 ENCOUNTER — Other Ambulatory Visit (HOSPITAL_COMMUNITY): Payer: Self-pay

## 2024-02-15 ENCOUNTER — Encounter: Payer: Self-pay | Admitting: Internal Medicine

## 2024-02-22 ENCOUNTER — Telehealth: Payer: Self-pay

## 2024-02-22 ENCOUNTER — Ambulatory Visit (INDEPENDENT_AMBULATORY_CARE_PROVIDER_SITE_OTHER): Payer: Self-pay

## 2024-02-22 DIAGNOSIS — Z7901 Long term (current) use of anticoagulants: Secondary | ICD-10-CM | POA: Diagnosis not present

## 2024-02-22 LAB — POCT INR: INR: 3.5 — AB (ref 2.0–3.0)

## 2024-02-22 NOTE — Patient Instructions (Addendum)
 Pre visit review using our clinic review tool, if applicable. No additional management support is needed unless otherwise documented below in the visit note.  Change weekly dose to take 1 tablet daily except take 1 1/2 tablets on  Sunday. Recheck in 2 week.

## 2024-02-22 NOTE — Telephone Encounter (Signed)
 Pt reports the company that provides her home INR testing supplies, [employer], has dropped her account due to not receiving an order. She reports they sent an order to the clinic but they said it was illegible and could not accept it.  Coumadin  clinic was not contacted for an order.   Advised pt a new order will be created and PCP will have to sign. PCP is out of the office for a couple of weeks. Pt has enough testing supplies to continue testing until reinstated.   Completed order and it is pending PCP signature.

## 2024-02-22 NOTE — Progress Notes (Signed)
 Checks INR at home, received result of 3.5 from [employer] portal today, but pt reports she tested last night. She held her warfarin last night due to supratherapeutic INR. Pt reports she has been under a lot of mental stress lately. This will increase INR in most pts. Pt denies any other changes. Change weekly dose to take 1 tablet daily except take 1 1/2 tablets on  Sunday. Recheck in 2 week. Contacted pt and advised of dosing and recheck date. Pt verbalized understanding.

## 2024-02-25 ENCOUNTER — Encounter: Payer: Self-pay | Admitting: Internal Medicine

## 2024-02-27 ENCOUNTER — Other Ambulatory Visit (HOSPITAL_COMMUNITY): Payer: Self-pay

## 2024-02-27 ENCOUNTER — Other Ambulatory Visit: Payer: Self-pay

## 2024-02-27 ENCOUNTER — Encounter: Payer: Self-pay | Admitting: Internal Medicine

## 2024-02-27 ENCOUNTER — Ambulatory Visit: Admitting: Internal Medicine

## 2024-02-27 VITALS — BP 110/64 | HR 66 | Temp 98.4°F | Ht 65.0 in

## 2024-02-27 DIAGNOSIS — J432 Centrilobular emphysema: Secondary | ICD-10-CM | POA: Diagnosis not present

## 2024-02-27 DIAGNOSIS — R300 Dysuria: Secondary | ICD-10-CM | POA: Insufficient documentation

## 2024-02-27 MED ORDER — CIPROFLOXACIN HCL 500 MG PO TABS
500.0000 mg | ORAL_TABLET | Freq: Two times a day (BID) | ORAL | 0 refills | Status: AC
  Filled 2024-02-27 (×2): qty 20, 10d supply, fill #0

## 2024-02-27 NOTE — Assessment & Plan Note (Signed)
 Mild to mod, liekly uti, for antibx course cipro  500 bid, to check ua and culture, to f/u any worsening symptoms or concerns

## 2024-02-27 NOTE — Progress Notes (Signed)
 Patient ID: [name], female   DOB: August 08, 1956, 68 y.o.   MRN: [id number]        Chief Complaint: follow up dysuria, frequency, mild retention x 3 days,        HPI:  [name] is a 68 y.o. female here with hx of UTI apr 2024 with klebsiella, now returns with above.  Denies urinary symptoms such as urgency, flank pain, hematuria or n/v, fever, chills.  Does have mild retention but too mild for flomax.  Pt denies chest pain, increased sob or doe, wheezing, orthopnea, PND, increased LE swelling, palpitations, dizziness or syncope.   Pt denies polydipsia, polyuria, or new focal neuro s/s.   Has ongoing difficult to treat constipation.         Wt Readings from Last 3 Encounters:  12/15/23 (!) 340 lb (154.2 kg)  09/08/23 (!) 326 lb (147.9 kg)  06/07/23 (!) 326 lb (147.9 kg)   BP Readings from Last 3 Encounters:  02/27/24 110/64  12/15/23 112/70  06/07/23 114/72         Past Medical History:  Diagnosis Date   Anemia, unspecified    Anxiety    Arthritis    about q joint i've got (07/14/2016)   Benign neoplasm of colon 12/2007   Hyperplastic colon polyps   CAD (coronary artery disease)    minimal catheterization, October 2008   Cellulitis    Chest pain    with stress   Childhood asthma    Chronic low back pain    Constipation    and diarrhea chronic..Dr. Myer Aho   Depression    Diverticulosis of colon (without mention of hemorrhage)    DVT (deep venous thrombosis) (HCC) 1990s   LLE   Family history of adverse reaction to anesthesia    daughter died having her 1st child cause she got too much anesthesia    GERD (gastroesophageal reflux disease)    History of blood transfusion 01/2008   related to hip OR   Homocystinemia    signif elevation in the past...plan folic acid, B6, B12   Hypopotassemia    Hypotension, unspecified    Leg pain    Lymphoproliferative disease (HCC)    disorder in the past??   Methadone  adverse reaction    for chronic leg and back pain    Other pulmonary embolism and infarction 2008   Significant 2008 and coumadin  therapy RV dysfunction...echo...2008..EF 50%..right ventricle markedly dilated w marked right ventricular dysfunc and moder tricuspid regurg/echo..March 2010, Ef 50%, mild dilation of right ventricule w mild decrease right ventric function    Peripheral vascular disease (HCC)    Rectus sheath hematoma 07/13/2012   On coumadin     Right bundle branch block    intermittent   Thyroid  disease    Tobacco use disorder    Urinary incontinence    Venous insufficiency (chronic) (peripheral)    Patient's legs were wrapped 2013   Past Surgical History:  Procedure Laterality Date   CARDIAC CATHETERIZATION  05/2007   thelbert 12/08/2010   CHOLECYSTECTOMY OPEN     COLONOSCOPY WITH PROPOFOL  N/A 08/11/2017   Procedure: COLONOSCOPY WITH PROPOFOL ;  Surgeon: Legrand Victory LITTIE DOUGLAS, MD;  Location: [facility] ENDOSCOPY;  Service: Gastroenterology;  Laterality: N/A;   COLONOSCOPY WITH PROPOFOL  N/A 06/16/2022   Procedure: COLONOSCOPY WITH PROPOFOL ;  Surgeon: Legrand Victory LITTIE DOUGLAS, MD;  Location: [facility] ENDOSCOPY;  Service: Gastroenterology;  Laterality: N/A;   GASTRIC BYPASS  1980s   JOINT REPLACEMENT  KNEE ARTHROSCOPY Left 05/2001   thelbert 12/21/2010   POLYPECTOMY  06/16/2022   Procedure: POLYPECTOMY;  Surgeon: Legrand Victory LITTIE DOUGLAS, MD;  Location: [facility] ENDOSCOPY;  Service: Gastroenterology;;   SHOULDER ARTHROSCOPY W/ ROTATOR CUFF REPAIR Right 08/2002   thelbert 12/21/2010   TONSILLECTOMY     TOTAL HIP ARTHROPLASTY Right 01/2008    reports that she quit smoking about 12 years ago. Her smoking use included cigarettes. She started smoking about 39 years ago. She has a 2.7 pack-year smoking history. She has never used smokeless tobacco. She reports that she does not drink alcohol and does not use drugs. family history includes Crohn's disease in her sister; Heart disease in her father and mother; Lung cancer in her maternal grandfather; Osteoporosis in her  sister; Other in her daughter and paternal grandfather. Allergies  Allergen Reactions   Amitiza  [Lubiprostone ] Diarrhea and Nausea And Vomiting   Morphine Anaphylaxis    Nausea, rash, itching, vomiting. states she tolerates hydromorphone  and methadone    Effexor [Venlafaxine] Other (See Comments)    Crying Spells    Nsaids     Bowel irregularity (coffee grounds)   Current Outpatient Medications on File Prior to Visit  Medication Sig Dispense Refill   bisacodyl (DULCOLAX) 5 MG EC tablet Take 30 mg by mouth daily as needed for moderate constipation.     calcium  carbonate (OS-CAL) 1250 (500 Ca) MG chewable tablet Chew by mouth.     estradiol  (ESTRACE ) 0.1 MG/GM vaginal cream Place 0.5g nightly for two weeks then twice a week after 30 g 11   furosemide  (LASIX ) 20 MG tablet Take 1 tablet (20 mg total) by mouth daily. 30 tablet 5   Glycerin-Hypromellose-PEG 400 (DRY EYE RELIEF DROPS) 0.2-0.2-1 % SOLN Place 1 drop into both eyes as needed (dry eyes).     HYDROmorphone  (DILAUDID ) 4 MG tablet Take 1-2 tablets (4-8 mg total) by mouth every 6 (six) hours as needed for breakthrough pain. 120 tablet 0   HYDROmorphone  (DILAUDID ) 4 MG tablet Take 1 - 2 tablets (4 - 8mg  total) by mouth every 6 (six) hours as needed for breakthrough pain 30 tablet 0   HYDROmorphone  (DILAUDID ) 4 MG tablet Take 1 - 2 tablets (4 - 8 mg total) by mouth every 6 (six) hours as needed for breakthrough pain 120 tablet 0   HYDROmorphone  (DILAUDID ) 4 MG tablet Take 1 tablet (4 mg total) by mouth every 6 (six) hours as needed for breakthrough pain 120 tablet 0   HYDROmorphone  (DILAUDID ) 4 MG tablet Take 1 tablet (4 mg total) by mouth every 6 (six) hours as needed for breakthrough pain 120 tablet 0   HYDROmorphone  (DILAUDID ) 4 MG tablet Take 1 tablet (4 mg total) by mouth every 6 (six) hours as needed for breakthrough pain 120 tablet 0   HYDROmorphone  (DILAUDID ) 4 MG tablet Take 1 tablet (4 mg total) by mouth every 6 (six) hours as needed  for breakthrough pain. 120 tablet 0   HYDROmorphone  (DILAUDID ) 4 MG tablet Take 1 tablet (4 mg total) by mouth every 6 (six) hours as needed for breakthrough pain. 01/20/24 120 tablet 0   HYDROmorphone  (DILAUDID ) 4 MG tablet Take 1 tablet (4 mg total) by mouth every 6 (six) hours as needed for breakthrough pain. 120 tablet 0   [START ON 03/10/2024] HYDROmorphone  (DILAUDID ) 4 MG tablet Take 1 tablet (4 mg total) by mouth every 6 (six) hours as needed for breakthrough pain. 120 tablet 0   HYDROmorphone  (DILAUDID ) 4 MG tablet Take  1 tablet (4 mg total) by mouth every 6 (six) hours as needed for breakthrough pain. 120 tablet 0   hyoscyamine  (LEVSIN ) 0.125 MG tablet Take 1 tablet by mouth 1-3 times a day as needed for intestinal spasms 15 tablet 0   levothyroxine  (SYNTHROID ) 200 MCG tablet Take 1 tablet (200 mcg total) by mouth daily before breakfast. 90 tablet 1   levothyroxine  (SYNTHROID ) 25 MCG tablet Take 1 tablet (25 mcg total) by mouth daily before breakfast. For total 225 mcg daily 90 tablet 1   methadone  (DOLOPHINE ) 10 MG tablet Take 1-2  tablets by mouth every 8 (eight) hours as needed for pain 180 tablet 0   methadone  (DOLOPHINE ) 10 MG tablet Take 1-2 tablets (10-20 mg total) by mouth every 8 (eight) hours as needed for pain 180 tablet 0   methadone  (DOLOPHINE ) 10 MG tablet Take 1-2 tablets (10-20 mg total) by mouth every 8 (eight) hours as needed for pain 180 tablet 0   methadone  (DOLOPHINE ) 10 MG tablet Take 1-2 tablets (10-20 mg total) by mouth every 8 (eight) hours as needed for pain 180 tablet 0   methadone  (DOLOPHINE ) 10 MG tablet Take 1 to 2 tablets by mouth every 8 hours as needed for pain 180 tablet 0   metoCLOPramide  (REGLAN ) 5 MG tablet Take 1 tablet an hour before starting a GoLytely  bowel prep. Take half the prep over 4 hours Repeat this process the following day. 2 tablet 0   naloxone (NARCAN) nasal spray 4 mg/0.1 mL Place 1 spray into the nose once as needed (opioid overdose).      nystatin -triamcinolone  ointment (MYCOLOG) Apply 1 Application topically 2 (two) times daily as needed (irritation). 120 g 5   RABEprazole  (ACIPHEX ) 20 MG tablet Take 20 mg by mouth daily as needed (acid reflux).     silver  sulfADIAZINE  (SILVADENE ) 1 % cream Apply topically daily as directed 50 g 0   Skin Protectants, Misc. (INTERDRY 10X36) SHEE UAD 30 each 5   Tenapanor  HCl (IBSRELA ) 50 MG TABS Take 1 tablet by mouth daily. 30 tablet 2   vitamin B-12 (CYANOCOBALAMIN ) 1000 MCG tablet Take 1 tablet (1,000 mcg total) by mouth daily.     warfarin (COUMADIN ) 5 MG tablet TAKE 1 TABLET BY MOUTH DAILY EXCEPT TAKE 1 1/2 TABLETS ON SUNDAYS AND WEDNESDAYS OR AS DIRECTED BY ANTICOAGULATION CLINIC 115 tablet 1   No current facility-administered medications on file prior to visit.        ROS:  All others reviewed and negative.  Objective        PE:  BP 110/64   Pulse 66   Temp 98.4 F (36.9 C)   Ht 5' 5 (1.651 m)   SpO2 99%   BMI 56.58 kg/m                 Constitutional: Pt appears in NAD               HENT: Head: NCAT.                Right Ear: External ear normal.                 Left Ear: External ear normal.                Eyes: . Pupils are equal, round, and reactive to light. Conjunctivae and EOM are normal               Nose: without d/c or deformity  Neck: Neck supple. Gross normal ROM               Cardiovascular: Normal rate and regular rhythm.                 Pulmonary/Chest: Effort normal and breath sounds without rales or wheezing.                Abd:  Soft, NT, ND, + BS, no organomegaly               Neurological: Pt is alert. At baseline orientation, motor grossly intact               Skin: Skin is warm. No rashes, no other new lesions, LE edema - none               Psychiatric: Pt behavior is normal without agitation   Micro: none  Cardiac tracings I have personally interpreted today:  none  Pertinent Radiological findings (summarize): none   Lab Results   Component Value Date   WBC 5.2 06/07/2023   HGB 13.4 06/07/2023   HCT 42.4 06/07/2023   PLT 174.0 06/07/2023   GLUCOSE 86 06/07/2023   CHOL 151 06/07/2023   TRIG 122.0 06/07/2023   HDL 35.90 (L) 06/07/2023   LDLDIRECT 105.0 05/24/2019   LDLCALC 91 06/07/2023   ALT 18 06/07/2023   AST 28 06/07/2023   NA 137 06/07/2023   K 3.9 06/07/2023   CL 99 06/07/2023   CREATININE 0.73 06/07/2023   BUN 10 06/07/2023   CO2 33 (H) 06/07/2023   TSH 2.57 06/07/2023   INR 3.5 (A) 02/22/2024   HGBA1C 5.6 12/02/2022   Assessment/Plan:  [name] is a 68 y.o. White or Caucasian [1] female with  has a past medical history of Anemia, unspecified, Anxiety, Arthritis, Benign neoplasm of colon (12/2007), CAD (coronary artery disease), Cellulitis, Chest pain, Childhood asthma, Chronic low back pain, Constipation, Depression, Diverticulosis of colon (without mention of hemorrhage), DVT (deep venous thrombosis) (HCC) (1990s), Family history of adverse reaction to anesthesia, GERD (gastroesophageal reflux disease), History of blood transfusion (01/2008), Homocystinemia, Hypopotassemia, Hypotension, unspecified, Leg pain, Lymphoproliferative disease (HCC), Methadone  adverse reaction, Other pulmonary embolism and infarction (2008), Peripheral vascular disease (HCC), Rectus sheath hematoma (07/13/2012), Right bundle branch block, Thyroid  disease, Tobacco use disorder, Urinary incontinence, and Venous insufficiency (chronic) (peripheral).  Dysuria Mild to mod, liekly uti, for antibx course cipro  500 bid, to check ua and culture, to f/u any worsening symptoms or concerns    COPD with emphysema (HCC) Stable overall, continue inhaler prn  Followup: Return if symptoms worsen or fail to improve.  Carmen Rush, MD 02/27/2024 7:04 PM [facility]

## 2024-02-27 NOTE — Assessment & Plan Note (Signed)
Stable overall, continue inhaler prn 

## 2024-02-27 NOTE — Telephone Encounter (Signed)
 [employer] order signed by PCP and faxed to [employer].

## 2024-02-27 NOTE — Patient Instructions (Signed)
 Please take all new medication as prescribed- the antibiotic  Please continue all other medications as before, and refills have been done if requested.  Please have the pharmacy call with any other refills you may need.  Please keep your appointments with your specialists as you may have planned  Please go to the LAB at the blood drawing area for the tests to be done  - just the urine testing today  You will be contacted by phone if any changes need to be made immediately.  Otherwise, you will receive a letter about your results with an explanation, but please check with MyChart first.

## 2024-02-28 ENCOUNTER — Ambulatory Visit: Payer: Self-pay | Admitting: Internal Medicine

## 2024-02-28 ENCOUNTER — Telehealth: Payer: Self-pay

## 2024-02-28 LAB — URINALYSIS, ROUTINE W REFLEX MICROSCOPIC
Bilirubin Urine: NEGATIVE
Ketones, ur: NEGATIVE
Nitrite: NEGATIVE
Specific Gravity, Urine: <=1.005 — AB (ref 1.000–1.030)
Total Protein, Urine: NEGATIVE
Urine Glucose: NEGATIVE
Urobilinogen, UA: 0.2 (ref 0.0–1.0)
pH: 7 (ref 5.0–8.0)

## 2024-02-28 NOTE — Telephone Encounter (Signed)
 Pt LVM reporting she was started on ciprofloxacin  for UTI yesterday and is requesting any dosing changes to her warfarin since cipro  can interact with warfarin.   LVM and advised pt no warfarin dose changes at this time but to check INR on 7/25. Pt tests INR at home so she is able to test.

## 2024-02-29 ENCOUNTER — Other Ambulatory Visit: Payer: Self-pay

## 2024-02-29 ENCOUNTER — Ambulatory Visit (INDEPENDENT_AMBULATORY_CARE_PROVIDER_SITE_OTHER): Payer: Self-pay

## 2024-02-29 ENCOUNTER — Other Ambulatory Visit: Payer: Self-pay | Admitting: Internal Medicine

## 2024-02-29 ENCOUNTER — Other Ambulatory Visit (HOSPITAL_COMMUNITY): Payer: Self-pay

## 2024-02-29 DIAGNOSIS — Z7901 Long term (current) use of anticoagulants: Secondary | ICD-10-CM | POA: Diagnosis not present

## 2024-02-29 LAB — URINE CULTURE

## 2024-02-29 MED ORDER — METHADONE HCL 10 MG PO TABS
10.0000 mg | ORAL_TABLET | Freq: Three times a day (TID) | ORAL | 0 refills | Status: DC | PRN
  Filled 2024-02-29: qty 180, 30d supply, fill #0

## 2024-02-29 MED ORDER — SULFAMETHOXAZOLE-TRIMETHOPRIM 800-160 MG PO TABS
1.0000 | ORAL_TABLET | Freq: Two times a day (BID) | ORAL | 0 refills | Status: DC
  Filled 2024-02-29 (×2): qty 20, 10d supply, fill #0

## 2024-02-29 NOTE — Patient Instructions (Signed)
Pre visit review using our clinic review tool, if applicable. No additional management support is needed unless otherwise documented below in the visit note. 

## 2024-02-29 NOTE — Progress Notes (Signed)
 No INR was performed by the pt or the office today.  Pt reports she was placed on cipro  a few days ago but the office called today and advised to stop cipro  and start Bactrim  x 10 days. Pt will start new abx this evening. Bactrim  has a major interaction with warfarin and can increase INR. Advised pt if any s/s of abnormal bruising or bleeding to go to ER or call 911. Pt verbalized understanding.  Reduce dose to 1/2 tablet daily and recheck on Monday, 7/28.

## 2024-03-01 ENCOUNTER — Other Ambulatory Visit (HOSPITAL_COMMUNITY): Payer: Self-pay

## 2024-03-04 ENCOUNTER — Ambulatory Visit (INDEPENDENT_AMBULATORY_CARE_PROVIDER_SITE_OTHER): Payer: Self-pay

## 2024-03-04 DIAGNOSIS — Z7901 Long term (current) use of anticoagulants: Secondary | ICD-10-CM

## 2024-03-04 LAB — POCT INR: INR: 1.9 — AB (ref 2.0–3.0)

## 2024-03-04 NOTE — Progress Notes (Signed)
 No INR was performed by the pt or the office today.  Pt was prescribed Bactrim , x 10 days, on 7/24. This abx has a major interaction with warfarin. She LVM this morning that she fell asleep and missed her evening dose of Bactrim  and did not think she should take it this morning. Pt's warfarin dosing was reduced on 7/24 until today.  Advised pt if any s/s of abnormal bruising or bleeding to go to ER or call 911. Advised to restart abx dose this morning. Reduce dose to 1/2 tablet daily and recheck on Friday, 8/1.  LVM with dosing and above instructions.

## 2024-03-04 NOTE — Patient Instructions (Addendum)
 Pre visit review using our clinic review tool, if applicable. No additional management support is needed unless otherwise documented below in the visit note.  Reduce dose to 1/2 tablet daily and recheck on Friday, 8/1.

## 2024-03-06 ENCOUNTER — Encounter: Payer: Self-pay | Admitting: Internal Medicine

## 2024-03-08 ENCOUNTER — Telehealth: Payer: Self-pay

## 2024-03-08 NOTE — Telephone Encounter (Signed)
 Pt reports she contacted [employer] and they told her they needed a new order due to PCP's name is not spelled like it is for Medicare. They report the provider's name is spelled Harlene and not as Stacy. They also said one of the diagnosis codes cannot be accepted.  Contacted [employer] and spoke with Curtistine who reported the same the pt did. He requested a new order be faxed with the updates. He requested to fax to the normal fax number. Advised the need for a rush due to pt being out of strips. He said mark the fax urgent.   New order completed and signed by PCP and faxed.

## 2024-03-08 NOTE — Telephone Encounter (Signed)
 Pt reports she tried to test INR today but was not able to get enough blood to perform test and now she is out of strips. She will contact the company to request a refill. She will test INR on Monday.

## 2024-03-11 ENCOUNTER — Encounter: Payer: Self-pay | Admitting: Internal Medicine

## 2024-03-11 NOTE — Telephone Encounter (Signed)
 Pt LVM reporting she talked with [employer] and they received the new order with the revisions. They are checking with insurance for authorization and then they will ship the INR testing strips. Pt will keep coumadin  clinic updated.  Pt also reports she is feeling much better after the abx.

## 2024-03-11 NOTE — Telephone Encounter (Signed)
 FYI.. pt wants noted to take Bactrim  as last option

## 2024-03-13 NOTE — Telephone Encounter (Signed)
 Pt reports finishing bactrim  on 8/4, but has continued to take the reduced warfarin dosing. She has been taking 1/2 tablet instead of full tablets.   She has still not received her INR testing strips but reports [employer] reported she should have them by the end of the week.   Advised to restart normal dosing. Take 1 tablet daily except take 1 1/2 tablets on Sunday.   Pt verbalized understanding.

## 2024-03-13 NOTE — Telephone Encounter (Addendum)
 Pt LVM reporting she should be receiving INR testing strips soon per [employer].  She also inquired what warfarin dose she should be on since she has finished the Bactrim .  Dosing before changes for interaction with Bactrim  is below. Change weekly dose to take 1 tablet daily except take 1 1/2 tablets on  Sunday.   LVM to return call.

## 2024-03-15 ENCOUNTER — Other Ambulatory Visit (HOSPITAL_COMMUNITY): Payer: Self-pay

## 2024-03-16 ENCOUNTER — Other Ambulatory Visit (HOSPITAL_COMMUNITY): Payer: Self-pay

## 2024-03-21 NOTE — Telephone Encounter (Signed)
 Contacted pt concerning testing INR. She reports she still has not received testing strips. She has contacted [employer] but they can't tell her why they have not sent the strips.  Contacted [employer] and spoke with Juanita, who reports they are still processing the order and the pt needs to contact them. Number is [phone], opt 1, opt 5 and then 5.  Contacted pt and advised to call. She will contact them today and f/u with the coumadin  clinic.

## 2024-03-21 NOTE — Telephone Encounter (Signed)
 Pt reports she has contacted [employer] and they said they are going to expedite the strips and she should have them in 24-48 hours. Advised if she received the strips tomorrow, Friday, to test tomorrow but if she does not then just test on Monday or Tuesday and not over the weekend. Pt verbalized understanding.

## 2024-03-26 ENCOUNTER — Ambulatory Visit (INDEPENDENT_AMBULATORY_CARE_PROVIDER_SITE_OTHER): Payer: Self-pay

## 2024-03-26 DIAGNOSIS — Z7901 Long term (current) use of anticoagulants: Secondary | ICD-10-CM | POA: Diagnosis not present

## 2024-03-26 LAB — POCT INR: INR: 1.9 — AB (ref 2.0–3.0)

## 2024-03-26 NOTE — Patient Instructions (Addendum)
 Pre visit review using our clinic review tool, if applicable. No additional management support is needed unless otherwise documented below in the visit note.  Increase dose today to take 1 1/2 tablets and then change weekly dose to take 1 tablet daily except take 1 1/2 tablets on Wednesday and Sunday. Recheck in 1 week.

## 2024-03-26 NOTE — Progress Notes (Signed)
 Pt tests at home. INR today is 1.9 Pt is using a steroid cream for rash.  Increase dose today to take 1 1/2 tablets and then change weekly dose to take 1 tablet daily except take 1 1/2 tablets on Wednesday and Sunday. Recheck in 1 week. Advised pt of dosing and recheck date. Pt verbalized understanding.

## 2024-03-27 ENCOUNTER — Other Ambulatory Visit (HOSPITAL_COMMUNITY): Payer: Self-pay

## 2024-03-27 MED ORDER — HYDROMORPHONE HCL 4 MG PO TABS
4.0000 mg | ORAL_TABLET | Freq: Four times a day (QID) | ORAL | 0 refills | Status: DC | PRN
  Filled 2024-04-15: qty 120, 30d supply, fill #0

## 2024-03-27 MED ORDER — HYDROMORPHONE HCL 4 MG PO TABS
4.0000 mg | ORAL_TABLET | Freq: Four times a day (QID) | ORAL | 0 refills | Status: DC | PRN
  Filled 2024-05-16: qty 120, 30d supply, fill #0

## 2024-03-29 ENCOUNTER — Other Ambulatory Visit (HOSPITAL_COMMUNITY): Payer: Self-pay

## 2024-03-30 ENCOUNTER — Encounter: Payer: Self-pay | Admitting: Internal Medicine

## 2024-03-30 ENCOUNTER — Other Ambulatory Visit (HOSPITAL_BASED_OUTPATIENT_CLINIC_OR_DEPARTMENT_OTHER): Payer: Self-pay

## 2024-03-30 ENCOUNTER — Other Ambulatory Visit (HOSPITAL_COMMUNITY): Payer: Self-pay

## 2024-03-30 MED ORDER — METHADONE HCL 10 MG PO TABS
10.0000 mg | ORAL_TABLET | Freq: Three times a day (TID) | ORAL | 0 refills | Status: DC | PRN
  Filled 2024-03-30: qty 180, 30d supply, fill #0

## 2024-04-01 ENCOUNTER — Encounter: Payer: Self-pay | Admitting: Internal Medicine

## 2024-04-01 ENCOUNTER — Telehealth (INDEPENDENT_AMBULATORY_CARE_PROVIDER_SITE_OTHER): Admitting: Internal Medicine

## 2024-04-01 DIAGNOSIS — R0781 Pleurodynia: Secondary | ICD-10-CM | POA: Diagnosis not present

## 2024-04-01 NOTE — Progress Notes (Signed)
 Virtual Visit via Video Note  I connected with [name] on 04/01/24 at  3:00 PM EDT by a video enabled telemedicine application and verified that I am speaking with the correct person using two identifiers.   I discussed the limitations of evaluation and management by telemedicine and the availability of in person appointments. The patient expressed understanding and agreed to proceed.  Present for the visit:  Myself, Dr Glade Hope, [name].  The patient is currently at home and I am in the office.    No referring provider.    History of Present Illness: This is an acute visit for a fall.    Friday night  - Feet got tangled when trying to open her door.  Was trying to get toto her rollator.  Her under arm caught the lever of the door and then she fell into the door and onto her rollator.    She did not feel like anything was broken.  She felt she was ok. She denies ay visiable bruising. It is so very sore inside.  She has intense pain in her right axilla, behind her arm and wrapping around her right mid rib cage.     She has started to cough and feels congested.  The dilauded does not let her cough so she was concerned that she was coughing.  She is concerned about an infection, internal damage or internal bleeding.      Review of Systems  Eyes:  Negative for blurred vision and double vision.  Respiratory:  Positive for cough. Negative for shortness of breath.   Gastrointestinal:  Positive for abdominal pain (chronic - not more than usual).  Genitourinary:        No change in urine color  Neurological:  Negative for dizziness and headaches (just yesterday - not after fall or today).      Social History   Socioeconomic History   Marital status: Divorced    Spouse name: Not on file   Number of children: 1   Years of education: Not on file   Highest education level: Associate degree: occupational, Scientist, product/process development, or vocational program  Occupational History    Occupation: Disabled    Employer: UNEMPLOYED  Tobacco Use   Smoking status: Former    Current packs/day: 0.00    Average packs/day: 0.1 packs/day for 27.0 years (2.7 ttl pk-yrs)    Types: Cigarettes    Start date: 06/08/1984    Quit date: 06/09/2011    Years since quitting: 12.8   Smokeless tobacco: Never  Vaping Use   Vaping status: Never Used  Substance and Sexual Activity   Alcohol use: No    Alcohol/week: 0.0 standard drinks of alcohol    Comment: 07/14/2016 nothing since the early 1990s   Drug use: No   Sexual activity: Not Currently  Other Topics Concern   Not on file  Social History Narrative      Lives alone   Social Drivers of Health   Financial Resource Strain: Medium Risk (12/14/2023)   Overall Financial Resource Strain (CARDIA)    Difficulty of Paying Living Expenses: Somewhat hard  Food Insecurity: No Food Insecurity (12/14/2023)   Hunger Vital Sign    Worried About Running Out of Food in the Last Year: Never true    Ran Out of Food in the Last Year: Never true  Transportation Needs: Unmet Transportation Needs (12/14/2023)   PRAPARE - Transportation    Lack of Transportation (Medical): Yes    Lack of  Transportation (Non-Medical): No  Physical Activity: Inactive (12/14/2023)   Exercise Vital Sign    Days of Exercise per Week: 0 days    Minutes of Exercise per Session: 0 min  Stress: No Stress Concern Present (12/14/2023)   [employer] of Occupational Health - Occupational Stress Questionnaire    Feeling of Stress : Not at all  Social Connections: Moderately Isolated (12/14/2023)   Social Connection and Isolation Panel    Frequency of Communication with Friends and Family: More than three times a week    Frequency of Social Gatherings with Friends and Family: More than three times a week    Attends Religious Services: More than 4 times per year    Active Member of [employer] or Organizations: No    Attends Banker Meetings: Never    Marital Status:  Divorced     Observations/Objective: Appears well in NAD Breathing normally and able to speak in full sentences No physical bruising  Assessment and Plan:  See Problem List for Assessment and Plan of chronic medical problems.   Follow Up Instructions:    I discussed the assessment and treatment plan with the patient. The patient was provided an opportunity to ask questions and all were answered. The patient agreed with the plan and demonstrated an understanding of the instructions.   The patient was advised to call back or seek an in-person evaluation if the symptoms worsen or if the condition fails to improve as anticipated.    Glade JINNY Hope, MD

## 2024-04-01 NOTE — Patient Instructions (Signed)
      Blood work was ordered.       Medications changes include :   None    A referral was ordered and someone will call you to schedule an appointment.     No follow-ups on file.

## 2024-04-01 NOTE — Assessment & Plan Note (Signed)
 Acute Related to recent fall Having pain - no bruising Limited by virtual visit - can not rule out fracture, internal bruise/bleeding, contusion Has pain medicine that she is taking No new SOB, fever or concerning bruising She is having a cough which is concerning for her because typically she does not have a cough She will monitor her symptoms and if her symptoms get worse-pain, cough or develops any fever, shortness of breath she will call immediately If her pain is not improving or worsening can consider imaging

## 2024-04-02 ENCOUNTER — Ambulatory Visit (INDEPENDENT_AMBULATORY_CARE_PROVIDER_SITE_OTHER): Payer: Self-pay

## 2024-04-02 DIAGNOSIS — Z7901 Long term (current) use of anticoagulants: Secondary | ICD-10-CM

## 2024-04-02 LAB — POCT INR: INR: 2.1 (ref 2.0–3.0)

## 2024-04-02 NOTE — Patient Instructions (Addendum)
 Pre visit review using our clinic review tool, if applicable. No additional management support is needed unless otherwise documented below in the visit note.  Continue 1 tablet daily except take 1 1/2 tablets on Wednesday and Sunday. Recheck in 2 week.

## 2024-04-02 NOTE — Progress Notes (Signed)
 Pt tests at home. INR today is 2.1 Continue 1 tablet daily except take 1 1/2 tablets on Wednesday and Sunday. Recheck in 2 week. Advised pt of dosing and recheck date. Pt verbalized understanding.

## 2024-04-03 ENCOUNTER — Encounter: Payer: Self-pay | Admitting: Internal Medicine

## 2024-04-04 ENCOUNTER — Encounter (HOSPITAL_COMMUNITY): Payer: Self-pay

## 2024-04-04 ENCOUNTER — Emergency Department (HOSPITAL_COMMUNITY)

## 2024-04-04 ENCOUNTER — Other Ambulatory Visit: Payer: Self-pay

## 2024-04-04 ENCOUNTER — Emergency Department (HOSPITAL_COMMUNITY)
Admission: EM | Admit: 2024-04-04 | Discharge: 2024-04-04 | Disposition: A | Discharge status: Home / Self Care | Attending: Emergency Medicine | Admitting: Emergency Medicine

## 2024-04-04 DIAGNOSIS — S299XXA Unspecified injury of thorax, initial encounter: Secondary | ICD-10-CM | POA: Diagnosis present

## 2024-04-04 DIAGNOSIS — W19XXXA Unspecified fall, initial encounter: Secondary | ICD-10-CM

## 2024-04-04 DIAGNOSIS — R0602 Shortness of breath: Secondary | ICD-10-CM | POA: Diagnosis not present

## 2024-04-04 DIAGNOSIS — J449 Chronic obstructive pulmonary disease, unspecified: Secondary | ICD-10-CM | POA: Insufficient documentation

## 2024-04-04 DIAGNOSIS — E669 Obesity, unspecified: Secondary | ICD-10-CM | POA: Insufficient documentation

## 2024-04-04 DIAGNOSIS — Z7901 Long term (current) use of anticoagulants: Secondary | ICD-10-CM | POA: Insufficient documentation

## 2024-04-04 DIAGNOSIS — Z7951 Long term (current) use of inhaled steroids: Secondary | ICD-10-CM | POA: Insufficient documentation

## 2024-04-04 DIAGNOSIS — S4980XA Other specified injuries of shoulder and upper arm, unspecified arm, initial encounter: Secondary | ICD-10-CM | POA: Diagnosis not present

## 2024-04-04 DIAGNOSIS — W01190A Fall on same level from slipping, tripping and stumbling with subsequent striking against furniture, initial encounter: Secondary | ICD-10-CM | POA: Insufficient documentation

## 2024-04-04 DIAGNOSIS — R0781 Pleurodynia: Secondary | ICD-10-CM | POA: Diagnosis not present

## 2024-04-04 DIAGNOSIS — R1032 Left lower quadrant pain: Secondary | ICD-10-CM | POA: Diagnosis not present

## 2024-04-04 DIAGNOSIS — J9811 Atelectasis: Secondary | ICD-10-CM | POA: Diagnosis not present

## 2024-04-04 DIAGNOSIS — S20211A Contusion of right front wall of thorax, initial encounter: Secondary | ICD-10-CM | POA: Diagnosis not present

## 2024-04-04 DIAGNOSIS — Z86711 Personal history of pulmonary embolism: Secondary | ICD-10-CM | POA: Insufficient documentation

## 2024-04-04 DIAGNOSIS — M25519 Pain in unspecified shoulder: Secondary | ICD-10-CM | POA: Diagnosis not present

## 2024-04-04 DIAGNOSIS — Z79899 Other long term (current) drug therapy: Secondary | ICD-10-CM | POA: Diagnosis not present

## 2024-04-04 LAB — CBC
HCT: 42.5 % (ref 36.0–46.0)
Hemoglobin: 13.3 g/dL (ref 12.0–15.0)
MCH: 28.5 pg (ref 26.0–34.0)
MCHC: 31.3 g/dL (ref 30.0–36.0)
MCV: 91 fL (ref 80.0–100.0)
Platelets: 163 K/uL (ref 150–400)
RBC: 4.67 MIL/uL (ref 3.87–5.11)
RDW: 14.5 % (ref 11.5–15.5)
WBC: 5.8 K/uL (ref 4.0–10.5)
nRBC: 0 % (ref 0.0–0.2)

## 2024-04-04 LAB — BASIC METABOLIC PANEL WITH GFR
Anion gap: 8 (ref 5–15)
BUN: 8 mg/dL (ref 8–23)
CO2: 29 mmol/L (ref 22–32)
Calcium: 8.9 mg/dL (ref 8.9–10.3)
Chloride: 100 mmol/L (ref 98–111)
Creatinine, Ser: 0.76 mg/dL (ref 0.44–1.00)
GFR, Estimated: >60 mL/min (ref >60)
Glucose, Bld: 102 mg/dL — ABNORMAL HIGH (ref 70–99)
Potassium: 3.7 mmol/L (ref 3.5–5.1)
Sodium: 137 mmol/L (ref 135–145)

## 2024-04-04 LAB — BRAIN NATRIURETIC PEPTIDE: B Natriuretic Peptide: 20.8 pg/mL (ref 0.0–100.0)

## 2024-04-04 LAB — PROTIME-INR
INR: 2.1 — ABNORMAL HIGH (ref 0.8–1.2)
Prothrombin Time: 24.2 s — ABNORMAL HIGH (ref 11.4–15.2)

## 2024-04-04 MED ORDER — HYDROMORPHONE HCL 1 MG/ML IJ SOLN
2.0000 mg | Freq: Once | INTRAMUSCULAR | Status: AC
  Administered 2024-04-04: 2 mg via INTRAVENOUS
  Filled 2024-04-04: qty 2

## 2024-04-04 NOTE — ED Provider Notes (Signed)
 [place] [facility]GENCY DEPARTMENT AT [facility] Provider Note   CSN: [id number] Arrival date & time: 04/04/24  2104     Patient presents with: Shortness of Breath and Fall   [name] is a 68 y.o. female.  With a history of PE on warfarin COPD obesity who presents to the ED for right-sided rib pain.  Patient had a fall from standing on August 24.  She fell after tripping in the entryway to her house.  She struck her right ribs on the door handle.  No head trauma or loss of consciousness.  She was seen by her PCP for follow-up visit yesterday and was feeling well but her rib pain has increased.  Pain localized over the right lateral ribs where she hit the door handle.  Does not feel particularly short of breath at rest but it does hurt more when she takes a deep breath.  Is compliant with her warfarin and checks her INR at home.  Last was 2.1 at home.  No fevers chills nausea or vomiting.    Shortness of Breath Fall Associated symptoms include shortness of breath.       Prior to Admission medications   Medication Sig Start Date End Date Taking? Authorizing Provider  calcium  carbonate (OS-CAL) 1250 (500 Ca) MG chewable tablet Chew by mouth. 11/10/17   [provider]  estradiol  (ESTRACE ) 0.1 MG/GM vaginal cream Place 0.5g nightly for two weeks then twice a week after 07/11/22   Marilynne Rosaline SAILOR, MD  furosemide  (LASIX ) 20 MG tablet Take 1 tablet (20 mg total) by mouth daily. 12/15/23   Geofm Glade PARAS, MD  Glycerin-Hypromellose-PEG 400 (DRY EYE RELIEF DROPS) 0.2-0.2-1 % SOLN Place 1 drop into both eyes as needed (dry eyes).    [provider]  HYDROmorphone  (DILAUDID ) 4 MG tablet Take 1-2 tablets (4-8 mg total) by mouth every 6 (six) hours as needed for breakthrough pain. 04/14/23     HYDROmorphone  (DILAUDID ) 4 MG tablet Take 1 - 2 tablets (4 - 8mg  total) by mouth every 6 (six) hours as needed for breakthrough pain 07/08/23     HYDROmorphone  (DILAUDID ) 4 MG  tablet Take 1 - 2 tablets (4 - 8 mg total) by mouth every 6 (six) hours as needed for breakthrough pain 06/07/23     HYDROmorphone  (DILAUDID ) 4 MG tablet Take 1 tablet (4 mg total) by mouth every 6 (six) hours as needed for breakthrough pain 07/10/23     HYDROmorphone  (DILAUDID ) 4 MG tablet Take 1 tablet (4 mg total) by mouth every 6 (six) hours as needed for breakthrough pain 09/07/23     HYDROmorphone  (DILAUDID ) 4 MG tablet Take 1 tablet (4 mg total) by mouth every 6 (six) hours as needed for breakthrough pain 08/08/23     HYDROmorphone  (DILAUDID ) 4 MG tablet Take 1 tablet (4 mg total) by mouth every 6 (six) hours as needed for breakthrough pain. 08/30/23     HYDROmorphone  (DILAUDID ) 4 MG tablet Take 1 tablet (4 mg total) by mouth every 6 (six) hours as needed for breakthrough pain. 01/20/24 01/20/24     HYDROmorphone  (DILAUDID ) 4 MG tablet Take 1 tablet (4 mg total) by mouth every 6 (six) hours as needed for breakthrough pain. 12/20/23     HYDROmorphone  (DILAUDID ) 4 MG tablet Take 1 tablet (4 mg total) by mouth every 6 (six) hours as needed for breakthrough pain. 03/10/24     HYDROmorphone  (DILAUDID ) 4 MG tablet Take 1 tablet (4 mg total) by mouth  every 6 (six) hours as needed for breakthrough pain. 02/08/24     HYDROmorphone  (DILAUDID ) 4 MG tablet Take 1 tablet (4 mg total) by mouth every 6 (six) hours as needed for breakthrough pain. 04/27/24     HYDROmorphone  (DILAUDID ) 4 MG tablet Take 1 tablet (4 mg total) by mouth every 6 (six) hours as needed for breakthrough pain. 03/27/24     hyoscyamine  (LEVSIN ) 0.125 MG tablet Take 1 tablet by mouth 1-3 times a day as needed for intestinal spasms 01/17/24   Legrand Victory LITTIE DOUGLAS, MD  levothyroxine  (SYNTHROID ) 200 MCG tablet Take 1 tablet (200 mcg total) by mouth daily before breakfast. 01/02/24   Burns, Glade PARAS, MD  levothyroxine  (SYNTHROID ) 25 MCG tablet Take 1 tablet (25 mcg total) by mouth daily before breakfast. For total 225 mcg daily 01/02/24   Geofm Glade PARAS, MD   methadone  (DOLOPHINE ) 10 MG tablet Take 1-2  tablets by mouth every 8 (eight) hours as needed for pain 05/07/23     methadone  (DOLOPHINE ) 10 MG tablet Take 1-2 tablets (10-20 mg total) by mouth every 8 (eight) hours as needed for pain 06/07/23     methadone  (DOLOPHINE ) 10 MG tablet Take 1-2 tablets (10-20 mg total) by mouth every 8 (eight) hours as needed for pain 06/07/23     methadone  (DOLOPHINE ) 10 MG tablet Take 1-2 tablets (10-20 mg total) by mouth every 8 (eight) hours as needed for pain 12/20/23     methadone  (DOLOPHINE ) 10 MG tablet Take 1 to 2 tablets by mouth every 8 hours as needed for pain 12/20/23     methadone  (DOLOPHINE ) 10 MG tablet Take 1-2 tablets (10-20 mg total) by mouth every 8 (eight) hours as needed for pain 02/08/24     methadone  (DOLOPHINE ) 10 MG tablet Take 1-2 tablets (10-20 mg total) by mouth every 8 (eight) hours as needed for pain 02/08/24     methadone  (DOLOPHINE ) 10 MG tablet Take 1-2 tablets (10-20 mg total) by mouth every 8 (eight) hours as needed for pain. 03/27/24     metoCLOPramide  (REGLAN ) 5 MG tablet Take 1 tablet an hour before starting a GoLytely  bowel prep. Take half the prep over 4 hours Repeat this process the following day. 04/21/23   Legrand Victory LITTIE DOUGLAS, MD  naloxone [facility]) nasal spray 4 mg/0.1 mL Place 1 spray into the nose once as needed (opioid overdose). 08/04/21   [provider]  nystatin -triamcinolone  ointment (MYCOLOG) Apply 1 Application topically 2 (two) times daily as needed (irritation). 12/02/22   Geofm Glade PARAS, MD  RABEprazole  (ACIPHEX ) 20 MG tablet Take 20 mg by mouth daily as needed (acid reflux).    [provider]  silver  sulfADIAZINE  (SILVADENE ) 1 % cream Apply topically daily as directed 06/30/23   Geofm Glade PARAS, MD  Skin Protectants, Misc. (INTERDRY 10X36) SHEE UAD 11/30/21   Geofm Glade PARAS, MD  sulfamethoxazole -trimethoprim  (BACTRIM  DS) 800-160 MG tablet Take 1 tablet by mouth 2 (two) times daily. 02/29/24   Norleen Lynwood ORN,  MD  Tenapanor  HCl (IBSRELA ) 50 MG TABS Take 1 tablet by mouth daily. 11/23/23   Legrand Victory LITTIE DOUGLAS, MD  vitamin B-12 (CYANOCOBALAMIN ) 1000 MCG tablet Take 1 tablet (1,000 mcg total) by mouth daily. 04/17/18   Geofm Glade PARAS, MD  warfarin (COUMADIN ) 5 MG tablet TAKE 1 TABLET BY MOUTH DAILY EXCEPT TAKE 1 1/2 TABLETS ON SUNDAYS AND WEDNESDAYS OR AS DIRECTED BY ANTICOAGULATION CLINIC 01/15/24   Geofm Glade PARAS, MD    Allergies:  Amitiza  [lubiprostone ], Morphine, Effexor [venlafaxine], Nsaids, and Bactrim  [sulfamethoxazole -trimethoprim ]    Review of Systems  Respiratory:  Positive for shortness of breath.     Updated Vital Signs BP 125/63 (BP Location: Left Wrist)   Pulse 67   Temp 98.3 F (36.8 C) (Oral)   Resp 18   Ht 5' 5 (1.651 m)   Wt (!) 137.9 kg   SpO2 94%   BMI 50.59 kg/m   Physical Exam Vitals and nursing note reviewed.  HENT:     Head: Normocephalic and atraumatic.  Eyes:     Pupils: Pupils are equal, round, and reactive to light.  Cardiovascular:     Rate and Rhythm: Normal rate and regular rhythm.  Pulmonary:     Effort: Pulmonary effort is normal.     Breath sounds: Normal breath sounds.  Abdominal:     Palpations: Abdomen is soft.     Tenderness: There is no abdominal tenderness.  Musculoskeletal:     Comments: Right lateral chest wall tenderness without crepitus deformity No ecchymosis  Skin:    General: Skin is warm and dry.  Neurological:     Mental Status: She is alert.  Psychiatric:        Mood and Affect: Mood normal.     (all labs ordered are listed, but only abnormal results are displayed) Labs Reviewed  BASIC METABOLIC PANEL WITH GFR - Abnormal; Notable for the following components:      Result Value   Glucose, Bld 102 (*)    All other components within normal limits  PROTIME-INR - Abnormal; Notable for the following components:   Prothrombin Time 24.2 (*)    INR 2.1 (*)    All other components within normal limits  CBC  BRAIN NATRIURETIC  PEPTIDE    EKG: EKG Interpretation Date/Time:  Thursday April 04 2024 21:12:18 EDT Ventricular Rate:  66 PR Interval:  209 QRS Duration:  120 QT Interval:  447 QTC Calculation: 469 R Axis:   -51  Text Interpretation: Sinus rhythm Nonspecific IVCD with LAD Borderline T abnormalities, anterior leads Confirmed by Pamella Sharper [phone]) on 04/04/2024 9:37:36 PM  Radiology: [facility] Chest 2 View Result Date: 04/04/2024 CLINICAL DATA:  Shortness of breath. EXAM: CHEST - 2 VIEW COMPARISON:  Chest radiograph dated 07/13/2016. FINDINGS: Shallow inspiration with minimal bibasilar atelectasis. No focal consolidation, pleural effusion, pneumothorax. Stable cardiac silhouette. No acute osseous pathology. IMPRESSION: No active cardiopulmonary disease. Electronically Signed   By: Vanetta Chou M.D.   On: 04/04/2024 21:35     Procedures   Medications Ordered in the ED  HYDROmorphone  (DILAUDID ) injection 2 mg (2 mg Intravenous Given 04/04/24 2154)    Clinical Course as of 04/04/24 2318  Thu Apr 04, 2024  2317 No appreciable rib fractures.  Labs look okay.  Stable on room air.  Able to pull 1250 on I-S.  Suspect rib contusion.  Constipation symptomatic management.  She will follow-up with her primary doctor. [MP]    Clinical Course User Index [MP] Pamella Sharper LABOR, DO                                 Medical Decision Making 68 year old female with history as above presents for right-sided rib pain after fall 4 nights ago.  No head trauma or loss of consciousness.  Stable on room air.  Blood pressure within normal limits.  Afebrile.  Suspect rib fracture versus rib contusion.  Will  obtain x-ray laboratory workup and continue to monitor.  Will provide her with opioid analgesic as she is on Dilaudid  chronically for chronic pain.  Will give her incentive spirometer  Amount and/or Complexity of Data Reviewed Labs: ordered. Radiology: ordered.  Risk Prescription drug management.        Final  diagnoses:  Contusion of rib on right side, initial encounter  Fall, initial encounter    ED Discharge Orders     None          Pamella Ozell LABOR, DO 04/04/24 2318

## 2024-04-04 NOTE — ED Triage Notes (Addendum)
 Patient BIB [facility] from home with complaint of SOB and GLF, and right sided rib pain.  EMS reports patient had GLF on Sunday 8/24 and was seen by PCP 8/27 for fall. Patient reports waking up this am with worsening pain and SOB.   Patient reports pain worse with coughing

## 2024-04-04 NOTE — Discharge Instructions (Signed)
 You were seen in the emergency department for pain of your right ribs after a fall 3 days ago There was no evidence of rib fractures Is important that you use the incentive spirometer provided at least every couple hours to make sure you are inflating your lungs enough Take your previously prescribed pain medication for pain relief Return to the emergency comfortable breathing or any other concerns

## 2024-04-04 NOTE — ED Notes (Signed)
 Patient transported to X-ray

## 2024-04-05 ENCOUNTER — Telehealth: Payer: Self-pay

## 2024-04-05 NOTE — Telephone Encounter (Signed)
 Pt reports she was in ER yesterday due to a fall and resulting rib pain.  INR was checked in ER and was 2.1  Pt reports no fracture was found but they reported it could be a bone bruise.   Nothing was  prescribed. Pt will f/u with PCP if any further trouble with her ribs.

## 2024-04-05 NOTE — Telephone Encounter (Signed)
 noted

## 2024-04-10 ENCOUNTER — Other Ambulatory Visit: Payer: Self-pay | Admitting: Gastroenterology

## 2024-04-10 ENCOUNTER — Other Ambulatory Visit: Payer: Self-pay

## 2024-04-10 ENCOUNTER — Other Ambulatory Visit (HOSPITAL_COMMUNITY): Payer: Self-pay

## 2024-04-10 DIAGNOSIS — K581 Irritable bowel syndrome with constipation: Secondary | ICD-10-CM

## 2024-04-10 DIAGNOSIS — K5909 Other constipation: Secondary | ICD-10-CM

## 2024-04-10 MED ORDER — HYOSCYAMINE SULFATE 0.125 MG PO TABS
ORAL_TABLET | ORAL | 0 refills | Status: AC
  Filled 2024-04-10: qty 15, 5d supply, fill #0

## 2024-04-10 MED ORDER — IBSRELA 50 MG PO TABS
50.0000 mg | ORAL_TABLET | Freq: Every day | ORAL | 0 refills | Status: DC
  Filled 2024-04-10: qty 60, 30d supply, fill #0

## 2024-04-11 ENCOUNTER — Other Ambulatory Visit: Payer: Self-pay

## 2024-04-11 ENCOUNTER — Other Ambulatory Visit: Payer: Self-pay | Admitting: *Deleted

## 2024-04-11 ENCOUNTER — Other Ambulatory Visit (HOSPITAL_COMMUNITY): Payer: Self-pay

## 2024-04-11 DIAGNOSIS — K581 Irritable bowel syndrome with constipation: Secondary | ICD-10-CM

## 2024-04-11 DIAGNOSIS — K5909 Other constipation: Secondary | ICD-10-CM

## 2024-04-11 MED ORDER — IBSRELA 50 MG PO TABS
50.0000 mg | ORAL_TABLET | Freq: Every day | ORAL | 1 refills | Status: AC
  Filled 2024-04-11: qty 60, 30d supply, fill #0

## 2024-04-15 ENCOUNTER — Other Ambulatory Visit (HOSPITAL_COMMUNITY): Payer: Self-pay

## 2024-04-16 ENCOUNTER — Ambulatory Visit (INDEPENDENT_AMBULATORY_CARE_PROVIDER_SITE_OTHER): Payer: Self-pay

## 2024-04-16 DIAGNOSIS — Z7901 Long term (current) use of anticoagulants: Secondary | ICD-10-CM

## 2024-04-16 LAB — POCT INR: INR: 2.8 (ref 2.0–3.0)

## 2024-04-16 NOTE — Patient Instructions (Addendum)
 Pre visit review using our clinic review tool, if applicable. No additional management support is needed unless otherwise documented below in the visit note.  Continue 1 tablet daily except take 1 1/2 tablets on Wednesday and Sunday. Recheck in 2 week, 9/23.

## 2024-04-16 NOTE — Progress Notes (Signed)
 Pt tests at home. INR today is 2.8 Pt was in ER on 8/28 for a fall and contusion of ribs. No fractures. No new medications.  Continue 1 tablet daily except take 1 1/2 tablets on Wednesday and Sunday. Recheck in 2 week, 9/23. Advised pt of dosing and recheck date. Pt verbalized understanding.

## 2024-04-18 NOTE — Telephone Encounter (Signed)
 Pt reports she has a round, red circle on her arm on the side she had the fall. She is concerned this may be caused from warfarin. Pt denies any other symptoms. Advised this is most likely a bruise from the fall but if anything changes or she develops any new symptoms to contact the office. Pt requested to contact the coumadin  clinic via mychart so a msg was sent that she can send a msg to the coumadin  clinic directly. Pt verbalized understanding.

## 2024-04-19 ENCOUNTER — Encounter: Payer: Self-pay | Admitting: Internal Medicine

## 2024-04-23 ENCOUNTER — Encounter: Payer: Self-pay | Admitting: Internal Medicine

## 2024-04-29 ENCOUNTER — Other Ambulatory Visit (HOSPITAL_BASED_OUTPATIENT_CLINIC_OR_DEPARTMENT_OTHER): Payer: Self-pay

## 2024-04-29 MED ORDER — METHADONE HCL 10 MG PO TABS
10.0000 mg | ORAL_TABLET | Freq: Three times a day (TID) | ORAL | 0 refills | Status: DC | PRN
  Filled 2024-04-29: qty 180, 30d supply, fill #0

## 2024-04-30 ENCOUNTER — Ambulatory Visit (INDEPENDENT_AMBULATORY_CARE_PROVIDER_SITE_OTHER): Payer: Self-pay

## 2024-04-30 DIAGNOSIS — Z86711 Personal history of pulmonary embolism: Secondary | ICD-10-CM | POA: Diagnosis not present

## 2024-04-30 DIAGNOSIS — Z7901 Long term (current) use of anticoagulants: Secondary | ICD-10-CM | POA: Diagnosis not present

## 2024-04-30 LAB — POCT INR: INR: 2.5 (ref 2.0–3.0)

## 2024-04-30 NOTE — Progress Notes (Signed)
 Pt tests at home. INR today is 2.5 Pt was in ER on 8/28 for a fall and contusion of ribs. No fractures. No new medications.  Continue 1 tablet daily except take 1 1/2 tablets on Wednesday and Sunday. Recheck in 2 week,10/7. Advised pt of dosing and recheck date. Pt verbalized understanding.

## 2024-04-30 NOTE — Patient Instructions (Addendum)
 Pre visit review using our clinic review tool, if applicable. No additional management support is needed unless otherwise documented below in the visit note.  Continue 1 tablet daily except take 1 1/2 tablets on Wednesday and Sunday. Recheck in 2 week, 10/7.

## 2024-05-13 ENCOUNTER — Other Ambulatory Visit (HOSPITAL_COMMUNITY): Payer: Self-pay

## 2024-05-15 ENCOUNTER — Ambulatory Visit (INDEPENDENT_AMBULATORY_CARE_PROVIDER_SITE_OTHER): Payer: Self-pay

## 2024-05-15 DIAGNOSIS — Z7901 Long term (current) use of anticoagulants: Secondary | ICD-10-CM | POA: Diagnosis not present

## 2024-05-15 LAB — POCT INR: INR: 2.6 (ref 2.0–3.0)

## 2024-05-15 NOTE — Patient Instructions (Addendum)
 Pre visit review using our clinic review tool, if applicable. No additional management support is needed unless otherwise documented below in the visit note.  Continue 1 tablet daily except take 1 1/2 tablets on Wednesday and Sunday. Recheck in 2 week, 10/22.

## 2024-05-15 NOTE — Progress Notes (Signed)
 Pt tests at home. INR today is 2.6 Continue 1 tablet daily except take 1 1/2 tablets on Wednesday and Sunday. Recheck in 2 week, 10/22. LVM with dosing and recheck date.

## 2024-05-16 ENCOUNTER — Other Ambulatory Visit: Payer: Self-pay

## 2024-05-16 ENCOUNTER — Other Ambulatory Visit (HOSPITAL_COMMUNITY): Payer: Self-pay

## 2024-05-28 ENCOUNTER — Ambulatory Visit (INDEPENDENT_AMBULATORY_CARE_PROVIDER_SITE_OTHER): Payer: Self-pay

## 2024-05-28 ENCOUNTER — Other Ambulatory Visit (HOSPITAL_COMMUNITY): Payer: Self-pay

## 2024-05-28 DIAGNOSIS — Z7901 Long term (current) use of anticoagulants: Secondary | ICD-10-CM | POA: Diagnosis not present

## 2024-05-28 LAB — POCT INR: INR: 2.9 (ref 2.0–3.0)

## 2024-05-28 MED ORDER — HYDROMORPHONE HCL 4 MG PO TABS
4.0000 mg | ORAL_TABLET | Freq: Four times a day (QID) | ORAL | 0 refills | Status: DC | PRN
  Filled 2024-07-16: qty 120, 30d supply, fill #0

## 2024-05-28 MED ORDER — METHADONE HCL 10 MG PO TABS
10.0000 mg | ORAL_TABLET | Freq: Three times a day (TID) | ORAL | 0 refills | Status: AC
  Filled 2024-05-28: qty 180, 30d supply, fill #0

## 2024-05-28 MED ORDER — HYDROMORPHONE HCL 4 MG PO TABS
4.0000 mg | ORAL_TABLET | Freq: Four times a day (QID) | ORAL | 0 refills | Status: AC | PRN
  Filled 2024-06-14: qty 120, 30d supply, fill #0

## 2024-05-28 NOTE — Patient Instructions (Addendum)
 Pre visit review using our clinic review tool, if applicable. No additional management support is needed unless otherwise documented below in the visit note.  Continue 1 tablet daily except take 1 1/2 tablets on Wednesday and Sunday. Recheck in 2 week, 11/4.

## 2024-05-28 NOTE — Progress Notes (Addendum)
 Pt tests at home. INR today is 2.9 Continue 1 tablet daily except take 1 1/2 tablets on Wednesday and Sunday. Recheck in 2 week, 11/4. Advised pt of dosing and recheck date. Pt verbalized understanding.

## 2024-05-29 ENCOUNTER — Encounter: Payer: Self-pay | Admitting: Internal Medicine

## 2024-06-06 DIAGNOSIS — E559 Vitamin D deficiency, unspecified: Secondary | ICD-10-CM | POA: Insufficient documentation

## 2024-06-06 NOTE — Progress Notes (Signed)
 Subjective:    Patient ID: [name], female    DOB: [birth year], 68 y.o.   MRN: [id number]      HPI [name] is here for a Physical exam and her chronic medical problems.   She knows how bad she needs to lose weight, but is very limited the amount of exercise she can do.  She is trying to eat better.  She thinks weight loss would help her knee pain and her back pain.  Significant leg swelling.  She has a rash under both breasts and would like to get a cream for it.   Medications and allergies reviewed with patient and updated if appropriate.  Current Outpatient Medications on File Prior to Visit  Medication Sig Dispense Refill   calcium  carbonate (OS-CAL) 1250 (500 Ca) MG chewable tablet Chew by mouth.     Glycerin-Hypromellose-PEG 400 (DRY EYE RELIEF DROPS) 0.2-0.2-1 % SOLN Place 1 drop into both eyes as needed (dry eyes).     HYDROmorphone  (DILAUDID ) 4 MG tablet Take 1 tablet (4 mg total) by mouth every 6 (six) hours as needed for breakthrough pain 120 tablet 0   HYDROmorphone  (DILAUDID ) 4 MG tablet Take 1 tablet (4 mg total) by mouth every 6 (six) hours as needed for breakthrough pain 120 tablet 0   hyoscyamine  (LEVSIN ) 0.125 MG tablet Take 1 tablet by mouth 1-3 times a day as needed for intestinal spasms. SCHEDULE APPOINTMENT FOR ADDITIONAL REFILLS. 15 tablet 0   levothyroxine  (SYNTHROID ) 200 MCG tablet Take 1 tablet (200 mcg total) by mouth daily before breakfast. 90 tablet 1   levothyroxine  (SYNTHROID ) 25 MCG tablet Take 1 tablet (25 mcg total) by mouth daily before breakfast. For total 225 mcg daily 90 tablet 1   methadone  (DOLOPHINE ) 10 MG tablet Take 1-2 tablets (10-20 mg total) by mouth every 8 (eight) hours as needed for pain. 180 tablet 0   methadone  (DOLOPHINE ) 10 MG tablet Take 1-2 tablets (10-20 mg total) by mouth every 8 (eight) hours as needed for pain. 180 tablet 0   methadone  (DOLOPHINE ) 10 MG tablet Take 1-2 tablets (10-20 mg total) by mouth every 8 (eight)  hours. 180 tablet 0   naloxone (NARCAN) nasal spray 4 mg/0.1 mL Place 1 spray into the nose once as needed (opioid overdose).     RABEprazole  (ACIPHEX ) 20 MG tablet Take 20 mg by mouth daily as needed (acid reflux).     Tenapanor  HCl (IBSRELA ) 50 MG TABS Take one tablet (50 mg) by mouth daily. 60 tablet 1   warfarin (COUMADIN ) 5 MG tablet TAKE 1 TABLET BY MOUTH DAILY EXCEPT TAKE 1 1/2 TABLETS ON SUNDAYS AND WEDNESDAYS OR AS DIRECTED BY ANTICOAGULATION CLINIC 115 tablet 1   vitamin B-12 (CYANOCOBALAMIN ) 1000 MCG tablet Take 1 tablet (1,000 mcg total) by mouth daily.     No current facility-administered medications on file prior to visit.    Review of Systems  Constitutional:  Negative for fever.  Eyes:  Negative for visual disturbance.  Respiratory:  Positive for shortness of breath. Negative for cough and wheezing.   Cardiovascular:  Positive for leg swelling. Negative for chest pain and palpitations.  Gastrointestinal:  Positive for diarrhea. Negative for abdominal pain, blood in stool, constipation and nausea.       Chronic GERD - controlled  Genitourinary:  Positive for frequency. Negative for dysuria.  Musculoskeletal:  Positive for arthralgias and back pain.  Skin:  Positive for rash (under b/l breasts).  Neurological:  Positive for light-headedness (  when she stands). Negative for headaches.  Psychiatric/Behavioral:  Positive for dysphoric mood. The patient is nervous/anxious.        Objective:   Vitals:   06/07/24 1331  BP: 116/68  Pulse: 70  Temp: 98.3 F (36.8 C)  SpO2: 94%   Filed Weights   06/07/24 1331  Weight: (!) 358 lb (162.4 kg)   Body mass index is 59.57 kg/m.  BP Readings from Last 3 Encounters:  06/07/24 116/68  04/04/24 (!) 142/88  02/27/24 110/64    Wt Readings from Last 3 Encounters:  06/07/24 (!) 358 lb (162.4 kg)  04/04/24 (!) 304 lb (137.9 kg)  12/15/23 (!) 340 lb (154.2 kg)       Physical Exam Constitutional: She appears well-developed  and well-nourished. No distress.  HENT:  Head: Normocephalic and atraumatic.  Right Ear: External ear normal. Normal ear canal and TM Left Ear: External ear normal.  Normal ear canal and TM Mouth/Throat: Oropharynx is clear and moist.  Eyes: Conjunctivae normal.  Neck: Neck supple. No tracheal deviation present. No thyromegaly present.  No carotid bruit  Cardiovascular: Normal rate, regular rhythm and normal heart sounds.   2/6 sys murmur heard.  mild edema with chronic skin thickening and discoloration c/w lymphedema. Pulmonary/Chest: Effort normal and breath sounds normal. No respiratory distress. She has no wheezes. She has no rales.  Breast: deferred   Abdominal: Soft. She exhibits no distension. There is no tenderness.  Lymphadenopathy: She has no cervical adenopathy.  Skin: Skin is warm and dry. She is not diaphoretic. Yeast infection under both breasts L > R Psychiatric: She has a normal mood and affect. Her behavior is normal.     Lab Results  Component Value Date   WBC 5.8 04/04/2024   HGB 13.3 04/04/2024   HCT 42.5 04/04/2024   PLT 163 04/04/2024   GLUCOSE 102 (H) 04/04/2024   CHOL 151 06/07/2023   TRIG 122.0 06/07/2023   HDL 35.90 (L) 06/07/2023   LDLDIRECT 105.0 05/24/2019   LDLCALC 91 06/07/2023   ALT 18 06/07/2023   AST 28 06/07/2023   NA 137 04/04/2024   K 3.7 04/04/2024   CL 100 04/04/2024   CREATININE 0.76 04/04/2024   BUN 8 04/04/2024   CO2 29 04/04/2024   TSH 2.57 06/07/2023   INR 2.9 05/28/2024   HGBA1C 5.6 12/02/2022         Assessment & Plan:   Physical exam: Screening blood work  ordered Exercise  none - due to back, knee and shoulder pain Weight  obese, morbid Substance abuse  none   Reviewed recommended immunizations.   Discussed screening of heart disease-she has several risk factors-CT CAC ordered   Health Maintenance  Topic Date Due   Mammogram  09/08/2023   Influenza Vaccine  03/08/2024   COVID-19 Vaccine (10 - 2025-26  season) 06/23/2024 (Originally 04/08/2024)   DTaP/Tdap/Td (2 - Td or Tdap) 06/07/2025 (Originally 06/22/2022)   Medicare Annual Wellness (AWV)  09/07/2024   DEXA SCAN  03/28/2025   Colonoscopy  06/17/2027   Pneumococcal Vaccine: 50+ Years  Completed   Hepatitis C Screening  Completed   Meningococcal B Vaccine  Aged Out   Fecal DNA (Cologuard)  Discontinued   Zoster Vaccines- Shingrix  Discontinued          See Problem List for Assessment and Plan of chronic medical problems.

## 2024-06-06 NOTE — Patient Instructions (Addendum)
 Flu immunization administered today.     Blood work was ordered.       Medications changes include :   cream sent to pharmacy for rash.      A CT scan of your heart was ordered  - this will cost $99 and someone will call you to schedule an appointment.     Return in about 6 months (around 12/05/2024) for follow up.     Health Maintenance, Female Adopting a healthy lifestyle and getting preventive care are important in promoting health and wellness. Ask your health care provider about: The right schedule for you to have regular tests and exams. Things you can do on your own to prevent diseases and keep yourself healthy. What should I know about diet, weight, and exercise? Eat a healthy diet  Eat a diet that includes plenty of vegetables, fruits, low-fat dairy products, and lean protein. Do not eat a lot of foods that are high in solid fats, added sugars, or sodium. Maintain a healthy weight Body mass index (BMI) is used to identify weight problems. It estimates body fat based on height and weight. Your health care provider can help determine your BMI and help you achieve or maintain a healthy weight. Get regular exercise Get regular exercise. This is one of the most important things you can do for your health. Most adults should: Exercise for at least 150 minutes each week. The exercise should increase your heart rate and make you sweat (moderate-intensity exercise). Do strengthening exercises at least twice a week. This is in addition to the moderate-intensity exercise. Spend less time sitting. Even light physical activity can be beneficial. Watch cholesterol and blood lipids Have your blood tested for lipids and cholesterol at 69 years of age, then have this test every 5 years. Have your cholesterol levels checked more often if: Your lipid or cholesterol levels are high. You are older than 70 years of age. You are at high risk for heart disease. What should I know about  cancer screening? Depending on your health history and family history, you may need to have cancer screening at various ages. This may include screening for: Breast cancer. Cervical cancer. Colorectal cancer. Skin cancer. Lung cancer. What should I know about heart disease, diabetes, and high blood pressure? Blood pressure and heart disease High blood pressure causes heart disease and increases the risk of stroke. This is more likely to develop in people who have high blood pressure readings or are overweight. Have your blood pressure checked: Every 3-5 years if you are 66-68 years of age. Every year if you are 28 years old or older. Diabetes Have regular diabetes screenings. This checks your fasting blood sugar level. Have the screening done: Once every three years after age 13 if you are at a normal weight and have a low risk for diabetes. More often and at a younger age if you are overweight or have a high risk for diabetes. What should I know about preventing infection? Hepatitis B If you have a higher risk for hepatitis B, you should be screened for this virus. Talk with your health care provider to find out if you are at risk for hepatitis B infection. Hepatitis C Testing is recommended for: Everyone born from [birth year] through 1967. Anyone with known risk factors for hepatitis C. Sexually transmitted infections (STIs) Get screened for STIs, including gonorrhea and chlamydia, if: You are sexually active and are younger than 70 years of age. You are older than 70  years of age and your health care provider tells you that you are at risk for this type of infection. Your sexual activity has changed since you were last screened, and you are at increased risk for chlamydia or gonorrhea. Ask your health care provider if you are at risk. Ask your health care provider about whether you are at high risk for HIV. Your health care provider may recommend a prescription medicine to help prevent HIV  infection. If you choose to take medicine to prevent HIV, you should first get tested for HIV. You should then be tested every 3 months for as long as you are taking the medicine. Pregnancy If you are about to stop having your period (premenopausal) and you may become pregnant, seek counseling before you get pregnant. Take 400 to 800 micrograms (mcg) of folic acid every day if you become pregnant. Ask for birth control (contraception) if you want to prevent pregnancy. Osteoporosis and menopause Osteoporosis is a disease in which the bones lose minerals and strength with aging. This can result in bone fractures. If you are 33 years old or older, or if you are at risk for osteoporosis and fractures, ask your health care provider if you should: Be screened for bone loss. Take a calcium  or vitamin D  supplement to lower your risk of fractures. Be given hormone replacement therapy (HRT) to treat symptoms of menopause. Follow these instructions at home: Alcohol use Do not drink alcohol if: Your health care provider tells you not to drink. You are pregnant, may be pregnant, or are planning to become pregnant. If you drink alcohol: Limit how much you have to: 0-1 drink a day. Know how much alcohol is in your drink. In the [place] one drink equals one 12 oz bottle of beer (355 mL), one 5 oz glass of wine (148 mL), or one 1 oz glass of hard liquor (44 mL). Lifestyle Do not use any products that contain nicotine or tobacco. These products include cigarettes, chewing tobacco, and vaping devices, such as e-cigarettes. If you need help quitting, ask your health care provider. Do not use street drugs. Do not share needles. Ask your health care provider for help if you need support or information about quitting drugs. General instructions Schedule regular health, dental, and eye exams. Stay current with your vaccines. Tell your health care provider if: You often feel depressed. You have ever been abused  or do not feel safe at home. Summary Adopting a healthy lifestyle and getting preventive care are important in promoting health and wellness. Follow your health care provider's instructions about healthy diet, exercising, and getting tested or screened for diseases. Follow your health care provider's instructions on monitoring your cholesterol and blood pressure. This information is not intended to replace advice given to you by your health care provider. Make sure you discuss any questions you have with your health care provider. Document Revised: 12/14/2020 Document Reviewed: 12/14/2020 Elsevier Patient Education  2024 [employer].

## 2024-06-07 ENCOUNTER — Encounter: Payer: Self-pay | Admitting: Internal Medicine

## 2024-06-07 ENCOUNTER — Other Ambulatory Visit (HOSPITAL_COMMUNITY): Payer: Self-pay

## 2024-06-07 ENCOUNTER — Ambulatory Visit: Payer: Medicare HMO | Admitting: Internal Medicine

## 2024-06-07 ENCOUNTER — Other Ambulatory Visit: Payer: Self-pay

## 2024-06-07 VITALS — BP 116/68 | HR 70 | Temp 98.3°F | Ht 65.0 in | Wt 358.0 lb

## 2024-06-07 DIAGNOSIS — E559 Vitamin D deficiency, unspecified: Secondary | ICD-10-CM | POA: Diagnosis not present

## 2024-06-07 DIAGNOSIS — E7800 Pure hypercholesterolemia, unspecified: Secondary | ICD-10-CM

## 2024-06-07 DIAGNOSIS — E039 Hypothyroidism, unspecified: Secondary | ICD-10-CM

## 2024-06-07 DIAGNOSIS — E538 Deficiency of other specified B group vitamins: Secondary | ICD-10-CM | POA: Diagnosis not present

## 2024-06-07 DIAGNOSIS — N3946 Mixed incontinence: Secondary | ICD-10-CM | POA: Diagnosis not present

## 2024-06-07 DIAGNOSIS — Z0000 Encounter for general adult medical examination without abnormal findings: Secondary | ICD-10-CM | POA: Diagnosis not present

## 2024-06-07 DIAGNOSIS — F1120 Opioid dependence, uncomplicated: Secondary | ICD-10-CM

## 2024-06-07 DIAGNOSIS — I890 Lymphedema, not elsewhere classified: Secondary | ICD-10-CM

## 2024-06-07 DIAGNOSIS — Z136 Encounter for screening for cardiovascular disorders: Secondary | ICD-10-CM

## 2024-06-07 DIAGNOSIS — Z86711 Personal history of pulmonary embolism: Secondary | ICD-10-CM

## 2024-06-07 DIAGNOSIS — R739 Hyperglycemia, unspecified: Secondary | ICD-10-CM

## 2024-06-07 DIAGNOSIS — I951 Orthostatic hypotension: Secondary | ICD-10-CM

## 2024-06-07 DIAGNOSIS — I872 Venous insufficiency (chronic) (peripheral): Secondary | ICD-10-CM

## 2024-06-07 DIAGNOSIS — N3281 Overactive bladder: Secondary | ICD-10-CM

## 2024-06-07 DIAGNOSIS — B354 Tinea corporis: Secondary | ICD-10-CM

## 2024-06-07 DIAGNOSIS — K760 Fatty (change of) liver, not elsewhere classified: Secondary | ICD-10-CM

## 2024-06-07 DIAGNOSIS — Z23 Encounter for immunization: Secondary | ICD-10-CM | POA: Diagnosis not present

## 2024-06-07 LAB — TSH: TSH: 6.16 u[IU]/mL — ABNORMAL HIGH (ref 0.35–5.50)

## 2024-06-07 LAB — COMPREHENSIVE METABOLIC PANEL WITH GFR
ALT: 14 U/L (ref 0–35)
AST: 19 U/L (ref 0–37)
Albumin: 3.8 g/dL (ref 3.5–5.2)
Alkaline Phosphatase: 98 U/L (ref 39–117)
BUN: 11 mg/dL (ref 6–23)
CO2: 32 meq/L (ref 19–32)
Calcium: 9.3 mg/dL (ref 8.4–10.5)
Chloride: 100 meq/L (ref 96–112)
Creatinine, Ser: 0.72 mg/dL (ref 0.40–1.20)
GFR: 84.68 mL/min (ref >60.00)
Glucose, Bld: 82 mg/dL (ref 70–99)
Potassium: 4 meq/L (ref 3.5–5.1)
Sodium: 138 meq/L (ref 135–145)
Total Bilirubin: 0.6 mg/dL (ref 0.2–1.2)
Total Protein: 7.8 g/dL (ref 6.0–8.3)

## 2024-06-07 LAB — LIPID PANEL
Cholesterol: 112 mg/dL (ref 0–200)
HDL: 30.8 mg/dL — ABNORMAL LOW (ref >39.00)
LDL Cholesterol: 62 mg/dL (ref 0–99)
NonHDL: 81.61
Total CHOL/HDL Ratio: 4
Triglycerides: 97 mg/dL (ref 0.0–149.0)
VLDL: 19.4 mg/dL (ref 0.0–40.0)

## 2024-06-07 LAB — CBC
HCT: 38.8 % (ref 36.0–46.0)
Hemoglobin: 13 g/dL (ref 12.0–15.0)
MCHC: 33.5 g/dL (ref 30.0–36.0)
MCV: 85.5 fl (ref 78.0–100.0)
Platelets: 169 K/uL (ref 150.0–400.0)
RBC: 4.54 Mil/uL (ref 3.87–5.11)
RDW: 15.5 % (ref 11.5–15.5)
WBC: 5.7 K/uL (ref 4.0–10.5)

## 2024-06-07 LAB — VITAMIN B12: Vitamin B-12: 135 pg/mL — ABNORMAL LOW (ref 211–911)

## 2024-06-07 LAB — VITAMIN D 25 HYDROXY (VIT D DEFICIENCY, FRACTURES): VITD: 10.82 ng/mL — ABNORMAL LOW (ref 30.00–100.00)

## 2024-06-07 LAB — HEMOGLOBIN A1C: Hgb A1c MFr Bld: 5.8 % (ref 4.6–6.5)

## 2024-06-07 MED ORDER — BLADDER CONTROL PADS EXTRA PLS MISC: 11 refills | Status: AC

## 2024-06-07 MED ORDER — MEDICAL COMPRESSION SOCKS MISC
0 refills | Status: AC
Start: 2024-06-07 — End: ?

## 2024-06-07 MED ORDER — NYSTATIN-TRIAMCINOLONE 100000-0.1 UNIT/GM-% EX OINT
1.0000 | TOPICAL_OINTMENT | Freq: Two times a day (BID) | CUTANEOUS | 5 refills | Status: AC | PRN
  Filled 2024-06-07: qty 120, 60d supply, fill #0

## 2024-06-07 MED ORDER — LADY DIGNITY PLUS PANTIES MISC: 11 refills | Status: AC

## 2024-06-07 NOTE — Assessment & Plan Note (Signed)
 Chronic Regular exercise and healthy diet encouraged Check lipid panel  Currently lifestyle controlled, statin intolerant - defers statin  Lab Results  Component Value Date   LDLCALC 91 06/07/2023

## 2024-06-07 NOTE — Assessment & Plan Note (Addendum)
 Chronic  Using wedge pill at night to elevate legs Requires compression socks to prevent worsening edema and leg sores-she is lymphedema and this is medically necessary that she uses compression socks on a daily basis Not able to walk much due to chronic back and knee pain Not taking lasix  20 mg daily - causes too much urination

## 2024-06-07 NOTE — Assessment & Plan Note (Signed)
 Chronic Lifelong anticoagulation with Coumadin -monitored by our Coumadin  nurse-Shannon She checks her INR at home CBC, CMP

## 2024-06-07 NOTE — Assessment & Plan Note (Signed)
 Chronic Taking vitamin D daily Check vitamin D level

## 2024-06-07 NOTE — Assessment & Plan Note (Addendum)
 Chronic  With chronic lymphedema Tries to elevate them at night but legs down at night Not able to walk much Requires hospital bed to help elevate legs and prevent bed sores Stop taking Lasix  daily-May need to take this as needed

## 2024-06-07 NOTE — Assessment & Plan Note (Addendum)
 Chronic BMI 55.44 She knows weight loss would be helpful Decrease portions, as much exercise as possible She would benefit greatly from a GLP-1 CT CAC ordered for screening of heart disease

## 2024-06-07 NOTE — Assessment & Plan Note (Signed)
 Chronic Clinically euthyroid Check tsh and will titrate med dose if needed Currently taking levothyroxine 225 mcg daily

## 2024-06-07 NOTE — Assessment & Plan Note (Signed)
Chronic Taking vitamin B12 daily Check level

## 2024-06-08 ENCOUNTER — Encounter: Payer: Self-pay | Admitting: Internal Medicine

## 2024-06-09 ENCOUNTER — Ambulatory Visit: Payer: Self-pay | Admitting: Internal Medicine

## 2024-06-09 DIAGNOSIS — E039 Hypothyroidism, unspecified: Secondary | ICD-10-CM

## 2024-06-09 MED ORDER — LEVOTHYROXINE SODIUM 50 MCG PO TABS
50.0000 ug | ORAL_TABLET | Freq: Every day | ORAL | 3 refills | Status: AC
  Filled 2024-06-09: qty 90, 90d supply, fill #0
  Filled 2024-06-25 – 2024-09-04 (×2): qty 90, 90d supply, fill #1

## 2024-06-09 MED ORDER — VITAMIN D (ERGOCALCIFEROL) 1.25 MG (50000 UNIT) PO CAPS
50000.0000 [IU] | ORAL_CAPSULE | ORAL | 0 refills | Status: AC
  Filled 2024-06-09: qty 8, 56d supply, fill #0

## 2024-06-09 NOTE — Assessment & Plan Note (Signed)
 Chronic Hyperglycemia  Check A1c Continue weight loss efforts

## 2024-06-09 NOTE — Assessment & Plan Note (Signed)
 Chronic Hepatic steatosis seen on CT scan 09/24/2022 Would benefit greatly from weight loss, improve diet

## 2024-06-09 NOTE — Assessment & Plan Note (Signed)
 Chronic She has been experiencing postural hypotension Not currently on any blood pressure medications Discontinued furosemide  because of urinary frequency, incontinence Continue wearing compression socks Fall precautions discussed Good hydration discussed

## 2024-06-09 NOTE — Assessment & Plan Note (Signed)
 Chronic Stop taking Lasix  due to overactive bladder and mixed urinary incontinence Currently using bladder pads and incontinence panties, which are medically necessary given her degree of overactive bladder and incontinence Will provide prescriptions-hopefully this can be partially covered with insurance

## 2024-06-09 NOTE — Assessment & Plan Note (Signed)
 Chronic Bilateral under breasts-left side worse than right side Likely combination of intertrigo and yeast infection Nystatin -triamcinolone  ointment has been effective-renewed today

## 2024-06-09 NOTE — Assessment & Plan Note (Signed)
 Chronic With urinary incontinence Using bladder pads and incontinence panties, both of which medically necessary for because of the degree of her overactive bladder and mixed urinary incontinence

## 2024-06-09 NOTE — Assessment & Plan Note (Signed)
 Following with pain management for chronic back pain and knee pain On Dilaudid , methadone 

## 2024-06-10 ENCOUNTER — Other Ambulatory Visit (HOSPITAL_COMMUNITY): Payer: Self-pay

## 2024-06-10 ENCOUNTER — Other Ambulatory Visit: Payer: Self-pay

## 2024-06-11 ENCOUNTER — Ambulatory Visit (INDEPENDENT_AMBULATORY_CARE_PROVIDER_SITE_OTHER)

## 2024-06-11 ENCOUNTER — Other Ambulatory Visit (HOSPITAL_COMMUNITY): Payer: Self-pay

## 2024-06-11 DIAGNOSIS — Z7901 Long term (current) use of anticoagulants: Secondary | ICD-10-CM

## 2024-06-11 LAB — POCT INR: INR: 2.8 (ref 2.0–3.0)

## 2024-06-11 NOTE — Progress Notes (Signed)
 Pt tests at home. INR today is 2.8 Continue 1 tablet daily except take 1 1/2 tablets on Wednesday and Sunday. Recheck in 2 week, 11/18. LVM with dosing and recheck date.

## 2024-06-11 NOTE — Patient Instructions (Addendum)
 Pre visit review using our clinic review tool, if applicable. No additional management support is needed unless otherwise documented below in the visit note.  Continue 1 tablet daily except take 1 1/2 tablets on Wednesday and Sunday. Recheck in 2 week, 11/18.

## 2024-06-13 ENCOUNTER — Encounter: Payer: Self-pay | Admitting: Internal Medicine

## 2024-06-14 ENCOUNTER — Other Ambulatory Visit: Payer: Self-pay

## 2024-06-14 ENCOUNTER — Telehealth: Payer: Self-pay

## 2024-06-14 ENCOUNTER — Other Ambulatory Visit (HOSPITAL_COMMUNITY): Payer: Self-pay

## 2024-06-14 NOTE — Telephone Encounter (Signed)
 Pt LVM before business hours this morning reporting she picked up tumeric supplements and wants to know if it is ok to take with warfarin.  Tried to contact pt but had to LVM. Advised not to take any tumeric due to effect on a liver enzyme with processes warfarin. This will increase INR and bleeding risk.

## 2024-06-16 ENCOUNTER — Encounter: Payer: Self-pay | Admitting: Internal Medicine

## 2024-06-17 ENCOUNTER — Other Ambulatory Visit (HOSPITAL_COMMUNITY): Payer: Self-pay

## 2024-06-18 ENCOUNTER — Ambulatory Visit (HOSPITAL_COMMUNITY)
Admission: RE | Admit: 2024-06-18 | Discharge: 2024-06-18 | Disposition: A | Discharge status: Home / Self Care | Payer: Self-pay | Source: Ambulatory Visit | Attending: Internal Medicine | Admitting: Internal Medicine

## 2024-06-18 ENCOUNTER — Other Ambulatory Visit (HOSPITAL_COMMUNITY): Payer: Self-pay

## 2024-06-18 DIAGNOSIS — Z136 Encounter for screening for cardiovascular disorders: Secondary | ICD-10-CM | POA: Insufficient documentation

## 2024-06-18 MED ORDER — COMIRNATY 30 MCG/0.3ML IM SUSY
0.3000 mL | PREFILLED_SYRINGE | Freq: Once | INTRAMUSCULAR | 0 refills | Status: AC
  Filled 2024-06-18: qty 0.3, 1d supply, fill #0

## 2024-06-19 ENCOUNTER — Ambulatory Visit (INDEPENDENT_AMBULATORY_CARE_PROVIDER_SITE_OTHER)

## 2024-06-19 DIAGNOSIS — Z7901 Long term (current) use of anticoagulants: Secondary | ICD-10-CM | POA: Diagnosis not present

## 2024-06-19 DIAGNOSIS — Z86711 Personal history of pulmonary embolism: Secondary | ICD-10-CM | POA: Diagnosis not present

## 2024-06-19 LAB — POCT INR: INR: 2.2 (ref 2.0–3.0)

## 2024-06-19 NOTE — Progress Notes (Signed)
 Pt tests at home. INR today is 2.2 Continue 1 tablet daily except take 1 1/2 tablets on Wednesday and Sunday. Recheck in 1 week, 11/19. LVM with dosing and recheck date.

## 2024-06-19 NOTE — Progress Notes (Signed)
 I have reviewed and agree with note, evaluation, plan.   Garnette Lukes, MD

## 2024-06-19 NOTE — Patient Instructions (Addendum)
 Pre visit review using our clinic review tool, if applicable. No additional management support is needed unless otherwise documented below in the visit note.  Continue 1 tablet daily except take 1 1/2 tablets on Wednesday and Sunday. Recheck in 1 week, 11/19.

## 2024-06-21 ENCOUNTER — Encounter: Payer: Self-pay | Admitting: Internal Medicine

## 2024-06-21 DIAGNOSIS — I2510 Atherosclerotic heart disease of native coronary artery without angina pectoris: Secondary | ICD-10-CM | POA: Insufficient documentation

## 2024-06-21 DIAGNOSIS — I77810 Thoracic aortic ectasia: Secondary | ICD-10-CM | POA: Insufficient documentation

## 2024-06-24 ENCOUNTER — Encounter: Payer: Self-pay | Admitting: Internal Medicine

## 2024-06-25 ENCOUNTER — Other Ambulatory Visit: Payer: Self-pay | Admitting: Internal Medicine

## 2024-06-25 ENCOUNTER — Other Ambulatory Visit (HOSPITAL_COMMUNITY): Payer: Self-pay

## 2024-06-25 MED ORDER — METHADONE HCL 10 MG PO TABS
10.0000 mg | ORAL_TABLET | Freq: Three times a day (TID) | ORAL | 0 refills | Status: DC | PRN
  Filled 2024-06-28: qty 180, 30d supply, fill #0

## 2024-06-26 ENCOUNTER — Ambulatory Visit (INDEPENDENT_AMBULATORY_CARE_PROVIDER_SITE_OTHER): Payer: Self-pay

## 2024-06-26 ENCOUNTER — Other Ambulatory Visit: Payer: Self-pay

## 2024-06-26 ENCOUNTER — Other Ambulatory Visit (HOSPITAL_COMMUNITY): Payer: Self-pay

## 2024-06-26 DIAGNOSIS — Z7901 Long term (current) use of anticoagulants: Secondary | ICD-10-CM

## 2024-06-26 LAB — POCT INR: INR: 2.8 (ref 2.0–3.0)

## 2024-06-26 MED ORDER — LEVOTHYROXINE SODIUM 200 MCG PO TABS
200.0000 ug | ORAL_TABLET | Freq: Every day | ORAL | 1 refills | Status: AC
  Filled 2024-06-26: qty 90, 90d supply, fill #0
  Filled 2024-09-04: qty 90, 90d supply, fill #1

## 2024-06-26 NOTE — Progress Notes (Signed)
 Pt tests at home. INR today is 2.8 Continue 1 tablet daily except take 1 1/2 tablets on Wednesday and Sunday. Recheck in 2 week, 12/3. LVM with dosing and recheck date.

## 2024-06-26 NOTE — Patient Instructions (Addendum)
 Pre visit review using our clinic review tool, if applicable. No additional management support is needed unless otherwise documented below in the visit note.  Continue 1 tablet daily except take 1 1/2 tablets on Wednesday and Sunday. Recheck in 2 week, 12/3.

## 2024-06-26 NOTE — Progress Notes (Signed)
 I have reviewed and agree with note, evaluation, plan.   Garnette Lukes, MD

## 2024-06-27 ENCOUNTER — Other Ambulatory Visit (HOSPITAL_COMMUNITY): Payer: Self-pay

## 2024-06-27 MED ORDER — WEGOVY 0.25 MG/0.5ML ~~LOC~~ SOAJ
0.2500 mg | SUBCUTANEOUS | 0 refills | Status: DC
  Filled 2024-06-27: qty 2, 28d supply, fill #0

## 2024-06-28 ENCOUNTER — Other Ambulatory Visit (HOSPITAL_BASED_OUTPATIENT_CLINIC_OR_DEPARTMENT_OTHER): Payer: Self-pay

## 2024-06-28 ENCOUNTER — Other Ambulatory Visit (HOSPITAL_COMMUNITY): Payer: Self-pay

## 2024-07-02 ENCOUNTER — Encounter: Payer: Self-pay | Admitting: Internal Medicine

## 2024-07-09 ENCOUNTER — Encounter: Payer: Self-pay | Admitting: Internal Medicine

## 2024-07-10 ENCOUNTER — Telehealth: Payer: Self-pay

## 2024-07-10 ENCOUNTER — Ambulatory Visit: Payer: Self-pay

## 2024-07-10 ENCOUNTER — Other Ambulatory Visit (HOSPITAL_COMMUNITY): Payer: Self-pay

## 2024-07-10 DIAGNOSIS — Z7901 Long term (current) use of anticoagulants: Secondary | ICD-10-CM | POA: Diagnosis not present

## 2024-07-10 LAB — POCT INR: INR: 2.9 (ref 2.0–3.0)

## 2024-07-10 NOTE — Patient Instructions (Addendum)
 Pre visit review using our clinic review tool, if applicable. No additional management support is needed unless otherwise documented below in the visit note.  Continue 1 tablet daily except take 1 1/2 tablets on Wednesday and Sunday. Recheck in 2 week, 12/9.

## 2024-07-10 NOTE — Telephone Encounter (Signed)
 Pharmacy Patient Advocate Encounter   Received notification from Pt Calls Messages that prior authorization for Wegovy  0.25mg /0.33ml is required/requested.   Insurance verification completed.   The patient is insured through [place].   Per test claim: PA required; PA submitted to above mentioned insurance via Latent Key/confirmation #/EOC [id number] Status is pending

## 2024-07-10 NOTE — Progress Notes (Signed)
 Pt tests at home. INR today is 2.9 Continue 1 tablet daily except take 1 1/2 tablets on Wednesday and Sunday. Recheck in 2 week, 12/9. Pt returned call. Advised of dosing and recheck date. Pt verbalized understanding.

## 2024-07-11 ENCOUNTER — Other Ambulatory Visit: Payer: Self-pay

## 2024-07-11 ENCOUNTER — Other Ambulatory Visit (HOSPITAL_COMMUNITY): Payer: Self-pay

## 2024-07-11 NOTE — Telephone Encounter (Signed)
 Pharmacy Patient Advocate Encounter  Received notification from [employer] that Prior Authorization for Wegovy  0.25mg /0.44ml has been APPROVED from 07/11/24 to 08/07/25. Ran test claim, Copay is $0. This test claim was processed through [facility]- copay amounts may vary at other pharmacies due to pharmacy/plan contracts, or as the patient moves through the different stages of their insurance plan.   PA #/Case ID/Reference #: [id number]  Left a message at the pharmacy to notify of the approval.

## 2024-07-12 ENCOUNTER — Other Ambulatory Visit (HOSPITAL_COMMUNITY): Payer: Self-pay

## 2024-07-15 NOTE — Telephone Encounter (Signed)
 Pt LVM reporting she picked up Wegovy  script and it had a warfarin that it could interact with this medication.   No direct interaction with Wegovy  but slowing of the GI tract could prolong warfarin metabolism, which could increase INR.   Pt tests INR at home. Pt will start Wegovy  today. Advised pt to test on 12/10 and we will retest next week. Pt verbalized understanding.

## 2024-07-15 NOTE — Telephone Encounter (Signed)
 Approved.

## 2024-07-16 ENCOUNTER — Other Ambulatory Visit (HOSPITAL_COMMUNITY): Payer: Self-pay

## 2024-07-16 ENCOUNTER — Other Ambulatory Visit: Payer: Self-pay

## 2024-07-17 ENCOUNTER — Ambulatory Visit (INDEPENDENT_AMBULATORY_CARE_PROVIDER_SITE_OTHER)

## 2024-07-17 DIAGNOSIS — Z7901 Long term (current) use of anticoagulants: Secondary | ICD-10-CM | POA: Diagnosis not present

## 2024-07-17 LAB — POCT INR: INR: 3 (ref 2.0–3.0)

## 2024-07-17 NOTE — Progress Notes (Signed)
 Pt tests at home. INR today is 3.0 Pt started Wegovy  on 12/8. This does not have a direct interaction with warfarin but does slow digestion which can affect levels of warfarin.  Continue 1 tablet daily except take 1 1/2 tablets on Wednesday and Sunday. Recheck in 1 week, 12/16. Pt returned call. Advised of dosing and recheck date. Pt verbalized understanding.

## 2024-07-17 NOTE — Progress Notes (Signed)
 I have reviewed and agree with note, evaluation, plan.   Garnette Lukes, MD

## 2024-07-17 NOTE — Patient Instructions (Addendum)
 Pre visit review using our clinic review tool, if applicable. No additional management support is needed unless otherwise documented below in the visit note.  Continue 1 tablet daily except take 1 1/2 tablets on Wednesday and Sunday. Recheck in 1 week, 12/16.

## 2024-07-21 ENCOUNTER — Encounter: Payer: Self-pay | Admitting: Internal Medicine

## 2024-07-23 ENCOUNTER — Ambulatory Visit: Payer: Self-pay

## 2024-07-23 DIAGNOSIS — Z7901 Long term (current) use of anticoagulants: Secondary | ICD-10-CM

## 2024-07-23 LAB — POCT INR: INR: 3.2 — AB (ref 2.0–3.0)

## 2024-07-23 NOTE — Progress Notes (Signed)
 Pt tests at home. INR today is 3.2 Reduce dose today to take 1/2 tablet and then change weekly dose to take 1 tablet daily except take 1 1/2 tablets on Wednesday. Recheck in 1 week, 12/23. LVM with dosing and retest date.

## 2024-07-23 NOTE — Patient Instructions (Addendum)
 Pre visit review using our clinic review tool, if applicable. No additional management support is needed unless otherwise documented below in the visit note.  Reduce dose today to take 1/2 tablet and then change weekly dose to take 1 tablet daily except take 1 1/2 tablets on Wednesday. Recheck in 1 week, 12/23.

## 2024-07-24 ENCOUNTER — Other Ambulatory Visit (HOSPITAL_COMMUNITY): Payer: Self-pay

## 2024-07-24 MED ORDER — HYDROMORPHONE HCL 4 MG PO TABS: 4.0000 mg | ORAL_TABLET | Freq: Four times a day (QID) | ORAL | 0 refills | Status: AC | PRN

## 2024-07-24 MED ORDER — HYDROMORPHONE HCL 4 MG PO TABS
4.0000 mg | ORAL_TABLET | Freq: Four times a day (QID) | ORAL | 0 refills | Status: AC | PRN
  Filled 2024-08-15: qty 120, 30d supply, fill #0

## 2024-07-26 ENCOUNTER — Other Ambulatory Visit (HOSPITAL_COMMUNITY): Payer: Self-pay

## 2024-07-29 ENCOUNTER — Encounter: Payer: Self-pay | Admitting: Internal Medicine

## 2024-07-29 ENCOUNTER — Other Ambulatory Visit (HOSPITAL_BASED_OUTPATIENT_CLINIC_OR_DEPARTMENT_OTHER): Payer: Self-pay

## 2024-07-29 ENCOUNTER — Other Ambulatory Visit (HOSPITAL_COMMUNITY): Payer: Self-pay

## 2024-07-29 DIAGNOSIS — Z7901 Long term (current) use of anticoagulants: Secondary | ICD-10-CM

## 2024-07-29 MED ORDER — WARFARIN SODIUM 5 MG PO TABS
ORAL_TABLET | ORAL | 1 refills | Status: AC
  Filled 2024-08-18: qty 115, 90d supply, fill #0

## 2024-07-29 MED ORDER — METHADONE HCL 10 MG PO TABS
10.0000 mg | ORAL_TABLET | Freq: Three times a day (TID) | ORAL | 0 refills | Status: DC | PRN
  Filled 2024-07-29: qty 180, 30d supply, fill #0

## 2024-07-29 NOTE — Telephone Encounter (Signed)
 Pt is compliant with warfarin management and PCP apts.  Sent in refill of warfarin to requested pharmacy.

## 2024-07-30 ENCOUNTER — Other Ambulatory Visit (HOSPITAL_COMMUNITY): Payer: Self-pay

## 2024-07-30 ENCOUNTER — Ambulatory Visit (INDEPENDENT_AMBULATORY_CARE_PROVIDER_SITE_OTHER): Payer: Self-pay

## 2024-07-30 ENCOUNTER — Other Ambulatory Visit: Payer: Self-pay

## 2024-07-30 DIAGNOSIS — Z7901 Long term (current) use of anticoagulants: Secondary | ICD-10-CM

## 2024-07-30 LAB — POCT INR: INR: 2.4 (ref 2.0–3.0)

## 2024-07-30 MED ORDER — WEGOVY 0.25 MG/0.5ML ~~LOC~~ SOAJ
0.2500 mg | SUBCUTANEOUS | 0 refills | Status: DC
  Filled 2024-07-30 – 2024-08-03 (×2): qty 2, 28d supply, fill #0

## 2024-07-30 NOTE — Progress Notes (Signed)
 Pt tests at home. INR today is 2.4 Continue 1 tablet daily except take 1 1/2 tablets on Wednesday. Recheck in 2 week, 1/6. LVM with dosing and retest date.

## 2024-07-30 NOTE — Patient Instructions (Addendum)
 Pre visit review using our clinic review tool, if applicable. No additional management support is needed unless otherwise documented below in the visit note.  Continue 1 tablet daily except take 1 1/2 tablets on Wednesday. Recheck in 2 week, 1/6.

## 2024-08-01 ENCOUNTER — Encounter: Payer: Self-pay | Admitting: Internal Medicine

## 2024-08-03 ENCOUNTER — Other Ambulatory Visit: Payer: Self-pay | Admitting: Internal Medicine

## 2024-08-05 ENCOUNTER — Other Ambulatory Visit: Payer: Self-pay

## 2024-08-12 ENCOUNTER — Other Ambulatory Visit: Payer: Self-pay | Admitting: Internal Medicine

## 2024-08-12 ENCOUNTER — Ambulatory Visit: Payer: Self-pay

## 2024-08-12 ENCOUNTER — Other Ambulatory Visit (HOSPITAL_COMMUNITY): Payer: Self-pay

## 2024-08-12 MED ORDER — WEGOVY 0.25 MG/0.5ML ~~LOC~~ SOAJ
0.2500 mg | SUBCUTANEOUS | 0 refills | Status: AC
  Filled 2024-08-12 – 2024-08-26 (×2): qty 2, 28d supply, fill #0

## 2024-08-12 NOTE — Telephone Encounter (Signed)
 FYI Only or Action Required?: Action required by provider: booked outside dispo decline UC , booked with PCP 08/14/24 .  Patient was last seen in primary care on 06/07/2024 by Geofm Glade PARAS, MD.  Called Nurse Triage reporting Dysuria.  Symptoms began a week ago.  Interventions attempted: Rest, hydration, or home remedies.  Symptoms are: stable.  Triage Disposition: See HCP Within 4 Hours (Or PCP Triage)  Patient/caregiver understands and will follow disposition?: No, wishes to speak with PCP           Reason for Disposition  Has had heart valve replacement or joint replacement  Answer Assessment - Initial Assessment Questions Patient reports symptoms started 1 week ago, starts off as twinge burn and now its kind of orangey copper gold , drinking enough water. Has history displaced bladder. History hip replacement. Patient requesting appointment with PCP only , booked soonest appointment Wednesday 1/7 at 340 PM , patient is able to come in tomorrow if something opens up. Patient does not have transport to office outside of tueday and wesnesday , and only wants to see PCP senidng to office regarding scheduled appointment and wegovy  concerns is wondering if should inject again confirmed with patient initial injection did not enter body is holding injection for answer , pt did advise will go to ER if worsening urinary symptoms       1. SEVERITY: How bad is the pain?  (e.g., Scale 1-10; mild, moderate, or severe)     4/10 urination pain burning  2. FREQUENCY: How many times have you had painful urination today?      Not every time urinates, gets up every 2 hours to pee  3. PATTERN: Is pain present every time you urinate or just sometimes?      Not every time pees its painful at night 4. ONSET: When did the painful urination start?      1 week ago - 10 tens ago  5. FEVER: Do you have a fever? If Yes, ask: What is your temperature, how was it measured, and when did it  start?     Denies  6. PAST UTI: Have you had a urine infection before? If Yes, ask: When was the last time? and What happened that time?      History  of uti all kinds  7. CAUSE: What do you think is causing the painful urination?  (e.g., UTI, scratch, Herpes sore)     Unknown  8. OTHER SYMPTOMS: Do you have any other symptoms? (e.g., blood in urine, flank pain, genital sores, urgency, vaginal discharge)     Patient reports drinking lots of water, urinary frequency every 2 hours going Also mention when went to inject wegovy   , didn't go in squirted everywhere was told by pharmacist to call wegovy .  Patient denies the following chest pain, shortness of breath,  flank pain, fever, chills, inability to urinate , vomiting  Protocols used: Urination Pain - [facility] Copied from CRM #[id number]. Topic: Clinical - Red Word Triage >> Aug 12, 2024  4:19 PM Rea ORN wrote: Red Word that prompted transfer to Nurse Triage: possible UTI, pain with urination, urine is orange, burning, and pt has to bare down to urinate.

## 2024-08-13 ENCOUNTER — Other Ambulatory Visit (HOSPITAL_COMMUNITY): Payer: Self-pay

## 2024-08-13 ENCOUNTER — Ambulatory Visit (INDEPENDENT_AMBULATORY_CARE_PROVIDER_SITE_OTHER)

## 2024-08-13 ENCOUNTER — Telehealth: Payer: Self-pay

## 2024-08-13 DIAGNOSIS — Z7901 Long term (current) use of anticoagulants: Secondary | ICD-10-CM

## 2024-08-13 LAB — POCT INR: INR: 1.6 — AB (ref 2.0–3.0)

## 2024-08-13 NOTE — Telephone Encounter (Signed)
Patient coming in today for appointment 

## 2024-08-13 NOTE — Telephone Encounter (Signed)
 Copied from CRM [id number]. Topic: Clinical - Medical Advice >> Aug 12, 2024  4:10 PM Rea ORN wrote: Reason for CRM: Pt calling to speak with [facility]. Pt said one of her wegovy  pens is messed up and she was instructed to call PCP to let clinic know.   Please call back [phone]

## 2024-08-13 NOTE — Patient Instructions (Addendum)
 Pre visit review using our clinic review tool, if applicable. No additional management support is needed unless otherwise documented below in the visit note.  Increase dose today to take 1 1/2 tablets and increase dose tomorrow to take 2 tablets and then continue 1 tablet daily except take 1 1/2 tablets on Wednesday. Recheck in 2 week, 1/12.

## 2024-08-13 NOTE — Progress Notes (Signed)
 Pt tests at home. INR today is 1.6 Pt reports UTI symptoms. She contacted the office yesterday. She has apt with PCP tomorrow. Increase dose today to take 1 1/2 tablets and increase dose tomorrow to take 2 tablets and then continue 1 tablet daily except take 1 1/2 tablets on Wednesday. Recheck in 2 week, 1/12. Advised pt of dosing changes and recheck date. Pt verbalized understanding.

## 2024-08-14 ENCOUNTER — Other Ambulatory Visit: Payer: Self-pay

## 2024-08-14 ENCOUNTER — Ambulatory Visit (INDEPENDENT_AMBULATORY_CARE_PROVIDER_SITE_OTHER): Admitting: Internal Medicine

## 2024-08-14 ENCOUNTER — Other Ambulatory Visit (HOSPITAL_COMMUNITY): Payer: Self-pay

## 2024-08-14 ENCOUNTER — Encounter: Payer: Self-pay | Admitting: Internal Medicine

## 2024-08-14 VITALS — BP 118/64 | HR 63 | Temp 98.7°F | Ht 65.0 in | Wt 350.0 lb

## 2024-08-14 DIAGNOSIS — R300 Dysuria: Secondary | ICD-10-CM

## 2024-08-14 DIAGNOSIS — N3001 Acute cystitis with hematuria: Secondary | ICD-10-CM | POA: Diagnosis not present

## 2024-08-14 LAB — POC URINALSYSI DIPSTICK (AUTOMATED)
Bilirubin, UA: NEGATIVE
Glucose, UA: NEGATIVE
Ketones, UA: NEGATIVE
Nitrite, UA: NEGATIVE
Protein, UA: NEGATIVE
Spec Grav, UA: 1.015
Urobilinogen, UA: 1 U/dL
pH, UA: 6.5

## 2024-08-14 MED ORDER — PHENAZOPYRIDINE HCL 200 MG PO TABS
200.0000 mg | ORAL_TABLET | Freq: Three times a day (TID) | ORAL | 0 refills | Status: AC | PRN
  Filled 2024-08-14 – 2024-08-15 (×2): qty 6, 2d supply, fill #0

## 2024-08-14 MED ORDER — NITROFURANTOIN MONOHYD MACRO 100 MG PO CAPS
100.0000 mg | ORAL_CAPSULE | Freq: Two times a day (BID) | ORAL | 0 refills | Status: AC
  Filled 2024-08-14 – 2024-08-15 (×2): qty 14, 7d supply, fill #0

## 2024-08-14 NOTE — Patient Instructions (Addendum)
" ° °  You have a UTI.      Medications changes include :   nitrofurantoin  100 mg twice daily       Return if symptoms worsen or fail to improve.  "

## 2024-08-14 NOTE — Progress Notes (Signed)
 "   Subjective:    Patient ID: [name], female    DOB: 1956-05-29, 69 y.o.   MRN: [id number]      HPI [name] is here for  Chief Complaint  Patient presents with   Urinary Tract Infection    Cloudy urine and strong urine scent    Discussed the use of AI scribe software for clinical note transcription with the patient, who gave verbal consent to proceed.  History of Present Illness [name] is a 69 year old female with recurrent urinary tract infections who presents with urinary symptoms.  She has been experiencing urinary symptoms for the past ten days, including dysuria and a sensation described as 'zaps', which feels like electricity radiating from hip to hip and sometimes extending to her fingers. She also reports an unusual taste or smell of urine and feces in the back of her mouth. Increased frequency and urgency are noted, with urination occurring every two hours, and blood in the urine has been identified by a nurse. No fever is present, but she reports stomach pain. She manages her symptoms by drinking water and juice and sucking on suckers.  She is currently taking Wegovy  for weight management, which initially caused some issues but has since stabilized. A malfunction with her Wegovy  injection resulted in the medication spilling out, but she has received a replacement. She reports feeling tired after the injection but generally feels better the next day.  She mentions a significant weight loss of eight pounds due to reduced food intake, primarily consuming liquids. She is mindful of her fluid intake, purchasing gallons of water and using electrolyte solutions.       Medications and allergies reviewed with patient and updated if appropriate.  Medications Ordered Prior to Encounter[1]  Review of Systems  Constitutional:  Negative for fever.  Gastrointestinal:  Positive for abdominal pain. Negative for nausea.  Genitourinary:  Positive for dysuria, frequency,  hematuria and urgency.       Objective:   Vitals:   08/14/24 1557  BP: 118/64  Pulse: 63  Temp: 98.7 F (37.1 C)  SpO2: 94%   BP Readings from Last 3 Encounters:  08/14/24 118/64  06/07/24 116/68  04/04/24 (!) 142/88   Wt Readings from Last 3 Encounters:  08/14/24 (!) 350 lb (158.8 kg)  06/07/24 (!) 358 lb (162.4 kg)  04/04/24 (!) 304 lb (137.9 kg)   Body mass index is 58.24 kg/m.    Physical Exam Constitutional:      General: She is not in acute distress.    Appearance: Normal appearance. She is not ill-appearing.  HENT:     Head: Normocephalic.  Eyes:     Conjunctiva/sclera: Conjunctivae normal.  Abdominal:     General: There is no distension.     Palpations: Abdomen is soft.     Tenderness: There is abdominal tenderness (some lower abdominal - suprapubic tenderness). There is no right CVA tenderness, left CVA tenderness, guarding or rebound.  Skin:    General: Skin is warm and dry.  Neurological:     Mental Status: She is alert.            Assessment & Plan:    See Problem List for Assessment and Plan of chronic medical problems.         [1]  Current Outpatient Medications on File Prior to Visit  Medication Sig Dispense Refill   calcium  carbonate (OS-CAL) 1250 (500 Ca) MG chewable tablet Chew by mouth.  Elastic Bandages & Supports (MEDICAL COMPRESSION SOCKS) MISC UAD for Chronic lymphedema - medium compression 4 each 0   Glycerin-Hypromellose-PEG 400 (DRY EYE RELIEF DROPS) 0.2-0.2-1 % SOLN Place 1 drop into both eyes as needed (dry eyes).     HYDROmorphone  (DILAUDID ) 4 MG tablet Take 1 tablet (4 mg total) by mouth every 6 (six) hours as needed for breakthrough pain 120 tablet 0   [START ON 08/23/2024] HYDROmorphone  (DILAUDID ) 4 MG tablet Take 1 tablet (4 mg total) by mouth every 6 (six) hours as needed for breakthrough pain. 120 tablet 0   HYDROmorphone  (DILAUDID ) 4 MG tablet Take 1 tablet (4 mg total) by mouth every 6 (six) hours as needed  for breakthrough pain. 120 tablet 0   hyoscyamine  (LEVSIN ) 0.125 MG tablet Take 1 tablet by mouth 1-3 times a day as needed for intestinal spasms. SCHEDULE APPOINTMENT FOR ADDITIONAL REFILLS. 15 tablet 0   Incontinence Supply Disposable (BLADDER CONTROL PAD EXTRA PLUS) MISC UAD.   N39.46, N32.81 150 each 11   Incontinence Supply Disposable (LADY DIGNITY PLUS PANTIES) MISC Size  5X    UAD.   N39.46, N32.81 150 each 11   levothyroxine  (SYNTHROID ) 200 MCG tablet Take 1 tablet (200 mcg total) by mouth daily before breakfast. 90 tablet 1   levothyroxine  (SYNTHROID ) 50 MCG tablet Take 1 tablet (50 mcg total) by mouth daily. Take with 200 mcg for total of 250 mcg daily 90 tablet 3   methadone  (DOLOPHINE ) 10 MG tablet Take 1-2 tablets (10-20 mg total) by mouth every 8 (eight) hours. 180 tablet 0   methadone  (DOLOPHINE ) 10 MG tablet Take 1-2 tablets (10-20 mg total) by mouth every 8 (eight) hours as needed. 180 tablet 0   naloxone (NARCAN) nasal spray 4 mg/0.1 mL Place 1 spray into the nose once as needed (opioid overdose).     nystatin -triamcinolone  ointment (MYCOLOG) Apply 1 Application topically 2 (two) times daily as needed (irritation). 120 g 5   RABEprazole  (ACIPHEX ) 20 MG tablet Take 20 mg by mouth daily as needed (acid reflux).     semaglutide -weight management (WEGOVY ) 0.25 MG/0.5ML SOAJ SQ injection Inject 0.25 mg into the skin once a week. 2 mL 0   Tenapanor  HCl (IBSRELA ) 50 MG TABS Take one tablet (50 mg) by mouth daily. 60 tablet 1   warfarin (COUMADIN ) 5 MG tablet TAKE 1 TABLET BY MOUTH DAILY EXCEPT TAKE 1 1/2 TABLETS ON WEDNESDAYS OR AS DIRECTED BY ANTICOAGULATION CLINIC 115 tablet 1   No current facility-administered medications on file prior to visit.   "

## 2024-08-14 NOTE — Assessment & Plan Note (Signed)
 Acute Urine dip consistent with UTI Will send urine for culture Take macrobid  100 mg bid x 7 days, pyridium  200 mg tid prn  Take tylenol  if needed.   Increase your water intake.  Call if no improvement

## 2024-08-15 ENCOUNTER — Other Ambulatory Visit: Payer: Self-pay

## 2024-08-15 ENCOUNTER — Other Ambulatory Visit (HOSPITAL_COMMUNITY): Payer: Self-pay

## 2024-08-15 ENCOUNTER — Encounter: Payer: Self-pay | Admitting: Internal Medicine

## 2024-08-16 NOTE — Telephone Encounter (Signed)
 Review of chart reveals pt was placed on Macrobid  for UTI. Pt is also using pyridium  for symptoms. No interaction with pyridium  or warfarin.   LVM advising no interaction.

## 2024-08-18 ENCOUNTER — Ambulatory Visit: Payer: Self-pay | Admitting: Internal Medicine

## 2024-08-18 LAB — CULTURE, URINE COMPREHENSIVE

## 2024-08-19 ENCOUNTER — Ambulatory Visit: Payer: Self-pay

## 2024-08-19 ENCOUNTER — Other Ambulatory Visit (HOSPITAL_COMMUNITY): Payer: Self-pay

## 2024-08-19 ENCOUNTER — Other Ambulatory Visit: Payer: Self-pay

## 2024-08-19 ENCOUNTER — Encounter: Payer: Self-pay | Admitting: *Deleted

## 2024-08-19 DIAGNOSIS — Z7901 Long term (current) use of anticoagulants: Secondary | ICD-10-CM

## 2024-08-19 LAB — POCT INR: INR: 1.8 — AB (ref 2.0–3.0)

## 2024-08-19 NOTE — Progress Notes (Signed)
 Pt tests at home. INR today is 1.8 Pt was placed on Macrobid  on 1/9. No interaction with warfarin. Pt reports most symptoms have improved but she is still having urgency and incontinence. She said she will contact the office if she feels the UTI has not cleared after she is finished with the abx.  Increase dose today to take 1 1/2 tablets and then change weekly dose to take 1 tablet daily except take 1 1/2 tablets on Wednesday and Saturday. Recheck in 1 week, 1/19. Advised pt of dosing changes and recheck date. Pt verbalized understanding.

## 2024-08-19 NOTE — Patient Instructions (Addendum)
 Pre visit review using our clinic review tool, if applicable. No additional management support is needed unless otherwise documented below in the visit note.  Increase dose today to take 1 1/2 tablets and then change weekly dose to take 1 tablet daily except take 1 1/2 tablets on Wednesday and Saturday. Recheck in 1 week, 1/19.

## 2024-08-26 ENCOUNTER — Ambulatory Visit: Payer: Self-pay

## 2024-08-26 ENCOUNTER — Other Ambulatory Visit (HOSPITAL_COMMUNITY): Payer: Self-pay

## 2024-08-26 ENCOUNTER — Other Ambulatory Visit: Payer: Self-pay

## 2024-08-26 DIAGNOSIS — Z7901 Long term (current) use of anticoagulants: Secondary | ICD-10-CM | POA: Diagnosis not present

## 2024-08-26 LAB — POCT INR: INR: 1.6 — AB (ref 2.0–3.0)

## 2024-08-26 MED ORDER — METHADONE HCL 10 MG PO TABS
10.0000 mg | ORAL_TABLET | Freq: Three times a day (TID) | ORAL | 0 refills | Status: AC | PRN
  Filled 2024-08-27 (×2): qty 180, 30d supply, fill #0

## 2024-08-26 NOTE — Patient Instructions (Addendum)
 Pre visit review using our clinic review tool, if applicable. No additional management support is needed unless otherwise documented below in the visit note.  Increase dose today and tomorrow to take 1 1/2 tablets and then change weekly dose to take 1 tablet daily except take 1 1/2 tablets on Monday,  Wednesday and Saturday. Recheck in 1 week, 1/26.

## 2024-08-26 NOTE — Progress Notes (Signed)
 Indication: PE, DVT Pt tests at home. INR today is 1.6 Increase dose today and tomorrow to take 1 1/2 tablets and then change weekly dose to take 1 tablet daily except take 1 1/2 tablets on Monday,  Wednesday and Saturday. Recheck in 1 week, 1/26. Advised pt of dosing changes and recheck date. Pt verbalized understanding.

## 2024-08-27 ENCOUNTER — Other Ambulatory Visit (HOSPITAL_COMMUNITY): Payer: Self-pay

## 2024-08-28 ENCOUNTER — Other Ambulatory Visit (HOSPITAL_COMMUNITY): Payer: Self-pay

## 2024-08-29 ENCOUNTER — Encounter: Payer: Self-pay | Admitting: Internal Medicine

## 2024-09-03 ENCOUNTER — Ambulatory Visit: Payer: Self-pay

## 2024-09-03 DIAGNOSIS — Z7901 Long term (current) use of anticoagulants: Secondary | ICD-10-CM | POA: Diagnosis not present

## 2024-09-03 LAB — POCT INR: INR: 3.4 — AB (ref 2.0–3.0)

## 2024-09-03 NOTE — Patient Instructions (Addendum)
 Pre visit review using our clinic review tool, if applicable. No additional management support is needed unless otherwise documented below in the visit note.  Hold warfarin today and then change weekly dose to take 1 tablet daily except take 1 1/2 tablets on Monday and Saturday. Recheck in 1 week, 2/3.

## 2024-09-03 NOTE — Progress Notes (Signed)
 Indication: PE, DVT Pt tests at home. INR today is 3.4 Hold warfarin today and then change weekly dose to take 1 tablet daily except take 1 1/2 tablets on Monday and Saturday. Recheck in 1 week, 2/3. LVM with dosing and recheck date.

## 2024-09-04 ENCOUNTER — Other Ambulatory Visit: Payer: Self-pay

## 2024-09-09 ENCOUNTER — Ambulatory Visit: Payer: Self-pay

## 2024-09-09 DIAGNOSIS — Z7901 Long term (current) use of anticoagulants: Secondary | ICD-10-CM

## 2024-09-09 LAB — POCT INR: INR: 2.9 (ref 2.0–3.0)

## 2024-09-09 NOTE — Patient Instructions (Addendum)
 Pre visit review using our clinic review tool, if applicable. No additional management support is needed unless otherwise documented below in the visit note.  Continue 1 tablet daily except take 1 1/2 tablets on Monday and Saturday. Recheck in 1 week, 2/9.

## 2024-09-10 ENCOUNTER — Ambulatory Visit: Payer: Medicare HMO

## 2024-09-25 ENCOUNTER — Other Ambulatory Visit

## 2024-12-06 ENCOUNTER — Ambulatory Visit: Admitting: Internal Medicine
